# Patient Record
Sex: Female | Born: 1955 | Race: Black or African American | Hispanic: No | Smoking: Current every day smoker
Health system: Southern US, Community
[De-identification: ages and names within clinical notes are randomized; demographics above are authoritative.]

## PROBLEM LIST (undated history)

## (undated) DIAGNOSIS — E785 Hyperlipidemia, unspecified: Secondary | ICD-10-CM

## (undated) DIAGNOSIS — E119 Type 2 diabetes mellitus without complications: Secondary | ICD-10-CM

## (undated) DIAGNOSIS — N6019 Diffuse cystic mastopathy of unspecified breast: Secondary | ICD-10-CM

## (undated) DIAGNOSIS — H269 Unspecified cataract: Secondary | ICD-10-CM

## (undated) DIAGNOSIS — I10 Essential (primary) hypertension: Secondary | ICD-10-CM

## (undated) DIAGNOSIS — K219 Gastro-esophageal reflux disease without esophagitis: Secondary | ICD-10-CM

## (undated) DIAGNOSIS — M1990 Unspecified osteoarthritis, unspecified site: Secondary | ICD-10-CM

## (undated) DIAGNOSIS — J449 Chronic obstructive pulmonary disease, unspecified: Secondary | ICD-10-CM

## (undated) HISTORY — DX: Unspecified osteoarthritis, unspecified site: M19.90

## (undated) HISTORY — DX: Type 2 diabetes mellitus without complications: E11.9

## (undated) HISTORY — PX: CHOLECYSTECTOMY: SHX55

## (undated) HISTORY — DX: Chronic obstructive pulmonary disease, unspecified: J44.9

## (undated) HISTORY — PX: UTERINE FIBROID SURGERY: SHX826

## (undated) HISTORY — DX: Hyperlipidemia, unspecified: E78.5

## (undated) HISTORY — DX: Gastro-esophageal reflux disease without esophagitis: K21.9

## (undated) HISTORY — DX: Diffuse cystic mastopathy of unspecified breast: N60.19

## (undated) HISTORY — DX: Unspecified cataract: H26.9

## (undated) HISTORY — PX: GALLBLADDER SURGERY: SHX652

## (undated) HISTORY — PX: COLONOSCOPY: SHX174

## (undated) HISTORY — DX: Essential (primary) hypertension: I10

## (undated) HISTORY — PX: CATARACT EXTRACTION: SUR2

---

## 1998-01-04 ENCOUNTER — Other Ambulatory Visit: Admission: RE | Admit: 1998-01-04 | Discharge: 1998-01-04 | Payer: Self-pay | Admitting: Nephrology

## 1998-01-07 ENCOUNTER — Ambulatory Visit (HOSPITAL_COMMUNITY): Admission: RE | Admit: 1998-01-07 | Discharge: 1998-01-07 | Payer: Self-pay | Admitting: Nephrology

## 1998-01-18 ENCOUNTER — Other Ambulatory Visit: Admission: RE | Admit: 1998-01-18 | Discharge: 1998-01-18 | Payer: Self-pay | Admitting: Nephrology

## 1998-01-21 ENCOUNTER — Ambulatory Visit (HOSPITAL_COMMUNITY): Admission: RE | Admit: 1998-01-21 | Discharge: 1998-01-21 | Payer: Self-pay | Admitting: Nephrology

## 1998-03-01 ENCOUNTER — Ambulatory Visit: Admission: RE | Admit: 1998-03-01 | Discharge: 1998-03-01 | Payer: Self-pay | Admitting: Nephrology

## 1998-10-07 ENCOUNTER — Encounter: Payer: Self-pay | Admitting: Nephrology

## 1998-10-07 ENCOUNTER — Ambulatory Visit (HOSPITAL_COMMUNITY): Admission: RE | Admit: 1998-10-07 | Discharge: 1998-10-07 | Payer: Self-pay | Admitting: Nephrology

## 1999-04-21 ENCOUNTER — Ambulatory Visit (HOSPITAL_COMMUNITY): Admission: RE | Admit: 1999-04-21 | Discharge: 1999-04-21 | Payer: Self-pay | Admitting: Obstetrics and Gynecology

## 1999-04-21 ENCOUNTER — Encounter: Payer: Self-pay | Admitting: Obstetrics and Gynecology

## 1999-04-29 ENCOUNTER — Other Ambulatory Visit: Admission: RE | Admit: 1999-04-29 | Discharge: 1999-04-29 | Payer: Self-pay | Admitting: Obstetrics and Gynecology

## 2001-08-26 ENCOUNTER — Other Ambulatory Visit: Admission: RE | Admit: 2001-08-26 | Discharge: 2001-08-26 | Payer: Self-pay | Admitting: Obstetrics and Gynecology

## 2003-06-12 ENCOUNTER — Ambulatory Visit (HOSPITAL_COMMUNITY): Admission: RE | Admit: 2003-06-12 | Discharge: 2003-06-12 | Payer: Self-pay | Admitting: Obstetrics and Gynecology

## 2004-02-15 ENCOUNTER — Encounter: Admission: RE | Admit: 2004-02-15 | Discharge: 2004-02-15 | Payer: Self-pay | Admitting: Obstetrics and Gynecology

## 2004-02-22 ENCOUNTER — Encounter: Admission: RE | Admit: 2004-02-22 | Discharge: 2004-02-22 | Payer: Self-pay | Admitting: Obstetrics and Gynecology

## 2013-02-16 ENCOUNTER — Ambulatory Visit (INDEPENDENT_AMBULATORY_CARE_PROVIDER_SITE_OTHER): Payer: BC Managed Care – PPO | Admitting: Family Medicine

## 2013-02-16 VITALS — BP 124/78 | HR 94 | Temp 98.0°F | Resp 17 | Ht 66.0 in | Wt 200.0 lb

## 2013-02-16 DIAGNOSIS — R51 Headache: Secondary | ICD-10-CM

## 2013-02-16 DIAGNOSIS — E1165 Type 2 diabetes mellitus with hyperglycemia: Secondary | ICD-10-CM

## 2013-02-16 DIAGNOSIS — E118 Type 2 diabetes mellitus with unspecified complications: Secondary | ICD-10-CM

## 2013-02-16 DIAGNOSIS — R519 Headache, unspecified: Secondary | ICD-10-CM

## 2013-02-16 DIAGNOSIS — E119 Type 2 diabetes mellitus without complications: Secondary | ICD-10-CM | POA: Insufficient documentation

## 2013-02-16 DIAGNOSIS — H00013 Hordeolum externum right eye, unspecified eyelid: Secondary | ICD-10-CM

## 2013-02-16 DIAGNOSIS — H00019 Hordeolum externum unspecified eye, unspecified eyelid: Secondary | ICD-10-CM

## 2013-02-16 DIAGNOSIS — IMO0002 Reserved for concepts with insufficient information to code with codable children: Secondary | ICD-10-CM

## 2013-02-16 MED ORDER — BUTALBITAL-APAP-CAFFEINE 50-325-40 MG PO TABS: 1.0000 | ORAL_TABLET | Freq: Two times a day (BID) | ORAL | Status: DC | PRN

## 2013-02-16 MED ORDER — AMOXICILLIN 500 MG PO CAPS: 1000.0000 mg | ORAL_CAPSULE | Freq: Two times a day (BID) | ORAL | Status: DC

## 2013-02-16 NOTE — Progress Notes (Signed)
[facility] [address], [place] [address] [phone]  Date:  02/16/2013   Name:  Bailey [name]   DOB:  04-08-1956   MRN:  [id number]  PCP:  No PCP Per Patient    Chief Complaint: Eye Injury   History of Present Illness:  Bailey [name] is a 57 y.o. very pleasant female patient who presents with the following:  She is here today with right eye problem.  This same problem has occurred in the past- about a month or two ago, the pain in her lid went away but the bump remained. Yesterday, the bump in her right upper lid again became more painful and larger.   Vision is ok, she does wear glasses but does not have them with her today.   She does not have any crusting or discharge, except occasionally she will note slight blurred vision that she can blink away.   Left eye is ok.  She has a history of DM and HTN.    She is looking for a new PCP right now.  Her A1c was around 11 last time it was checked. She takes metformin 1gm BID.  She would like to start exercising more to improve her DM control.    She notes HA every other day for 3 or 4 months.  She has used [facility] powders for a couple of days, but usually just uses tylenol.  Sometimes the HA can be severe and difficult to get rid of, and are often on one side of her head.    There are no active problems to display for this patient.   Past Medical History  Diagnosis Date  . Diabetes mellitus without complication   . Hypertension     Past Surgical History  Procedure Laterality Date  . Cholecystectomy      History  Substance Use Topics  . Smoking status: Current Every Day Smoker -- 1.00 packs/day for 20 years    Types: Cigarettes  . Smokeless tobacco: Not on file  . Alcohol Use: No    History reviewed. No pertinent family history.  No Known Allergies  Medication list has been reviewed and updated.  No current outpatient prescriptions on file prior to visit.   No current  facility-administered medications on file prior to visit.    Review of Systems:  As per HPI- otherwise negative.   Physical Examination: Filed Vitals:   02/16/13 1053  BP: 124/78  Pulse: 94  Temp: 98 F (36.7 C)  Resp: 17   Filed Vitals:   02/16/13 1053  Height: 5\' 6"  (1.676 m)  Weight: 200 lb (90.719 kg)   Body mass index is 32.3 kg/(m^2). Ideal Body Weight: Weight in (lb) to have BMI = 25: 154.6  GEN: WDWN, NAD, Non-toxic, A & O x 3, overweight HEENT: Atraumatic, Normocephalic. Neck supple. No masses, No LAD. PEERL, EOMI, conjunctivae wnl,.  There is a tender bump on the mid upper lid, which appears consistent with a stye Ears and Nose: No external deformity. CV: RRR, No M/G/R. No JVD. No thrill. No extra heart sounds. PULM: CTA B, no wheezes, crackles, rhonchi. No retractions. No resp. distress. No accessory muscle use. ABD: S, NT, ND, +BS. No rebound. No HSM. EXTR: No c/c/e NEURO Normal gait. Normal UE and LE strength, sensation and DTR.  Normal  PSYCH: Normally interactive. Conversant. Not depressed or anxious appearing.  Calm demeanor.   Results for orders placed in visit on 02/16/13  POCT  GLYCOSYLATED HEMOGLOBIN (HGB A1C)      Result Value Range   Hemoglobin A1C 8.3      Assessment and Plan: Stye, right - Plan: amoxicillin (AMOXIL) 500 MG capsule  Type II or unspecified type diabetes mellitus with unspecified complication, uncontrolled - Plan: POCT glycosylated hemoglobin (Hb A1C)  Chronic headaches - Plan: butalbital-acetaminophen-caffeine (FIORICET, ESGIC) 50-325-40 MG per tablet  Will treat for persistent stye with oral amoxicillin and hot compresses.  If not better please RTC.    DM: her control is not at goal, but sounds like it is improved.  She will work on more exercise, recheck in 3 months.   Chronic HA; she can try fioricet on occasion as needed.    Signed Abbe Amsterdam, MD

## 2013-02-16 NOTE — Patient Instructions (Addendum)
We are going to treat you for a stye with hot compresses and an oral antibiotic.  Take the amoxicillin twice a day for 7- 10 days. If your stye is not better in the next 2 days please let us know- Sooner if worse.   Use the Fioricet as needed for headache.  Remember it can cause mild sedation so do not use it when you need to drive.  Also, it contains tylenol so do not take any additional tylenol along with this

## 2013-03-10 ENCOUNTER — Ambulatory Visit: Payer: BC Managed Care – PPO

## 2013-03-10 ENCOUNTER — Ambulatory Visit (INDEPENDENT_AMBULATORY_CARE_PROVIDER_SITE_OTHER): Payer: BC Managed Care – PPO | Admitting: Family Medicine

## 2013-03-10 VITALS — BP 145/86 | HR 98 | Temp 98.1°F | Resp 16 | Wt 198.4 lb

## 2013-03-10 DIAGNOSIS — M7702 Medial epicondylitis, left elbow: Secondary | ICD-10-CM

## 2013-03-10 DIAGNOSIS — M25529 Pain in unspecified elbow: Secondary | ICD-10-CM

## 2013-03-10 DIAGNOSIS — M25522 Pain in left elbow: Secondary | ICD-10-CM

## 2013-03-10 DIAGNOSIS — R519 Headache, unspecified: Secondary | ICD-10-CM

## 2013-03-10 MED ORDER — TRAMADOL HCL 50 MG PO TABS: 50.0000 mg | ORAL_TABLET | Freq: Three times a day (TID) | ORAL | Status: DC | PRN

## 2013-03-10 MED ORDER — BUTALBITAL-APAP-CAFFEINE 50-325-40 MG PO TABS: 1.0000 | ORAL_TABLET | Freq: Two times a day (BID) | ORAL | Status: DC | PRN

## 2013-03-10 MED ORDER — MELOXICAM 7.5 MG PO TABS: 15.0000 mg | ORAL_TABLET | Freq: Every day | ORAL | Status: DC

## 2013-03-10 NOTE — Progress Notes (Signed)
[facility]:  Office Visit  Chief Complaint:  Chief Complaint  Patient presents with  . Elbow Pain    unable to bend or move elbow    HPI: Bailey [name] is a 57 y.o. female who complains of  Left elbow pain x 2 days. Constant throbbing. NKI. She is right handed. No repetitive motion. 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 She has had a h.o carpal tunnel but not for a long time. She took her fioricet for HAs.  This helped some. No prior elbow injuries/similar sxs.   Prior PCP was Nicholos Johns  Past Medical History  Diagnosis Date  . Diabetes mellitus without complication   . Hypertension    Past Surgical History  Procedure Laterality Date  . Cholecystectomy     History   Social History  . Marital Status: Married    Spouse Name: N/A    Number of Children: N/A  . Years of Education: N/A   Social History Main Topics  . Smoking status: Current Every Day Smoker -- 1.00 packs/day for 20 years    Types: Cigarettes  . Smokeless tobacco: None  . Alcohol Use: No  . Drug Use: No  . Sexually Active: Yes    Birth Control/ Protection: None   Other Topics Concern  . None   Social History Narrative  . None   History reviewed. No pertinent family history. No Known Allergies Prior to Admission medications   Medication Sig Start Date End Date Taking? Authorizing Provider  lisinopril (PRINIVIL,ZESTRIL) 20 MG tablet Take 20 mg by mouth daily.   Yes Historical Provider, MD  metFORMIN (GLUCOPHAGE) 500 MG tablet Take 500 mg by mouth 2 (two) times daily with a meal.   Yes Historical Provider, MD  amoxicillin (AMOXIL) 500 MG capsule Take 2 capsules (1,000 mg total) by mouth 2 (two) times daily. 02/16/13   Pearline Cables, MD  butalbital-acetaminophen-caffeine (FIORICET, ESGIC) [phone] MG per tablet Take 1 tablet by mouth 2 (two) times daily as needed for headache. 02/16/13   Gwenlyn Found Copland, MD     ROS: The patient denies fevers,  chills, night sweats, unintentional weight loss, chest pain, palpitations, wheezing, dyspnea on exertion, nausea, vomiting, abdominal pain, dysuria, hematuria, melena, numbness, weakness, or tingling.  All other systems have been reviewed and were otherwise negative with the exception of those mentioned in the HPI and as above.    PHYSICAL EXAM: Filed Vitals:   03/10/13 0844  BP: 145/86  Pulse: 98  Temp: 98.1 F (36.7 C)  Resp: 16   Filed Vitals:   03/10/13 0844  Weight: 198 lb 6.4 oz (89.994 kg)   Body mass index is 32.04 kg/(m^2).  General: Alert, no acute distress HEENT:  Normocephalic, atraumatic, oropharynx patent.  Cardiovascular:  Regular rate and rhythm, no rubs murmurs or gallops.  No Carotid bruits, radial pulse intact. No pedal edema.  Respiratory: Clear to auscultation bilaterally.  No wheezes, rales, or rhonchi.  No cyanosis, no use of accessory musculature GI: No organomegaly, abdomen is soft and non-tender, positive bowel sounds.  No masses. Skin: No rashes. Neurologic: Facial musculature symmetric. Psychiatric: Patient is appropriate throughout our interaction. Lymphatic: No cervical lymphadenopathy Musculoskeletal: Gait intact. Neck and Left Shoulder exam normal + tender medial epicondyle, decrease ROM due to pain, + radial pulse, 5/5 strength, sensation intact  LABS: Results for orders placed in visit on 02/16/13  POCT GLYCOSYLATED HEMOGLOBIN (HGB A1C)  Result Value Range   Hemoglobin A1C 8.3       EKG/XRAY:   Primary read interpreted by Dr. Conley Rolls at [facility]. Neg fracture/dislocation   ASSESSMENT/PLAN: Encounter Diagnoses  Name Primary?  . Left elbow pain Yes  . Golfer's elbow, left   . Epicondylitis elbow, medial, left    No e/o infection/sepsis/or olecranon bursitis Most likely medial epicondylitis due to sprain/strain Rx mobic 7.5 mg Rx tramadol Patient did not feel good with sling so declined Refilled fioricet for HA Gross sideeffects,  risk and benefits, and alternatives of medications d/w patient. Patient is aware that all medications have potential sideeffects and we are unable to predict every sideeffect or drug-drug interaction that may occur. F/u prn   Jaydalee Bardwell PHUONG, DO 03/10/2013 9:46 AM

## 2013-03-10 NOTE — Patient Instructions (Signed)
Medial Epicondylitis (Golfer's Elbow)  with Rehab  Medial epicondylitis involves inflammation and pain around the inner (medial) portion of the elbow. This pain is caused by inflammation of the tendons in the forearm that flex (bring down) the wrist. Medial epicondylitis is also called golfer's elbow, because it is common among golfers. However, it may occur in any individual who flexes the wrist regularly. If medial epicondylitis is left untreated, it may become a chronic problem.  SYMPTOMS   · Pain, tenderness, or inflammation over the inner (medial) side of the elbow.  · Pain or weakness with gripping activities.  · Pain that increases with wrist twisting motions (using a screwdriver, playing golf, bowling).  CAUSES   Medial epicondylitis is caused by inflammation of the tendons that flex the wrist. Causes of injury may include:  · Chronic, repetitive stress and strain to the tendons that run from the wrist and forearm to the elbow.  · Sudden strain on the forearm, including wrist snap when serving balls with racquet sports, or throwing a baseball.  RISK INCREASES WITH:  · Sports or occupations that require repetitive and/or strenuous forearm and wrist movements (pitching a baseball, golfing, carpentry).  · Poor wrist and forearm strength and flexibility.  · Failure to warm up properly before activity.  · Resuming activity before healing, rehabilitation, and conditioning are complete.  PREVENTION   · Warm up and stretch properly before activity.  · Maintain physical fitness:  · Strength, flexibility, and endurance.  · Cardiovascular fitness.  · Wear and use properly fitted equipment.  · Learn and use proper technique and have a coach correct improper technique.  · Wear a tennis elbow (counterforce) brace.  PROGNOSIS   The course of this condition depends on the degree of the injury. If treated properly, acute cases (symptoms lasting less than 4 weeks) are often resolved in 2 to 6 weeks. Chronic (longer lasting  cases) often resolve in 3 to 6 months, but may require physical therapy.  RELATED COMPLICATIONS   · Frequently recurring symptoms, resulting in a chronic problem. Properly treating the problem the first time decreases frequency of recurrence.  · Chronic inflammation, scarring, and partial tendon tear, requiring surgery.  · Delayed healing or resolution of symptoms.  TREATMENT   Treatment first involves the use of ice and medicine, to reduce pain and inflammation. Strengthening and stretching exercises may reduce discomfort, if performed regularly. These exercises may be performed at home, if the condition is an acute injury. Chronic cases may require a referral to a physical therapist for evaluation and treatment. Your caregiver may advise a corticosteroid injection to help reduce inflammation. Rarely, surgery is needed.  MEDICATION  · If pain medicine is needed, nonsteroidal anti-inflammatory medicines (aspirin and ibuprofen), or other minor pain relievers (acetaminophen), are often advised.  · Do not take pain medicine for 7 days before surgery.  · Prescription pain relievers may be given, if your caregiver thinks they are needed. Use only as directed and only as much as you need.  · Corticosteroid injections may be recommended. These injections should be reserved only for the most severe cases, because they can only be given a certain number of times.  HEAT AND COLD  · Cold treatment (icing) should be applied for 10 to 15 minutes every 2 to 3 hours for inflammation and pain, and immediately after activity that aggravates your symptoms. Use ice packs or an ice massage.  · Heat treatment may be used before performing stretching and strengthening activities   prescribed by your caregiver, physical therapist, or athletic trainer. Use a heat pack or a warm water soak.  SEEK MEDICAL CARE IF:  Symptoms get worse or do not improve in 2 weeks, despite treatment.  EXERCISES   RANGE OF MOTION (ROM) AND STRETCHING EXERCISES -  Epicondylitis, Medial (Golfer's Elbow)  These exercises may help you when beginning to rehabilitate your injury. Your symptoms may go away with or without further involvement from your physician, physical therapist or athletic trainer. While completing these exercises, remember:   · Restoring tissue flexibility helps normal motion to return to the joints. This allows healthier, less painful movement and activity.  · An effective stretch should be held for at least 30 seconds.  · A stretch should never be painful. You should only feel a gentle lengthening or release in the stretched tissue.  RANGE OF MOTION  Wrist Flexion, Active-Assisted  · Extend your right / left elbow with your fingers pointing down.*  · Gently pull the back of your hand towards you, until you feel a gentle stretch on the top of your forearm.  · Hold this position for __________ seconds.  Repeat __________ times. Complete this exercise __________ times per day.   *If directed by your physician, physical therapist or athletic trainer, complete this stretch with your elbow bent, rather than extended.  RANGE OF MOTION  Wrist Extension, Active-Assisted  · Extend your right / left elbow and turn your palm upwards.*  · Gently pull your palm and fingertips back, so your wrist extends and your fingers point more toward the ground.  · You should feel a gentle stretch on the inside of your forearm.  · Hold this position for __________ seconds.  Repeat __________ times. Complete this exercise __________ times per day.  *If directed by your physician, physical therapist or athletic trainer, complete this stretch with your elbow bent, rather than extended.  STRETCH  Wrist Extension   · Place your right / left fingertips on a tabletop leaving your elbow slightly bent. Your fingers should point backwards.  · Gently press your fingers and palm down onto the table, by straightening your elbow. You should feel a stretch on the inside of your forearm.  · Hold this  position for __________ seconds.  Repeat __________ times. Complete this stretch __________ times per day.   STRENGTHENING EXERCISES - Epicondylitis, Medial (Golfer's Elbow)  These exercises may help you when beginning to rehabilitate your injury. They may resolve your symptoms with or without further involvement from your physician, physical therapist or athletic trainer. While completing these exercises, remember:   · Muscles can gain both the endurance and the strength needed for everyday activities through controlled exercises.  · Complete these exercises as instructed by your physician, physical therapist or athletic trainer. Increase the resistance and repetitions only as guided.  · You may experience muscle soreness or fatigue, but the pain or discomfort you are trying to eliminate should never worsen during these exercises. If this pain does get worse, stop and make sure you are following the directions exactly. If the pain is still present after adjustments, discontinue the exercise until you can discuss the trouble with your caregiver.  STRENGTH Wrist Flexors  · Sit with your right / left forearm palm-up, and fully supported on a table or countertop. Your elbow should be resting below the height of your shoulder. Allow your wrist to extend over the edge of the surface.  · Loosely holding a __________ weight, or a piece   of rubber exercise band or tubing, slowly curl your hand up toward your forearm.  · Hold this position for __________ seconds. Slowly lower the wrist back to the starting position in a controlled manner.  Repeat __________ times. Complete this exercise __________ times per day.   STRENGTH  Wrist Extensors  · Sit with your right / left forearm palm-down and fully supported. Your elbow should be resting below the height of your shoulder. Allow your wrist to extend over the edge of the surface.  · Loosely holding a __________ weight, or a piece of rubber exercise band or tubing, slowly curl  your hand up toward your forearm.  · Hold this position for __________ seconds. Slowly lower the wrist back to the starting position in a controlled manner.  Repeat __________ times. Complete this exercise __________ times per day.   STRENGTH - Ulnar Deviators  · Stand with a ____________________ weight in your right / left hand, or sit while holding a rubber exercise band or tubing, with your healthy arm supported on a table or countertop.  · Move your wrist so that your pinkie travels toward your forearm and your thumb moves away from your forearm.  · Hold this position for __________ seconds and then slowly lower the wrist back to the starting position.  Repeat __________ times. Complete this exercise __________ times per day  STRENGTH - Grip   · Grasp a tennis ball, a dense sponge, or a large, rolled sock in your hand.  · Squeeze as hard as you can, without increasing any pain.  · Hold this position for __________ seconds. Release your grip slowly.  Repeat __________ times. Complete this exercise __________ times per day.   STRENGTH  Forearm Supinators   · Sit with your right / left forearm supported on a table, keeping your elbow below shoulder height. Rest your hand over the edge, palm down.  · Gently grip a hammer or a soup ladle.  · Without moving your elbow, slowly turn your palm and hand upward to a "thumbs-up" position.  · Hold this position for __________ seconds. Slowly return to the starting position.  Repeat __________ times. Complete this exercise __________ times per day.   STRENGTH  Forearm Pronators  · Sit with your right / left forearm supported on a table, keeping your elbow below shoulder height. Rest your hand over the edge, palm up.  · Gently grip a hammer or a soup ladle.  · Without moving your elbow, slowly turn your palm and hand upward to a "thumbs-up" position.  · Hold this position for __________ seconds. Slowly return to the starting position.  Repeat __________ times. Complete this  exercise __________ times per day.   Document Released: 09/07/2005 Document Revised: 11/30/2011 Document Reviewed: 12/20/2008  [employer]® Patient Information ©2014 [employer].

## 2013-05-03 ENCOUNTER — Telehealth: Payer: Self-pay

## 2013-05-03 MED ORDER — METFORMIN HCL 500 MG PO TABS: 1000.0000 mg | ORAL_TABLET | Freq: Two times a day (BID) | ORAL | Status: DC

## 2013-05-03 NOTE — Telephone Encounter (Signed)
Bailey [name] is requesting a refill on pt's Metformin 500 mg #120.

## 2013-05-03 NOTE — Telephone Encounter (Signed)
Rx sent to pharmacy. Per note it looks like she takes 2 tablets twice daily which is what I refilled. Please verify this and let her know she will need an OV for additional refills

## 2013-05-04 NOTE — Telephone Encounter (Signed)
Called patient. Advised her she is due for follow up.

## 2013-05-11 ENCOUNTER — Ambulatory Visit: Payer: BC Managed Care – PPO

## 2013-05-11 ENCOUNTER — Ambulatory Visit (INDEPENDENT_AMBULATORY_CARE_PROVIDER_SITE_OTHER): Payer: BC Managed Care – PPO | Admitting: Physician Assistant

## 2013-05-11 VITALS — BP 144/84 | HR 112 | Temp 98.2°F | Resp 16 | Ht 65.5 in | Wt 194.0 lb

## 2013-05-11 DIAGNOSIS — J40 Bronchitis, not specified as acute or chronic: Secondary | ICD-10-CM

## 2013-05-11 DIAGNOSIS — E1059 Type 1 diabetes mellitus with other circulatory complications: Secondary | ICD-10-CM

## 2013-05-11 DIAGNOSIS — R05 Cough: Secondary | ICD-10-CM

## 2013-05-11 DIAGNOSIS — E119 Type 2 diabetes mellitus without complications: Secondary | ICD-10-CM

## 2013-05-11 LAB — POCT CBC
Granulocyte percent: 72.9 %G (ref 37–80)
HCT, POC: 43.1 % (ref 37.7–47.9)
Hemoglobin: 13.3 g/dL (ref 12.2–16.2)
Lymph, poc: 2.1 (ref 0.6–3.4)
MCHC: 30.9 g/dL — AB (ref 31.8–35.4)
MID (cbc): 0.7 (ref 0–0.9)
POC Granulocyte: 7.6 — AB (ref 2–6.9)
POC LYMPH PERCENT: 20.3 %L (ref 10–50)
POC MID %: 6.8 %M (ref 0–12)
Platelet Count, POC: 269 10*3/uL (ref 142–424)
RBC: 5.62 M/uL — AB (ref 4.04–5.48)
RDW, POC: 18.4 %

## 2013-05-11 LAB — GLUCOSE, POCT (MANUAL RESULT ENTRY): POC Glucose: 247 mg/dl — AB (ref 70–99)

## 2013-05-11 MED ORDER — AZITHROMYCIN 500 MG PO TABS: 500.0000 mg | ORAL_TABLET | Freq: Every day | ORAL | Status: DC

## 2013-05-11 MED ORDER — METFORMIN HCL 500 MG PO TABS: 1000.0000 mg | ORAL_TABLET | Freq: Two times a day (BID) | ORAL | Status: DC

## 2013-05-11 MED ORDER — HYDROCODONE-HOMATROPINE 5-1.5 MG/5ML PO SYRP: ORAL_SOLUTION | ORAL | Status: DC

## 2013-05-11 NOTE — Progress Notes (Signed)
Patient ID: EVADNE OSE MRN: [id number], DOB: 1956-01-17, 57 y.o. Date of Encounter: 05/11/2013, 11:42 AM  Primary Physician: No PCP Per Patient  Chief Complaint: Cough x 3 days  HPI: 57 y.o. female with history below presents with a cough x 3 days. Subjective fever and chills. Cough was initially more productive, however now it seems to be more dry. Cough is not associated with time of day. No associated SOB or wheezing. Mild nasal congestion. No sinus pressure or otalgia. Symptoms began with a one day history of sore throat, that has since resolved. She also notes a decreased appetite. Has been pushing fluids. Has eaten some watermelon over the past couple of days.    Past Medical History  Diagnosis Date  . Diabetes mellitus without complication   . Hypertension      Home Meds: Prior to Admission medications   Medication Sig Start Date End Date Taking? Authorizing Provider         butalbital-acetaminophen-caffeine (FIORICET, ESGIC) 50-325-40 MG per tablet Take 1 tablet by mouth 2 (two) times daily as needed for headache. 03/10/13  Yes Thao P Le, DO  lisinopril (PRINIVIL,ZESTRIL) 20 MG tablet Take 20 mg by mouth daily.   Yes Historical Provider, MD  meloxicam (MOBIC) 7.5 MG tablet Take 2 tablets (15 mg total) by mouth daily. Take with food. No other NSAIDs 03/10/13  Yes Thao P Le, DO  metFORMIN (GLUCOPHAGE) 500 MG tablet Take 2 tablets (1,000 mg total) by mouth 2 (two) times daily with a meal. Needs office visit 05/03/13  Yes Heather M Marte, PA-C  traMADol (ULTRAM) 50 MG tablet Take 1 tablet (50 mg total) by mouth every 8 (eight) hours as needed for pain. 03/10/13  Yes Thao P Le, DO    Allergies: No Known Allergies  History   Social History  . Marital Status: Married    Spouse Name: N/A    Number of Children: N/A  . Years of Education: N/A   Occupational History  . Not on file.   Social History Main Topics  . Smoking status: Current Every Day Smoker -- 1.00 packs/day  for 20 years    Types: Cigarettes  . Smokeless tobacco: Not on file  . Alcohol Use: No  . Drug Use: No  . Sexual Activity: Yes    Birth Control/ Protection: None   Other Topics Concern  . Not on file   Social History Narrative  . No narrative on file     Review of Systems: Constitutional: positive for fever, chills, and fatigue  HEENT: see above Cardiovascular: negative for chest pain or palpitations Respiratory: positive for cough. negative for hemoptysis, wheezing, or shortness of breath Abdominal: negative for abdominal pain, nausea, vomiting, diarrhea, or constipation Dermatological: negative for rash Neurologic: positive for dizziness. negative for headache or syncope   Physical Exam: Blood pressure 144/84, pulse 112, temperature 98.2 F (36.8 C), temperature source Oral, resp. rate 16, height 5' 5.5" (1.664 m), weight 194 lb (87.998 kg), SpO2 94.00%., Body mass index is 31.78 kg/(m^2). General: Well developed, well nourished, in no acute distress. Head: Normocephalic, atraumatic, eyes without discharge, sclera non-icteric, nares are without discharge. Bilateral auditory canals clear, TM's are without perforation, pearly grey and translucent with reflective cone of light bilaterally. Oral cavity moist, posterior pharynx without exudate, erythema, peritonsillar abscess, or post nasal drip.  Neck: Supple. No thyromegaly. Full ROM. No lymphadenopathy. Lungs: Coarse breath sounds bilaterally without wheezes, rales, or rhonchi. Breathing is unlabored. Heart: RRR with S1  S2. No murmurs, rubs, or gallops appreciated. Msk:  Strength and tone normal for age. Extremities/Skin: Warm and dry. No clubbing or cyanosis. No edema. No rashes or suspicious lesions. Neuro: Alert and oriented X 3. Moves all extremities spontaneously. Gait is normal. CNII-XII grossly in tact. Psych:  Responds to questions appropriately with a normal affect.   Labs: Results for orders placed in visit on  05/11/13  POCT CBC      Result Value Range   WBC 10.4 (*) 4.6 - 10.2 K/uL   Lymph, poc 2.1  0.6 - 3.4   POC LYMPH PERCENT 20.3  10 - 50 %L   MID (cbc) 0.7  0 - 0.9   POC MID % 6.8  0 - 12 %M   POC Granulocyte 7.6 (*) 2 - 6.9   Granulocyte percent 72.9  37 - 80 %G   RBC 5.62 (*) 4.04 - 5.48 M/uL   Hemoglobin 13.3  12.2 - 16.2 g/dL   HCT, POC 60.4  54.0 - 47.9 %   MCV 76.7 (*) 80 - 97 fL   MCH, POC 23.7 (*) 27 - 31.2 pg   MCHC 30.9 (*) 31.8 - 35.4 g/dL   RDW, POC 98.1     Platelet Count, POC 269  142 - 424 K/uL   MPV 9.7  0 - 99.8 fL  GLUCOSE, POCT (MANUAL RESULT ENTRY)      Result Value Range   POC Glucose 247 (*) 70 - 99 mg/dl  POCT GLYCOSYLATED HEMOGLOBIN (HGB A1C)      Result Value Range   Hemoglobin A1C 7.9     Prior A1C: 8.3 on 02/16/13  CXR: [facility] reading (PRIMARY) by  Dr. Perrin Maltese. Increased markings without discrete infiltrate.    ASSESSMENT AND PLAN:  56 y.o. female with bronchitis/cough and diabetes mellitus  1) Bronchitis/cough -Azithromycin 500 mg 1 po daily #5 no RF -Hycodan #4oz 1 tsp po q 4-6 hours prn cough no RF SED -Rest -Fluids  2) Diabetes mellitus -Continue metformin 1000 mg 1 po bid #120 RF 2 -Healthy diet and exercise -Weight loss   Signed, Eula Listen, PA-C 05/11/2013 11:42 AM

## 2013-07-22 ENCOUNTER — Ambulatory Visit: Payer: BC Managed Care – PPO

## 2013-07-22 ENCOUNTER — Ambulatory Visit (INDEPENDENT_AMBULATORY_CARE_PROVIDER_SITE_OTHER): Payer: BC Managed Care – PPO | Admitting: Emergency Medicine

## 2013-07-22 VITALS — BP 158/82 | HR 102 | Temp 97.6°F | Resp 20 | Ht 65.5 in | Wt 194.0 lb

## 2013-07-22 DIAGNOSIS — M25551 Pain in right hip: Secondary | ICD-10-CM

## 2013-07-22 DIAGNOSIS — M7700 Medial epicondylitis, unspecified elbow: Secondary | ICD-10-CM

## 2013-07-22 DIAGNOSIS — M549 Dorsalgia, unspecified: Secondary | ICD-10-CM

## 2013-07-22 DIAGNOSIS — M25559 Pain in unspecified hip: Secondary | ICD-10-CM

## 2013-07-22 DIAGNOSIS — M7702 Medial epicondylitis, left elbow: Secondary | ICD-10-CM

## 2013-07-22 MED ORDER — TRAMADOL HCL 50 MG PO TABS: 50.0000 mg | ORAL_TABLET | Freq: Three times a day (TID) | ORAL | Status: DC | PRN

## 2013-07-22 MED ORDER — MELOXICAM 7.5 MG PO TABS: 15.0000 mg | ORAL_TABLET | Freq: Every day | ORAL | Status: DC

## 2013-07-22 NOTE — Progress Notes (Signed)
This chart was scribed for Collene Gobble, MD by Caryn Bee, Medical Scribe. This patient was seen in Room/bed 03 and the patient's care was started at 10:11 AM.  Subjective:    Patient ID: Bailey [name], female    DOB: May 20, 1956, 57 y.o.   MRN: [id number]  HPI HPI Comments: Bailey [name] is a 57 y.o. female who presents to [facility] complaining of sudden onset moderate right hip pain that radiates to her neck and back, onset about 2 days ago. 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 She denies any recent injury to the hip. Pt reports lower back pain radiated from her hip. She also reports right leg pain associated with the hip pain. She has taken OTC tylenol with mild relief. She has h/o similar symptoms to the left hip. 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      Review of Systems  Musculoskeletal: Positive for arthralgias (Right hip), back pain (Lower back) and neck pain.   Past Surgical History  Procedure Laterality Date   Cholecystectomy     History   Social History   Marital Status: Married    Spouse Name: N/A    Number of Children: N/A   Years of Education: N/A   Occupational History   Not on file.   Social History Main Topics   Smoking status: Current Every Day Smoker -- 1.00 packs/day for 20 years    Types: Cigarettes   Smokeless tobacco: Not on file   Alcohol Use: No   Drug Use: No   Sexual Activity: Yes    Birth Control/ Protection: None   Other Topics Concern   Not on file   Social History Narrative   No narrative on file   Past Medical History  Diagnosis Date   Diabetes mellitus without complication    Hypertension    History reviewed. No pertinent family history. No Known Allergies      Objective:   Physical Exam there is tenderness over the back of the neck. There is tenderness to underside of the jaw and right trapezius area. She has normal strength of the right upper extremity.  Chest clear heart rate no murmurs examination of the right heel reveals no definite tenderness over the greater trochanter. There is tenderness over the buttocks and right L5-S1 area.    [facility] reading (PRIMARY) by  Dr. Cleta Alberts there is C5-6 C6-7 degenerative disc disease. LS-spine films show L5-S1 facet arthropathy. The right hip itself looks normal      Assessment & Plan:  Patient placed on nonsteroidals as well as Ultram. Referral made to [facility].

## 2013-09-26 ENCOUNTER — Other Ambulatory Visit: Payer: Self-pay | Admitting: Physician Assistant

## 2013-09-26 ENCOUNTER — Ambulatory Visit (INDEPENDENT_AMBULATORY_CARE_PROVIDER_SITE_OTHER): Payer: BC Managed Care – PPO | Admitting: Emergency Medicine

## 2013-09-26 ENCOUNTER — Other Ambulatory Visit: Payer: Self-pay | Admitting: Emergency Medicine

## 2013-09-26 ENCOUNTER — Ambulatory Visit
Admission: RE | Admit: 2013-09-26 | Discharge: 2013-09-26 | Disposition: A | Discharge status: Home / Self Care | Payer: BC Managed Care – PPO | Source: Ambulatory Visit | Attending: Emergency Medicine | Admitting: Emergency Medicine

## 2013-09-26 VITALS — BP 126/80 | HR 102 | Temp 98.1°F | Resp 16 | Ht 65.75 in | Wt 197.0 lb

## 2013-09-26 DIAGNOSIS — H5711 Ocular pain, right eye: Secondary | ICD-10-CM

## 2013-09-26 DIAGNOSIS — H5710 Ocular pain, unspecified eye: Secondary | ICD-10-CM

## 2013-09-26 DIAGNOSIS — R519 Headache, unspecified: Secondary | ICD-10-CM

## 2013-09-26 DIAGNOSIS — R51 Headache: Secondary | ICD-10-CM

## 2013-09-26 DIAGNOSIS — E119 Type 2 diabetes mellitus without complications: Secondary | ICD-10-CM

## 2013-09-26 LAB — POCT CBC
Granulocyte percent: 52.6 %G (ref 37–80)
HCT, POC: 45.3 % (ref 37.7–47.9)
HEMOGLOBIN: 14 g/dL (ref 12.2–16.2)
Lymph, poc: 3.4 (ref 0.6–3.4)
MCH: 23.2 pg — AB (ref 27–31.2)
MCHC: 30.9 g/dL — AB (ref 31.8–35.4)
MCV: 75 fL — AB (ref 80–97)
MID (cbc): 0.5 (ref 0–0.9)
MPV: 9.4 fL (ref 0–99.8)
PLATELET COUNT, POC: 299 10*3/uL (ref 142–424)
POC Granulocyte: 4.3 (ref 2–6.9)
POC LYMPH PERCENT: 41.8 %L (ref 10–50)
POC MID %: 5.6 %M (ref 0–12)
RBC: 6.04 M/uL — AB (ref 4.04–5.48)
RDW, POC: 18.3 %
WBC: 8.2 10*3/uL (ref 4.6–10.2)

## 2013-09-26 LAB — COMPREHENSIVE METABOLIC PANEL
ALBUMIN: 4.2 g/dL (ref 3.5–5.2)
ALT: 26 U/L (ref 0–35)
AST: 19 U/L (ref 0–37)
Alkaline Phosphatase: 125 U/L — ABNORMAL HIGH (ref 39–117)
BUN: 9 mg/dL (ref 6–23)
CALCIUM: 9.6 mg/dL (ref 8.4–10.5)
CHLORIDE: 100 meq/L (ref 96–112)
CO2: 22 meq/L (ref 19–32)
Creat: 0.43 mg/dL — ABNORMAL LOW (ref 0.50–1.10)
Glucose, Bld: 240 mg/dL — ABNORMAL HIGH (ref 70–99)
POTASSIUM: 4.3 meq/L (ref 3.5–5.3)
SODIUM: 134 meq/L — AB (ref 135–145)
Total Bilirubin: 0.4 mg/dL (ref 0.3–1.2)
Total Protein: 7.4 g/dL (ref 6.0–8.3)

## 2013-09-26 LAB — POCT GLYCOSYLATED HEMOGLOBIN (HGB A1C): Hemoglobin A1C: 8.2

## 2013-09-26 LAB — LIPID PANEL
CHOL/HDL RATIO: 5.1 ratio
CHOLESTEROL: 306 mg/dL — AB (ref 0–200)
HDL: 60 mg/dL (ref >39)
LDL CALC: 212 mg/dL — AB (ref 0–99)
TRIGLYCERIDES: 172 mg/dL — AB (ref <150)
VLDL: 34 mg/dL (ref 0–40)

## 2013-09-26 MED ORDER — ACETAMINOPHEN-CODEINE #3 300-30 MG PO TABS
1.0000 | ORAL_TABLET | ORAL | Status: DC | PRN
Start: 2013-09-26 — End: 2013-12-04

## 2013-09-26 MED ORDER — LISINOPRIL 20 MG PO TABS: 20.0000 mg | ORAL_TABLET | Freq: Every day | ORAL | Status: DC

## 2013-09-26 MED ORDER — METFORMIN HCL 500 MG PO TABS: 1000.0000 mg | ORAL_TABLET | Freq: Two times a day (BID) | ORAL | Status: DC

## 2013-09-26 MED ORDER — BUTALBITAL-APAP-CAFFEINE 50-325-40 MG PO TABS: 1.0000 | ORAL_TABLET | Freq: Two times a day (BID) | ORAL | Status: DC | PRN

## 2013-09-26 NOTE — Addendum Note (Signed)
Addended by: Roselee Culver on: 09/26/2013 09:43 AM   Modules accepted: Orders

## 2013-09-26 NOTE — Progress Notes (Signed)
[facility] [address] [place] [address] [phone]  Date:  09/26/2013   Name:  Bailey [name]   DOB:  02-13-1956   MRN:  [id number]  PCP:  Lamar Blinks, MD    Chief Complaint: Medication Refill, Headache and Nausea   History of Present Illness:  Bailey [name] is a 58 y.o. very pleasant female patient who presents with the following:  Patient describes a pain behind her right eye since May that has been persistent. Not associated with visual or neuro symptoms.  Says she has a bit of unsteadiness that she attributes to her diabetic neuropathy.  Went to ophthalmologist in May about her eye and he ruled out ophthalmic pathology.  No cough or coryza.  No swelling or conjunctival injection or drainage.  Vision stable with glasses.  No improvement with over the counter medications or other home remedies. Denies other complaint or health concern today.   Patient Active Problem List   Diagnosis Date Noted  . Type II or unspecified type diabetes mellitus with unspecified complication, uncontrolled 02/16/2013    Past Medical History  Diagnosis Date  . Diabetes mellitus without complication   . Hypertension     Past Surgical History  Procedure Laterality Date  . Cholecystectomy      History  Substance Use Topics  . Smoking status: Current Every Day Smoker -- 1.00 packs/day for 20 years    Types: Cigarettes  . Smokeless tobacco: Not on file  . Alcohol Use: No    History reviewed. No pertinent family history.  No Known Allergies  Medication list has been reviewed and updated.  Current Outpatient Prescriptions on File Prior to Visit  Medication Sig Dispense Refill  . HYDROcodone-homatropine (HYCODAN) 5-1.5 MG/5ML syrup 1 TSP PO Q 4-6 HOURS PRN COUGH  120 mL  0  . metFORMIN (GLUCOPHAGE) 500 MG tablet Take 2 tablets (1,000 mg total) by mouth 2 (two) times daily with a meal.  120 tablet  2  . traMADol (ULTRAM) 50 MG tablet Take 1 tablet (50 mg  total) by mouth every 8 (eight) hours as needed for pain.  30 tablet  0  . azithromycin (ZITHROMAX) 500 MG tablet Take 1 tablet (500 mg total) by mouth daily.  5 tablet  0  . butalbital-acetaminophen-caffeine (FIORICET, ESGIC) 50-325-40 MG per tablet Take 1 tablet by mouth 2 (two) times daily as needed for headache.  20 tablet  0  . lisinopril (PRINIVIL,ZESTRIL) 20 MG tablet Take 20 mg by mouth daily.      . meloxicam (MOBIC) 7.5 MG tablet Take 2 tablets (15 mg total) by mouth daily. Take with food. No other NSAIDs  20 tablet  0   No current facility-administered medications on file prior to visit.    Review of Systems:  As per HPI, otherwise negative.    Physical Examination: Filed Vitals:   09/26/13 0845  BP: 126/80  Pulse: 102  Temp: 98.1 F (36.7 C)  Resp: 16   Filed Vitals:   09/26/13 0845  Height: 5' 5.75" (1.67 m)  Weight: 197 lb (89.359 kg)   Body mass index is 32.04 kg/(m^2). Ideal Body Weight: Weight in (lb) to have BMI = 25: 153.4  GEN: WDWN, NAD, Non-toxic, A & O x 3 HEENT: Atraumatic, Normocephalic. Neck supple. No masses, No LAD.  PRRERLA EOMI pinpoint pupils.  Can't see eyegrounds Ears and Nose: No external deformity. CV: RRR, No M/G/R. No JVD. No thrill. No extra heart sounds. PULM:  CTA B, no wheezes, crackles, rhonchi. No retractions. No resp. distress. No accessory muscle use. ABD: S, NT, ND, +BS. No rebound. No HSM. EXTR: No c/c/e  CN 2-12 intact NEURO Normal gait.  PSYCH: Normally interactive. Conversant. Not depressed or anxious appearing.  Calm demeanor.    Assessment and Plan: Retro-orbital pain  CT  Signed,  Ellison Carwin, MD

## 2013-09-26 NOTE — Addendum Note (Signed)
Addended by: Roselee Culver on: 09/26/2013 10:11 AM   Modules accepted: Orders

## 2013-09-26 NOTE — Patient Instructions (Signed)
Diabetic Nephropathy Diabetic nephropathy is a complication of diabetes that leads to damaged kidneys. It develops slowly. The function of healthy kidneys is to filter and clean blood. Kidneys also get rid of body waste products and extra fluid. When the kidney filters are damaged, there is protein loss in the urine, a decline in kidney function, a buildup of kidney waste products and fluid, and high blood pressure. The damage progresses until the kidneys fail.  RISK FACTORS  High blood pressure (hypertension).  High blood sugar (hyperglycemia).  Family history.  Aging.  Obstruction problems affecting the kidneys, the tubes that drain the kidneys (ureters), or the bladder.  Taking certain drugs or medicines. SYMPTOMS  Symptoms may not be seen or felt for many years. You may not notice any signs of kidney failure until your kidneys have lost much of their ability to function. An early sign of damage is when small amounts of protein (albumin) leak into the urine. However, this can only be found through a urine test. Without physical symptoms, a urine test is often not performed. When the kidneys fail, you may feel one or more of the following:  Swelling of the hands and feet from the extra fluid in your body.  Constant upset stomach.  Constant fatigue. DIAGNOSIS When someone has diabetes, screening tests are done to look for any early signs of problems before symptoms develop and before damage has already been done. These tests may include:   Annual urine tests to screen for trace amounts of protein in the urine (microalbuminuria).  Urine collectionover 24 hours to measure kidney function.  Blood tests that measure kidney function. Your caregiver is aware that problems other than diabetes can damage kidneys. If screening tests show early kidney damage, but it is thought that a different problem is causing the damage, other tests may be performed. Examples of these tests include:  An  ultrasound of your kidney.  Taking a tissue sample (biopsy) from the kidney. TREATMENT The goal of treatment is to prevent or slow down damage to your kidneys. Controlling hypertension and hyperglycemia is critical. Your goal is to maintain a blood pressure below 120/80. If you have certain other medical problems, this goal may be different. Talk to your caregiver to make sure that your blood pressure goal is right for your needs. Regular testing of your blood glucose at home is important. Your goal is to have a normal blood glucose (110 or less when fasting) as often as possible.  In addition, maintaining your hemoglobin A1c level at less than 7% reduces your risk for complications, including kidney damage. Common treatments include:  Dieting by controlling what you eat as well as the portion sizes.  Exercising to control blood pressure and blood glucose.  Taking medicines.  Giving yourself insulin injections if your caregiver feels that it is necessary.  Getting early treatment for urinary tract infections.  Regularly following up with your caregiver. If your disease progresses to end-stage kidney failure, you will need dialysis or a transplant. Dialysis can be done in 1 of 2 ways:  Hemodialysis. Your blood flows from a tube in your arm through a machine. The machine filters waste and extra fluid. The clean blood flows back into your arm.  Peritoneal dialysis. Your abdomen is filled with a special fluid. The fluid collects waste products and extra fluid from your blood. The fluid is then drained from your abdomen and discarded. SEEK MEDICAL CARE IF:   You are having problems keeping your blood  glucose in the goal range.  You have swelling of the hands or feet.  You have weakness.  You have muscles spasms.  You have a constant upset stomach.  You feel tired all the time and this is not normal for you. SEEK IMMEDIATE MEDICAL CARE IF:  You have unusual dizziness or  weakness.  You have excessive sleepiness.  You have a seizure or convulsion.  You have severe, painful muscle spasms.  You have shortness of breath or trouble breathing.  You pass out or have a fainting episode.  You have chest pains. MAKE SURE YOU:  Understand these instructions.  Will watch your condition.  Will get help right away if you are not doing well or get worse. Document Released: 09/27/2007 Document Revised: 05/10/2013 Document Reviewed: 04/29/2011 [facility] Patient Information 2014 [place], [place].

## 2013-09-28 MED ORDER — ATORVASTATIN CALCIUM 40 MG PO TABS: 40.0000 mg | ORAL_TABLET | Freq: Every day | ORAL | Status: DC

## 2013-09-28 MED ORDER — GLIMEPIRIDE 4 MG PO TABS: 8.0000 mg | ORAL_TABLET | Freq: Every day | ORAL | Status: DC

## 2013-09-28 NOTE — Addendum Note (Signed)
Addended by: Roselee Culver on: 09/28/2013 10:40 AM   Modules accepted: Orders

## 2013-09-29 ENCOUNTER — Telehealth: Payer: Self-pay | Admitting: Radiology

## 2013-09-29 DIAGNOSIS — H5710 Ocular pain, unspecified eye: Secondary | ICD-10-CM

## 2013-09-29 NOTE — Telephone Encounter (Signed)
Message copied by Candice Camp on Fri Sep 29, 2013  3:08 PM ------      Message from: Champ, Georgia S      Created: Thu Sep 28, 2013 10:38 AM       Please send he to ophthalmology ------

## 2013-10-02 ENCOUNTER — Telehealth: Payer: Self-pay

## 2013-10-02 NOTE — Telephone Encounter (Signed)
Advised pt print out is at front desk for pick up.

## 2013-10-02 NOTE — Telephone Encounter (Signed)
Patient calling to get a copy of her ct scan results done on 09/26/13 to take to her eye doctor; Dr. Ricki Miller for her follow up for her eye pain.   Best: [phone]

## 2013-10-03 NOTE — Telephone Encounter (Signed)
Left message for her, to advise referral to opthalmology, on 09/29/12 since she has continued eye pain

## 2013-12-04 ENCOUNTER — Ambulatory Visit (INDEPENDENT_AMBULATORY_CARE_PROVIDER_SITE_OTHER): Payer: BC Managed Care – PPO | Admitting: Family Medicine

## 2013-12-04 VITALS — BP 132/60 | HR 102 | Temp 98.0°F | Resp 16 | Ht 66.0 in | Wt 202.0 lb

## 2013-12-04 DIAGNOSIS — R51 Headache: Secondary | ICD-10-CM

## 2013-12-04 DIAGNOSIS — E1165 Type 2 diabetes mellitus with hyperglycemia: Secondary | ICD-10-CM

## 2013-12-04 DIAGNOSIS — M25551 Pain in right hip: Secondary | ICD-10-CM

## 2013-12-04 DIAGNOSIS — M549 Dorsalgia, unspecified: Secondary | ICD-10-CM

## 2013-12-04 DIAGNOSIS — M25529 Pain in unspecified elbow: Secondary | ICD-10-CM

## 2013-12-04 DIAGNOSIS — IMO0002 Reserved for concepts with insufficient information to code with codable children: Secondary | ICD-10-CM

## 2013-12-04 DIAGNOSIS — IMO0001 Reserved for inherently not codable concepts without codable children: Secondary | ICD-10-CM

## 2013-12-04 DIAGNOSIS — R519 Headache, unspecified: Secondary | ICD-10-CM

## 2013-12-04 DIAGNOSIS — L02419 Cutaneous abscess of limb, unspecified: Secondary | ICD-10-CM

## 2013-12-04 LAB — POCT CBC
Granulocyte percent: 68.1 %G (ref 37–80)
HCT, POC: 40.4 % (ref 37.7–47.9)
Hemoglobin: 12.9 g/dL (ref 12.2–16.2)
LYMPH, POC: 3.4 (ref 0.6–3.4)
MCH, POC: 23.3 pg — AB (ref 27–31.2)
MCHC: 31.9 g/dL (ref 31.8–35.4)
MCV: 72.9 fL — AB (ref 80–97)
MID (cbc): 0.8 (ref 0–0.9)
MPV: 8.8 fL (ref 0–99.8)
POC GRANULOCYTE: 8.9 — AB (ref 2–6.9)
POC LYMPH %: 25.9 % (ref 10–50)
POC MID %: 6 % (ref 0–12)
Platelet Count, POC: 388 10*3/uL (ref 142–424)
RBC: 5.54 M/uL — AB (ref 4.04–5.48)
RDW, POC: 18.8 %
WBC: 13 10*3/uL — AB (ref 4.6–10.2)

## 2013-12-04 LAB — GLUCOSE, POCT (MANUAL RESULT ENTRY): POC GLUCOSE: 144 mg/dL — AB (ref 70–99)

## 2013-12-04 MED ORDER — BUTALBITAL-APAP-CAFFEINE 50-325-40 MG PO TABS: 1.0000 | ORAL_TABLET | Freq: Two times a day (BID) | ORAL | Status: DC | PRN

## 2013-12-04 MED ORDER — DOXYCYCLINE HYCLATE 100 MG PO CAPS: 100.0000 mg | ORAL_CAPSULE | Freq: Two times a day (BID) | ORAL | Status: DC

## 2013-12-04 MED ORDER — ACETAMINOPHEN-CODEINE #3 300-30 MG PO TABS: 1.0000 | ORAL_TABLET | ORAL | Status: DC | PRN

## 2013-12-04 MED ORDER — TRAMADOL HCL 50 MG PO TABS: 50.0000 mg | ORAL_TABLET | Freq: Three times a day (TID) | ORAL | Status: DC | PRN

## 2013-12-04 NOTE — Progress Notes (Signed)
Subjective:    Patient ID: Bailey [name], female    DOB: 08-21-1956, 58 y.o.   MRN: [id number]  HPI Patient with bump on right elbow one week ago. Came to a head and started draining yesterday. Bump has gotten progressively better.Pain radiates toward hand, but mostly up into shoulder and right upper chest. Has had difficulty getting comfortable. Arm feels warm to touch. No fever. Pain 9/10. Had some tramadol for arthritis that she took with some relief. Took two tylenol with a little bit of relief.  Glucometer broken, unable to check blood sugar at home. Is interested in getting a new machine if covered by her insurance. Was started on amaryl 8 mg po 1/15.   Patient requests medication refills for her headache/arthritis medication. Does not use daily.   Review of Systems Had some chills, no sweats No chest pain No SOB    Objective:   Physical Exam  Nursing note and vitals reviewed. Constitutional: She is oriented to person, place, and time. She appears well-developed and well-nourished. No distress.  HENT:  Head: Normocephalic and atraumatic.  Eyes: Conjunctivae are normal.  Neck: Normal range of motion.  Cardiovascular: Normal rate and regular rhythm.   Pulmonary/Chest: Effort normal and breath sounds normal.  Musculoskeletal: Normal range of motion. She exhibits edema and tenderness.       Arms: Neurological: She is alert and oriented to person, place, and time.  Skin: Skin is warm and dry. She is not diaphoretic.  Right elbow with moderate swelling and erythema. Approximate 2 cm abcess with pustule distal to elbow. Tender to palpation.  Psychiatric: She has a normal mood and affect. Her behavior is normal. Judgment and thought content normal.   Results for orders placed in visit on 12/04/13  POCT CBC      Result Value Ref Range   WBC 13.0 (*) 4.6 - 10.2 K/uL   Lymph, poc 3.4  0.6 - 3.4   POC LYMPH PERCENT 25.9  10 - 50 %L   MID (cbc) 0.8  0 - 0.9   POC MID % 6.0  0  - 12 %M   POC Granulocyte 8.9 (*) 2 - 6.9   Granulocyte percent 68.1  37 - 80 %G   RBC 5.54 (*) 4.04 - 5.48 M/uL   Hemoglobin 12.9  12.2 - 16.2 g/dL   HCT, POC 40.4  37.7 - 47.9 %   MCV 72.9 (*) 80 - 97 fL   MCH, POC 23.3 (*) 27 - 31.2 pg   MCHC 31.9  31.8 - 35.4 g/dL   RDW, POC 18.8     Platelet Count, POC 388  142 - 424 K/uL   MPV 8.8  0 - 99.8 fL  GLUCOSE, POCT (MANUAL RESULT ENTRY)      Result Value Ref Range   POC Glucose 144 (*) 70 - 99 mg/dl    Procedure: Verbal consent obtained. Liocaine 2% with epi for local anesthesia. Area cleaned with betadine. No. 11 blade used to make incision through pores. Minimal purulence expressed. Wound cavity explored. No loculations found. 1/4 inch packing placed in wound. Dressing applied.     Assessment & Plan:  1. DM (diabetes mellitus), type 2, uncontrolled  - POCT glucose (manual entry)  2. Abscess, elbow  - POCT CBC- elevated white count, follow up tomorrow for repeat - Wound culture - doxycycline (VIBRAMYCIN) 100 MG capsule; Take 1 capsule (100 mg total) by mouth 2 (two) times daily.  Dispense: 20 capsule; Refill: 0 -  Wound care instructions given  3. Elbow pain  - acetaminophen-codeine (TYLENOL #3) 300-30 MG per tablet; Take 1-2 tablets by mouth every 4 (four) hours as needed for moderate pain.  Dispense: 30 tablet; Refill: 0  4. Chronic headaches  - butalbital-acetaminophen-caffeine (FIORICET, ESGIC) 50-325-40 MG per tablet; Take 1 tablet by mouth 2 (two) times daily as needed for headache.  Dispense: 40 tablet; Refill: 0  5. Hip pain, right  - traMADol (ULTRAM) 50 MG tablet; Take 1 tablet (50 mg total) by mouth every 8 (eight) hours as needed.  Dispense: 30 tablet; Refill: 0  6. Back pain  - traMADol (ULTRAM) 50 MG tablet; Take 1 tablet (50 mg total) by mouth every 8 (eight) hours as needed.  Dispense: 30 tablet; Refill: 0  Patient is going to make an appointment at the appointment center for a complete physical exam  and continued monitoring of chronic problems.  Elby Beck, FNP-BC  [facility] [place] [facility]  12/04/2013 9:59 AM

## 2013-12-04 NOTE — Progress Notes (Signed)
I have discussed this case with Ms. Gessner, NP and agree.  

## 2013-12-04 NOTE — Patient Instructions (Signed)
Warm compresses to area 3-4 times a day. Do place wet compress directly on bandage. Change dressing if it becomes wet or saturated with blood. Do not remove string packing material. Please return tomorrow between 9-4 to have your wound rechecked. Take your antibiotic (doxycycline) twice a day until finished.

## 2013-12-05 ENCOUNTER — Ambulatory Visit (INDEPENDENT_AMBULATORY_CARE_PROVIDER_SITE_OTHER): Payer: BC Managed Care – PPO | Admitting: Family Medicine

## 2013-12-05 VITALS — BP 134/76 | HR 86 | Temp 98.9°F | Resp 16

## 2013-12-05 DIAGNOSIS — E1165 Type 2 diabetes mellitus with hyperglycemia: Secondary | ICD-10-CM

## 2013-12-05 DIAGNOSIS — M25529 Pain in unspecified elbow: Secondary | ICD-10-CM

## 2013-12-05 DIAGNOSIS — IMO0002 Reserved for concepts with insufficient information to code with codable children: Secondary | ICD-10-CM

## 2013-12-05 DIAGNOSIS — IMO0001 Reserved for inherently not codable concepts without codable children: Secondary | ICD-10-CM

## 2013-12-05 DIAGNOSIS — L02419 Cutaneous abscess of limb, unspecified: Secondary | ICD-10-CM

## 2013-12-05 DIAGNOSIS — D72829 Elevated white blood cell count, unspecified: Secondary | ICD-10-CM

## 2013-12-05 LAB — POCT CBC
GRANULOCYTE PERCENT: 64 % (ref 37–80)
HEMATOCRIT: 39 % (ref 37.7–47.9)
Hemoglobin: 12.2 g/dL (ref 12.2–16.2)
Lymph, poc: 3.8 — AB (ref 0.6–3.4)
MCH, POC: 23 pg — AB (ref 27–31.2)
MCHC: 31.3 g/dL — AB (ref 31.8–35.4)
MCV: 73.5 fL — AB (ref 80–97)
MID (cbc): 0.9 (ref 0–0.9)
MPV: 9.3 fL (ref 0–99.8)
POC Granulocyte: 8.3 — AB (ref 2–6.9)
POC LYMPH PERCENT: 29.1 %L (ref 10–50)
POC MID %: 6.9 %M (ref 0–12)
Platelet Count, POC: 371 10*3/uL (ref 142–424)
RBC: 5.3 M/uL (ref 4.04–5.48)
RDW, POC: 17.7 %
WBC: 13 10*3/uL — AB (ref 4.6–10.2)

## 2013-12-05 NOTE — Progress Notes (Signed)
   Subjective:    Patient ID: Bailey [name], female    DOB: 1956/04/27, 58 y.o.   MRN: [id number]  HPI Patient reports today for 1 day follow up of I&D of right elbow. Patient reports tolerating pain ok. Had to work last night and was unable to take Tylenol #3. No fever or chills. Tolerating antibiotic without problems. Patient changed dressing last night. Said it was bloody without purulence.  Review of Systems     Objective:   Physical Exam Patient in no acute distress. Right elbow with good ROM. Area of erythema decreased. Right elbow dressing removed. Purulence and small amount bloody drainage noted. Packing removed and small amount purulence expressed. Wound repacked and redressed.   Results for orders placed in visit on 12/05/13  POCT CBC      Result Value Ref Range   WBC 13.0 (*) 4.6 - 10.2 K/uL   Lymph, poc 3.8 (*) 0.6 - 3.4   POC LYMPH PERCENT 29.1  10 - 50 %L   MID (cbc) 0.9  0 - 0.9   POC MID % 6.9  0 - 12 %M   POC Granulocyte 8.3 (*) 2 - 6.9   Granulocyte percent 64.0  37 - 80 %G   RBC 5.30  4.04 - 5.48 M/uL   Hemoglobin 12.2  12.2 - 16.2 g/dL   HCT, POC 39.0  37.7 - 47.9 %   MCV 73.5 (*) 80 - 97 fL   MCH, POC 23.0 (*) 27 - 31.2 pg   MCHC 31.3 (*) 31.8 - 35.4 g/dL   RDW, POC 17.7     Platelet Count, POC 371  142 - 424 K/uL   MPV 9.3  0 - 99.8 fL       Assessment & Plan:  1. Abscess, elbow - POCT CBC  Patient Instructions  Please continue warm compresses 3-4 times a day Continue antibiotic as ordered Return to clinic in 2 days for follow up of wound  2. Leukocytosis, unspecified - POCT CBC  3. DM (diabetes mellitus), type 2, uncontrolled -continue current medications  4. Elbow pain Tylenol #3 prn   Elby Beck, FNP-BC  [facility] [place] [facility]  12/05/2013 1:12 PM

## 2013-12-05 NOTE — Patient Instructions (Signed)
Please continue warm compresses 3-4 times a day Continue antibiotic as ordered Return to clinic in 2 days for follow up of wound

## 2013-12-05 NOTE — Progress Notes (Signed)
I have discussed this case with Ms. Gessner, NP and agree.  

## 2013-12-06 LAB — WOUND CULTURE
GRAM STAIN: NONE SEEN
GRAM STAIN: NONE SEEN
Gram Stain: NONE SEEN

## 2013-12-07 ENCOUNTER — Ambulatory Visit (INDEPENDENT_AMBULATORY_CARE_PROVIDER_SITE_OTHER): Payer: BC Managed Care – PPO | Admitting: Family Medicine

## 2013-12-07 VITALS — BP 134/70 | HR 90 | Temp 98.0°F | Resp 18

## 2013-12-07 DIAGNOSIS — L02419 Cutaneous abscess of limb, unspecified: Secondary | ICD-10-CM

## 2013-12-07 DIAGNOSIS — Z22322 Carrier or suspected carrier of Methicillin resistant Staphylococcus aureus: Secondary | ICD-10-CM

## 2013-12-07 DIAGNOSIS — M25529 Pain in unspecified elbow: Secondary | ICD-10-CM

## 2013-12-07 DIAGNOSIS — IMO0002 Reserved for concepts with insufficient information to code with codable children: Secondary | ICD-10-CM

## 2013-12-07 NOTE — Progress Notes (Signed)
   Subjective:    Patient ID: Bailey [name], female    DOB: 1956-06-17, 58 y.o.   MRN: [id number]  HPI Patient presents today for follow up of right elbow abscess s/p I&D. She is tolerating the antibiotic without side effects.  She has had decreased pain over last 24 hours with improved movement.  Wound culture positive for MRSA. Discussed results with patient.  Review of Systems     Objective:   Physical Exam  Constitutional: She is oriented to person, place, and time. She appears well-developed and well-nourished.  Neurological: She is alert and oriented to person, place, and time.  Skin: Skin is warm and dry.  Psychiatric: She has a normal mood and affect. Her behavior is normal. Judgment and thought content normal.   Right elbow: Local erythema significantly decreased. Dressing and packing removed. Moderate amount of purulent drainage present. Local anesthetic administered. 2% plain lidocaine was used. Wound explored and debrided.  Packed with 1/4 inch packing, dressing applied.     Assessment & Plan:  1. Abscess, elbow Patient is to continue daily dressing changes. She will return in 48 hours for recheck.   2. MRSA (methicillin resistant staph aureus) culture positive Discussed test results with patient. Doxycycline sensitive. Finish antibiotic as ordered.   Elby Beck, FNP-BC  [facility] [place] [facility]  12/07/2013 1:07 PM  Discussed with Ms. Carlean Purl, NP agree with above.  Christell Faith, MHS, PA-C [facility] [place], [place] [address] [place] [facility] 12/07/2013 2:20 PM

## 2013-12-09 ENCOUNTER — Ambulatory Visit (INDEPENDENT_AMBULATORY_CARE_PROVIDER_SITE_OTHER): Payer: BC Managed Care – PPO | Admitting: Physician Assistant

## 2013-12-09 VITALS — BP 130/70 | HR 135 | Temp 101.0°F | Resp 18 | Ht 65.5 in | Wt 204.0 lb

## 2013-12-09 DIAGNOSIS — R05 Cough: Secondary | ICD-10-CM

## 2013-12-09 DIAGNOSIS — R509 Fever, unspecified: Secondary | ICD-10-CM

## 2013-12-09 DIAGNOSIS — L02419 Cutaneous abscess of limb, unspecified: Secondary | ICD-10-CM

## 2013-12-09 DIAGNOSIS — R059 Cough, unspecified: Secondary | ICD-10-CM

## 2013-12-09 LAB — POCT INFLUENZA A/B
INFLUENZA B, POC: NEGATIVE
Influenza A, POC: NEGATIVE

## 2013-12-09 MED ORDER — BENZONATATE 100 MG PO CAPS: 100.0000 mg | ORAL_CAPSULE | Freq: Three times a day (TID) | ORAL | Status: DC | PRN

## 2013-12-09 NOTE — Patient Instructions (Signed)
Continue taking the doxycycline as directed - finish the full course  Change the bandage every day  Come back in if you have concerns  The pain medicine will also help your cough.  You can use the [employer] every 8 hours as needed  Plenty of fluids (water is best!) and rest  Please let us know if any symptoms are worsening or not improving

## 2013-12-09 NOTE — Progress Notes (Signed)
Subjective:    Patient ID: Bailey [name], female    DOB: 1955/10/16, 58 y.o.   MRN: [id number]  HPI   Bailey [name] is a pleasant 58 yr old female here for wound care following I&D of an abscess at her right elbow on 12/04/13.  She states the wound is doing well.  She is changing the dressing regularly.  She is taking the doxy and tolerating it well.  She still has pain at the elbow but this is improving.   Yesterday she began feeling ill with onset of fever and cough.  Tmax 101F.  The cough is dry and non-productive.  She denies nasal congestion, sore throat, ear pain, GI symptoms, body aches.  She has not taken anything for symptoms yet.  No known sick contacts.  No flu shot this season.     Review of Systems  Constitutional: Positive for fever and chills.  HENT: Negative for congestion, rhinorrhea and sore throat.   Respiratory: Positive for cough. Negative for shortness of breath and wheezing.   Cardiovascular: Negative.   Gastrointestinal: Negative.   Musculoskeletal: Negative.   Skin: Negative.        Objective:   Physical Exam  Vitals reviewed. Constitutional: She is oriented to person, place, and time. She appears well-developed and well-nourished. No distress.  HENT:  Head: Normocephalic and atraumatic.  Right Ear: Tympanic membrane and ear canal normal.  Left Ear: Tympanic membrane and ear canal normal.  Mouth/Throat: Uvula is midline, oropharynx is clear and moist and mucous membranes are normal.  Eyes: Conjunctivae are normal. No scleral icterus.  Neck: Neck supple.  Cardiovascular: Normal heart sounds.  Tachycardia present.   ~110bpm  Pulmonary/Chest: Effort normal and breath sounds normal. She has no wheezes. She has no rales.  Abdominal: Soft. There is no tenderness.  Lymphadenopathy:    She has no cervical adenopathy.  Neurological: She is alert and oriented to person, place, and time.  Skin: Skin is warm and dry.     Healing abscess right elbow; no  surrounding erythema, induration; unable to express any further purulence; I have not repacked today  Psychiatric: She has a normal mood and affect. Her behavior is normal.    Results for orders placed in visit on 12/09/13  POCT INFLUENZA A/B      Result Value Ref Range   Influenza A, POC Negative     Influenza B, POC Negative           Assessment & Plan:  Fever, unspecified - Plan: POCT Influenza A/B  Cough - Plan: POCT Influenza A/B, benzonatate (TESSALON) 100 MG capsule  Abscess, elbow   Ms. Gaydos is a pleasant 58 yr old female here for follow up on right elbow abscess.  The area appears to be healing well.  Pain is improving.  I have not repacked today.  Finish doxy.  Continue daily dressing changes until completely healed.  Follow up if concerns.    Pt developed fever and dry cough yesterday.  Flu test is negative.  Her lungs are CTA on exam.  In any case, she is currently being treated with doxy which should provide good coverage for upper respiratory pathogens as well as the MRSA abscess.  She has Tylenol with codeine for pain, which also helps her cough.  Encouraged ibuprofen for fever relief.  Push fluids, rest.  We discussed RTC precautions including worsening fever, worsening cough, SOB, etc.  Pt understands and agrees  Pt to call or RTC if  worsening or not improving  E. Natividad Brood MHS, PA-C [facility] [place] [facility] 3/22/20159:05 AM

## 2014-01-15 ENCOUNTER — Encounter: Payer: Self-pay | Admitting: Family Medicine

## 2014-01-15 ENCOUNTER — Ambulatory Visit (INDEPENDENT_AMBULATORY_CARE_PROVIDER_SITE_OTHER): Payer: BC Managed Care – PPO | Admitting: Family Medicine

## 2014-01-15 ENCOUNTER — Other Ambulatory Visit: Payer: Self-pay | Admitting: Family Medicine

## 2014-01-15 VITALS — BP 130/80 | HR 97 | Temp 97.7°F | Resp 16 | Ht 66.0 in | Wt 200.2 lb

## 2014-01-15 DIAGNOSIS — E7800 Pure hypercholesterolemia, unspecified: Secondary | ICD-10-CM

## 2014-01-15 DIAGNOSIS — N8110 Cystocele, unspecified: Secondary | ICD-10-CM

## 2014-01-15 DIAGNOSIS — Z111 Encounter for screening for respiratory tuberculosis: Secondary | ICD-10-CM

## 2014-01-15 DIAGNOSIS — IMO0002 Reserved for concepts with insufficient information to code with codable children: Secondary | ICD-10-CM

## 2014-01-15 DIAGNOSIS — Z124 Encounter for screening for malignant neoplasm of cervix: Secondary | ICD-10-CM

## 2014-01-15 DIAGNOSIS — E118 Type 2 diabetes mellitus with unspecified complications: Secondary | ICD-10-CM

## 2014-01-15 DIAGNOSIS — A5901 Trichomonal vulvovaginitis: Secondary | ICD-10-CM

## 2014-01-15 DIAGNOSIS — Z23 Encounter for immunization: Secondary | ICD-10-CM

## 2014-01-15 DIAGNOSIS — E1165 Type 2 diabetes mellitus with hyperglycemia: Secondary | ICD-10-CM

## 2014-01-15 DIAGNOSIS — N819 Female genital prolapse, unspecified: Secondary | ICD-10-CM

## 2014-01-15 DIAGNOSIS — Z0000 Encounter for general adult medical examination without abnormal findings: Secondary | ICD-10-CM

## 2014-01-15 DIAGNOSIS — I10 Essential (primary) hypertension: Secondary | ICD-10-CM

## 2014-01-15 LAB — CBC WITH DIFFERENTIAL/PLATELET
BASOS ABS: 0 10*3/uL (ref 0.0–0.1)
BASOS PCT: 0 % (ref 0–1)
Eosinophils Absolute: 0.2 10*3/uL (ref 0.0–0.7)
Eosinophils Relative: 2 % (ref 0–5)
HEMATOCRIT: 38.7 % (ref 36.0–46.0)
HEMOGLOBIN: 13.1 g/dL (ref 12.0–15.0)
LYMPHS PCT: 41 % (ref 12–46)
Lymphs Abs: 4.3 10*3/uL — ABNORMAL HIGH (ref 0.7–4.0)
MCH: 23.2 pg — ABNORMAL LOW (ref 26.0–34.0)
MCHC: 33.9 g/dL (ref 30.0–36.0)
MCV: 68.5 fL — ABNORMAL LOW (ref 78.0–100.0)
MONOS PCT: 5 % (ref 3–12)
Monocytes Absolute: 0.5 10*3/uL (ref 0.1–1.0)
NEUTROS ABS: 5.4 10*3/uL (ref 1.7–7.7)
Neutrophils Relative %: 52 % (ref 43–77)
Platelets: 323 10*3/uL (ref 150–400)
RBC: 5.65 MIL/uL — ABNORMAL HIGH (ref 3.87–5.11)
RDW: 18.7 % — AB (ref 11.5–15.5)
WBC: 10.4 10*3/uL (ref 4.0–10.5)

## 2014-01-15 LAB — COMPREHENSIVE METABOLIC PANEL
ALK PHOS: 126 U/L — AB (ref 39–117)
ALT: 27 U/L (ref 0–35)
AST: 20 U/L (ref 0–37)
Albumin: 4.6 g/dL (ref 3.5–5.2)
BILIRUBIN TOTAL: 0.5 mg/dL (ref 0.2–1.2)
BUN: 10 mg/dL (ref 6–23)
CO2: 22 mEq/L (ref 19–32)
Calcium: 9.9 mg/dL (ref 8.4–10.5)
Chloride: 104 mEq/L (ref 96–112)
Creat: 0.49 mg/dL — ABNORMAL LOW (ref 0.50–1.10)
Glucose, Bld: 95 mg/dL (ref 70–99)
Potassium: 3.9 mEq/L (ref 3.5–5.3)
SODIUM: 138 meq/L (ref 135–145)
Total Protein: 7.7 g/dL (ref 6.0–8.3)

## 2014-01-15 LAB — LIPID PANEL
Cholesterol: 156 mg/dL (ref 0–200)
HDL: 49 mg/dL (ref >39)
LDL CALC: 89 mg/dL (ref 0–99)
Total CHOL/HDL Ratio: 3.2 Ratio
Triglycerides: 89 mg/dL (ref <150)
VLDL: 18 mg/dL (ref 0–40)

## 2014-01-15 LAB — POCT GLYCOSYLATED HEMOGLOBIN (HGB A1C): HEMOGLOBIN A1C: 6.3

## 2014-01-15 MED ORDER — GLIMEPIRIDE 4 MG PO TABS: 8.0000 mg | ORAL_TABLET | Freq: Every day | ORAL | Status: DC

## 2014-01-15 MED ORDER — LISINOPRIL 20 MG PO TABS: 20.0000 mg | ORAL_TABLET | Freq: Every day | ORAL | Status: DC

## 2014-01-15 NOTE — Progress Notes (Signed)
PPD Placement note  Bailey [name], 58 y.o. female is here today for placement of PPD test  Reason for PPD test: employment   1. Have you ever had a TB Skin Test?: yes  2. Have you had a TB Skin Test in the past 6 weeks? no  If so, Date of Test n/a  3. Have you ever had a positive reaction? no  If yes, were you treated with INH? no   4. Have you ever had a BCG vaccine? no  5. Have you ever had Tuberculosis? no  6. Have you been in recent contact with anyone known or suspected of having active TB disease?: no      Date of exposure (if applicable): n/a       Name of person they were exposed to (if applicable): n/a  Patient's Country of origin?: n/a  7. Have you ever been advised by a healthcare provider not to have a TB skin test? no  8. Are yout taking any oral or IV steroid medication now or have you taken it in the last month? no  Verified in allergy area and with patient that the patient is not allergic to the products PPD is made of (Phenol or Tween). Yes   PPD placed on 01/15/2014 at 12:09 PM. Patient advised to return for reading within 48-72 hours.

## 2014-01-15 NOTE — Progress Notes (Addendum)
[facility] [address] [place] [address] [phone]  Date:  01/15/2014   Name:  Bailey [name]   DOB:  01-29-1956   MRN:  [id number]  PCP:  Lamar Blinks, MD    Chief Complaint: Annual Exam   History of Present Illness:  Bailey [name] is a 58 y.o. very pleasant female patient who presents with the following:  Here today for an annual exam.  History of HTN, hyperlipidemia and DM.  Most recent A1c in January was 8.2%.  She is fasting today so far.  She started cholesterol medication in January.   She is also now on metformin; however she notes that she has persistent diarrhea with this medication and wonders if she needs to continue taking it.    Her last mammo was about 5 years ago, pap maybe 5 years as well.   She has not yet had a colonoscopy.  He has not yet had a pneumonia shot.  Unsure date of last tetanus shot She needs a PPD today for her job- she is a Product manager for adolescents.    She also is aware of a bladder prolapse.  Her husband passed away several years ago and she is thinking of dating again; wonders if anything can be done to improve this situation.  She does not have any pain, but fears that "it looks strange."   Patient Active Problem List   Diagnosis Date Noted  . Type II or unspecified type diabetes mellitus with unspecified complication, uncontrolled 02/16/2013    Past Medical History  Diagnosis Date  . Diabetes mellitus without complication   . Hypertension   . Arthritis     Past Surgical History  Procedure Laterality Date  . Cholecystectomy    . Uterine fibroid surgery      History  Substance Use Topics  . Smoking status: Current Every Day Smoker -- 1.00 packs/day for 20 years    Types: Cigarettes  . Smokeless tobacco: Not on file  . Alcohol Use: No    Family History  Problem Relation Age of Onset  . Diabetes Mother   . Heart disease Father   . Cancer Brother     pancreatic    No  Known Allergies  Medication list has been reviewed and updated.  Current Outpatient Prescriptions on File Prior to Visit  Medication Sig Dispense Refill  . acetaminophen-codeine (TYLENOL #3) 300-30 MG per tablet Take 1-2 tablets by mouth every 4 (four) hours as needed for moderate pain.  30 tablet  0  . atorvastatin (LIPITOR) 40 MG tablet Take 1 tablet (40 mg total) by mouth daily.  90 tablet  3  . butalbital-acetaminophen-caffeine (FIORICET, ESGIC) 50-325-40 MG per tablet Take 1 tablet by mouth 2 (two) times daily as needed for headache.  40 tablet  0  . doxycycline (VIBRAMYCIN) 100 MG capsule Take 1 capsule (100 mg total) by mouth 2 (two) times daily.  20 capsule  0  . glimepiride (AMARYL) 4 MG tablet Take 2 tablets (8 mg total) by mouth daily before breakfast.  60 tablet  3  . lisinopril (PRINIVIL,ZESTRIL) 20 MG tablet Take 1 tablet (20 mg total) by mouth daily.  90 tablet  1  . metFORMIN (GLUCOPHAGE) 500 MG tablet Take 2 tablets (1,000 mg total) by mouth 2 (two) times daily with a meal.  120 tablet  5  . traMADol (ULTRAM) 50 MG tablet Take 1 tablet (50 mg total) by mouth every 8 (eight) hours  as needed.  30 tablet  0   No current facility-administered medications on file prior to visit.    Review of Systems:  As per HPI- otherwise negative.   Physical Examination: Filed Vitals:   01/15/14 1108  BP: 146/79  Pulse: 97  Temp: 97.7 F (36.5 C)  Resp: 16   Filed Vitals:   01/15/14 1108  Height: 5' 6"  (1.676 m)  Weight: 200 lb 3.2 oz (90.81 kg)   Body mass index is 32.33 kg/(m^2). Ideal Body Weight: Weight in (lb) to have BMI = 25: 154.6  GEN: WDWN, NAD, Non-toxic, A & O x 3, overweight, looks well HEENT: Atraumatic, Normocephalic. Neck supple. No masses, No LAD.  Oropharynx normal.  PEERL,EOMI.   Right TM obscured by wax, left TM normal Ears and Nose: No external deformity. CV: RRR, No M/G/R. No JVD. No thrill. No extra heart sounds. PULM: CTA B, no wheezes, crackles,  rhonchi. No retractions. No resp. distress. No accessory muscle use. ABD: S, NT, ND, +BS. No rebound. No HSM. EXTR: No c/c/e NEURO Normal gait.  PSYCH: Normally interactive. Conversant. Not depressed or anxious appearing.  Calm demeanor.  Breast: normal exam, no masses/ dimpling/ discharge Pelvic: no vaginal lesions or discharge. Uterus normal, no CMT, no adnexal tendereness or masses.  She does have a visible bladder prolapse that is easily reducible. No evidence of dryness or abrasion of the bladder tissue.    Results for orders placed in visit on 01/15/14  POCT GLYCOSYLATED HEMOGLOBIN (HGB A1C)      Result Value Ref Range   Hemoglobin A1C 6.3      Assessment and Plan: Physical exam - Plan: CBC with Differential, TSH  Type II or unspecified type diabetes mellitus with unspecified complication, uncontrolled - Plan: POCT glycosylated hemoglobin (Hb A1C), Comprehensive metabolic panel, Microalbumin, urine, glimepiride (AMARYL) 4 MG tablet  HTN (hypertension) - Plan: Comprehensive metabolic panel, lisinopril (PRINIVIL,ZESTRIL) 20 MG tablet  High cholesterol - Plan: Lipid panel  Immunization due - Plan: Tdap vaccine greater than or equal to 7yo IM, Pneumococcal polysaccharide vaccine 23-valent greater than or equal to 2yo subcutaneous/IM  Screening for tuberculosis - Plan: TB Skin Test  Screening for cervical cancer - Plan: Pap IG and HPV (high risk) DNA detection  Bladder prolapse, female, acquired  Much improved A1c.  She is pleased with this progress.  She has been working on her diet.  Explained that the metfomin is likely helping here.  However she may be able to reduce her dose and still have benefit with less SE.  She will try taking just 500 mg BID and see if this does any better for her Will plan further follow- up pending labs. Updated tdap and pneumovax Placed PPD for her job Encouraged mammogram and colonoscopy  Discussed options for her bladder prolapse- pessary vs  surgical.  She has seen Dr. Garwin Brothers in the past and will follow-up with her.   See patient instructions for more details.     Signed Lamar Blinks, MD   Called to discuss with her on 4/30. She does have trichomonas; she has been SA again but not in several months.  Send in rx for flagyl, and advised her that her partner needs to be treated as well.  Results for orders placed in visit on 01/15/14  CBC WITH DIFFERENTIAL      Result Value Ref Range   WBC 10.4  4.0 - 10.5 K/uL   RBC 5.65 (*) 3.87 - 5.11 MIL/uL   Hemoglobin  13.1  12.0 - 15.0 g/dL   HCT 38.7  36.0 - 46.0 %   MCV 68.5 (*) 78.0 - 100.0 fL   MCH 23.2 (*) 26.0 - 34.0 pg   MCHC 33.9  30.0 - 36.0 g/dL   RDW 18.7 (*) 11.5 - 15.5 %   Platelets 323  150 - 400 K/uL   Neutrophils Relative % 52  43 - 77 %   Neutro Abs 5.4  1.7 - 7.7 K/uL   Lymphocytes Relative 41  12 - 46 %   Lymphs Abs 4.3 (*) 0.7 - 4.0 K/uL   Monocytes Relative 5  3 - 12 %   Monocytes Absolute 0.5  0.1 - 1.0 K/uL   Eosinophils Relative 2  0 - 5 %   Eosinophils Absolute 0.2  0.0 - 0.7 K/uL   Basophils Relative 0  0 - 1 %   Basophils Absolute 0.0  0.0 - 0.1 K/uL   Smear Review Criteria for review not met    COMPREHENSIVE METABOLIC PANEL      Result Value Ref Range   Sodium 138  135 - 145 mEq/L   Potassium 3.9  3.5 - 5.3 mEq/L   Chloride 104  96 - 112 mEq/L   CO2 22  19 - 32 mEq/L   Glucose, Bld 95  70 - 99 mg/dL   BUN 10  6 - 23 mg/dL   Creat 0.49 (*) 0.50 - 1.10 mg/dL   Total Bilirubin 0.5  0.2 - 1.2 mg/dL   Alkaline Phosphatase 126 (*) 39 - 117 U/L   AST 20  0 - 37 U/L   ALT 27  0 - 35 U/L   Total Protein 7.7  6.0 - 8.3 g/dL   Albumin 4.6  3.5 - 5.2 g/dL   Calcium 9.9  8.4 - 10.5 mg/dL  LIPID PANEL      Result Value Ref Range   Cholesterol 156  0 - 200 mg/dL   Triglycerides 89  <150 mg/dL   HDL 49  >39 mg/dL   Total CHOL/HDL Ratio 3.2     VLDL 18  0 - 40 mg/dL   LDL Cholesterol 89  0 - 99 mg/dL  TSH      Result Value Ref Range   TSH 1.000   0.350 - 4.500 uIU/mL  MICROALBUMIN, URINE      Result Value Ref Range   Microalb, Ur 1.77  0.00 - 1.89 mg/dL  POCT GLYCOSYLATED HEMOGLOBIN (HGB A1C)      Result Value Ref Range   Hemoglobin A1C 6.3    TB SKIN TEST      Result Value Ref Range   TB Skin Test Negative     Induration 100m x 4 mm    PAP IG AND HPV HIGH-RISK      Result Value Ref Range   HPV DNA High Risk Not Detected     Specimen adequacy: SEE NOTE     FINAL DIAGNOSIS: SEE NOTE     COMMENTS: SEE NOTE     Cytotechnologist: SEE NOTE     QC Cytotechnologist: SEE NOTE

## 2014-01-15 NOTE — Patient Instructions (Addendum)
I will be in touch with the rest of your labs- your diabetes control and blood pressure look good today  Please schedule a mammogram asap:  [address] # [place], [place] [phone]  Also, it is time to schedule a visit with a GI doctor so you can get a colonoscopy.   Try [facility] [place], [place] [phone] [facility] [place], [place], [place] [address]  [phone]  Give Dr. Garwin Brothers a call about your bladder prolapse. There is likely something that can be done to help you.    Try decreasing to 500 mg of metformin (1 pill) twice a day.  Your A1c looks good today.  If decreasing to this dose of metformin does not help your GI symptoms please let me know.

## 2014-01-16 ENCOUNTER — Encounter: Payer: Self-pay | Admitting: Family Medicine

## 2014-01-16 LAB — MICROALBUMIN, URINE: Microalb, Ur: 1.77 mg/dL (ref 0.00–1.89)

## 2014-01-16 LAB — TSH: TSH: 1 u[IU]/mL (ref 0.350–4.500)

## 2014-01-17 ENCOUNTER — Ambulatory Visit (INDEPENDENT_AMBULATORY_CARE_PROVIDER_SITE_OTHER): Payer: BC Managed Care – PPO | Admitting: Family Medicine

## 2014-01-17 DIAGNOSIS — Z111 Encounter for screening for respiratory tuberculosis: Secondary | ICD-10-CM

## 2014-01-17 DIAGNOSIS — Z09 Encounter for follow-up examination after completed treatment for conditions other than malignant neoplasm: Secondary | ICD-10-CM

## 2014-01-17 LAB — PAP IG AND HPV HIGH-RISK: HPV DNA High Risk: NOT DETECTED

## 2014-01-17 LAB — FERRITIN: Ferritin: 136 ng/mL (ref 10–291)

## 2014-01-17 LAB — TB SKIN TEST: TB Skin Test: NEGATIVE

## 2014-01-18 ENCOUNTER — Encounter: Payer: Self-pay | Admitting: Family Medicine

## 2014-01-18 MED ORDER — METRONIDAZOLE 500 MG PO TABS: 500.0000 mg | ORAL_TABLET | Freq: Two times a day (BID) | ORAL | Status: DC

## 2014-01-18 NOTE — Addendum Note (Signed)
Addended by: Lamar Blinks C on: 01/18/2014 10:47 AM   Modules accepted: Orders

## 2014-02-28 ENCOUNTER — Ambulatory Visit (INDEPENDENT_AMBULATORY_CARE_PROVIDER_SITE_OTHER): Payer: BC Managed Care – PPO | Admitting: Family Medicine

## 2014-02-28 VITALS — BP 142/82 | HR 98 | Temp 98.0°F | Resp 17 | Ht 66.0 in | Wt 207.0 lb

## 2014-02-28 DIAGNOSIS — R51 Headache: Secondary | ICD-10-CM

## 2014-02-28 DIAGNOSIS — M542 Cervicalgia: Secondary | ICD-10-CM

## 2014-02-28 DIAGNOSIS — J329 Chronic sinusitis, unspecified: Secondary | ICD-10-CM

## 2014-02-28 MED ORDER — DOXYCYCLINE HYCLATE 100 MG PO TABS: 100.0000 mg | ORAL_TABLET | Freq: Two times a day (BID) | ORAL | Status: DC

## 2014-02-28 MED ORDER — CYCLOBENZAPRINE HCL 5 MG PO TABS: 5.0000 mg | ORAL_TABLET | Freq: Every day | ORAL | Status: DC

## 2014-02-28 NOTE — Patient Instructions (Signed)
Heat often helps with the neck pain.    Sinusitis Sinusitis is redness, soreness, and swelling (inflammation) of the paranasal sinuses. Paranasal sinuses are air pockets within the bones of your face (beneath the eyes, the middle of the forehead, or above the eyes). In healthy paranasal sinuses, mucus is able to drain out, and air is able to circulate through them by way of your nose. However, when your paranasal sinuses are inflamed, mucus and air can become trapped. This can allow bacteria and other germs to grow and cause infection. Sinusitis can develop quickly and last only a short time (acute) or continue over a long period (chronic). Sinusitis that lasts for more than 12 weeks is considered chronic.  CAUSES  Causes of sinusitis include:  Allergies.  Structural abnormalities, such as displacement of the cartilage that separates your nostrils (deviated septum), which can decrease the air flow through your nose and sinuses and affect sinus drainage.  Functional abnormalities, such as when the small hairs (cilia) that line your sinuses and help remove mucus do not work properly or are not present. SYMPTOMS  Symptoms of acute and chronic sinusitis are the same. The primary symptoms are pain and pressure around the affected sinuses. Other symptoms include:  Upper toothache.  Earache.  Headache.  Bad breath.  Decreased sense of smell and taste.  A cough, which worsens when you are lying flat.  Fatigue.  Fever.  Thick drainage from your nose, which often is green and may contain pus (purulent).  Swelling and warmth over the affected sinuses. DIAGNOSIS  Your caregiver will perform a physical exam. During the exam, your caregiver may:  Look in your nose for signs of abnormal growths in your nostrils (nasal polyps).  Tap over the affected sinus to check for signs of infection.  View the inside of your sinuses (endoscopy) with a special imaging device with a light attached  (endoscope), which is inserted into your sinuses. If your caregiver suspects that you have chronic sinusitis, one or more of the following tests may be recommended:  Allergy tests.  Nasal culture A sample of mucus is taken from your nose and sent to a lab and screened for bacteria.  Nasal cytology A sample of mucus is taken from your nose and examined by your caregiver to determine if your sinusitis is related to an allergy. TREATMENT  Most cases of acute sinusitis are related to a viral infection and will resolve on their own within 10 days. Sometimes medicines are prescribed to help relieve symptoms (pain medicine, decongestants, nasal steroid sprays, or saline sprays).  However, for sinusitis related to a bacterial infection, your caregiver will prescribe antibiotic medicines. These are medicines that will help kill the bacteria causing the infection.  Rarely, sinusitis is caused by a fungal infection. In theses cases, your caregiver will prescribe antifungal medicine. For some cases of chronic sinusitis, surgery is needed. Generally, these are cases in which sinusitis recurs more than 3 times per year, despite other treatments. HOME CARE INSTRUCTIONS   Drink plenty of water. Water helps thin the mucus so your sinuses can drain more easily.  Use a humidifier.  Inhale steam 3 to 4 times a day (for example, sit in the bathroom with the shower running).  Apply a warm, moist washcloth to your face 3 to 4 times a day, or as directed by your caregiver.  Use saline nasal sprays to help moisten and clean your sinuses.  Take over-the-counter or prescription medicines for pain, discomfort, or  fever only as directed by your caregiver. SEEK IMMEDIATE MEDICAL CARE IF:  You have increasing pain or severe headaches.  You have nausea, vomiting, or drowsiness.  You have swelling around your face.  You have vision problems.  You have a stiff neck.  You have difficulty breathing. MAKE SURE  YOU:   Understand these instructions.  Will watch your condition.  Will get help right away if you are not doing well or get worse. Document Released: 09/07/2005 Document Revised: 11/30/2011 Document Reviewed: 09/22/2011 [facility] Patient Information 2014 [place], [place].

## 2014-02-28 NOTE — Progress Notes (Signed)
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 She has 3 grandchildren 2 of which are in [place].  Patient is here for 2 problems: Tender nasal passages with mucopurulent discharge and chronic recurrent neck pain.  Patient has a history of MRSA. She has no fever or cough at the present time.  Patient has a history of recurrent neck pain. She has soreness when she turns her neck back and forth and looks up. She's having no radicular symptoms and no weakness in the arms.  Objective: Patient is an obese middle-aged woman in no acute distress HEENT: Mildly swollen frenulum of the nasal passage, mucopurulent discharge, normal TMs, normal oropharynx  Neck: Supple no adenopathy or thyromegaly. Patient winces when she rotates left and right but she does have full range of motion. Chest: Clear Heart: Regular no murmur Neurological: Patient has good strength in both arms and has full range of motion of shoulders, elbows, wrists  Skin: Clear  Assessment: Moderately stressed woman who is probably mildly chronically depressed. She denies any suicide ideation. She's looking forward to retirement.  Patient has sinusitis and chronic neck soreness. I reviewed the x-rays and there does not appear to be any acute or serious problems there.  Sinusitis - Plan: doxycycline (VIBRA-TABS) 100 MG tablet  Headache(784.0) - Plan: cyclobenzaprine (FLEXERIL) 5 MG tablet  Neck pain - Plan: cyclobenzaprine (FLEXERIL) 5 MG tablet  Signed, Robyn Haber, MD

## 2014-04-09 ENCOUNTER — Other Ambulatory Visit: Payer: Self-pay | Admitting: Family Medicine

## 2014-06-16 ENCOUNTER — Telehealth: Payer: Self-pay

## 2014-06-16 NOTE — Telephone Encounter (Signed)
Cll pt - advsd of need to schedule Diabetic Care follow up. Pt stated she was laying down, did not have appt book near her but would call on Monday, September 28 th to schedule appt with Dr. Lorelei Pont.

## 2014-09-05 ENCOUNTER — Other Ambulatory Visit: Payer: Self-pay | Admitting: Emergency Medicine

## 2014-09-07 ENCOUNTER — Ambulatory Visit (INDEPENDENT_AMBULATORY_CARE_PROVIDER_SITE_OTHER): Admitting: Family Medicine

## 2014-09-07 ENCOUNTER — Telehealth: Payer: Self-pay

## 2014-09-07 ENCOUNTER — Encounter: Payer: Self-pay | Admitting: Family Medicine

## 2014-09-07 VITALS — BP 130/70 | HR 90 | Temp 98.0°F | Resp 16 | Ht 66.5 in | Wt 205.4 lb

## 2014-09-07 DIAGNOSIS — E119 Type 2 diabetes mellitus without complications: Secondary | ICD-10-CM

## 2014-09-07 DIAGNOSIS — I10 Essential (primary) hypertension: Secondary | ICD-10-CM

## 2014-09-07 DIAGNOSIS — E785 Hyperlipidemia, unspecified: Secondary | ICD-10-CM

## 2014-09-07 DIAGNOSIS — Z23 Encounter for immunization: Secondary | ICD-10-CM

## 2014-09-07 DIAGNOSIS — E118 Type 2 diabetes mellitus with unspecified complications: Secondary | ICD-10-CM

## 2014-09-07 LAB — LIPID PANEL
CHOLESTEROL: 148 mg/dL (ref 0–200)
HDL: 42 mg/dL (ref >39)
LDL Cholesterol: 81 mg/dL (ref 0–99)
TRIGLYCERIDES: 127 mg/dL (ref <150)
Total CHOL/HDL Ratio: 3.5 Ratio
VLDL: 25 mg/dL (ref 0–40)

## 2014-09-07 LAB — COMPREHENSIVE METABOLIC PANEL
ALT: 28 U/L (ref 0–35)
AST: 19 U/L (ref 0–37)
Albumin: 4.2 g/dL (ref 3.5–5.2)
Alkaline Phosphatase: 106 U/L (ref 39–117)
BUN: 7 mg/dL (ref 6–23)
CO2: 22 meq/L (ref 19–32)
CREATININE: 0.45 mg/dL — AB (ref 0.50–1.10)
Calcium: 10 mg/dL (ref 8.4–10.5)
Chloride: 104 mEq/L (ref 96–112)
Glucose, Bld: 86 mg/dL (ref 70–99)
Potassium: 4.1 mEq/L (ref 3.5–5.3)
SODIUM: 138 meq/L (ref 135–145)
TOTAL PROTEIN: 7.5 g/dL (ref 6.0–8.3)
Total Bilirubin: 0.3 mg/dL (ref 0.2–1.2)

## 2014-09-07 LAB — POCT GLYCOSYLATED HEMOGLOBIN (HGB A1C): HEMOGLOBIN A1C: 6.6

## 2014-09-07 MED ORDER — GLUCOSE BLOOD VI STRP: ORAL_STRIP | Status: DC

## 2014-09-07 MED ORDER — METFORMIN HCL 500 MG PO TABS: 1000.0000 mg | ORAL_TABLET | Freq: Two times a day (BID) | ORAL | Status: DC

## 2014-09-07 MED ORDER — GLIMEPIRIDE 4 MG PO TABS: 8.0000 mg | ORAL_TABLET | Freq: Every day | ORAL | Status: DC

## 2014-09-07 MED ORDER — LISINOPRIL 20 MG PO TABS: 20.0000 mg | ORAL_TABLET | Freq: Every day | ORAL | Status: DC

## 2014-09-07 MED ORDER — ATORVASTATIN CALCIUM 40 MG PO TABS: 40.0000 mg | ORAL_TABLET | Freq: Every day | ORAL | Status: DC

## 2014-09-07 NOTE — Progress Notes (Signed)
[facility] [address], [place] [address] [phone]  Date:  09/07/2014   Name:  Bailey [name]   DOB:  1955/12/24   MRN:  [id number]  PCP:  Lamar Blinks, MD    Chief Complaint: Labs Only; Medication Refill; and Metformin   History of Present Illness:  Bailey [name] is a 58 y.o. very pleasant female patient who presents with the following:  She is here today for a DM recheck. She is about to run out of medication but is still taking everything She is fasting today.  She is actually taking just 500 mg BID of her metformin.   "I knew I was supposed to follow-up in July but I did not make it in."  She has noted some tingling in her feet for a couple of months.  She has just been checking her glucose for the last few weeks and has had some readings into the 500s at night. This is surprising given her recent good control Lab Results  Component Value Date   HGBA1C 6.3 01/15/2014    Patient Active Problem List   Diagnosis Date Noted  . Type II or unspecified type diabetes mellitus with unspecified complication, uncontrolled 02/16/2013    Past Medical History  Diagnosis Date  . Diabetes mellitus without complication   . Hypertension   . Arthritis     Past Surgical History  Procedure Laterality Date  . Cholecystectomy    . Uterine fibroid surgery      History  Substance Use Topics  . Smoking status: Current Every Day Smoker -- 1.00 packs/day for 20 years    Types: Cigarettes  . Smokeless tobacco: Not on file  . Alcohol Use: No    Family History  Problem Relation Age of Onset  . Diabetes Mother   . Heart disease Father   . Cancer Brother     pancreatic    No Known Allergies  Medication list has been reviewed and updated.  Current Outpatient Prescriptions on File Prior to Visit  Medication Sig Dispense Refill  . acetaminophen-codeine (TYLENOL #3) 300-30 MG per tablet Take 1-2 tablets by mouth every 4 (four) hours as needed  for moderate pain. 30 tablet 0  . atorvastatin (LIPITOR) 40 MG tablet Take 1 tablet (40 mg total) by mouth daily. 90 tablet 3  . butalbital-acetaminophen-caffeine (FIORICET, ESGIC) 50-325-40 MG per tablet Take 1 tablet by mouth 2 (two) times daily as needed for headache. 40 tablet 0  . cyclobenzaprine (FLEXERIL) 5 MG tablet Take 1 tablet (5 mg total) by mouth at bedtime. 30 tablet 5  . doxycycline (VIBRA-TABS) 100 MG tablet Take 1 tablet (100 mg total) by mouth 2 (two) times daily. 20 tablet 0  . glimepiride (AMARYL) 4 MG tablet Take 2 tablets (8 mg total) by mouth daily before breakfast. 180 tablet 2  . lisinopril (PRINIVIL,ZESTRIL) 20 MG tablet Take 1 tablet (20 mg total) by mouth daily. 90 tablet 2  . metFORMIN (GLUCOPHAGE) 500 MG tablet Take 2 tablets (1,000 mg total) by mouth 2 (two) times daily with a meal. PATIENT NEEDS OFFICE VISIT FOR ADDITIONAL REFILLS 120 tablet 0  . traMADol (ULTRAM) 50 MG tablet Take 1 tablet (50 mg total) by mouth every 8 (eight) hours as needed. 30 tablet 0   No current facility-administered medications on file prior to visit.    Review of Systems:  As per HPI- otherwise negative.   Physical Examination: Filed Vitals:   09/07/14 1000  BP:  130/70  Pulse: 111  Temp: 98 F (36.7 C)  Resp: 16   Filed Vitals:   09/07/14 1000  Height: 5' 6.5" (1.689 m)  Weight: 205 lb 6.4 oz (93.169 kg)   Body mass index is 32.66 kg/(m^2). Ideal Body Weight: Weight in (lb) to have BMI = 25: 156.9  GEN: WDWN, NAD, Non-toxic, A & O x 3, obese, looks well HEENT: Atraumatic, Normocephalic. Neck supple. No masses, No LAD. Ears and Nose: No external deformity. CV: RRR, No M/G/R. No JVD. No thrill. No extra heart sounds. PULM: CTA B, no wheezes, crackles, rhonchi. No retractions. No resp. distress. No accessory muscle use. ABD: S, NT, ND, +BS. No rebound. No HSM. EXTR: No c/c/e NEURO Normal gait.  PSYCH: Normally interactive. Conversant. Not depressed or anxious  appearing.  Calm demeanor.  Foot exam is normal   Results for orders placed or performed in visit on 09/07/14  Lipid panel  Result Value Ref Range   Cholesterol 148 0 - 200 mg/dL   Triglycerides 127 <150 mg/dL   HDL 42 >39 mg/dL   Total CHOL/HDL Ratio 3.5 Ratio   VLDL 25 0 - 40 mg/dL   LDL Cholesterol 81 0 - 99 mg/dL  Comprehensive metabolic panel  Result Value Ref Range   Sodium 138 135 - 145 mEq/L   Potassium 4.1 3.5 - 5.3 mEq/L   Chloride 104 96 - 112 mEq/L   CO2 22 19 - 32 mEq/L   Glucose, Bld 86 70 - 99 mg/dL   BUN 7 6 - 23 mg/dL   Creat 0.45 (L) 0.50 - 1.10 mg/dL   Total Bilirubin 0.3 0.2 - 1.2 mg/dL   Alkaline Phosphatase 106 39 - 117 U/L   AST 19 0 - 37 U/L   ALT 28 0 - 35 U/L   Total Protein 7.5 6.0 - 8.3 g/dL   Albumin 4.2 3.5 - 5.2 g/dL   Calcium 10.0 8.4 - 10.5 mg/dL  POCT glycosylated hemoglobin (Hb A1C)  Result Value Ref Range   Hemoglobin A1C 6.6      Assessment and Plan: Diabetes mellitus type 2 with complications - Plan: Lipid panel, Comprehensive metabolic panel, POCT glycosylated hemoglobin (Hb A1C), glimepiride (AMARYL) 4 MG tablet, metFORMIN (GLUCOPHAGE) 500 MG tablet  Immunization due - Plan: Flu Vaccine QUAD 36+ mos IM  Essential hypertension - Plan: lisinopril (PRINIVIL,ZESTRIL) 20 MG tablet  Hyperlipidemia - Plan: atorvastatin (LIPITOR) 40 MG tablet  Bailey [name] endorses some very high home glucose readings- in the 500s.  I am not sure if her meter is accurate as her A1c continues to look good.  She will continue more frequent home checks and contact me in about a week with an update.  Suggested that she bring in her meter to test against ours.  Refilled her medications, flu shot, labs as above.   Signed Lamar Blinks, MD

## 2014-09-07 NOTE — Telephone Encounter (Signed)
Pharm CB and stated that not only do they need One Touch Ultra on Rx but they also need directions. Resent Rx.

## 2014-09-07 NOTE — Addendum Note (Signed)
Addended by: Elwyn Reach A on: 09/07/2014 04:46 PM   Modules accepted: Orders

## 2014-09-07 NOTE — Telephone Encounter (Signed)
Pt states rx needs to be for one touch ultra instead of regular one touch. rx needs to be re-sent to [facility] on [address]. Call pt with questions if needed CB [phone]

## 2014-09-07 NOTE — Telephone Encounter (Signed)
Spoke to pharm, as long as test strips say ultra they will work with her machine. sent order for test strips to pharm.  Spoke to pt, she is aware.

## 2014-09-07 NOTE — Patient Instructions (Addendum)
Good to see you today- I will be in touch with your labs asap  As a diabetic, there are several things you can do to monitor your condition and maintain your health.  1. Check your feet daily for any skin breakdown 2. Exercise and keep track of your diet 3. Let us know before you run out of your medications 4. Get your annual flu shot, and ask if you need a pneumonia shot 5. Ask if you are up to date on your labs; you should have an A1c every 6 months, a urine protein test annually, and a cholesterol test annually.  Your doctor may decide to do labs more often if indicated 6. Take off your shoes and socks at each visit.  Be sure your doctor examines your feet.   7. Ask about your blood pressure.  Your goal is 130/ 80 or less 8. Get an annual eye exam.  Please ask your ophthalmologist to send [place] your report 9. Keep up with your dental cleanings and exams.    Please call me in one week with your glucose readings. Check 1-2x a day until we follow-up Please come and see me in 3 months- bring me your glucose readings then as well

## 2014-09-10 ENCOUNTER — Telehealth: Payer: Self-pay

## 2014-09-10 DIAGNOSIS — E118 Type 2 diabetes mellitus with unspecified complications: Secondary | ICD-10-CM

## 2014-09-10 MED ORDER — METFORMIN HCL 500 MG PO TABS: ORAL_TABLET | ORAL | Status: DC

## 2014-09-10 NOTE — Telephone Encounter (Signed)
Dr Lorelei Pont, I got a fax from [facility] asking for clarification of directions. Is it take 2 tabs po BID or 1-2 tabs po BID? Please take out the sig you do not want and resend. Thank you.

## 2014-12-12 ENCOUNTER — Ambulatory Visit (INDEPENDENT_AMBULATORY_CARE_PROVIDER_SITE_OTHER): Admitting: Emergency Medicine

## 2014-12-12 VITALS — BP 156/80 | HR 100 | Temp 98.1°F | Resp 18 | Ht 66.0 in | Wt 210.8 lb

## 2014-12-12 DIAGNOSIS — T23231A Burn of second degree of multiple right fingers (nail), not including thumb, initial encounter: Secondary | ICD-10-CM | POA: Diagnosis not present

## 2014-12-12 MED ORDER — SILVER SULFADIAZINE 1 % EX CREA: 1.0000 "application " | TOPICAL_CREAM | Freq: Every day | CUTANEOUS | Status: DC
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
## 2014-12-12 NOTE — Patient Instructions (Signed)
Burn Care Your skin is a natural barrier to infection. It is the largest organ of your body. Burns damage this natural protection. To help prevent infection, it is very important to follow your caregiver's instructions in the care of your burn. Burns are classified as:  First degree. There is only redness of the skin (erythema). No scarring is expected.  Second degree. There is blistering of the skin. Scarring may occur with deeper burns.  Third degree. All layers of the skin are injured, and scarring is expected. HOME CARE INSTRUCTIONS   Wash your hands well before changing your bandage.  Change your bandage as often as directed by your caregiver.  Remove the old bandage. If the bandage sticks, you may soak it off with cool, clean water.  Cleanse the burn thoroughly but gently with mild soap and water.  Pat the area dry with a clean, dry cloth.  Apply a thin layer of antibacterial cream to the burn.  Apply a clean bandage as instructed by your caregiver.  Keep the bandage as clean and dry as possible.  Elevate the affected area for the first 24 hours, then as instructed by your caregiver.  Only take over-the-counter or prescription medicines for pain, discomfort, or fever as directed by your caregiver. SEEK IMMEDIATE MEDICAL CARE IF:   You develop excessive pain.  You develop redness, tenderness, swelling, or red streaks near the burn.  The burned area develops yellowish-white fluid (pus) or a bad smell.  You have a fever. MAKE SURE YOU:   Understand these instructions.  Will watch your condition.  Will get help right away if you are not doing well or get worse. Document Released: 09/07/2005 Document Revised: 11/30/2011 Document Reviewed: 01/28/2011 ExitCare Patient Information 2015 [employer]. This information is not intended to replace advice given to you by your health care provider. Make sure you discuss any questions you have with your health care  provider.  

## 2015-02-05 ENCOUNTER — Emergency Department (HOSPITAL_COMMUNITY)
Admission: EM | Admit: 2015-02-05 | Discharge: 2015-02-05 | Disposition: A | Discharge status: Home / Self Care | Attending: Emergency Medicine | Admitting: Emergency Medicine

## 2015-02-05 ENCOUNTER — Encounter (HOSPITAL_COMMUNITY): Payer: Self-pay | Admitting: Emergency Medicine

## 2015-02-05 DIAGNOSIS — Z720 Tobacco use: Secondary | ICD-10-CM | POA: Diagnosis not present

## 2015-02-05 DIAGNOSIS — I951 Orthostatic hypotension: Secondary | ICD-10-CM

## 2015-02-05 DIAGNOSIS — R5383 Other fatigue: Secondary | ICD-10-CM | POA: Diagnosis not present

## 2015-02-05 DIAGNOSIS — R0789 Other chest pain: Secondary | ICD-10-CM | POA: Insufficient documentation

## 2015-02-05 DIAGNOSIS — R112 Nausea with vomiting, unspecified: Secondary | ICD-10-CM | POA: Diagnosis not present

## 2015-02-05 DIAGNOSIS — R197 Diarrhea, unspecified: Secondary | ICD-10-CM | POA: Insufficient documentation

## 2015-02-05 DIAGNOSIS — E119 Type 2 diabetes mellitus without complications: Secondary | ICD-10-CM | POA: Diagnosis not present

## 2015-02-05 DIAGNOSIS — I10 Essential (primary) hypertension: Secondary | ICD-10-CM | POA: Diagnosis not present

## 2015-02-05 DIAGNOSIS — R55 Syncope and collapse: Secondary | ICD-10-CM | POA: Diagnosis not present

## 2015-02-05 DIAGNOSIS — Z79899 Other long term (current) drug therapy: Secondary | ICD-10-CM | POA: Diagnosis not present

## 2015-02-05 DIAGNOSIS — Z8739 Personal history of other diseases of the musculoskeletal system and connective tissue: Secondary | ICD-10-CM | POA: Insufficient documentation

## 2015-02-05 LAB — CBG MONITORING, ED: Glucose-Capillary: 296 mg/dL — ABNORMAL HIGH (ref 65–99)

## 2015-02-05 LAB — URINALYSIS, ROUTINE W REFLEX MICROSCOPIC
GLUCOSE, UA: 100 mg/dL — AB
Hgb urine dipstick: NEGATIVE
Ketones, ur: NEGATIVE mg/dL
Leukocytes, UA: NEGATIVE
Nitrite: NEGATIVE
PROTEIN: 30 mg/dL — AB
Specific Gravity, Urine: 1.021 (ref 1.005–1.030)
Urobilinogen, UA: 0.2 mg/dL (ref 0.0–1.0)
pH: 5 (ref 5.0–8.0)

## 2015-02-05 LAB — CBC WITH DIFFERENTIAL/PLATELET
Basophils Absolute: 0 10*3/uL (ref 0.0–0.1)
Basophils Relative: 0 % (ref 0–1)
Eosinophils Absolute: 0.1 10*3/uL (ref 0.0–0.7)
Eosinophils Relative: 1 % (ref 0–5)
HCT: 40.9 % (ref 36.0–46.0)
Hemoglobin: 13.3 g/dL (ref 12.0–15.0)
Lymphocytes Relative: 19 % (ref 12–46)
Lymphs Abs: 1.6 10*3/uL (ref 0.7–4.0)
MCH: 22.9 pg — ABNORMAL LOW (ref 26.0–34.0)
MCHC: 32.5 g/dL (ref 30.0–36.0)
MCV: 70.4 fL — ABNORMAL LOW (ref 78.0–100.0)
Monocytes Absolute: 0.7 10*3/uL (ref 0.1–1.0)
Monocytes Relative: 9 % (ref 3–12)
NEUTROS ABS: 5.7 10*3/uL (ref 1.7–7.7)
NEUTROS PCT: 71 % (ref 43–77)
Platelets: 254 10*3/uL (ref 150–400)
RBC: 5.81 MIL/uL — AB (ref 3.87–5.11)
RDW: 16.3 % — ABNORMAL HIGH (ref 11.5–15.5)
WBC: 8 10*3/uL (ref 4.0–10.5)

## 2015-02-05 LAB — COMPREHENSIVE METABOLIC PANEL
ALBUMIN: 3.5 g/dL (ref 3.5–5.0)
ALK PHOS: 134 U/L — AB (ref 38–126)
ALT: 44 U/L (ref 14–54)
ANION GAP: 12 (ref 5–15)
AST: 34 U/L (ref 15–41)
BUN: 9 mg/dL (ref 6–20)
CHLORIDE: 99 mmol/L — AB (ref 101–111)
CO2: 24 mmol/L (ref 22–32)
Calcium: 9 mg/dL (ref 8.9–10.3)
Creatinine, Ser: 0.74 mg/dL (ref 0.44–1.00)
GFR calc Af Amer: >60 mL/min (ref >60)
GFR calc non Af Amer: >60 mL/min (ref >60)
Glucose, Bld: 308 mg/dL — ABNORMAL HIGH (ref 65–99)
Potassium: 3.5 mmol/L (ref 3.5–5.1)
Sodium: 135 mmol/L (ref 135–145)
Total Bilirubin: 0.4 mg/dL (ref 0.3–1.2)
Total Protein: 8.2 g/dL — ABNORMAL HIGH (ref 6.5–8.1)

## 2015-02-05 LAB — URINE MICROSCOPIC-ADD ON

## 2015-02-05 LAB — TROPONIN I
Troponin I: <0.03 ng/mL (ref <0.031)
Troponin I: <0.03 ng/mL (ref <0.031)

## 2015-02-05 LAB — LIPASE, BLOOD: LIPASE: 19 U/L — AB (ref 22–51)

## 2015-02-05 MED ORDER — SODIUM CHLORIDE 0.9 % IV SOLN
1000.0000 mL | INTRAVENOUS | Status: DC
  Administered 2015-02-05: 1000 mL via INTRAVENOUS

## 2015-02-05 MED ORDER — GI COCKTAIL ~~LOC~~
30.0000 mL | Freq: Once | ORAL | Status: AC
  Administered 2015-02-05: 30 mL via ORAL
  Filled 2015-02-05: qty 30

## 2015-02-05 MED ORDER — NITROGLYCERIN 0.4 MG SL SUBL
SUBLINGUAL_TABLET | SUBLINGUAL | Status: AC
  Administered 2015-02-05: 0.4 mg
  Filled 2015-02-05: qty 1

## 2015-02-05 MED ORDER — SODIUM CHLORIDE 0.9 % IV BOLUS (SEPSIS)
1000.0000 mL | Freq: Once | INTRAVENOUS | Status: AC
  Administered 2015-02-05: 1000 mL via INTRAVENOUS

## 2015-02-05 MED ORDER — METOCLOPRAMIDE HCL 10 MG PO TABS: 10.0000 mg | ORAL_TABLET | Freq: Four times a day (QID) | ORAL | Status: DC | PRN

## 2015-02-05 MED ORDER — LOPERAMIDE HCL 2 MG PO CAPS
4.0000 mg | ORAL_CAPSULE | Freq: Once | ORAL | Status: AC
  Administered 2015-02-05: 4 mg via ORAL
  Filled 2015-02-05: qty 2

## 2015-02-05 MED ORDER — ONDANSETRON HCL 4 MG/2ML IJ SOLN
4.0000 mg | Freq: Once | INTRAMUSCULAR | Status: AC
  Administered 2015-02-05: 4 mg via INTRAVENOUS
  Filled 2015-02-05: qty 2

## 2015-02-05 MED ORDER — SODIUM CHLORIDE 0.9 % IV SOLN
1000.0000 mL | Freq: Once | INTRAVENOUS | Status: AC
  Administered 2015-02-05: 1000 mL via INTRAVENOUS

## 2015-02-05 NOTE — Discharge Instructions (Signed)
Take loperamide (Imodium AD) as needed for diarrhea. ° °Nausea and Vomiting °Nausea is a sick feeling that often comes before throwing up (vomiting). Vomiting is a reflex where stomach contents come out of your mouth. Vomiting can cause severe loss of body fluids (dehydration). Children and elderly adults can become dehydrated quickly, especially if they also have diarrhea. Nausea and vomiting are symptoms of a condition or disease. It is important to find the cause of your symptoms. °CAUSES  °· Direct irritation of the stomach lining. This irritation can result from increased acid production (gastroesophageal reflux disease), infection, food poisoning, taking certain medicines (such as nonsteroidal anti-inflammatory drugs), alcohol use, or tobacco use. °· Signals from the brain. These signals could be caused by a headache, heat exposure, an inner ear disturbance, increased pressure in the brain from injury, infection, a tumor, or a concussion, pain, emotional stimulus, or metabolic problems. °· An obstruction in the gastrointestinal tract (bowel obstruction). °· Illnesses such as diabetes, hepatitis, gallbladder problems, appendicitis, kidney problems, cancer, sepsis, atypical symptoms of a heart attack, or eating disorders. °· Medical treatments such as chemotherapy and radiation. °· Receiving medicine that makes you sleep (general anesthetic) during surgery. °DIAGNOSIS °Your caregiver may ask for tests to be done if the problems do not improve after a few days. Tests may also be done if symptoms are severe or if the reason for the nausea and vomiting is not clear. Tests may include: °· Urine tests. °· Blood tests. °· Stool tests. °· Cultures (to look for evidence of infection). °· X-rays or other imaging studies. °Test results can help your caregiver make decisions about treatment or the need for additional tests. °TREATMENT °You need to stay well hydrated. Drink frequently but in small amounts. You may wish to  drink water, sports drinks, clear broth, or eat frozen ice pops or gelatin dessert to help stay hydrated. When you eat, eating slowly may help prevent nausea. There are also some antinausea medicines that may help prevent nausea. °HOME CARE INSTRUCTIONS  °· Take all medicine as directed by your caregiver. °· If you do not have an appetite, do not force yourself to eat. However, you must continue to drink fluids. °· If you have an appetite, eat a normal diet unless your caregiver tells you differently. °· Eat a variety of complex carbohydrates (rice, wheat, potatoes, bread), lean meats, yogurt, fruits, and vegetables. °· Avoid high-fat foods because they are more difficult to digest. °· Drink enough water and fluids to keep your urine clear or pale yellow. °· If you are dehydrated, ask your caregiver for specific rehydration instructions. Signs of dehydration may include: °· Severe thirst. °· Dry lips and mouth. °· Dizziness. °· Dark urine. °· Decreasing urine frequency and amount. °· Confusion. °· Rapid breathing or pulse. °SEEK IMMEDIATE MEDICAL CARE IF:  °· You have blood or brown flecks (like coffee grounds) in your vomit. °· You have black or bloody stools. °· You have a severe headache or stiff neck. °· You are confused. °· You have severe abdominal pain. °· You have chest pain or trouble breathing. °· You do not urinate at least once every 8 hours. °· You develop cold or clammy skin. °· You continue to vomit for longer than 24 to 48 hours. °· You have a fever. °MAKE SURE YOU:  °· Understand these instructions. °· Will watch your condition. °· Will get help right away if you are not doing well or get worse. °Document Released: 09/07/2005 Document Revised: 11/30/2011 Document Reviewed: 02/04/2011 °ExitCare® Patient   Information ©2015 [employer]. This information is not intended to replace advice given to you by your health care provider. Make sure you discuss any questions you have with your health care  provider. ° °Diarrhea °Diarrhea is frequent loose and watery bowel movements. It can cause you to feel weak and dehydrated. Dehydration can cause you to become tired and thirsty, have a dry mouth, and have decreased urination that often is dark yellow. Diarrhea is a sign of another problem, most often an infection that will not last long. In most cases, diarrhea typically lasts 2-3 days. However, it can last longer if it is a sign of something more serious. It is important to treat your diarrhea as directed by your caregiver to lessen or prevent future episodes of diarrhea. °CAUSES  °Some common causes include: °· Gastrointestinal infections caused by viruses, bacteria, or parasites. °· Food poisoning or food allergies. °· Certain medicines, such as antibiotics, chemotherapy, and laxatives. °· Artificial sweeteners and fructose. °· Digestive disorders. °HOME CARE INSTRUCTIONS °· Ensure adequate fluid intake (hydration): Have 1 cup (8 oz) of fluid for each diarrhea episode. Avoid fluids that contain simple sugars or sports drinks, fruit juices, whole milk products, and sodas. Your urine should be clear or pale yellow if you are drinking enough fluids. Hydrate with an oral rehydration solution that you can purchase at pharmacies, retail stores, and online. You can prepare an oral rehydration solution at home by mixing the following ingredients together: °¨  - tsp table salt. °¨ ¾ tsp baking soda. °¨  tsp salt substitute containing potassium chloride. °¨ 1  tablespoons sugar. °¨ 1 L (34 oz) of water. °· Certain foods and beverages may increase the speed at which food moves through the gastrointestinal (GI) tract. These foods and beverages should be avoided and include: °¨ Caffeinated and alcoholic beverages. °¨ High-fiber foods, such as raw fruits and vegetables, nuts, seeds, and whole grain breads and cereals. °¨ Foods and beverages sweetened with sugar alcohols, such as xylitol, sorbitol, and mannitol. °· Some foods  may be well tolerated and may help thicken stool including: °¨ Starchy foods, such as rice, toast, pasta, low-sugar cereal, oatmeal, grits, baked potatoes, crackers, and bagels. °¨ Bananas. °¨ Applesauce. °· Add probiotic-rich foods to help increase healthy bacteria in the GI tract, such as yogurt and fermented milk products. °· Wash your hands well after each diarrhea episode. °· Only take over-the-counter or prescription medicines as directed by your caregiver. °· Take a warm bath to relieve any burning or pain from frequent diarrhea episodes. °SEEK IMMEDIATE MEDICAL CARE IF:  °· You are unable to keep fluids down. °· You have persistent vomiting. °· You have blood in your stool, or your stools are black and tarry. °· You do not urinate in 6-8 hours, or there is only a small amount of very dark urine. °· You have abdominal pain that increases or localizes. °· You have weakness, dizziness, confusion, or light-headedness. °· You have a severe headache. °· Your diarrhea gets worse or does not get better. °· You have a fever or persistent symptoms for more than 2-3 days. °· You have a fever and your symptoms suddenly get worse. °MAKE SURE YOU:  °· Understand these instructions. °· Will watch your condition. °· Will get help right away if you are not doing well or get worse. °Document Released: 08/28/2002 Document Revised: 01/22/2014 Document Reviewed: 05/15/2012 °ExitCare® Patient Information ©2015 [employer]. This information is not intended to replace advice given to you   by your health care provider. Make sure you discuss any questions you have with your health care provider. ° °Metoclopramide tablets °What is this medicine? °METOCLOPRAMIDE (met oh kloe PRA mide) is used to treat the symptoms of gastroesophageal reflux disease (GERD) like heartburn. It is also used to treat people with slow emptying of the stomach and intestinal tract. °This medicine may be used for other purposes; ask your health care provider  or pharmacist if you have questions. °COMMON BRAND NAME(S): Reglan °What should I tell my health care provider before I take this medicine? °They need to know if you have any of these conditions: °-breast cancer °-depression °-diabetes °-heart failure °-high blood pressure °-kidney disease °-liver disease °-Parkinson's disease or a movement disorder °-pheochromocytoma °-seizures °-stomach obstruction, bleeding, or perforation °-an unusual or allergic reaction to metoclopramide, procainamide, sulfites, other medicines, foods, dyes, or preservatives °-pregnant or trying to get pregnant °-breast-feeding °How should I use this medicine? °Take this medicine by mouth with a glass of water. Follow the directions on the prescription label. Take this medicine on an empty stomach, about 30 minutes before eating. Take your doses at regular intervals. Do not take your medicine more often than directed. Do not stop taking except on the advice of your doctor or health care professional. °A special MedGuide will be given to you by the pharmacist with each prescription and refill. Be sure to read this information carefully each time. °Talk to your pediatrician regarding the use of this medicine in children. Special care may be needed. °Overdosage: If you think you have taken too much of this medicine contact a poison control center or emergency room at once. °NOTE: This medicine is only for you. Do not share this medicine with others. °What if I miss a dose? °If you miss a dose, take it as soon as you can. If it is almost time for your next dose, take only that dose. Do not take double or extra doses. °What may interact with this medicine? °-acetaminophen °-cyclosporine °-digoxin °-medicines for blood pressure °-medicines for diabetes, including insulin °-medicines for hay fever and other allergies °-medicines for depression, especially an Monoamine Oxidase Inhibitor (MAOI) °-medicines for Parkinson's disease, like  levodopa °-medicines for sleep or for pain °-tetracycline °This list may not describe all possible interactions. Give your health care provider a list of all the medicines, herbs, non-prescription drugs, or dietary supplements you use. Also tell them if you smoke, drink alcohol, or use illegal drugs. Some items may interact with your medicine. °What should I watch for while using this medicine? °It may take a few weeks for your stomach condition to start to get better. However, do not take this medicine for longer than 12 weeks. The longer you take this medicine, and the more you take it, the greater your chances are of developing serious side effects. If you are an elderly patient, a female patient, or you have diabetes, you may be at an increased risk for side effects from this medicine. Contact your doctor immediately if you start having movements you cannot control such as lip smacking, rapid movements of the tongue, involuntary or uncontrollable movements of the eyes, head, arms and legs, or muscle twitches and spasms. °Patients and their families should watch out for worsening depression or thoughts of suicide. Also watch out for any sudden or severe changes in feelings such as feeling anxious, agitated, panicky, irritable, hostile, aggressive, impulsive, severely restless, overly excited and hyperactive, or not being able to sleep. If this   happens, especially at the beginning of treatment or after a change in dose, call your doctor. Do not treat yourself for high fever. Ask your doctor or health care professional for advice. You may get drowsy or dizzy. Do not drive, use machinery, or do anything that needs mental alertness until you know how this drug affects you. Do not stand or sit up quickly, especially if you are an older patient. This reduces the risk of dizzy or fainting spells. Alcohol can make you more drowsy and dizzy. Avoid alcoholic drinks. What side effects may I notice from receiving this  medicine? Side effects that you should report to your doctor or health care professional as soon as possible: -allergic reactions like skin rash, itching or hives, swelling of the face, lips, or tongue -abnormal production of milk in females -breast enlargement in both males and females -change in the way you walk -difficulty moving, speaking or swallowing -drooling, lip smacking, or rapid movements of the tongue -excessive sweating -fever -involuntary or uncontrollable movements of the eyes, head, arms and legs -irregular heartbeat or palpitations -muscle twitches and spasms -unusually weak or tired Side effects that usually do not require medical attention (report to your doctor or health care professional if they continue or are bothersome): -change in sex drive or performance -depressed mood -diarrhea -difficulty sleeping -headache -menstrual changes -restless or nervous This list may not describe all possible side effects. Call your doctor for medical advice about side effects. You may report side effects to FDA at 1-800-FDA-1088. Where should I keep my medicine? Keep out of the reach of children. Store at room temperature between 20 and 25 degrees C (68 and 77 degrees F). Protect from light. Keep container tightly closed. Throw away any unused medicine after the expiration date. NOTE: This sheet is a summary. It may not cover all possible information. If you have questions about this medicine, talk to your doctor, pharmacist, or health care provider.  2015, [employer]. (2012-01-05 13:04:38)

## 2015-02-05 NOTE — ED Provider Notes (Addendum)
CSN: [id number]     Arrival date & time 02/05/15  0252 History   First MD Initiated Contact with Patient 02/05/15 0322     This chart was scribed for Bailey Fuel, MD by Forrestine Him, ED Scribe. This patient was seen in room A08C/A08C and the patient's care was started 3:24 AM.   Chief Complaint  Patient presents with  . Emesis  . Loss of Consciousness   The history is provided by the patient. No language interpreter was used.    HPI Comments: Bailey [name] is a 59 y.o. female with a PMHx of DM and HTN who presents to the Emergency Department here after an episode of loss of consciousness earlier this evening while at work. Pt states she woke up on the ground and could not recall what happened. She also reports several episodes of diarrhea and multiple episodes of vomiting. [name] has attempted OTC anti-diarrheal medication without any improvement for symptoms. No recent fever, chills, chest pain, or SOB. Pt admits she does not check her blood sugar regularly at home as she should. No known allergies to medications.  Past Medical History  Diagnosis Date  . Diabetes mellitus without complication   . Hypertension   . Arthritis    Past Surgical History  Procedure Laterality Date  . Cholecystectomy    . Uterine fibroid surgery     Family History  Problem Relation Age of Onset  . Diabetes Mother   . Heart disease Father   . Cancer Brother     pancreatic   History  Substance Use Topics  . Smoking status: Current Every Day Smoker -- 1.00 packs/day for 20 years    Types: Cigarettes  . Smokeless tobacco: Not on file  . Alcohol Use: No   OB History    No data available     Review of Systems  Constitutional: Positive for fatigue. Negative for fever and chills.  Respiratory: Negative for cough and shortness of breath.   Cardiovascular: Negative for chest pain.  Gastrointestinal: Positive for nausea, vomiting and diarrhea. Negative for abdominal pain.  Skin: Negative for  rash.  Neurological: Positive for syncope.  Psychiatric/Behavioral: Negative for confusion.      Allergies  Review of patient's allergies indicates no known allergies.  Home Medications   Prior to Admission medications   Medication Sig Start Date End Date Taking? Authorizing Provider  acetaminophen-codeine (TYLENOL #3) 300-30 MG per tablet Take 1-2 tablets by mouth every 4 (four) hours as needed for moderate pain. Patient not taking: Reported on 12/12/2014 12/04/13   Elby Beck, FNP  atorvastatin (LIPITOR) 40 MG tablet Take 1 tablet (40 mg total) by mouth daily. 09/07/14   Gay Filler Copland, MD  butalbital-acetaminophen-caffeine (FIORICET, ESGIC) 50-325-40 MG per tablet Take 1 tablet by mouth 2 (two) times daily as needed for headache. Patient not taking: Reported on 12/12/2014 12/04/13   Elby Beck, FNP  cyclobenzaprine (FLEXERIL) 5 MG tablet Take 1 tablet (5 mg total) by mouth at bedtime. 02/28/14   Robyn Haber, MD  glimepiride (AMARYL) 4 MG tablet Take 2 tablets (8 mg total) by mouth daily before breakfast. 09/07/14   Gay Filler Copland, MD  glucose blood test strip Test blood sugar daily. Dx code: E11.8. 09/07/14   Darreld Mclean, MD  lisinopril (PRINIVIL,ZESTRIL) 20 MG tablet Take 1 tablet (20 mg total) by mouth daily. 09/07/14   Darreld Mclean, MD  metFORMIN (GLUCOPHAGE) 500 MG tablet Take 1-2 tablets twice a day 09/10/14  Gay Filler Copland, MD  silver sulfADIAZINE (SILVADENE) 1 % cream Apply 1 application topically daily. 12/12/14   Roselee Culver, MD  traMADol (ULTRAM) 50 MG tablet Take 1 tablet (50 mg total) by mouth every 8 (eight) hours as needed. Patient not taking: Reported on 12/12/2014 12/04/13   Elby Beck, FNP   Triage Vitals: BP 115/52 mmHg  Pulse 99  Temp(Src) 98.2 F (36.8 C) (Oral)  Resp 20  Ht 5\' 6"  (1.676 m)  Wt 205 lb (92.987 kg)  BMI 33.10 kg/m2  SpO2 100%   Physical Exam  Constitutional: She is oriented to person, place, and  time. She appears well-developed and well-nourished. No distress.  HENT:  Head: Normocephalic and atraumatic.  Eyes: EOM are normal. Pupils are equal, round, and reactive to light.  Neck: Normal range of motion. Neck supple. No JVD present.  Cardiovascular: Normal rate, regular rhythm and normal heart sounds.   No murmur heard. Pulmonary/Chest: Effort normal and breath sounds normal. She has no wheezes. She has no rales. She exhibits no tenderness.  Abdominal: Soft. She exhibits no distension and no mass. There is no tenderness.  Decreased bowel sounds   Musculoskeletal: Normal range of motion. She exhibits no edema.  Lymphadenopathy:    She has no cervical adenopathy.  Neurological: She is alert and oriented to person, place, and time. No cranial nerve deficit. She exhibits normal muscle tone. Coordination normal.  Skin: Skin is warm and dry. No rash noted.  Psychiatric: She has a normal mood and affect. Her behavior is normal. Judgment and thought content normal.  Nursing note and vitals reviewed.   ED Course  Procedures (including critical care time)  DIAGNOSTIC STUDIES: Oxygen Saturation is 100% on RA, Normal by my interpretation.    COORDINATION OF CARE: 3:29 AM- Will give Zofran, Imodium, and fluids. Will order CBG, CBC, CMP, Lipase, and Urinalysis. Discussed treatment plan with pt at bedside and pt agreed to plan.     Labs Review Results for orders placed or performed during the hospital encounter of 02/05/15  CBC with Differential  Result Value Ref Range   WBC 8.0 4.0 - 10.5 K/uL   RBC 5.81 (H) 3.87 - 5.11 MIL/uL   Hemoglobin 13.3 12.0 - 15.0 g/dL   HCT 40.9 36.0 - 46.0 %   MCV 70.4 (L) 78.0 - 100.0 fL   MCH 22.9 (L) 26.0 - 34.0 pg   MCHC 32.5 30.0 - 36.0 g/dL   RDW 16.3 (H) 11.5 - 15.5 %   Platelets 254 150 - 400 K/uL   Neutrophils Relative % 71 43 - 77 %   Neutro Abs 5.7 1.7 - 7.7 K/uL   Lymphocytes Relative 19 12 - 46 %   Lymphs Abs 1.6 0.7 - 4.0 K/uL    Monocytes Relative 9 3 - 12 %   Monocytes Absolute 0.7 0.1 - 1.0 K/uL   Eosinophils Relative 1 0 - 5 %   Eosinophils Absolute 0.1 0.0 - 0.7 K/uL   Basophils Relative 0 0 - 1 %   Basophils Absolute 0.0 0.0 - 0.1 K/uL  Comprehensive metabolic panel  Result Value Ref Range   Sodium 135 135 - 145 mmol/L   Potassium 3.5 3.5 - 5.1 mmol/L   Chloride 99 (L) 101 - 111 mmol/L   CO2 24 22 - 32 mmol/L   Glucose, Bld 308 (H) 65 - 99 mg/dL   BUN 9 6 - 20 mg/dL   Creatinine, Ser 0.74 0.44 - 1.00 mg/dL  Calcium 9.0 8.9 - 10.3 mg/dL   Total Protein 8.2 (H) 6.5 - 8.1 g/dL   Albumin 3.5 3.5 - 5.0 g/dL   AST 34 15 - 41 U/L   ALT 44 14 - 54 U/L   Alkaline Phosphatase 134 (H) 38 - 126 U/L   Total Bilirubin 0.4 0.3 - 1.2 mg/dL   GFR calc non Af Amer >60 >60 mL/min   GFR calc Af Amer >60 >60 mL/min   Anion gap 12 5 - 15  Lipase, blood  Result Value Ref Range   Lipase 19 (L) 22 - 51 U/L  Urinalysis, Routine w reflex microscopic  Result Value Ref Range   Color, Urine AMBER (A) YELLOW   APPearance CLOUDY (A) CLEAR   Specific Gravity, Urine 1.021 1.005 - 1.030   pH 5.0 5.0 - 8.0   Glucose, UA 100 (A) NEGATIVE mg/dL   Hgb urine dipstick NEGATIVE NEGATIVE   Bilirubin Urine SMALL (A) NEGATIVE   Ketones, ur NEGATIVE NEGATIVE mg/dL   Protein, ur 30 (A) NEGATIVE mg/dL   Urobilinogen, UA 0.2 0.0 - 1.0 mg/dL   Nitrite NEGATIVE NEGATIVE   Leukocytes, UA NEGATIVE NEGATIVE  Urine microscopic-add on  Result Value Ref Range   Squamous Epithelial / LPF FEW (A) RARE   WBC, UA 3-6 <3 WBC/hpf   RBC / HPF 0-2 <3 RBC/hpf   Bacteria, UA RARE RARE   Casts HYALINE CASTS (A) NEGATIVE   Urine-Other MUCOUS PRESENT   CBG monitoring, ED  Result Value Ref Range   Glucose-Capillary 296 (H) 65 - 99 mg/dL    EKG Interpretation   Date/Time:  Tuesday Feb 05 2015 03:07:19 EDT Ventricular Rate:  105 PR Interval:  134 QRS Duration: 72 QT Interval:  348 QTC Calculation: 459 R Axis:   -2 Text Interpretation:  Sinus  tachycardia Right atrial enlargement Left  ventricular hypertrophy with repolarization abnormality Abnormal ECG No  old tracing to compare Confirmed by [facility]  MD, Makeshia Seat ([id number]) on 02/05/2015  3:13:29 AM   EKG Interpretation   Date/Time:  Tuesday Feb 05 2015 05:54:46 EDT Ventricular Rate:  95 PR Interval:  158 QRS Duration: 86 QT Interval:  377 QTC Calculation: 474 R Axis:   35 Text Interpretation:  Sinus rhythm Nonspecific T abnormalities, lateral  leads When compared with ECG of 02/05/2015, Nonspecific T wave abnormality  is now Present Confirmed by [facility]  MD, Niko Jakel ([id number]) on 02/05/2015 6:05:13  AM        MDM   Final diagnoses:  Nausea vomiting and diarrhea  Orthostatic syncope  Chest discomfort    Nausea, vomiting, diarrhea in pattern consistent with viral gastroenteritis. Orthostatic vital signs show significant drop in blood pressure with standing, so her syncopal episode is most likely related to dehydration with orthostasis. She is given IV fluids, IV ondansetron, and oral loperamide. Following this, she is feeling considerably better. Laboratory workup is unremarkable. She is discharged with prescription for metoclopramide and is otherwise use over-the-counter loperamide for diarrhea.  I personally performed the services described in this documentation, which was scribed in my presence. The recorded information has been reviewed and is accurate.      Bailey Fuel, MD 63/01/60 1093  Patient was getting ready to be discharged and stated that she started having a heavy feeling in her lower sternal area. She states this is similar to her acid reflux pain. ECG is obtained showing some slight variability in ST and T changes compared with prior ECG. She will be given a  trial of a GI cocktail but anticipate needing to hold her here for at least 2 troponin levels.  She had good relief of discomfort with GI cocktail. First troponin is pending. Case will be signed out to Dr.  Aline Brochure.  Bailey Fuel, MD 96/04/54 0981

## 2015-02-05 NOTE — ED Notes (Signed)
Pt reports N/VD since yesterday, pt reports she passed out at work tonight, pt states she woke up on the floor, does not remember what happened, discloses that she doesn't check her blood glucose like she should.

## 2015-02-05 NOTE — ED Notes (Signed)
Woke patient to discharge.  Patient stated she was having "chest pressure"    Dr Roxanne Mins notified.

## 2015-02-05 NOTE — ED Provider Notes (Signed)
11:52 AM  Delta troponin negative. The patient continues to appear well on exam. She is comfortable going home. Return to cautious provided.  Clinical Impression 1. Nausea vomiting and diarrhea   2. Orthostatic syncope   3. Chest discomfort      Pamella Pert, MD 02/05/15 1153

## 2015-02-05 NOTE — ED Notes (Signed)
Carb modified, Heart healthy diet breakfast tray ordered

## 2015-02-05 NOTE — ED Notes (Signed)
Patient stated she felt like this chest pressure was her reflux.  Stated it usually gets better when she sits up.

## 2015-02-07 ENCOUNTER — Ambulatory Visit (INDEPENDENT_AMBULATORY_CARE_PROVIDER_SITE_OTHER): Admitting: Emergency Medicine

## 2015-02-07 VITALS — BP 134/80 | HR 104 | Temp 98.3°F | Resp 18 | Ht 65.75 in | Wt 208.4 lb

## 2015-02-07 DIAGNOSIS — E118 Type 2 diabetes mellitus with unspecified complications: Secondary | ICD-10-CM | POA: Diagnosis not present

## 2015-02-07 DIAGNOSIS — G4700 Insomnia, unspecified: Secondary | ICD-10-CM

## 2015-02-07 DIAGNOSIS — A084 Viral intestinal infection, unspecified: Secondary | ICD-10-CM | POA: Diagnosis not present

## 2015-02-07 DIAGNOSIS — E1165 Type 2 diabetes mellitus with hyperglycemia: Secondary | ICD-10-CM | POA: Diagnosis not present

## 2015-02-07 DIAGNOSIS — M542 Cervicalgia: Secondary | ICD-10-CM | POA: Diagnosis not present

## 2015-02-07 DIAGNOSIS — IMO0002 Reserved for concepts with insufficient information to code with codable children: Secondary | ICD-10-CM

## 2015-02-07 LAB — POCT GLYCOSYLATED HEMOGLOBIN (HGB A1C): HEMOGLOBIN A1C: 6.4

## 2015-02-07 MED ORDER — LOPERAMIDE HCL 2 MG PO TABS: ORAL_TABLET | ORAL | Status: DC

## 2015-02-07 MED ORDER — CYCLOBENZAPRINE HCL 5 MG PO TABS: 5.0000 mg | ORAL_TABLET | Freq: Every day | ORAL | Status: DC

## 2015-02-07 MED ORDER — ONDANSETRON 8 MG PO TBDP: 8.0000 mg | ORAL_TABLET | Freq: Three times a day (TID) | ORAL | Status: DC | PRN

## 2015-02-07 NOTE — Patient Instructions (Signed)
Viral Gastroenteritis Viral gastroenteritis is also known as stomach flu. This condition affects the stomach and intestinal tract. It can cause sudden diarrhea and vomiting. The illness typically lasts 3 to 8 days. Most people develop an immune response that eventually gets rid of the virus. While this natural response develops, the virus can make you quite ill. CAUSES  Many different viruses can cause gastroenteritis, such as rotavirus or noroviruses. You can catch one of these viruses by consuming contaminated food or water. You may also catch a virus by sharing utensils or other personal items with an infected person or by touching a contaminated surface. SYMPTOMS  The most common symptoms are diarrhea and vomiting. These problems can cause a severe loss of body fluids (dehydration) and a body salt (electrolyte) imbalance. Other symptoms may include:  Fever.  Headache.  Fatigue.  Abdominal pain. DIAGNOSIS  Your caregiver can usually diagnose viral gastroenteritis based on your symptoms and a physical exam. A stool sample may also be taken to test for the presence of viruses or other infections. TREATMENT  This illness typically goes away on its own. Treatments are aimed at rehydration. The most serious cases of viral gastroenteritis involve vomiting so severely that you are not able to keep fluids down. In these cases, fluids must be given through an intravenous line (IV). HOME CARE INSTRUCTIONS   Drink enough fluids to keep your urine clear or pale yellow. Drink small amounts of fluids frequently and increase the amounts as tolerated.  Ask your caregiver for specific rehydration instructions.  Avoid:  Foods high in sugar.  Alcohol.  Carbonated drinks.  Tobacco.  Juice.  Caffeine drinks.  Extremely hot or cold fluids.  Fatty, greasy foods.  Too much intake of anything at one time.  Dairy products until 24 to 48 hours after diarrhea stops.  You may consume probiotics.  Probiotics are active cultures of beneficial bacteria. They may lessen the amount and number of diarrheal stools in adults. Probiotics can be found in yogurt with active cultures and in supplements.  Wash your hands well to avoid spreading the virus.  Only take over-the-counter or prescription medicines for pain, discomfort, or fever as directed by your caregiver. Do not give aspirin to children. Antidiarrheal medicines are not recommended.  Ask your caregiver if you should continue to take your regular prescribed and over-the-counter medicines.  Keep all follow-up appointments as directed by your caregiver. SEEK IMMEDIATE MEDICAL CARE IF:   You are unable to keep fluids down.  You do not urinate at least once every 6 to 8 hours.  You develop shortness of breath.  You notice blood in your stool or vomit. This may look like coffee grounds.  You have abdominal pain that increases or is concentrated in one small area (localized).  You have persistent vomiting or diarrhea.  You have a fever.  The patient is a child younger than 3 months, and he or she has a fever.  The patient is a child older than 3 months, and he or she has a fever and persistent symptoms.  The patient is a child older than 3 months, and he or she has a fever and symptoms suddenly get worse.  The patient is a baby, and he or she has no tears when crying. MAKE SURE YOU:   Understand these instructions.  Will watch your condition.  Will get help right away if you are not doing well or get worse. Document Released: 09/07/2005 Document Revised: 11/30/2011 Document Reviewed: 06/24/2011   ExitCare Patient Information 2015 [place]. This information is not intended to replace advice given to you by your health care provider. Make sure you discuss any questions you have with your health care provider. Clear Liquid Diet A clear liquid diet is a short-term diet that is prescribed to provide the necessary fluid and  basic energy you need when you can have nothing else. The clear liquid diet consists of liquids or solids that will become liquid at room temperature. You should be able to see through the liquid. There are many reasons that you may be restricted to clear liquids, such as:  When you have a sudden-onset (acute) condition that occurs before or after surgery.  To help your body slowly get adjusted to food again after a long period when you were unable to have food.  Replacement of fluids when you have a diarrheal disease.  When you are going to have certain exams, such as a colonoscopy, in which instruments are inserted inside your body to look at parts of your digestive system. WHAT CAN I HAVE? A clear liquid diet does not provide all the nutrients you need. It is important to choose a variety of the following items to get as many nutrients as possible:  Vegetable juices that do not have pulp.  Fruit juices and fruit drinks that do not have pulp.  Coffee (regular or decaffeinated), tea, or soda at the discretion of your health care provider.  Clear bouillon, broth, or strained broth-based soups.  High-protein and flavored gelatins.  Sugar or honey.  Ices or frozen ice pops that do not contain milk. If you are not sure whether you can have certain items, you should ask your health care provider. You may also ask your health care provider if there are any other clear liquid options. Document Released: 09/07/2005 Document Revised: 09/12/2013 Document Reviewed: 08/04/2013 [facility] Patient Information 2015 [place], [place]. This information is not intended to replace advice given to you by your health care provider. Make sure you discuss any questions you have with your health care provider.
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
## 2015-02-27 ENCOUNTER — Encounter: Payer: Self-pay | Admitting: *Deleted

## 2015-03-22 DIAGNOSIS — N6019 Diffuse cystic mastopathy of unspecified breast: Secondary | ICD-10-CM

## 2015-03-22 HISTORY — DX: Diffuse cystic mastopathy of unspecified breast: N60.19

## 2015-04-01 ENCOUNTER — Other Ambulatory Visit: Payer: Self-pay

## 2015-04-01 DIAGNOSIS — Z9289 Personal history of other medical treatment: Secondary | ICD-10-CM

## 2015-04-05 ENCOUNTER — Ambulatory Visit
Admission: RE | Admit: 2015-04-05 | Discharge: 2015-04-05 | Disposition: A | Discharge status: Home / Self Care | Source: Ambulatory Visit

## 2015-04-05 DIAGNOSIS — Z9289 Personal history of other medical treatment: Secondary | ICD-10-CM

## 2015-04-09 ENCOUNTER — Other Ambulatory Visit: Payer: Self-pay | Admitting: Family Medicine

## 2015-04-09 DIAGNOSIS — R928 Other abnormal and inconclusive findings on diagnostic imaging of breast: Secondary | ICD-10-CM

## 2015-04-17 ENCOUNTER — Ambulatory Visit
Admission: RE | Admit: 2015-04-17 | Discharge: 2015-04-17 | Disposition: A | Discharge status: Home / Self Care | Source: Ambulatory Visit | Attending: Family Medicine | Admitting: Family Medicine

## 2015-04-17 ENCOUNTER — Other Ambulatory Visit: Payer: Self-pay | Admitting: Family Medicine

## 2015-04-17 DIAGNOSIS — R928 Other abnormal and inconclusive findings on diagnostic imaging of breast: Secondary | ICD-10-CM

## 2015-04-17 DIAGNOSIS — N6310 Unspecified lump in the right breast, unspecified quadrant: Secondary | ICD-10-CM

## 2015-04-19 ENCOUNTER — Other Ambulatory Visit: Payer: Self-pay | Admitting: Family Medicine

## 2015-04-19 ENCOUNTER — Ambulatory Visit
Admission: RE | Admit: 2015-04-19 | Discharge: 2015-04-19 | Disposition: A | Discharge status: Home / Self Care | Source: Ambulatory Visit | Attending: Family Medicine | Admitting: Family Medicine

## 2015-04-19 DIAGNOSIS — N6310 Unspecified lump in the right breast, unspecified quadrant: Secondary | ICD-10-CM

## 2015-04-19 HISTORY — PX: BREAST BIOPSY: SHX20

## 2015-07-20 ENCOUNTER — Other Ambulatory Visit: Payer: Self-pay | Admitting: Family Medicine

## 2015-07-26 ENCOUNTER — Ambulatory Visit (INDEPENDENT_AMBULATORY_CARE_PROVIDER_SITE_OTHER): Admitting: Physician Assistant

## 2015-07-26 VITALS — BP 150/80 | HR 92 | Temp 98.3°F | Resp 18 | Ht 66.0 in | Wt 211.0 lb

## 2015-07-26 DIAGNOSIS — R1013 Epigastric pain: Secondary | ICD-10-CM | POA: Diagnosis not present

## 2015-07-26 DIAGNOSIS — M545 Low back pain, unspecified: Secondary | ICD-10-CM

## 2015-07-26 DIAGNOSIS — Z1211 Encounter for screening for malignant neoplasm of colon: Secondary | ICD-10-CM | POA: Diagnosis not present

## 2015-07-26 DIAGNOSIS — Z114 Encounter for screening for human immunodeficiency virus [HIV]: Secondary | ICD-10-CM | POA: Diagnosis not present

## 2015-07-26 DIAGNOSIS — R152 Fecal urgency: Secondary | ICD-10-CM

## 2015-07-26 DIAGNOSIS — E785 Hyperlipidemia, unspecified: Secondary | ICD-10-CM | POA: Diagnosis not present

## 2015-07-26 DIAGNOSIS — E119 Type 2 diabetes mellitus without complications: Secondary | ICD-10-CM | POA: Diagnosis not present

## 2015-07-26 DIAGNOSIS — G4726 Circadian rhythm sleep disorder, shift work type: Secondary | ICD-10-CM

## 2015-07-26 DIAGNOSIS — Z1159 Encounter for screening for other viral diseases: Secondary | ICD-10-CM

## 2015-07-26 DIAGNOSIS — Z23 Encounter for immunization: Secondary | ICD-10-CM

## 2015-07-26 DIAGNOSIS — I10 Essential (primary) hypertension: Secondary | ICD-10-CM | POA: Diagnosis not present

## 2015-07-26 DIAGNOSIS — R197 Diarrhea, unspecified: Secondary | ICD-10-CM

## 2015-07-26 DIAGNOSIS — K219 Gastro-esophageal reflux disease without esophagitis: Secondary | ICD-10-CM | POA: Insufficient documentation

## 2015-07-26 DIAGNOSIS — R718 Other abnormality of red blood cells: Secondary | ICD-10-CM

## 2015-07-26 LAB — POCT CBC
Granulocyte percent: 51.4 %G (ref 37–80)
HEMATOCRIT: 41.5 % (ref 37.7–47.9)
HEMOGLOBIN: 13.3 g/dL (ref 12.2–16.2)
LYMPH, POC: 4 — AB (ref 0.6–3.4)
MCH, POC: 22.8 pg — AB (ref 27–31.2)
MCHC: 32.1 g/dL (ref 31.8–35.4)
MCV: 70.8 fL — AB (ref 80–97)
MID (cbc): 0.6 (ref 0–0.9)
MPV: 8.2 fL (ref 0–99.8)
PLATELET COUNT, POC: 275 10*3/uL (ref 142–424)
POC Granulocyte: 4.8 (ref 2–6.9)
POC LYMPH %: 42.2 % (ref 10–50)
POC MID %: 6.4 %M (ref 0–12)
RBC: 5.86 M/uL — AB (ref 4.04–5.48)
RDW, POC: 17.7 %
WBC: 9.4 10*3/uL (ref 4.6–10.2)

## 2015-07-26 LAB — POCT URINALYSIS DIP (MANUAL ENTRY)
Bilirubin, UA: NEGATIVE
Glucose, UA: NEGATIVE
Ketones, POC UA: NEGATIVE
Leukocytes, UA: NEGATIVE
NITRITE UA: NEGATIVE
PH UA: 5
Protein Ur, POC: NEGATIVE
RBC UA: NEGATIVE
SPEC GRAV UA: 1.02
UROBILINOGEN UA: 0.2

## 2015-07-26 LAB — GLUCOSE, POCT (MANUAL RESULT ENTRY): POC GLUCOSE: 140 mg/dL — AB (ref 70–99)

## 2015-07-26 LAB — MICROALBUMIN, URINE: MICROALB UR: 1.1 mg/dL

## 2015-07-26 LAB — POCT GLYCOSYLATED HEMOGLOBIN (HGB A1C): HEMOGLOBIN A1C: 6.4

## 2015-07-26 MED ORDER — ATORVASTATIN CALCIUM 40 MG PO TABS: 40.0000 mg | ORAL_TABLET | Freq: Every day | ORAL | Status: DC

## 2015-07-26 MED ORDER — HYDROCHLOROTHIAZIDE 12.5 MG PO CAPS
12.5000 mg | ORAL_CAPSULE | Freq: Every day | ORAL | Status: DC
Start: 2015-07-26 — End: 2015-09-26

## 2015-07-26 MED ORDER — LISINOPRIL 20 MG PO TABS: 20.0000 mg | ORAL_TABLET | Freq: Every day | ORAL | Status: DC

## 2015-07-26 MED ORDER — ZOSTER VACCINE LIVE 19400 UNT/0.65ML ~~LOC~~ SOLR: 0.6500 mL | Freq: Once | SUBCUTANEOUS | Status: DC

## 2015-07-26 MED ORDER — PANTOPRAZOLE SODIUM 40 MG PO TBEC: 40.0000 mg | DELAYED_RELEASE_TABLET | Freq: Every day | ORAL | Status: DC

## 2015-07-26 MED ORDER — METFORMIN HCL ER (OSM) 1000 MG PO TB24: 1000.0000 mg | ORAL_TABLET | Freq: Two times a day (BID) | ORAL | Status: DC

## 2015-07-26 MED ORDER — GLIMEPIRIDE 4 MG PO TABS: 8.0000 mg | ORAL_TABLET | Freq: Every day | ORAL | Status: DC

## 2015-07-26 NOTE — Patient Instructions (Addendum)
Influenza Virus Vaccine injection What is this medicine? INFLUENZA VIRUS VACCINE (in floo EN zuh VAHY ruhs vak SEEN) helps to reduce the risk of getting influenza also known as the flu. The vaccine only helps protect you against some strains of the flu. This medicine may be used for other purposes; ask your health care provider or pharmacist if you have questions. What should I tell my health care provider before I take this medicine? They need to know if you have any of these conditions: -bleeding disorder like hemophilia -fever or infection -Guillain-Barre syndrome or other neurological problems -immune system problems -infection with the human immunodeficiency virus (HIV) or AIDS -low blood platelet counts -multiple sclerosis -an unusual or allergic reaction to influenza virus vaccine, latex, other medicines, foods, dyes, or preservatives. Different brands of vaccines contain different allergens. Some may contain latex or eggs. Talk to your doctor about your allergies to make sure that you get the right vaccine. -pregnant or trying to get pregnant -breast-feeding How should I use this medicine? This vaccine is for injection into a muscle or under the skin. It is given by a health care professional. A copy of Vaccine Information Statements will be given before each vaccination. Read this sheet carefully each time. The sheet may change frequently. Talk to your healthcare provider to see which vaccines are right for you. Some vaccines should not be used in all age groups. Overdosage: If you think you have taken too much of this medicine contact a poison control center or emergency room at once. NOTE: This medicine is only for you. Do not share this medicine with others. What if I miss a dose? This does not apply. What may interact with this medicine? -chemotherapy or radiation therapy -medicines that lower your immune system like etanercept, anakinra, infliximab, and adalimumab -medicines  that treat or prevent blood clots like warfarin -phenytoin -steroid medicines like prednisone or cortisone -theophylline -vaccines This list may not describe all possible interactions. Give your health care provider a list of all the medicines, herbs, non-prescription drugs, or dietary supplements you use. Also tell them if you smoke, drink alcohol, or use illegal drugs. Some items may interact with your medicine. What should I watch for while using this medicine? Report any side effects that do not go away within 3 days to your doctor or health care professional. Call your health care provider if any unusual symptoms occur within 6 weeks of receiving this vaccine. You may still catch the flu, but the illness is not usually as bad. You cannot get the flu from the vaccine. The vaccine will not protect against colds or other illnesses that may cause fever. The vaccine is needed every year. What side effects may I notice from receiving this medicine? Side effects that you should report to your doctor or health care professional as soon as possible: -allergic reactions like skin rash, itching or hives, swelling of the face, lips, or tongue Side effects that usually do not require medical attention (report to your doctor or health care professional if they continue or are bothersome): -fever -headache -muscle aches and pains -pain, tenderness, redness, or swelling at the injection site -tiredness This list may not describe all possible side effects. Call your doctor for medical advice about side effects. You may report side effects to FDA at 1-800-FDA-1088. Where should I keep my medicine? The vaccine will be given by a health care professional in a clinic, pharmacy, doctor's office, or other health care setting. You will not   be given vaccine doses to store at home. NOTE: This sheet is a summary. It may not cover all possible information. If you have questions about this medicine, talk to your  doctor, pharmacist, or health care provider.    2016, [employer] Standard. (2015-03-29 10:07:28)   I will contact you with your lab results as soon as they are available.   If you have not heard from me in 2 weeks, please contact me.  The fastest way to get your results is to register for My Chart (see the instructions on the last page of this printout).  Please take the acetaminophen (Tylenol) once daily for neck and back pain. If that does not adequately control your discomfort, we can change to a prescription anti-inflammatory product.
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
## 2015-07-27 LAB — COMPREHENSIVE METABOLIC PANEL
ALBUMIN: 3.9 g/dL (ref 3.6–5.1)
ALT: 36 U/L — AB (ref 6–29)
AST: 22 U/L (ref 10–35)
Alkaline Phosphatase: 123 U/L (ref 33–130)
BUN: 6 mg/dL — AB (ref 7–25)
CALCIUM: 9.8 mg/dL (ref 8.6–10.4)
CHLORIDE: 102 mmol/L (ref 98–110)
CO2: 23 mmol/L (ref 20–31)
Creat: 0.52 mg/dL (ref 0.50–0.99)
Glucose, Bld: 129 mg/dL — ABNORMAL HIGH (ref 65–99)
POTASSIUM: 4.3 mmol/L (ref 3.5–5.3)
Sodium: 137 mmol/L (ref 135–146)
TOTAL PROTEIN: 7.2 g/dL (ref 6.1–8.1)
Total Bilirubin: 0.3 mg/dL (ref 0.2–1.2)

## 2015-07-27 LAB — LIPID PANEL
Cholesterol: 167 mg/dL (ref 125–200)
HDL: 52 mg/dL (ref >=46)
LDL CALC: 87 mg/dL (ref <130)
TRIGLYCERIDES: 141 mg/dL (ref <150)
Total CHOL/HDL Ratio: 3.2 Ratio (ref <=5.0)
VLDL: 28 mg/dL (ref <30)

## 2015-07-27 LAB — HIV ANTIBODY (ROUTINE TESTING W REFLEX): HIV: NONREACTIVE

## 2015-07-27 LAB — TSH: TSH: 1.118 u[IU]/mL (ref 0.350–4.500)

## 2015-07-27 LAB — HEPATITIS C ANTIBODY: HCV AB: NEGATIVE

## 2015-07-30 ENCOUNTER — Encounter: Payer: Self-pay | Admitting: Physician Assistant

## 2015-08-21 ENCOUNTER — Encounter: Payer: Self-pay | Admitting: Gastroenterology

## 2015-09-24 ENCOUNTER — Other Ambulatory Visit: Payer: Self-pay | Admitting: Family Medicine

## 2015-09-26 ENCOUNTER — Ambulatory Visit (INDEPENDENT_AMBULATORY_CARE_PROVIDER_SITE_OTHER): Admitting: Urgent Care

## 2015-09-26 VITALS — BP 134/72 | HR 84 | Temp 98.1°F | Resp 18 | Ht 65.5 in | Wt 219.0 lb

## 2015-09-26 DIAGNOSIS — F17200 Nicotine dependence, unspecified, uncomplicated: Secondary | ICD-10-CM | POA: Diagnosis not present

## 2015-09-26 DIAGNOSIS — I10 Essential (primary) hypertension: Secondary | ICD-10-CM

## 2015-09-26 DIAGNOSIS — E119 Type 2 diabetes mellitus without complications: Secondary | ICD-10-CM | POA: Diagnosis not present

## 2015-09-26 DIAGNOSIS — E669 Obesity, unspecified: Secondary | ICD-10-CM | POA: Diagnosis not present

## 2015-09-26 DIAGNOSIS — Z111 Encounter for screening for respiratory tuberculosis: Secondary | ICD-10-CM | POA: Diagnosis not present

## 2015-09-26 MED ORDER — BLOOD GLUCOSE MONITOR KIT
PACK | Status: DC
Start: 2015-09-26 — End: 2022-05-16

## 2015-09-26 MED ORDER — HYDROCHLOROTHIAZIDE 12.5 MG PO CAPS: 12.5000 mg | ORAL_CAPSULE | Freq: Every day | ORAL | Status: DC

## 2015-09-26 NOTE — Progress Notes (Signed)
    MRN: [id number] DOB: 04/24/1956  Subjective:   Bailey [name] is a 60 y.o. female presenting for chief complaint of Follow-up; Medication Refill; and Immunizations  Glucometer - Patient is presenting for f/u on diabetes. States that she needs a refill of her glucometer. She is also unsure if her Metformin and glipizide were refilled. Patient had annual physical 07/2015. States that her diabetes is under control. Diet has been non-compliant. She does not exercise. Admits weight gain over the holidays. Denies polydipsia, polyuria, numbness or tingling, wounds over feet. Otherwise, ROS as below.  BP - would like a refill of her HCTZ. Denies lightheadedness, dizziness, chronic headache, double vision, chest pain, shortness of breath, heart racing, palpitations, nausea, vomiting, abdominal pain, hematuria, lower leg swelling. Patient is a smoker, 1ppd, she is considering quitting.  PPD - patient needs PPD for her work. 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  Bailey [name] has a current medication list which includes the following prescription(s): acetaminophen, atorvastatin, cyclobenzaprine, glimepiride, hydrochlorothiazide, lisinopril, metformin, pantoprazole, zoster vaccine live (pf), blood glucose meter kit and supplies, glucose blood, metoclopramide, and ondansetron. Also has No Known Allergies.  Bailey [name]  has a past medical history of Diabetes mellitus without complication ([place]); Hypertension; Arthritis; and Fibrocystic breast disease (July 2016). Also  has past surgical history that includes Cholecystectomy and Uterine fibroid surgery.  Objective:   Vitals: BP 134/72 mmHg  Pulse 84  Temp(Src) 98.1 F (36.7 C) (Oral)  Resp 18  Ht 5' 5.5" (1.664 m)  Wt 219 lb (99.338 kg)  BMI 35.88 kg/m2  SpO2 98%  Physical Exam  Constitutional: She is oriented to person, place, and time. She appears well-developed and well-nourished.  HENT:  Mouth/Throat: Oropharynx is clear and moist.  Eyes: Right eye exhibits no  discharge. Left eye exhibits no discharge. No scleral icterus.  Cardiovascular: Normal rate, regular rhythm and intact distal pulses.  Exam reveals no gallop and no friction rub.   No murmur heard. Pulmonary/Chest: No respiratory distress. She has no wheezes. She has no rales.  Musculoskeletal: She exhibits no edema.  Neurological: She is alert and oriented to person, place, and time.  Skin: Skin is warm and dry. No rash noted. No erythema. No pallor.  Psychiatric: She has a normal mood and affect.   Assessment and Plan :   1. Controlled type 2 diabetes mellitus without complication, without long-term current use of insulin ([place]) - Provided with script for glucometer. Counseled patient on diabetes and food, patient agreed to dietary modifications.  - Diabetes well controlled as per her last A1c, recheck in 3 months.  2. Essential hypertension - Controlled, refill provided for HCTZ, recommended avoiding salt in her diet. - Recheck 6 months  3. Obesity - Counseled on weight loss to better manage diagnoses as above.  4. Tobacco use disorder - Counseled on smoking cessation, information provided.  5. Screening for tuberculosis - TB Skin Test placed today, rtc in 48-72 hours   Bailey Eagles, PA-C [facility] [phone] 09/26/2015 8:54 AM

## 2015-09-26 NOTE — Progress Notes (Signed)
  Tuberculosis Risk Questionnaire  1. No Were you born outside the [place] in one of the following parts of the world: [place], [place], [place], [place] or [place]?    2. No Have you traveled outside the [place] and lived for more than one month in one of the following parts of the world: [place], [place], [place], [place] or [place]?    3. Yes  Do you have a compromised immune system such as from any of the following conditions:HIV/AIDS, organ or bone marrow transplantation, diabetes, immunosuppressive medicines (e.g. Prednisone, Remicaide), leukemia, lymphoma, cancer of the head or neck, gastrectomy or jejunal bypass, end-stage renal disease (on dialysis), or silicosis?     4. Yes  Have you ever or do you plan on working in: a residential care center, a health care facility, a jail or prison or homeless shelter?    5. No Have you ever: injected illegal drugs, used crack cocaine, lived in a homeless shelter  or been in jail or prison?     6. No Have you ever been exposed to anyone with infectious tuberculosis?    Tuberculosis Symptom Questionnaire  Do you currently have any of the following symptoms?  1. No Unexplained cough lasting more than 3 weeks?   2. No Unexplained fever lasting more than 3 weeks.   3. No Night Sweats (sweating that leaves the bedclothes and sheets wet)     4. No Shortness of Breath   5. No Chest Pain   6. No Unintentional weight loss    7. No Unexplained fatigue (very tired for no reason)   

## 2015-09-26 NOTE — Progress Notes (Signed)
   Subjective:    Patient ID: Bailey [name], female    DOB: 29-Jan-1956, 60 y.o.   MRN: [id number]  HPI    Review of Systems     Objective:   Physical Exam        Assessment & Plan:

## 2015-09-26 NOTE — Patient Instructions (Signed)
Smoking Cessation, Tips for Success If you are ready to quit smoking, congratulations! You have chosen to help yourself be healthier. Cigarettes bring nicotine, tar, carbon monoxide, and other irritants into your body. Your lungs, heart, and blood vessels will be able to work better without these poisons. There are many different ways to quit smoking. Nicotine gum, nicotine patches, a nicotine inhaler, or nicotine nasal spray can help with physical craving. Hypnosis, support groups, and medicines help break the habit of smoking. WHAT THINGS CAN I DO TO MAKE QUITTING EASIER?  Here are some tips to help you quit for good:  Pick a date when you will quit smoking completely. Tell all of your friends and family about your plan to quit on that date.  Do not try to slowly cut down on the number of cigarettes you are smoking. Pick a quit date and quit smoking completely starting on that day.  Throw away all cigarettes.   Clean and remove all ashtrays from your home, work, and car.  On a card, write down your reasons for quitting. Carry the card with you and read it when you get the urge to smoke.  Cleanse your body of nicotine. Drink enough water and fluids to keep your urine clear or pale yellow. Do this after quitting to flush the nicotine from your body.  Learn to predict your moods. Do not let a bad situation be your excuse to have a cigarette. Some situations in your life might tempt you into wanting a cigarette.  Never have "just one" cigarette. It leads to wanting another and another. Remind yourself of your decision to quit.  Change habits associated with smoking. If you smoked while driving or when feeling stressed, try other activities to replace smoking. Stand up when drinking your coffee. Brush your teeth after eating. Sit in a different chair when you read the paper. Avoid alcohol while trying to quit, and try to drink fewer caffeinated beverages. Alcohol and caffeine may urge you to  smoke.  Avoid foods and drinks that can trigger a desire to smoke, such as sugary or spicy foods and alcohol.  Ask people who smoke not to smoke around you.  Have something planned to do right after eating or having a cup of coffee. For example, plan to take a walk or exercise.  Try a relaxation exercise to calm you down and decrease your stress. Remember, you may be tense and nervous for the first 2 weeks after you quit, but this will pass.  Find new activities to keep your hands busy. Play with a pen, coin, or rubber band. Doodle or draw things on paper.  Brush your teeth right after eating. This will help cut down on the craving for the taste of tobacco after meals. You can also try mouthwash.   Use oral substitutes in place of cigarettes. Try using lemon drops, carrots, cinnamon sticks, or chewing gum. Keep them handy so they are available when you have the urge to smoke.  When you have the urge to smoke, try deep breathing.  Designate your home as a nonsmoking area.  If you are a heavy smoker, ask your health care provider about a prescription for nicotine chewing gum. It can ease your withdrawal from nicotine.  Reward yourself. Set aside the cigarette money you save and buy yourself something nice.  Look for support from others. Join a support group or smoking cessation program. Ask someone at home or at work to help you with your plan   to quit smoking.  Always ask yourself, "Do I need this cigarette or is this just a reflex?" Tell yourself, "Today, I choose not to smoke," or "I do not want to smoke." You are reminding yourself of your decision to quit.  Do not replace cigarette smoking with electronic cigarettes (commonly called e-cigarettes). The safety of e-cigarettes is unknown, and some may contain harmful chemicals.  If you relapse, do not give up! Plan ahead and think about what you will do the next time you get the urge to smoke. HOW WILL I FEEL WHEN I QUIT SMOKING? You  may have symptoms of withdrawal because your body is used to nicotine (the addictive substance in cigarettes). You may crave cigarettes, be irritable, feel very hungry, cough often, get headaches, or have difficulty concentrating. The withdrawal symptoms are only temporary. They are strongest when you first quit but will go away within 10-14 days. When withdrawal symptoms occur, stay in control. Think about your reasons for quitting. Remind yourself that these are signs that your body is healing and getting used to being without cigarettes. Remember that withdrawal symptoms are easier to treat than the major diseases that smoking can cause.  Even after the withdrawal is over, expect periodic urges to smoke. However, these cravings are generally short lived and will go away whether you smoke or not. Do not smoke! WHAT RESOURCES ARE AVAILABLE TO HELP ME QUIT SMOKING? Your health care provider can direct you to community resources or hospitals for support, which may include:  Group support.  Education.  Hypnosis.  Therapy.   This information is not intended to replace advice given to you by your health care provider. Make sure you discuss any questions you have with your health care provider.   Document Released: 06/05/2004 Document Revised: 09/28/2014 Document Reviewed: 02/23/2013 Elsevier Interactive Patient Education 2016 [employer].    Varenicline oral tablets What is this medicine? VARENICLINE (var EN i kleen) is used to help people quit smoking. It can reduce the symptoms caused by stopping smoking. It is used with a patient support program recommended by your physician. This medicine may be used for other purposes; ask your health care provider or pharmacist if you have questions. What should I tell my health care provider before I take this medicine? They need to know if you have any of these conditions: -bipolar disorder, depression, schizophrenia or other mental illness -heart  disease -if you often drink alcohol -kidney disease -peripheral vascular disease -seizures -stroke -suicidal thoughts, plans, or attempt; a previous suicide attempt by you or a family member -an unusual or allergic reaction to varenicline, other medicines, foods, dyes, or preservatives -pregnant or trying to get pregnant -breast-feeding How should I use this medicine? Take this medicine by mouth after eating. Take with a full glass of water. Follow the directions on the prescription label. Take your doses at regular intervals. Do not take your medicine more often than directed. There are 3 ways you can use this medicine to help you quit smoking; talk to your health care professional to decide which plan is right for you: 1) you can choose a quit date and start this medicine 1 week before the quit date, or, 2) you can start taking this medicine before you choose a quit date, and then pick a quit date between day 8 and 35 days of treatment, or, 3) if you are not sure that you are able or willing to quit smoking right away, start taking this medicine and  slowly decrease the amount you smoke as directed by your health care professional with the goal of being cigarette-free by week 12 of treatment. Stick to your plan; ask about support groups or other ways to help you remain cigarette-free. If you are motivated to quit smoking and did not succeed during a previous attempt with this medicine for reasons other than side effects, or if you returned to smoking after this treatment, speak with your health care professional about whether another course of this medicine may be right for you. A special MedGuide will be given to you by the pharmacist with each prescription and refill. Be sure to read this information carefully each time. Talk to your pediatrician regarding the use of this medicine in children. This medicine is not approved for use in children. Overdosage: If you think you have taken too much of  this medicine contact a poison control center or emergency room at once. NOTE: This medicine is only for you. Do not share this medicine with others. What if I miss a dose? If you miss a dose, take it as soon as you can. If it is almost time for your next dose, take only that dose. Do not take double or extra doses. What may interact with this medicine? -alcohol or any product that contains alcohol -insulin -other stop smoking aids -theophylline -warfarin This list may not describe all possible interactions. Give your health care provider a list of all the medicines, herbs, non-prescription drugs, or dietary supplements you use. Also tell them if you smoke, drink alcohol, or use illegal drugs. Some items may interact with your medicine. What should I watch for while using this medicine? Visit your doctor or health care professional for regular check ups. Ask for ongoing advice and encouragement from your doctor or healthcare professional, friends, and family to help you quit. If you smoke while on this medication, quit again Your mouth may get dry. Chewing sugarless gum or sucking hard candy, and drinking plenty of water may help. Contact your doctor if the problem does not go away or is severe. You may get drowsy or dizzy. Do not drive, use machinery, or do anything that needs mental alertness until you know how this medicine affects you. Do not stand or sit up quickly, especially if you are an older patient. This reduces the risk of dizzy or fainting spells. Sleepwalking can happen during treatment with this medicine, and can sometimes lead to behavior that is harmful to you, other people, or property. Stop taking this medicine and tell your doctor if you start sleepwalking or have other unusual sleep-related activity. Decrease the amount of alcoholic beverages that you drink during treatment with this medicine until you know if this medicine affects your ability to tolerate alcohol. Some people  have experienced increased drunkenness (intoxication), unusual or sometimes aggressive behavior, or no memory of things that have happened (amnesia) during treatment with this medicine. The use of this medicine may increase the chance of suicidal thoughts or actions. Pay special attention to how you are responding while on this medicine. Any worsening of mood, or thoughts of suicide or dying should be reported to your health care professional right away. What side effects may I notice from receiving this medicine? Side effects that you should report to your doctor or health care professional as soon as possible: -allergic reactions like skin rash, itching or hives, swelling of the face, lips, tongue, or throat -acting aggressive, being angry or violent, or acting on dangerous  impulses -breathing problems -changes in vision -chest pain or chest tightness -confusion, trouble speaking or understanding -new or worsening depression, anxiety, or panic attacks -extreme increase in activity and talking (mania) -fast, irregular heartbeat -feeling faint or lightheaded, falls -fever -pain in legs when walking -problems with balance, talking, walking -redness, blistering, peeling or loosening of the skin, including inside the mouth -ringing in ears -seeing or hearing things that aren't there (hallucinations) -seizures -sleepwalking -sudden numbness or weakness of the face, arm or leg -thoughts about suicide or dying, or attempts to commit suicide -trouble passing urine or change in the amount of urine -unusual bleeding or bruising -unusually weak or tired Side effects that usually do not require medical attention (report to your doctor or health care professional if they continue or are bothersome): -constipation -headache -nausea, vomiting -strange dreams -stomach gas -trouble sleeping This list may not describe all possible side effects. Call your doctor for medical advice about side effects.  You may report side effects to FDA at 1-800-FDA-1088. Where should I keep my medicine? Keep out of the reach of children. Store at room temperature between 15 and 30 degrees C (59 and 86 degrees F). Throw away any unused medicine after the expiration date. NOTE: This sheet is a summary. It may not cover all possible information. If you have questions about this medicine, talk to your doctor, pharmacist, or health care provider.    2016, Elsevier/Gold Standard. (2015-05-23 16:14:23)    Bupropion sustained-release tablets (smoking cessation) What is this medicine? BUPROPION (byoo PROE pee on) is used to help people quit smoking. This medicine may be used for other purposes; ask your health care provider or pharmacist if you have questions. What should I tell my health care provider before I take this medicine? They need to know if you have any of these conditions: -an eating disorder, such as anorexia or bulimia -bipolar disorder or psychosis -diabetes or high blood sugar, treated with medication -glaucoma -head injury or brain tumor -heart disease, previous heart attack, or irregular heart beat -high blood pressure -kidney or liver disease -seizures -suicidal thoughts or a previous suicide attempt -Tourette's syndrome -weight loss -an unusual or allergic reaction to bupropion, other medicines, foods, dyes, or preservatives -breast-feeding -pregnant or trying to become pregnant How should I use this medicine? Take this medicine by mouth with a glass of water. Follow the directions on the prescription label. You can take it with or without food. If it upsets your stomach, take it with food. Do not cut, crush or chew this medicine. Take your medicine at regular intervals. If you take this medicine more than once a day, take your second dose at least 8 hours after you take your first dose. To limit difficulty in sleeping, avoid taking this medicine at bedtime. Do not take your medicine  more often than directed. Do not stop taking this medicine suddenly except upon the advice of your doctor. Stopping this medicine too quickly may cause serious side effects. A special MedGuide will be given to you by the pharmacist with each prescription and refill. Be sure to read this information carefully each time. Talk to your pediatrician regarding the use of this medicine in children. Special care may be needed. Overdosage: If you think you have taken too much of this medicine contact a poison control center or emergency room at once. NOTE: This medicine is only for you. Do not share this medicine with others. What if I miss a dose? If you miss a  dose, skip the missed dose and take your next tablet at the regular time. There should be at least 8 hours between doses. Do not take double or extra doses. What may interact with this medicine? Do not take this medicine with any of the following medications: -linezolid -MAOIs like Azilect, Carbex, Eldepryl, Marplan, Nardil, and Parnate -methylene blue (injected into a vein) -other medicines that contain bupropion like Wellbutrin This medicine may also interact with the following medications: -alcohol -certain medicines for anxiety or sleep -certain medicines for blood pressure like metoprolol, propranolol -certain medicines for depression or psychotic disturbances -certain medicines for HIV or AIDS like efavirenz, lopinavir, nelfinavir, ritonavir -certain medicines for irregular heart beat like propafenone, flecainide -certain medicines for Parkinson's disease like amantadine, levodopa -certain medicines for seizures like carbamazepine, phenytoin, phenobarbital -cimetidine -clopidogrel -cyclophosphamide -furazolidone -isoniazid -nicotine -orphenadrine -procarbazine -steroid medicines like prednisone or cortisone -stimulant medicines for attention disorders, weight loss, or to stay  awake -tamoxifen -theophylline -thiotepa -ticlopidine -tramadol -warfarin This list may not describe all possible interactions. Give your health care provider a list of all the medicines, herbs, non-prescription drugs, or dietary supplements you use. Also tell them if you smoke, drink alcohol, or use illegal drugs. Some items may interact with your medicine. What should I watch for while using this medicine? Visit your doctor or health care professional for regular checks on your progress. This medicine should be used together with a patient support program. It is important to participate in a behavioral program, counseling, or other support program that is recommended by your health care professional. Patients and their families should watch out for new or worsening thoughts of suicide or depression. Also watch out for sudden changes in feelings such as feeling anxious, agitated, panicky, irritable, hostile, aggressive, impulsive, severely restless, overly excited and hyperactive, or not being able to sleep. If this happens, especially at the beginning of treatment or after a change in dose, call your health care professional. Avoid alcoholic drinks while taking this medicine. Drinking excessive alcoholic beverages, using sleeping or anxiety medicines, or quickly stopping the use of these agents while taking this medicine may increase your risk for a seizure. Do not drive or use heavy machinery until you know how this medicine affects you. This medicine can impair your ability to perform these tasks. Do not take this medicine close to bedtime. It may prevent you from sleeping. Your mouth may get dry. Chewing sugarless gum or sucking hard candy, and drinking plenty of water may help. Contact your doctor if the problem does not go away or is severe. Do not use nicotine patches or chewing gum without the advice of your doctor or health care professional while taking this medicine. You may need to have  your blood pressure taken regularly if your doctor recommends that you use both nicotine and this medicine together. What side effects may I notice from receiving this medicine? Side effects that you should report to your doctor or health care professional as soon as possible: -allergic reactions like skin rash, itching or hives, swelling of the face, lips, or tongue -breathing problems -changes in vision -confusion -fast or irregular heartbeat -hallucinations -increased blood pressure -redness, blistering, peeling or loosening of the skin, including inside the mouth -seizures -suicidal thoughts or other mood changes -unusually weak or tired -vomiting Side effects that usually do not require medical attention (report to your doctor or health care professional if they continue or are bothersome): -change in sex drive or performance -constipation -headache -loss  of appetite -nausea -tremors -weight loss This list may not describe all possible side effects. Call your doctor for medical advice about side effects. You may report side effects to FDA at 1-800-FDA-1088. Where should I keep my medicine? Keep out of the reach of children. Store at room temperature between 20 and 25 degrees C (68 and 77 degrees F). Protect from light. Keep container tightly closed. Throw away any unused medicine after the expiration date. NOTE: This sheet is a summary. It may not cover all possible information. If you have questions about this medicine, talk to your doctor, pharmacist, or health care provider.    2016, Elsevier/Gold Standard. (2013-05-05 10:55:10)

## 2015-09-27 ENCOUNTER — Telehealth: Payer: Self-pay

## 2015-09-27 ENCOUNTER — Other Ambulatory Visit: Payer: Self-pay | Admitting: Family Medicine

## 2015-09-27 MED ORDER — METFORMIN HCL ER 500 MG PO TB24
1000.0000 mg | ORAL_TABLET | Freq: Two times a day (BID) | ORAL | Status: DC
Start: 2015-09-27 — End: 2016-07-07

## 2015-09-27 NOTE — Telephone Encounter (Signed)
Chelle sent in RFs of most of pt's meds on 11/4 with RFs for a year. I called pharm to make sure that they had all of these and they do have a record of all of them and will get pantoprazole and metformin ready. However, metformin ER was written for the 1000 mg tabs which are not generic and not covered by ins. Changed to the metformin ER 500 mg tabs, 2 tablets BID per ins coverage and much less cost for pt. They will make sure pt knows to take 2 tabs BID.   Pt will call [facility] to find out what brand of meter and testing strips they cover, and will let me know so that I can send the Rxs to pharm.

## 2015-09-27 NOTE — Telephone Encounter (Signed)
Pt was seen here on 09/26/15 by Bailey [name] for Controlled type 2 diabetes mellitus without complication, without long-term current use of insulin ([place]). She states she does not have a script for her blood glucose meter kit and supplies KIT [[id number]], even though it states it was printed. She would like a refill on her pantoprazole (PROTONIX) 40 MG tablet [[id number]]. Pharmacy:  [facility] [address] [address] I let her know we are working on the metFORMIN (GLUCOPHAGE) 500 MG tablet. CB # [phone]

## 2015-10-11 ENCOUNTER — Encounter: Payer: Self-pay | Admitting: Gastroenterology

## 2015-10-11 ENCOUNTER — Encounter: Payer: Self-pay | Admitting: Family Medicine

## 2015-10-11 ENCOUNTER — Ambulatory Visit (INDEPENDENT_AMBULATORY_CARE_PROVIDER_SITE_OTHER): Admitting: Gastroenterology

## 2015-10-11 VITALS — BP 138/70 | HR 82 | Ht 65.5 in | Wt 216.8 lb

## 2015-10-11 DIAGNOSIS — R197 Diarrhea, unspecified: Secondary | ICD-10-CM

## 2015-10-11 DIAGNOSIS — Z1211 Encounter for screening for malignant neoplasm of colon: Secondary | ICD-10-CM | POA: Diagnosis not present

## 2015-10-11 DIAGNOSIS — K219 Gastro-esophageal reflux disease without esophagitis: Secondary | ICD-10-CM

## 2015-10-11 DIAGNOSIS — E669 Obesity, unspecified: Secondary | ICD-10-CM

## 2015-10-11 MED ORDER — NA SULFATE-K SULFATE-MG SULF 17.5-3.13-1.6 GM/177ML PO SOLN: 1.0000 | Freq: Once | ORAL | Status: DC

## 2015-10-11 MED ORDER — CHOLESTYRAMINE 4 G PO PACK: 4.0000 g | PACK | Freq: Two times a day (BID) | ORAL | Status: DC

## 2015-10-11 NOTE — Patient Instructions (Signed)
You have been scheduled for a colonoscopy. Please follow written instructions given to you at your visit today.  Please pick up your prep supplies at the pharmacy within the next 1-3 days. If you use inhalers (even only as needed), please bring them with you on the day of your procedure. Your physician has requested that you go to [email or web address] and enter the access code given to you at your visit today. This web site gives a general overview about your procedure. However, you should still follow specific instructions given to you by our office regarding your preparation for the procedure.  Follow up in 3 months

## 2015-10-11 NOTE — Progress Notes (Signed)
Bailey [name]    [id number]    03-Jul-1956  Primary Care Physician:COPLAND,JESSICA, MD  Referring Physician: Harrison Mons, PA-C [place], [place] [address]  Chief complaint:  Diarrhea  HPI: 60 year old female with history of obesity, diabetes, status post cholecystectomy here with complaints of chronic diarrhea. Patient reports she is been having increased stool frequency since her gallbladder surgery many years ago but it got worse in the past 2-3 years after she was started on metformin. She is also lactose intolerant and currently he avoids drinking milk but does not avoid other dairy products. She drinks multiple cups of coffee throughout the day and 1 or 2 soda per day. Denies any blood in stool or melena. She was also having heartburn and dyspepsia symptoms recently and was started on PPI by her PMD, which has improved her heartburn significantly. She never had screening colonoscopy. No family history of colon cancer.   Outpatient Encounter Prescriptions as of 10/11/2015  Medication Sig  . acetaminophen (TYLENOL) 500 MG tablet Take 1,000 mg by mouth every 6 (six) hours as needed (pain). Reported on 09/26/2015  . atorvastatin (LIPITOR) 40 MG tablet Take 1 tablet (40 mg total) by mouth daily.  . blood glucose meter kit and supplies KIT Dispense based on patient and insurance preference. Use up to four times daily as directed. (FOR ICD-9 250.00, 250.01).  . cyclobenzaprine (FLEXERIL) 5 MG tablet Take 1 tablet (5 mg total) by mouth at bedtime.  Marland Kitchen glimepiride (AMARYL) 4 MG tablet Take 2 tablets (8 mg total) by mouth daily with breakfast.  . glucose blood test strip Test blood sugar daily. Dx code: E11.8.  Marland Kitchen hydrochlorothiazide (MICROZIDE) 12.5 MG capsule Take 1 capsule (12.5 mg total) by mouth daily.  Marland Kitchen lisinopril (PRINIVIL,ZESTRIL) 20 MG tablet Take 1 tablet (20 mg total) by mouth daily.  . metFORMIN (GLUCOPHAGE-XR) 500 MG 24 hr tablet Take 2 tablets (1,000 mg  total) by mouth 2 (two) times daily with a meal.  . pantoprazole (PROTONIX) 40 MG tablet Take 1 tablet (40 mg total) by mouth daily.  . cholestyramine (QUESTRAN) 4 g packet Take 1 packet (4 g total) by mouth 2 (two) times daily.  . Na Sulfate-K Sulfate-Mg Sulf (SUPREP BOWEL PREP) SOLN Take 1 kit by mouth once.  . [DISCONTINUED] metoCLOPramide (REGLAN) 10 MG tablet Take 10 mg by mouth every 6 (six) hours as needed for nausea. Reported on 09/26/2015  . [DISCONTINUED] ondansetron (ZOFRAN-ODT) 8 MG disintegrating tablet Take 1 tablet (8 mg total) by mouth every 8 (eight) hours as needed for nausea. (Patient not taking: Reported on 07/26/2015)  . [DISCONTINUED] zoster vaccine live, PF, (ZOSTAVAX) 26378 UNT/0.65ML injection Inject 19,400 Units into the skin once.   No facility-administered encounter medications on file as of 10/11/2015.    Allergies as of 10/11/2015  . (No Known Allergies)    Past Medical History  Diagnosis Date  . Diabetes mellitus without complication ([place])   . Hypertension   . Arthritis   . Fibrocystic breast disease July 2016    Past Surgical History  Procedure Laterality Date  . Cholecystectomy    . Uterine fibroid surgery      Family History  Problem Relation Age of Onset  . Diabetes Mother   . Stroke Mother   . Dementia Mother   . Heart disease Father   . Diabetes Father   . Diabetes Brother   . Dementia Brother   . Renal Disease  Brother   . Diabetes Sister   . Narcolepsy Sister   . Arthritis Sister   . Diabetes Sister   . Obesity Sister   . Diabetes Brother   . Heart disease Brother   . Cancer Brother     pancreatic  . Diabetes Brother   . Diabetes Brother     Social History   Social History  . Marital Status: Widowed    Spouse Name: N/A  . Number of Children: 2  . Years of Education: N/A   Occupational History  . Habilitation tech    Social History Main Topics  . Smoking status: Current Every Day Smoker -- 1.00 packs/day for 20 years     Types: Cigarettes  . Smokeless tobacco: Never Used     Comment: Tobacco info given 10/11/15  . Alcohol Use: No  . Drug Use: No  . Sexual Activity: No   Other Topics Concern  . Not on file   Social History Narrative      Review of systems: Review of Systems  Constitutional: Negative for fever and chills.  HENT: Negative.   Eyes: Negative for blurred vision.  Respiratory: Negative for cough, shortness of breath and wheezing.   Cardiovascular: Negative for chest pain and palpitations.  Gastrointestinal: as per HPI Genitourinary: Negative for dysuria, urgency, frequency and hematuria.  Musculoskeletal: Negative for myalgias, back pain and joint pain.  Skin: Negative for itching and rash.  Neurological: Negative for dizziness, tremors, focal weakness, seizures and loss of consciousness.  Endo/Heme/Allergies: Negative for environmental allergies.  Psychiatric/Behavioral: Negative for depression, suicidal ideas and hallucinations.  All other systems reviewed and are negative.   Physical Exam: Filed Vitals:   10/11/15 1418  BP: 138/70  Pulse: 82   Gen:      No acute distress HEENT:  EOMI, sclera anicteric Neck:     No masses; no thyromegaly Lungs:    Clear to auscultation bilaterally; normal respiratory effort CV:         Regular rate and rhythm; no murmurs Abd:      + bowel sounds; soft, non-tender; no palpable masses, no distension Ext:    No edema; adequate peripheral perfusion Skin:      Warm and dry; no rash Neuro: alert and oriented x 3 Psych: normal mood and affect  Data Reviewed:  Reviewed her chart in epic   Assessment and Plan/Recommendations: 59-year-old female with history of obesity, diabetes, GERD here for new patient visit with complaints of chronic diarrhea Diarrhea likely multifactorial: Status post cholecystectomy, diet related and also possible medications. Advised patient to avoid lactose, high fructose and limit the use of caffeinated  averages We'll do an empiric trial of cholestyramine Schedule for colonoscopy for screening for colorectal cancer and also to rule out microscopic colitis GERD: Significant improvement of symptoms with PPI Discussed antireflux measures and weight loss Return in 3-6 months  K. Veena Nandigam , MD [phone] Mon-Fri 8a-5p [phone] after 5p, weekends, holidays  

## 2015-10-16 ENCOUNTER — Encounter: Payer: Self-pay | Admitting: Family Medicine

## 2015-10-28 ENCOUNTER — Encounter: Payer: Self-pay | Admitting: Family Medicine

## 2015-10-28 ENCOUNTER — Ambulatory Visit (INDEPENDENT_AMBULATORY_CARE_PROVIDER_SITE_OTHER)

## 2015-10-28 ENCOUNTER — Ambulatory Visit (INDEPENDENT_AMBULATORY_CARE_PROVIDER_SITE_OTHER): Admitting: Family Medicine

## 2015-10-28 VITALS — BP 122/80 | HR 99 | Temp 98.2°F | Resp 18 | Wt 215.6 lb

## 2015-10-28 DIAGNOSIS — M546 Pain in thoracic spine: Secondary | ICD-10-CM

## 2015-10-28 DIAGNOSIS — E119 Type 2 diabetes mellitus without complications: Secondary | ICD-10-CM

## 2015-10-28 DIAGNOSIS — M25561 Pain in right knee: Secondary | ICD-10-CM | POA: Diagnosis not present

## 2015-10-28 DIAGNOSIS — I10 Essential (primary) hypertension: Secondary | ICD-10-CM

## 2015-10-28 LAB — POCT GLYCOSYLATED HEMOGLOBIN (HGB A1C): Hemoglobin A1C: 7.6

## 2015-10-28 MED ORDER — MELOXICAM 15 MG PO TABS: 15.0000 mg | ORAL_TABLET | Freq: Every day | ORAL | Status: DC

## 2015-10-28 MED ORDER — LISINOPRIL 20 MG PO TABS: 20.0000 mg | ORAL_TABLET | Freq: Every day | ORAL | Status: DC

## 2015-10-28 NOTE — Progress Notes (Signed)
[facility] [address], [place] [address] [phone]  Date:  10/28/2015   Name:  Bailey [name]   DOB:  12/17/1955   MRN:  [id number]  PCP:  COPLAND,JESSICA, MD    Chief Complaint: Back Pain and Knee Pain   History of Present Illness:  Bailey [name] is a 59 y.o. very pleasant female patient who presents with the following:  I last saw this pt in December 2015. History of DM, GERD, HTN.  Her DM has been well controlled as of late Her health maint is UTD except for colonoscopy and eye exam:  Colon is scheduled for 11/07/2015 She does have an eye doctor- she plans to have her annual exam soon  Lab Results  Component Value Date   HGBA1C 6.4 07/26/2015   She is here today with back pain and right knee pain The right knee has bothered her for about 3 weeks.  It will wax and wane.  No particular injury.    The pain is right across the front of the knee.  She has pain with climbing stairs and getting up from sitting She is taking flexeril that is helping with her pain but causes her to feel sleepy.  No instability, no falls. She does feel like she has to be more careful on her knee but it is not actually weak.   No clicking or popping or getting stuck  Her back hurts in the right midback.  It hurts with walking, with taking a deep breath.  It has bothered her for 3 days or so.  The flexeril is helping this as well.  She plans to go back to work tomorrow and cannot use flexeril while she is there.  She works at a group home No prior history of back trouble.  No pain down in her legs.    No numbness or weakness or her legs, no change in her baseline fecal urgency  Patient Active Problem List   Diagnosis Date Noted  . GERD (gastroesophageal reflux disease) 07/26/2015  . Hyperlipidemia 07/26/2015  . Dyspepsia 07/26/2015  . Essential hypertension 07/26/2015  . Fibrocystic breast disease 03/22/2015  . Type II or unspecified type diabetes mellitus with  unspecified complication, uncontrolled 02/16/2013    Past Medical History  Diagnosis Date  . Diabetes mellitus without complication (HCC)   . Hypertension   . Arthritis   . Fibrocystic breast disease July 2016    Past Surgical History  Procedure Laterality Date  . Cholecystectomy    . Uterine fibroid surgery      Social History  Substance Use Topics  . Smoking status: Current Every Day Smoker -- 1.00 packs/day for 20 years    Types: Cigarettes  . Smokeless tobacco: Never Used     Comment: Tobacco info given 10/11/15  . Alcohol Use: No    Family History  Problem Relation Age of Onset  . Diabetes Mother   . Stroke Mother   . Dementia Mother   . Heart disease Father   . Diabetes Father   . Diabetes Brother   . Dementia Brother   . Renal Disease Brother   . Diabetes Sister   . Narcolepsy Sister   . Arthritis Sister   . Diabetes Sister   . Obesity Sister   . Diabetes Brother   . Heart disease Brother   . Cancer Brother     pancreatic  . Diabetes Brother   . Diabetes Brother       No Known Allergies  Medication list has been reviewed and updated.  Current Outpatient Prescriptions on File Prior to Visit  Medication Sig Dispense Refill  . acetaminophen (TYLENOL) 500 MG tablet Take 1,000 mg by mouth every 6 (six) hours as needed (pain). Reported on 09/26/2015    . atorvastatin (LIPITOR) 40 MG tablet Take 1 tablet (40 mg total) by mouth daily. 90 tablet 3  . blood glucose meter kit and supplies KIT Dispense based on patient and insurance preference. Use up to four times daily as directed. (FOR ICD-9 250.00, 250.01). 1 each 0  . cholestyramine (QUESTRAN) 4 g packet Take 1 packet (4 g total) by mouth 2 (two) times daily. 60 each 3  . cyclobenzaprine (FLEXERIL) 5 MG tablet Take 1 tablet (5 mg total) by mouth at bedtime. 30 tablet 5  . glimepiride (AMARYL) 4 MG tablet Take 2 tablets (8 mg total) by mouth daily with breakfast. 180 tablet 3  . glucose blood test strip Test  blood sugar daily. Dx code: E11.8. 100 each 3  . hydrochlorothiazide (MICROZIDE) 12.5 MG capsule Take 1 capsule (12.5 mg total) by mouth daily. 90 capsule 3  . lisinopril (PRINIVIL,ZESTRIL) 20 MG tablet Take 1 tablet (20 mg total) by mouth daily. 90 tablet 3  . metFORMIN (GLUCOPHAGE-XR) 500 MG 24 hr tablet Take 2 tablets (1,000 mg total) by mouth 2 (two) times daily with a meal. 360 tablet 2  . pantoprazole (PROTONIX) 40 MG tablet Take 1 tablet (40 mg total) by mouth daily. 90 tablet 3  . Na Sulfate-K Sulfate-Mg Sulf (SUPREP BOWEL PREP) SOLN Take 1 kit by mouth once. (Patient not taking: Reported on 10/28/2015) 1 Bottle 0   No current facility-administered medications on file prior to visit.    Review of Systems:  As per HPI- otherwise negative.   Physical Examination: Filed Vitals:   10/28/15 0850  BP: 122/80  Pulse: 99  Temp: 98.2 F (36.8 C)  Resp: 18   Filed Vitals:   10/28/15 0850  Weight: 215 lb 9.6 oz (97.796 kg)   Body mass index is 35.32 kg/(m^2). Ideal Body Weight:    GEN: WDWN, NAD, Non-toxic, A & O x 3, obese, looks well HEENT: Atraumatic, Normocephalic. Neck supple. No masses, No LAD. Ears and Nose: No external deformity. CV: RRR, No M/G/R. No JVD. No thrill. No extra heart sounds. PULM: CTA B, no wheezes, crackles, rhonchi. No retractions. No resp. distress. No accessory muscle use. EXTR: No c/c/e NEURO Normal gait.  PSYCH: Normally interactive. Conversant. Not depressed or anxious appearing.  Calm demeanor.  Right knee:   Mild crepitus with ROM, no swelling, heat or effusion  Right mid back:  She is tender in the mid thoracic muscles  Normal lumbar flexion and extension Normal BLE strength, sensation and DTR.  Negative SLR bilaterally   [facility] reading (PRIMARY) by  Dr. Lorelei Pont Right knee: mild degenerative change T spine: mild degenerative change  Dg Thoracic Spine 2 View  10/28/2015  CLINICAL DATA:  Back pain. EXAM: THORACIC SPINE 2 VIEWS COMPARISON:   07/22/2013.  05/11/2013 . FINDINGS: Degenerative changes thoracic spine. No acute bony abnormality identified. Normal alignment. Carotid atherosclerotic vascular disease. IMPRESSION: 1.  Diffuse degenerative change thoracic spine. 2. Carotid atherosclerotic vascular disease. Electronically Signed   By: Marcello Moores  Register   On: 10/28/2015 09:39   Dg Knee Complete 4 Views Right  10/28/2015  CLINICAL DATA:  Anterior right knee pain, chronic. No known injury. Initial encounter. EXAM: RIGHT KNEE - COMPLETE 4+  VIEW COMPARISON:  None. FINDINGS: There is no evidence of fracture, dislocation, or joint effusion. Small osteophytes are present about the knee most notable in the patellofemoral compartment. Soft tissues are unremarkable. IMPRESSION: No acute abnormality. Mild degenerative change most notable in the patellofemoral compartment. Electronically Signed   By: Thomas  Dalessio M.D.   On: 10/28/2015 09:37     Results for orders placed or performed in visit on 10/28/15  POCT glycosylated hemoglobin (Hb A1C)  Result Value Ref Range   Hemoglobin A1C 7.6    Wt Readings from Last 3 Encounters:  10/28/15 215 lb 9.6 oz (97.796 kg)  10/11/15 216 lb 12.8 oz (98.34 kg)  09/26/15 219 lb (99.338 kg)   Weight was 211 in November. She admits she was "being terrible" with her diet over the holidays and plans to improve  Assessment and Plan: Pain in right knee - Plan: DG Knee Complete 4 Views Right, meloxicam (MOBIC) 15 MG tablet  Right-sided thoracic back pain - Plan: DG Thoracic Spine 2 View, meloxicam (MOBIC) 15 MG tablet  Controlled type 2 diabetes mellitus without complication, without long-term current use of insulin (HCC) - Plan: POCT glycosylated hemoglobin (Hb A1C)  Essential hypertension - Plan: lisinopril (PRINIVIL,ZESTRIL) 20 MG tablet  A1c has gone up- she is going to work on this Fitted with a hinged knee brace for 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   

## 2015-10-28 NOTE — Patient Instructions (Addendum)
Because you received an x-ray today, you will receive an invoice from [facility]. Please contact [facility] at [phone] with questions or concerns regarding your invoice. Our billing staff will not be able to assist you with those questions.  Please have your eye exam and colonoscopy results sent to me Your A1c has gone up a bit- work on your diet, and we can recheck in 3-4 months We will not change your medications for now  Try the knee brace as needed for your knee pain.  I think it is due to arthritis. Your back pain is due to a muscle strain Use the mobic as needed- you can use a 1/2 pill if that is enough You can continue to use the flexeril as needed but just use this at night  Let me know if your knee and/ or back are not better in a week or so  It would be my pleasure to continue to see you as a patient at my new office, starting the last week of February.  If you prefer to remain at [facility] one of my partners will be happy to see you here.   [facility] [address] [place], [place] [address] Phone: [phone]

## 2015-10-29 ENCOUNTER — Encounter: Payer: Self-pay | Admitting: Family Medicine

## 2015-11-07 ENCOUNTER — Encounter: Payer: Self-pay | Admitting: Gastroenterology

## 2015-11-07 ENCOUNTER — Ambulatory Visit (AMBULATORY_SURGERY_CENTER): Admitting: Gastroenterology

## 2015-11-07 VITALS — BP 135/64 | HR 91 | Temp 95.8°F | Resp 22 | Ht 65.0 in | Wt 216.0 lb

## 2015-11-07 DIAGNOSIS — R197 Diarrhea, unspecified: Secondary | ICD-10-CM | POA: Diagnosis not present

## 2015-11-07 DIAGNOSIS — Z1211 Encounter for screening for malignant neoplasm of colon: Secondary | ICD-10-CM | POA: Diagnosis present

## 2015-11-07 LAB — GLUCOSE, CAPILLARY: GLUCOSE-CAPILLARY: 154 mg/dL — AB (ref 65–99)

## 2015-11-07 MED ORDER — SODIUM CHLORIDE 0.9 % IV SOLN: 500.0000 mL | INTRAVENOUS | Status: DC

## 2015-11-07 NOTE — Op Note (Signed)
[facility]  [address] [place], [address]   COLONOSCOPY PROCEDURE REPORT  PATIENT: Bailey, [name]  MR#: [id number] BIRTHDATE: [birth year] , 59  yrs. old GENDER: female ENDOSCOPIST: Harl Bowie, MD REFERRED [id number] Copland, M.D. PROCEDURE DATE:  11/07/2015 PROCEDURE:   Colonoscopy, screening and Colonoscopy with biopsy First Screening Colonoscopy - Avg.  risk and is 50 yrs.  old or older Yes.  Prior Negative Screening - Now for repeat screening. N/A  History of Adenoma - Now for follow-up colonoscopy & has been > or = to 3 yrs.  N/A  Polyps removed today? No Recommend repeat exam, <10 yrs? No ASA CLASS:   Class II INDICATIONS:Screening for colonic neoplasia and Colorectal Neoplasm Risk Assessment for this procedure is average risk. MEDICATIONS: Propofol 200 mg IV  DESCRIPTION OF PROCEDURE:   After the risks benefits and alternatives of the procedure were thoroughly explained, informed consent was obtained.  The digital rectal exam revealed internal hemorrhoids.   The LB [id number]  endoscope was introduced through the anus and advanced to the terminal ileum which was intubated for a short distance. No adverse events experienced. The quality of the prep was good.  The instrument was then slowly withdrawn as the colon was fully examined. Estimated blood loss is zero unless otherwise noted in this procedure report.   COLON FINDINGS: The examined terminal ileum appeared to be normal. A normal appearing cecum, ileocecal valve, and appendiceal orifice were identified.  The ascending, transverse, descending, sigmoid colon, and rectum appeared unremarkable.  Multiple biopsies were performed using cold forceps.   There was mild diverticulosis noted in the ascending colon.  Retroflexed views revealed internal hemorrhoids. The time to cecum = 3.3 Withdrawal time = 6.3   The scope was withdrawn and the procedure completed. COMPLICATIONS: There were no  immediate complications.  ENDOSCOPIC IMPRESSION: 1.   The examined terminal ileum appeared to be normal 2.   Normal colonoscopy; multiple biopsies were performed using cold forceps to r/o microscopic colitis 3.   Mild diverticulosis was noted in the ascending colon  RECOMMENDATIONS: 1.  Await pathology results 2.  You should continue to follow colorectal cancer screening guidelines for "routine risk" patients with a repeat colonoscopy in 10 years.  There is no need for FOBT (stool) testing for at least 5 years.  eSigned:  Harl Bowie, MD 11/07/2015 3:55 PM     PATIENT NAME:  Bailey [name], Bailey [name] MR#: [id number]

## 2015-11-07 NOTE — Progress Notes (Signed)
Patient awakening,vss,report to rn 

## 2015-11-07 NOTE — Progress Notes (Signed)
Called to room to assist during endoscopic procedure.  Patient ID and intended procedure confirmed with present staff. Received instructions for my participation in the procedure from the performing physician.  

## 2015-11-07 NOTE — Patient Instructions (Signed)
YOU HAD AN ENDOSCOPIC PROCEDURE TODAY AT [facility]:   Refer to the procedure report that was given to you for any specific questions about what was found during the examination.  If the procedure report does not answer your questions, please call your gastroenterologist to clarify.  If you requested that your care partner not be given the details of your procedure findings, then the procedure report has been included in a sealed envelope for you to review at your convenience later.  YOU SHOULD EXPECT: Some feelings of bloating in the abdomen. Passage of more gas than usual.  Walking can help get rid of the air that was put into your GI tract during the procedure and reduce the bloating. If you had a lower endoscopy (such as a colonoscopy or flexible sigmoidoscopy) you may notice spotting of blood in your stool or on the toilet paper. If you underwent a bowel prep for your procedure, you may not have a normal bowel movement for a few days.  Please Note:  You might notice some irritation and congestion in your nose or some drainage.  This is from the oxygen used during your procedure.  There is no need for concern and it should clear up in a day or so.  SYMPTOMS TO REPORT IMMEDIATELY:   Following lower endoscopy (colonoscopy or flexible sigmoidoscopy):  Excessive amounts of blood in the stool  Significant tenderness or worsening of abdominal pains  Swelling of the abdomen that is new, acute  Fever of 100F or higher   For urgent or emergent issues, a gastroenterologist can be reached at any hour by calling [phone].   DIET: Your first meal following the procedure should be a small meal and then it is ok to progress to your normal diet. Heavy or fried foods are harder to digest and may make you feel nauseous or bloated.  Likewise, meals heavy in dairy and vegetables can increase bloating.  Drink plenty of fluids but you should avoid alcoholic beverages for 24  hours.  ACTIVITY:  You should plan to take it easy for the rest of today and you should NOT DRIVE or use heavy machinery until tomorrow (because of the sedation medicines used during the test).    FOLLOW UP: Our staff will call the number listed on your records the next business day following your procedure to check on you and address any questions or concerns that you may have regarding the information given to you following your procedure. If we do not reach you, we will leave a message.  However, if you are feeling well and you are not experiencing any problems, there is no need to return our call.  We will assume that you have returned to your regular daily activities without incident.  If any biopsies were taken you will be contacted by phone or by letter within the next 1-3 weeks.  Please call us at [phone] if you have not heard about the biopsies in 3 weeks.    SIGNATURES/CONFIDENTIALITY: You and/or your care partner have signed paperwork which will be entered into your electronic medical record.  These signatures attest to the fact that that the information above on your After Visit Summary has been reviewed and is understood.  Full responsibility of the confidentiality of this discharge information lies with you and/or your care-partner.  Await pathology results Diverticulosis handout given

## 2015-11-08 ENCOUNTER — Telehealth: Payer: Self-pay | Admitting: Emergency Medicine

## 2015-11-08 LAB — GLUCOSE, CAPILLARY: Glucose-Capillary: 137 mg/dL — ABNORMAL HIGH (ref 65–99)

## 2015-11-08 NOTE — Telephone Encounter (Signed)
  Follow up Call-  Call back number 11/07/2015  Post procedure Call Back phone  # [phone] cell  Permission to leave phone message Yes     Patient questions:  Do you have a fever, pain , or abdominal swelling? No. Pain Score  0 *  Have you tolerated food without any problems? Yes.    Have you been able to return to your normal activities? Yes.    Do you have any questions about your discharge instructions: Diet   No. Medications  No. Follow up visit  No.  Do you have questions or concerns about your Care? No.  Actions: * If pain score is 4 or above: No action needed, pain <4.

## 2015-11-14 ENCOUNTER — Encounter: Payer: Self-pay | Admitting: Gastroenterology

## 2016-01-02 ENCOUNTER — Other Ambulatory Visit: Payer: Self-pay | Admitting: Family Medicine

## 2016-01-02 DIAGNOSIS — N6310 Unspecified lump in the right breast, unspecified quadrant: Secondary | ICD-10-CM

## 2016-01-08 ENCOUNTER — Ambulatory Visit
Admission: RE | Admit: 2016-01-08 | Discharge: 2016-01-08 | Disposition: A | Discharge status: Home / Self Care | Source: Ambulatory Visit | Attending: Family Medicine | Admitting: Family Medicine

## 2016-01-08 DIAGNOSIS — N6310 Unspecified lump in the right breast, unspecified quadrant: Secondary | ICD-10-CM

## 2016-01-16 ENCOUNTER — Ambulatory Visit: Admitting: Family Medicine

## 2016-01-20 ENCOUNTER — Other Ambulatory Visit

## 2016-01-20 ENCOUNTER — Encounter: Payer: Self-pay | Admitting: Family Medicine

## 2016-01-20 ENCOUNTER — Ambulatory Visit (INDEPENDENT_AMBULATORY_CARE_PROVIDER_SITE_OTHER): Admitting: Family Medicine

## 2016-01-20 VITALS — BP 132/72 | HR 92 | Temp 98.1°F | Ht 65.75 in | Wt 213.2 lb

## 2016-01-20 DIAGNOSIS — M546 Pain in thoracic spine: Secondary | ICD-10-CM

## 2016-01-20 DIAGNOSIS — E785 Hyperlipidemia, unspecified: Secondary | ICD-10-CM

## 2016-01-20 DIAGNOSIS — E119 Type 2 diabetes mellitus without complications: Secondary | ICD-10-CM

## 2016-01-20 DIAGNOSIS — D171 Benign lipomatous neoplasm of skin and subcutaneous tissue of trunk: Secondary | ICD-10-CM | POA: Diagnosis not present

## 2016-01-20 DIAGNOSIS — M25561 Pain in right knee: Secondary | ICD-10-CM | POA: Diagnosis not present

## 2016-01-20 DIAGNOSIS — R7309 Other abnormal glucose: Secondary | ICD-10-CM

## 2016-01-20 DIAGNOSIS — E669 Obesity, unspecified: Secondary | ICD-10-CM

## 2016-01-20 LAB — BASIC METABOLIC PANEL
BUN: 6 mg/dL (ref 6–23)
CALCIUM: 9.8 mg/dL (ref 8.4–10.5)
CO2: 27 mEq/L (ref 19–32)
Chloride: 103 mEq/L (ref 96–112)
Creatinine, Ser: 0.56 mg/dL (ref 0.40–1.20)
GFR: 141.73 mL/min (ref >60.00)
Glucose, Bld: 180 mg/dL — ABNORMAL HIGH (ref 70–99)
POTASSIUM: 3.9 meq/L (ref 3.5–5.1)
SODIUM: 137 meq/L (ref 135–145)

## 2016-01-20 LAB — LIPID PANEL
CHOL/HDL RATIO: 4
Cholesterol: 148 mg/dL (ref 0–200)
HDL: 41.6 mg/dL (ref >39.00)
LDL CALC: 83 mg/dL (ref 0–99)
NonHDL: 106.24
Triglycerides: 117 mg/dL (ref 0.0–149.0)
VLDL: 23.4 mg/dL (ref 0.0–40.0)

## 2016-01-20 LAB — HEMOGLOBIN A1C
HEMOGLOBIN A1C: 8.1 % — AB (ref <5.7)
Mean Plasma Glucose: 186 mg/dL

## 2016-01-20 MED ORDER — MELOXICAM 15 MG PO TABS: 15.0000 mg | ORAL_TABLET | Freq: Every day | ORAL | Status: DC

## 2016-01-20 MED ORDER — LANCETS MISC: Status: DC

## 2016-01-20 NOTE — Patient Instructions (Signed)
Continue to use mobic as needed for knee pain I will refer you to an eye doctor and to have an ultrasound for your back Depending on your A1c results we can plan to recheck in 4- 6 months [place] job with your weight!

## 2016-01-20 NOTE — Progress Notes (Signed)
Pre visit review using our clinic tool,if applicable. No additional management support is needed unless otherwise documented below in the visit note.  

## 2016-01-20 NOTE — Progress Notes (Signed)
[place] at [facility] [address], [place], [place] [address] [phone]  Date:  01/20/2016   Name:  Bailey [name]   DOB:  [birth year]   MRN:  [id number]  PCP:  Lamar Blinks, MD    Chief Complaint: Follow-up   History of Present Illness:  Bailey [name] is a 60 y.o. very pleasant female patient who presents with the following:  History of DM, hyperlipidemia, HTN Here today to follow-up her DM- her last A1c had gone up a bit due to poor diet over the holidays  Last lipids 6 months ago She has been working hard on her diet and hopes that she will see improvement.  She has lost a few lbs Fasting today for labs  She uses mobic as needed for her knee pain/ OA and this does help. She uses flexeril just as needed as well "if I have a hectic day at work."  She needs mobic but not flexeril  Also she has noted a fullness in the left side of her upper back under the bra line- this has her a bit concerned.  It does not hurt, NKI  She is overdue for an eye exam and needs a referral for same Wt Readings from Last 3 Encounters:  01/20/16 213 lb 3.2 oz (96.707 kg)  11/07/15 216 lb (97.977 kg)  10/28/15 215 lb 9.6 oz (97.796 kg)   BP Readings from Last 3 Encounters:  01/20/16 132/72  11/07/15 135/64  10/28/15 122/80     Lab Results  Component Value Date   HGBA1C 7.6 10/28/2015     Patient Active Problem List   Diagnosis Date Noted  . GERD (gastroesophageal reflux disease) 07/26/2015  . Hyperlipidemia 07/26/2015  . Dyspepsia 07/26/2015  . Essential hypertension 07/26/2015  . Fibrocystic breast disease 03/22/2015  . Type II or unspecified type diabetes mellitus with unspecified complication, uncontrolled 02/16/2013    Past Medical History  Diagnosis Date  . Diabetes mellitus without complication ([place])   . Hypertension   . Arthritis   . Fibrocystic breast disease July 2016    Past Surgical History  Procedure  Laterality Date  . Cholecystectomy    . Uterine fibroid surgery      Social History  Substance Use Topics  . Smoking status: Current Every Day Smoker -- 1.00 packs/day for 20 years    Types: Cigarettes  . Smokeless tobacco: Never Used     Comment: Tobacco info given 10/11/15  . Alcohol Use: No    Family History  Problem Relation Age of Onset  . Diabetes Mother   . Stroke Mother   . Dementia Mother   . Heart disease Father   . Diabetes Father   . Diabetes Brother   . Dementia Brother   . Renal Disease Brother   . Diabetes Sister   . Narcolepsy Sister   . Arthritis Sister   . Diabetes Sister   . Obesity Sister   . Diabetes Brother   . Heart disease Brother   . Cancer Brother     pancreatic  . Diabetes Brother   . Diabetes Brother   . Colon cancer Neg Hx   . Esophageal cancer Neg Hx   . Stomach cancer Neg Hx   . Rectal cancer Neg Hx     No Known Allergies  Medication list has been reviewed and updated.  Current Outpatient Prescriptions on File Prior to Visit  Medication Sig Dispense Refill  .  acetaminophen (TYLENOL) 500 MG tablet Take 1,000 mg by mouth every 6 (six) hours as needed (pain). Reported on 09/26/2015    . atorvastatin (LIPITOR) 40 MG tablet Take 1 tablet (40 mg total) by mouth daily. 90 tablet 3  . blood glucose meter kit and supplies KIT Dispense based on patient and insurance preference. Use up to four times daily as directed. (FOR ICD-9 250.00, 250.01). 1 each 0  . cholestyramine (QUESTRAN) 4 g packet Take 1 packet (4 g total) by mouth 2 (two) times daily. 60 each 3  . cyclobenzaprine (FLEXERIL) 5 MG tablet Take 1 tablet (5 mg total) by mouth at bedtime. 30 tablet 5  . glimepiride (AMARYL) 4 MG tablet Take 2 tablets (8 mg total) by mouth daily with breakfast. 180 tablet 3  . glucose blood test strip Test blood sugar daily. Dx code: E11.8. 100 each 3  . hydrochlorothiazide (MICROZIDE) 12.5 MG capsule Take 1 capsule (12.5 mg total) by mouth daily. 90  capsule 3  . lisinopril (PRINIVIL,ZESTRIL) 20 MG tablet Take 1 tablet (20 mg total) by mouth daily. 90 tablet 3  . meloxicam (MOBIC) 15 MG tablet Take 1 tablet (15 mg total) by mouth daily. Use as needed for knee and back pain 30 tablet 1  . metFORMIN (GLUCOPHAGE-XR) 500 MG 24 hr tablet Take 2 tablets (1,000 mg total) by mouth 2 (two) times daily with a meal. 360 tablet 2  . pantoprazole (PROTONIX) 40 MG tablet Take 1 tablet (40 mg total) by mouth daily. 90 tablet 3   No current facility-administered medications on file prior to visit.    Review of Systems:  As per HPI- otherwise negative.   Physical Examination: Filed Vitals:   01/20/16 0927  BP: 132/72  Pulse: 92  Temp: 98.1 F (36.7 C)   Filed Vitals:   01/20/16 0927  Height: 5' 5.75" (1.67 m)  Weight: 213 lb 3.2 oz (96.707 kg)   Body mass index is 34.68 kg/(m^2). Ideal Body Weight: Weight in (lb) to have BMI = 25: 153.4  GEN: WDWN, NAD, Non-toxic, A & O x 3, obese, looks well HEENT: Atraumatic, Normocephalic. Neck supple. No masses, No LAD. Ears and Nose: No external deformity. CV: RRR, No M/G/R. No JVD. No thrill. No extra heart sounds. PULM: CTA B, no wheezes, crackles, rhonchi. No retractions. No resp. distress. No accessory muscle use. EXTR: No c/c/e NEURO Normal gait.  PSYCH: Normally interactive. Conversant. Not depressed or anxious appearing.  Calm demeanor.  She has what seems to be a lipoma on her left upper back at the bra line.  It it difficult to determine size due to subcue fat. It is firm but not hard suggestive of a lipoma   Assessment and Plan: Controlled type 2 diabetes mellitus without complication, without long-term current use of insulin ([place]) - Plan: Ambulatory referral to Ophthalmology, Lancets MISC, Basic metabolic panel, Hemoglobin A1c  Pain in right knee - Plan: meloxicam (MOBIC) 15 MG tablet  Right-sided thoracic back pain - Plan: meloxicam (MOBIC) 15 MG tablet  Lipoma of back - Plan: [place]  Misc Soft Tissue  Hyperlipidemia - Plan: Lipid panel  Obesity, unspecified    Referral for [place] of her back and to see eye doctor Did her refills for her She has lost some weight- great job.  Await A1c for her today  Signed Lamar Blinks, MD

## 2016-01-21 ENCOUNTER — Ambulatory Visit (HOSPITAL_BASED_OUTPATIENT_CLINIC_OR_DEPARTMENT_OTHER)
Admission: RE | Admit: 2016-01-21 | Discharge: 2016-01-21 | Disposition: A | Discharge status: Home / Self Care | Source: Ambulatory Visit | Attending: Family Medicine | Admitting: Family Medicine

## 2016-01-21 ENCOUNTER — Telehealth: Payer: Self-pay | Admitting: Family Medicine

## 2016-01-21 DIAGNOSIS — D179 Benign lipomatous neoplasm, unspecified: Secondary | ICD-10-CM | POA: Diagnosis present

## 2016-01-21 DIAGNOSIS — D171 Benign lipomatous neoplasm of skin and subcutaneous tissue of trunk: Secondary | ICD-10-CM

## 2016-01-21 NOTE — Telephone Encounter (Signed)
Order entered.  Imaging called and made aware.

## 2016-01-21 NOTE — Telephone Encounter (Signed)
Caller name:Chris/CT Scan Relationship to patient: Can be reached:[phone] Pharmacy:  Reason for call:Reqesting order be changed from US soft tissue to [place] Chest for lipoma. You may call if you have any questions

## 2016-02-11 LAB — HM DIABETES EYE EXAM

## 2016-04-08 ENCOUNTER — Ambulatory Visit (INDEPENDENT_AMBULATORY_CARE_PROVIDER_SITE_OTHER): Admitting: Family Medicine

## 2016-04-08 VITALS — BP 136/90 | HR 90 | Temp 98.3°F | Resp 18 | Ht 66.0 in | Wt 209.0 lb

## 2016-04-08 DIAGNOSIS — R197 Diarrhea, unspecified: Secondary | ICD-10-CM | POA: Diagnosis not present

## 2016-04-08 DIAGNOSIS — E860 Dehydration: Secondary | ICD-10-CM | POA: Diagnosis not present

## 2016-04-08 DIAGNOSIS — R0789 Other chest pain: Secondary | ICD-10-CM | POA: Diagnosis not present

## 2016-04-08 DIAGNOSIS — E119 Type 2 diabetes mellitus without complications: Secondary | ICD-10-CM | POA: Diagnosis not present

## 2016-04-08 DIAGNOSIS — R059 Cough, unspecified: Secondary | ICD-10-CM

## 2016-04-08 DIAGNOSIS — F17200 Nicotine dependence, unspecified, uncomplicated: Secondary | ICD-10-CM

## 2016-04-08 DIAGNOSIS — R05 Cough: Secondary | ICD-10-CM

## 2016-04-08 LAB — POCT URINALYSIS DIP (MANUAL ENTRY)
BILIRUBIN UA: NEGATIVE
Glucose, UA: NEGATIVE
Ketones, POC UA: NEGATIVE
Leukocytes, UA: NEGATIVE
NITRITE UA: NEGATIVE
PH UA: 5
Protein Ur, POC: NEGATIVE
RBC UA: NEGATIVE
Spec Grav, UA: 1.02
Urobilinogen, UA: 0.2

## 2016-04-08 LAB — POCT CBC
Granulocyte percent: 56.3 %G (ref 37–80)
HEMATOCRIT: 40.3 % (ref 37.7–47.9)
HEMOGLOBIN: 13.5 g/dL (ref 12.2–16.2)
LYMPH, POC: 3.5 — AB (ref 0.6–3.4)
MCH, POC: 23.3 pg — AB (ref 27–31.2)
MCHC: 33.5 g/dL (ref 31.8–35.4)
MCV: 69.7 fL — AB (ref 80–97)
MID (cbc): 0.6 (ref 0–0.9)
MPV: 8.1 fL (ref 0–99.8)
POC GRANULOCYTE: 5.2 (ref 2–6.9)
POC LYMPH PERCENT: 37.4 %L (ref 10–50)
POC MID %: 6.3 %M (ref 0–12)
Platelet Count, POC: 293 10*3/uL (ref 142–424)
RBC: 5.79 M/uL — AB (ref 4.04–5.48)
RDW, POC: 17.1 %
WBC: 9.3 10*3/uL (ref 4.6–10.2)

## 2016-04-08 LAB — POC MICROSCOPIC URINALYSIS (UMFC)

## 2016-04-08 LAB — GLUCOSE, POCT (MANUAL RESULT ENTRY): POC GLUCOSE: 212 mg/dL — AB (ref 70–99)

## 2016-04-08 MED ORDER — PROMETHAZINE-CODEINE 6.25-10 MG/5ML PO SYRP: 5.0000 mL | ORAL_SOLUTION | Freq: Four times a day (QID) | ORAL | Status: DC | PRN

## 2016-04-08 MED ORDER — GUAIFENESIN ER 1200 MG PO TB12: 1.0000 | ORAL_TABLET | Freq: Two times a day (BID) | ORAL | Status: DC | PRN

## 2016-04-08 MED ORDER — IPRATROPIUM BROMIDE 0.02 % IN SOLN
0.5000 mg | Freq: Once | RESPIRATORY_TRACT | Status: AC
Start: 2016-04-08 — End: 2016-04-08
  Administered 2016-04-08: 0.5 mg via RESPIRATORY_TRACT

## 2016-04-08 MED ORDER — ALBUTEROL SULFATE (2.5 MG/3ML) 0.083% IN NEBU
2.5000 mg | INHALATION_SOLUTION | Freq: Once | RESPIRATORY_TRACT | Status: AC
  Administered 2016-04-08: 2.5 mg via RESPIRATORY_TRACT

## 2016-04-08 NOTE — Patient Instructions (Addendum)
IF you received an x-ray today, you will receive an invoice from [facility]. Please contact [facility] at [phone] with questions or concerns regarding your invoice.   IF you received labwork today, you will receive an invoice from [employer]. Please contact [facility] at [phone] with questions or concerns regarding your invoice.   Our billing staff will not be able to assist you with questions regarding bills from these companies.  You will be contacted with the lab results as soon as they are available. The fastest way to get your results is to activate your My Chart account. Instructions are located on the last page of this paperwork. If you have not heard from [place] regarding the results in 2 weeks, please contact this office.    Dehydration, Adult Dehydration is a condition in which you do not have enough fluid or water in your body. It happens when you take in less fluid than you lose. Vital organs such as the kidneys, brain, and heart cannot function without a proper amount of fluids. Any loss of fluids from the body can cause dehydration.  Dehydration can range from mild to severe. This condition should be treated right away to help prevent it from becoming severe. CAUSES  This condition may be caused by:  Vomiting.  Diarrhea.  Excessive sweating, such as when exercising in hot or humid weather.  Not drinking enough fluid during strenuous exercise or during an illness.  Excessive urine output.  Fever.  Certain medicines. RISK FACTORS This condition is more likely to develop in:  People who are taking certain medicines that cause the body to lose excess fluid (diuretics).   People who have a chronic illness, such as diabetes, that may increase urination.  Older adults.   People who live at high altitudes.   People who participate in endurance sports.  SYMPTOMS  Mild Dehydration  Thirst.  Dry  lips.  Slightly dry mouth.  Dry, warm skin. Moderate Dehydration  Very dry mouth.   Muscle cramps.   Dark urine and decreased urine production.   Decreased tear production.   Headache.   Light-headedness, especially when you stand up from a sitting position.  Severe Dehydration  Changes in skin.   Cold and clammy skin.   Skin does not spring back quickly when lightly pinched and released.   Changes in body fluids.   Extreme thirst.   No tears.   Not able to sweat when body temperature is high, such as in hot weather.   Minimal urine production.   Changes in vital signs.   Rapid, weak pulse (more than 100 beats per minute when you are sitting still).   Rapid breathing.   Low blood pressure.   Other changes.   Sunken eyes.   Cold hands and feet.   Confusion.  Lethargy and difficulty being awakened.  Fainting (syncope).   Short-term weight loss.   Unconsciousness. DIAGNOSIS  This condition may be diagnosed based on your symptoms. You may also have tests to determine how severe your dehydration is. These tests may include:   Urine tests.   Blood tests.  TREATMENT  Treatment for this condition depends on the severity. Mild or moderate dehydration can often be treated at home. Treatment should be started right away. Do not wait until dehydration becomes severe. Severe dehydration needs to be treated at the hospital. Treatment for Mild Dehydration  Drinking plenty of water to replace the fluid you have lost.  Replacing minerals in your blood (electrolytes) that you may have lost.  Treatment for Moderate Dehydration  Consuming oral rehydration solution (ORS). Treatment for Severe Dehydration  Receiving fluid through an IV tube.   Receiving electrolyte solution through a feeding tube that is passed through your nose and into your stomach (nasogastric tube or NG tube).  Correcting any abnormalities in  electrolytes. HOME CARE INSTRUCTIONS   Drink enough fluid to keep your urine clear or pale yellow.   Drink water or fluid slowly by taking small sips. You can also try sucking on ice cubes.  Have food or beverages that contain electrolytes. Examples include bananas and sports drinks.  Take over-the-counter and prescription medicines only as told by your health care provider.   Prepare ORS according to the manufacturer's instructions. Take sips of ORS every 5 minutes until your urine returns to normal.  If you have vomiting or diarrhea, continue to try to drink water, ORS, or both.   If you have diarrhea, avoid:   Beverages that contain caffeine.   Fruit juice.   Milk.   Carbonated soft drinks.  Do not take salt tablets. This can lead to the condition of having too much sodium in your body (hypernatremia).  SEEK MEDICAL CARE IF:  You cannot eat or drink without vomiting.  You have had moderate diarrhea during a period of more than 24 hours.  You have a fever. SEEK IMMEDIATE MEDICAL CARE IF:   You have extreme thirst.  You have severe diarrhea.  You have not urinated in 6-8 hours, or you have urinated only a small amount of very dark urine.  You have shriveled skin.  You are dizzy, confused, or both.   This information is not intended to replace advice given to you by your health care provider. Make sure you discuss any questions you have with your health care provider.   Document Released: 09/07/2005 Document Revised: 05/29/2015 Document Reviewed: 01/23/2015 Elsevier Interactive Patient Education 2016 [place]. Diabetes and Sick Day Management Blood sugar (glucose) can be more difficult to control when you are sick. Colds, fever, flu, nausea, vomiting, and diarrhea are all examples of common illnesses that can cause problems for people with diabetes. Loss of body fluids (dehydration) from fever, vomiting, diarrhea, infection, and the stress of a  sickness can all cause blood glucose levels to increase. Because of this, it is very important to take your diabetes medicines and to eat some form of carbohydrate food when you are sick. Liquid or soft foods are often tolerated, and they help to replace fluids. HOME CARE INSTRUCTIONS These main guidelines are intended for managing a short-term (24 hours or less) sickness:  Take your usual dose of insulin or oral diabetes medicine. An exception would be if you take any form of metformin. If you cannot eat or drink, you can become dehydrated and should not take this medicine.  Continue to take your insulin even if you are unable to eat solid foods or are vomiting. Your insulin dose may stay the same, or it may need to be increased when you are sick.  You will need to test your blood glucose more often, generally every 2-4 hours. If you have type 1 diabetes, test your urine for ketones every 4 hours. If you have type 2 diabetes, test your urine for ketones as directed by your health care provider.  Eat some form of food that contains carbohydrates. The carbohydrates can be in solid or liquid form. You should eat  45-50 g of carbohydrates every 3-4 hours.  Replace fluids if you have a fever, vomit, or have diarrhea. Ask your health care provider for specific rehydration instructions.  Watch carefully for the signs of ketoacidosis if you have type 1 diabetes. Call your health care provider if any of the following symptoms are present, especially in children:  Moderate to large ketones in the urine along with a high blood glucose level.  Severe nausea.  Vomiting.  Diarrhea.  Abdominal pain.  Rapid breathing.  Drink extra liquids that do not contain sugar such as water.  Be careful with over-the-counter medicines. Read the labels. They may contain sugar or types of sugars that can increase your blood glucose level. Food Choices for Illness All of the food choices below contain about 15 g of  carbohydrates. Plan ahead and keep some of these foods around.    to  cup carbonated beverage containing sugar. Carbonated beverages will usually be better tolerated if they are opened and left at room temperature for a few minutes.   of a twin frozen ice pop.   cup regular gelatin.   cup juice.   cup ice cream or frozen yogurt.   cup cooked cereal.   cup sherbet.  1 cup clear broth or soup.  1 cup cream soup.   cup regular custard.   cup regular pudding.  1 cup sports drink.  1 cup plain yogurt.  1 slice toast.  6 squares saltine crackers.  5 vanilla wafers. SEEK MEDICAL CARE IF:   You are unable to drink fluids, even small amounts.  You have nausea and vomiting for more than 6 hours.  You have diarrhea for more than 6 hours.  Your blood glucose level is more than 240 mg/dL, even with additional insulin.  There is a change in mental status.  You develop an additional serious sickness.  You have been sick for 2 days and are not getting better.  You have a fever. SEEK IMMEDIATE MEDICAL CARE IF:  You have difficulty breathing.  You have moderate to large ketone levels. MAKE SURE YOU:  Understand these instructions.  Will watch your condition.  Will get help right away if you are not doing well or get worse.   This information is not intended to replace advice given to you by your health care provider. Make sure you discuss any questions you have with your health care provider.   Document Released: 09/10/2003 Document Revised: 09/28/2014 Document Reviewed: 02/14/2013 Elsevier Interactive Patient Education 2016 Elsevier Inc[employer]  Upper Respiratory Infection, Adult Most upper respiratory infections (URIs) are a viral infection of the air passages leading to the lungs. A URI affects the nose, throat, and upper air passages. The most common type of URI is nasopharyngitis and is typically referred to as "the common cold." URIs run their course and  usually go away on their own. Most of the time, a URI does not require medical attention, but sometimes a bacterial infection in the upper airways can follow a viral infection. This is called a secondary infection. Sinus and middle ear infections are common types of secondary upper respiratory infections. Bacterial pneumonia can also complicate a URI. A URI can worsen asthma and chronic obstructive pulmonary disease (COPD). Sometimes, these complications can require emergency medical care and may be life threatening.  CAUSES Almost all URIs are caused by viruses. A virus is a type of germ and can spread from one person to another.  RISKS FACTORS You may be at  risk for a URI if:   You smoke.   You have chronic heart or lung disease.  You have a weakened defense (immune) system.   You are very young or very old.   You have nasal allergies or asthma.  You work in crowded or poorly ventilated areas.  You work in health care facilities or schools. SIGNS AND SYMPTOMS  Symptoms typically develop 2-3 days after you come in contact with a cold virus. Most viral URIs last 7-10 days. However, viral URIs from the influenza virus (flu virus) can last 14-18 days and are typically more severe. Symptoms may include:   Runny or stuffy (congested) nose.   Sneezing.   Cough.   Sore throat.   Headache.   Fatigue.   Fever.   Loss of appetite.   Pain in your forehead, behind your eyes, and over your cheekbones (sinus pain).  Muscle aches.  DIAGNOSIS  Your health care provider may diagnose a URI by:  Physical exam.  Tests to check that your symptoms are not due to another condition such as:  Strep throat.  Sinusitis.  Pneumonia.  Asthma. TREATMENT  A URI goes away on its own with time. It cannot be cured with medicines, but medicines may be prescribed or recommended to relieve symptoms. Medicines may help:  Reduce your fever.  Reduce your cough.  Relieve nasal  congestion. HOME CARE INSTRUCTIONS   Take medicines only as directed by your health care provider.   Gargle warm saltwater or take cough drops to comfort your throat as directed by your health care provider.  Use a warm mist humidifier or inhale steam from a shower to increase air moisture. This may make it easier to breathe.  Drink enough fluid to keep your urine clear or pale yellow.   Eat soups and other clear broths and maintain good nutrition.   Rest as needed.   Return to work when your temperature has returned to normal or as your health care provider advises. You may need to stay home longer to avoid infecting others. You can also use a face mask and careful hand washing to prevent spread of the virus.  Increase the usage of your inhaler if you have asthma.   Do not use any tobacco products, including cigarettes, chewing tobacco, or electronic cigarettes. If you need help quitting, ask your health care provider. PREVENTION  The best way to protect yourself from getting a cold is to practice good hygiene.   Avoid oral or hand contact with people with cold symptoms.   Wash your hands often if contact occurs.  There is no clear evidence that vitamin C, vitamin E, echinacea, or exercise reduces the chance of developing a cold. However, it is always recommended to get plenty of rest, exercise, and practice good nutrition.  SEEK MEDICAL CARE IF:   You are getting worse rather than better.   Your symptoms are not controlled by medicine.   You have chills.  You have worsening shortness of breath.  You have brown or red mucus.  You have yellow or brown nasal discharge.  You have pain in your face, especially when you bend forward.  You have a fever.  You have swollen neck glands.  You have pain while swallowing.  You have white areas in the back of your throat. SEEK IMMEDIATE MEDICAL CARE IF:   You have severe or persistent:  Headache.  Ear  pain.  Sinus pain.  Chest pain.  You have chronic lung  disease and any of the following:  Wheezing.  Prolonged cough.  Coughing up blood.  A change in your usual mucus.  You have a stiff neck.  You have changes in your:  Vision.  Hearing.  Thinking.  Mood. MAKE SURE YOU:   Understand these instructions.  Will watch your condition.  Will get help right away if you are not doing well or get worse.   This information is not intended to replace advice given to you by your health care provider. Make sure you discuss any questions you have with your health care provider.   Document Released: 03/03/2001 Document Revised: 01/22/2015 Document Reviewed: 12/13/2013 Elsevier Interactive Patient Education [employer].

## 2016-04-08 NOTE — Progress Notes (Addendum)
By signing my name below I, Tereasa Coop, attest that this documentation has been prepared under the direction and in the presence of Delman Cheadle, MD. Electonically Signed. Tereasa Coop, Scribe 04/08/2016 at 3:31 PM   Subjective:    Patient ID: Bailey [name], female    DOB: 05-20-1956, 60 y.o.   MRN: [id number]  Chief Complaint  Patient presents with  . Diarrhea    symptoms started 4 days ago  . Fever  . Nasal Congestion  . Dizziness  . Sore Throat    HPI Bailey [name] is a 60 y.o. female who presents to the [facility] complaining of diarrhea for the past 4 days. Pt also reports fever, chest congestion, cough, HA, lightheadedness and sore throat. Symptoms started with sore throat 4 days ago. Sore throat has resolved. Cough is productive with clear phlegm. Pt also reports CP and SOB with cough only. Pt denies any palpitations or having any CP before the cough started. Pt also has decreased appetite. Pt has been drinking plenty of water and Gatorade. Pt denies any vomiting, abd pain.  Pt states she has been urinating normally and urine has been a normal color. Pt has not been checking her blood sugar while she has been sick.   Pt has history of type II DM and is an active smoker. Pt has been smoking less while sick.     Patient Active Problem List   Diagnosis Date Noted  . GERD (gastroesophageal reflux disease) 07/26/2015  . Hyperlipidemia 07/26/2015  . Dyspepsia 07/26/2015  . Essential hypertension 07/26/2015  . Fibrocystic breast disease 03/22/2015  . Type II or unspecified type diabetes mellitus with unspecified complication, uncontrolled 02/16/2013    Current Outpatient Prescriptions on File Prior to Visit  Medication Sig Dispense Refill  . atorvastatin (LIPITOR) 40 MG tablet Take 1 tablet (40 mg total) by mouth daily. 90 tablet 3  . blood glucose meter kit and supplies KIT Dispense based on patient and insurance preference. Use up to four times  daily as directed. (FOR ICD-9 250.00, 250.01). 1 each 0  . cyclobenzaprine (FLEXERIL) 5 MG tablet Take 1 tablet (5 mg total) by mouth at bedtime. 30 tablet 5  . glimepiride (AMARYL) 4 MG tablet Take 2 tablets (8 mg total) by mouth daily with breakfast. 180 tablet 3  . glucose blood test strip Test blood sugar daily. Dx code: E11.8. 100 each 3  . Lancets MISC Pt uses One Touch.  Test 1 or 2 times day 100 each 1  . lisinopril (PRINIVIL,ZESTRIL) 20 MG tablet Take 1 tablet (20 mg total) by mouth daily. 90 tablet 3  . meloxicam (MOBIC) 15 MG tablet Take 1 tablet (15 mg total) by mouth daily. Use as needed for knee and back pain 30 tablet 2  . metFORMIN (GLUCOPHAGE-XR) 500 MG 24 hr tablet Take 2 tablets (1,000 mg total) by mouth 2 (two) times daily with a meal. 360 tablet 2  . pantoprazole (PROTONIX) 40 MG tablet Take 1 tablet (40 mg total) by mouth daily. 90 tablet 3  . acetaminophen (TYLENOL) 500 MG tablet Take 1,000 mg by mouth every 6 (six) hours as needed (pain). Reported on 04/08/2016    . cholestyramine (QUESTRAN) 4 g packet Take 1 packet (4 g total) by mouth 2 (two) times daily. (Patient not taking: Reported on 04/08/2016) 60 each 3  . hydrochlorothiazide (MICROZIDE) 12.5 MG capsule Take 1 capsule (12.5 mg total) by mouth daily. (Patient not taking: Reported on  04/08/2016) 90 capsule 3   No current facility-administered medications on file prior to visit.    No Known Allergies  Depression screen [facility] 2/9 04/08/2016 01/20/2016 10/28/2015 09/26/2015 02/07/2015  Decreased Interest 0 0 0 0 0  Down, Depressed, Hopeless 1 0 0 0 0  PHQ - 2 Score 1 0 0 0 0        Review of Systems  Constitutional: Positive for fever.  HENT: Positive for congestion and sore throat.   Eyes: Negative for visual disturbance.  Respiratory: Positive for cough and shortness of breath.   Cardiovascular: Positive for chest pain.  Gastrointestinal: Positive for diarrhea.  Genitourinary: Negative for dysuria.    Musculoskeletal: Negative for back pain.  Skin: Negative for rash.  Neurological: Positive for light-headedness.  Psychiatric/Behavioral: The patient is not nervous/anxious.        Objective:  BP 118/54 mmHg  Pulse 112  Temp(Src) 98.3 F (36.8 C)  Resp 18  Ht 5' 6"  (1.676 m)  Wt 209 lb (94.802 kg)  BMI 33.75 kg/m2  SpO2 97%  Physical Exam  Constitutional: She is oriented to person, place, and time. She appears well-developed and well-nourished. No distress.  HENT:  Head: Normocephalic and atraumatic.  Right Ear: Hearing, tympanic membrane, external ear and ear canal normal.  Left Ear: Hearing, tympanic membrane, external ear and ear canal normal.  Nares pale, boggy, and rt nares is occluded. Clear postnasal drip, otherwise oropharynx normal.   Eyes: Conjunctivae are normal. Pupils are equal, round, and reactive to light.  Neck: Neck supple.  Cardiovascular: Regular rhythm.  Tachycardia present.  Exam reveals no gallop and no friction rub.   Murmur (1/6 flow murmur) heard. Pulmonary/Chest: Effort normal. She has decreased breath sounds. She has no wheezes. She has no rhonchi. She has no rales.  Musculoskeletal: Normal range of motion.  Lymphadenopathy:       Head (right side): Submandibular adenopathy present.       Head (left side): Submandibular adenopathy present.       Right: No supraclavicular adenopathy present.       Left: No supraclavicular adenopathy present.  Neurological: She is alert and oriented to person, place, and time.  Skin: Skin is warm and dry.  Psychiatric: She has a normal mood and affect. Her behavior is normal.  Nursing note and vitals reviewed.  Results for orders placed or performed in visit on 04/08/16  POCT CBC  Result Value Ref Range   WBC 9.3 4.6 - 10.2 K/uL   Lymph, poc 3.5 (A) 0.6 - 3.4   POC LYMPH PERCENT 37.4 10 - 50 %L   MID (cbc) 0.6 0 - 0.9   POC MID % 6.3 0 - 12 %M   POC Granulocyte 5.2 2 - 6.9   Granulocyte percent 56.3 37 - 80  %G   RBC 5.79 (A) 4.04 - 5.48 M/uL   Hemoglobin 13.5 12.2 - 16.2 g/dL   HCT, POC 40.3 37.7 - 47.9 %   MCV 69.7 (A) 80 - 97 fL   MCH, POC 23.3 (A) 27 - 31.2 pg   MCHC 33.5 31.8 - 35.4 g/dL   RDW, POC 17.1 %   Platelet Count, POC 293 142 - 424 K/uL   MPV 8.1 0 - 99.8 fL  POCT glucose (manual entry)  Result Value Ref Range   POC Glucose 212 (A) 70 - 99 mg/dl  POCT urinalysis dipstick  Result Value Ref Range   Color, UA yellow yellow   Clarity, UA clear  clear   Glucose, UA negative negative   Bilirubin, UA negative negative   Ketones, POC UA negative negative   Spec Grav, UA 1.020    Blood, UA negative negative   pH, UA 5.0    Protein Ur, POC negative negative   Urobilinogen, UA 0.2    Nitrite, UA Negative Negative   Leukocytes, UA Negative Negative  POCT Microscopic Urinalysis ([facility])  Result Value Ref Range   WBC,UR,HPF,POC None None WBC/hpf   RBC,UR,HPF,POC None None RBC/hpf   Bacteria Few (A) None, Too numerous to count   Mucus Present (A) Absent   Epithelial Cells, UR Per Microscopy Many (A) None, Too numerous to count cells/hpf    1526 hrs  Pt states that she did not notice a difference in her breathing after the breathing treatment. Reevaluation: upon auscultation, pt's breathing is improved with increased air movement. Pt still tachycardic. After a full liter of IV fluids pt states that she does not need to urinate.   BP 136/90  HR 90  Orthostatics positive    Assessment & Plan:   1. Dehydration   2. Type 2 diabetes mellitus treated without insulin ([place])   3. Tobacco use disorder   4. Cough   5. Left-sided chest wall pain    Sxs sig improved after 2L IVF.  Continue symptomatic care for presumed viral syndrome.  Orders Placed This Encounter  Procedures  . Comprehensive metabolic panel  . Orthostatic vital signs  . POCT CBC  . POCT glucose (manual entry)  . POCT urinalysis dipstick  . POCT Microscopic Urinalysis ([facility])  . Insert peripheral IV    Meds  ordered this encounter  Medications  . albuterol (PROVENTIL) (2.5 MG/3ML) 0.083% nebulizer solution 2.5 mg    Sig:   . ipratropium (ATROVENT) nebulizer solution 0.5 mg    Sig:   . promethazine-codeine (PHENERGAN WITH CODEINE) 6.25-10 MG/5ML syrup    Sig: Take 5 mLs by mouth every 6 (six) hours as needed for cough.    Dispense:  180 mL    Refill:  0  . Guaifenesin (MUCINEX MAXIMUM STRENGTH) 1200 MG TB12    Sig: Take 1 tablet (1,200 mg total) by mouth every 12 (twelve) hours as needed.    Dispense:  14 tablet    Refill:  1    I personally performed the services described in this documentation, which was scribed in my presence. The recorded information has been reviewed and considered, and addended by me as needed.   Delman Cheadle, M.D.  [facility] [address] [place], [place] [address] [phone] phone [phone] fax  04/11/2016 9:08 PM

## 2016-04-09 LAB — COMPREHENSIVE METABOLIC PANEL
ALBUMIN: 4.1 g/dL (ref 3.6–5.1)
ALK PHOS: 139 U/L — AB (ref 33–130)
ALT: 41 U/L — AB (ref 6–29)
AST: 22 U/L (ref 10–35)
BILIRUBIN TOTAL: 0.4 mg/dL (ref 0.2–1.2)
BUN: 12 mg/dL (ref 7–25)
CO2: 21 mmol/L (ref 20–31)
CREATININE: 0.87 mg/dL (ref 0.50–0.99)
Calcium: 9.5 mg/dL (ref 8.6–10.4)
Chloride: 100 mmol/L (ref 98–110)
Glucose, Bld: 196 mg/dL — ABNORMAL HIGH (ref 65–99)
Potassium: 3.5 mmol/L (ref 3.5–5.3)
Sodium: 136 mmol/L (ref 135–146)
TOTAL PROTEIN: 7.6 g/dL (ref 6.1–8.1)

## 2016-04-15 ENCOUNTER — Encounter: Payer: Self-pay | Admitting: Family Medicine

## 2016-04-15 ENCOUNTER — Ambulatory Visit (INDEPENDENT_AMBULATORY_CARE_PROVIDER_SITE_OTHER): Admitting: Family Medicine

## 2016-04-15 VITALS — BP 138/56 | Temp 98.1°F | Ht 66.0 in | Wt 219.0 lb

## 2016-04-15 DIAGNOSIS — R05 Cough: Secondary | ICD-10-CM

## 2016-04-15 DIAGNOSIS — R7989 Other specified abnormal findings of blood chemistry: Secondary | ICD-10-CM | POA: Diagnosis not present

## 2016-04-15 DIAGNOSIS — R062 Wheezing: Secondary | ICD-10-CM

## 2016-04-15 DIAGNOSIS — J209 Acute bronchitis, unspecified: Secondary | ICD-10-CM

## 2016-04-15 DIAGNOSIS — R059 Cough, unspecified: Secondary | ICD-10-CM

## 2016-04-15 DIAGNOSIS — R945 Abnormal results of liver function studies: Secondary | ICD-10-CM

## 2016-04-15 DIAGNOSIS — E119 Type 2 diabetes mellitus without complications: Secondary | ICD-10-CM | POA: Diagnosis not present

## 2016-04-15 LAB — HEPATIC FUNCTION PANEL
ALK PHOS: 106 U/L (ref 39–117)
ALT: 27 U/L (ref 0–35)
AST: 19 U/L (ref 0–37)
Albumin: 3.8 g/dL (ref 3.5–5.2)
BILIRUBIN DIRECT: 0 mg/dL (ref 0.0–0.3)
BILIRUBIN TOTAL: 0.3 mg/dL (ref 0.2–1.2)
TOTAL PROTEIN: 7.1 g/dL (ref 6.0–8.3)

## 2016-04-15 LAB — HEMOGLOBIN A1C: Hgb A1c MFr Bld: 9.4 % — ABNORMAL HIGH (ref 4.6–6.5)

## 2016-04-15 MED ORDER — FLUTICASONE PROPIONATE HFA 110 MCG/ACT IN AERO: 1.0000 | INHALATION_SPRAY | Freq: Two times a day (BID) | RESPIRATORY_TRACT | 12 refills | Status: DC

## 2016-04-15 MED ORDER — DOXYCYCLINE HYCLATE 100 MG PO CAPS: 100.0000 mg | ORAL_CAPSULE | Freq: Two times a day (BID) | ORAL | 0 refills | Status: DC

## 2016-04-15 MED ORDER — ALBUTEROL SULFATE (2.5 MG/3ML) 0.083% IN NEBU: 2.5000 mg | INHALATION_SOLUTION | Freq: Once | RESPIRATORY_TRACT | Status: DC

## 2016-04-15 MED ORDER — ALBUTEROL SULFATE HFA 108 (90 BASE) MCG/ACT IN AERS: 2.0000 | INHALATION_SPRAY | Freq: Four times a day (QID) | RESPIRATORY_TRACT | 0 refills | Status: DC | PRN

## 2016-04-15 MED ORDER — HYDROCODONE-HOMATROPINE 5-1.5 MG/5ML PO SYRP: 5.0000 mL | ORAL_SOLUTION | Freq: Three times a day (TID) | ORAL | 0 refills | Status: DC | PRN

## 2016-04-15 NOTE — Progress Notes (Signed)
Pre visit review using our clinic review tool, if applicable. No additional management support is needed unless otherwise documented below in the visit note. 

## 2016-04-15 NOTE — Patient Instructions (Signed)
It was good to see you today- let me know if you are not feeling better in the next few days- Sooner if worse.  We will check on your A1c and also your liver function (was slightly elevated) and I will contact you with these results For your current symptoms, take the doxycycline antibiotic twice a day for 10 days Use the cough syrup as needed; remember it will make you sleepy Do not combine this with your other cough syrup or any other sedating medications Use the floven/ fluticasone inhaled steroid 1 puff twice daily for the next 2-3 week (if very expensive you do not have to get this med) Use the albuterol inhaler as needed for cough or wheezing as needed

## 2016-04-15 NOTE — Progress Notes (Signed)
[place] at [facility] [address] [place], [place] [address] [phone]  Date:  04/15/2016   Name:  Bailey [name]   DOB:  17-Aug-1956   MRN:  [id number]  PCP:  Lamar Blinks, MD    Chief Complaint: Follow-up (Pt here for f/u after being seen at urgent care on 04/08/16. Still prod cough with clear mucus pt also feeling fatiuge. )   History of Present Illness:  Bailey [name] is a 60 y.o. very pleasant female patient who presents with the following:  Last seen by myself in May for a DM follow-up  Lab Results  Component Value Date   HGBA1C 8.1 (H) 01/20/2016   She was seen at [facility] on 7/19 with several days of diarrhea, cough, chest congestion.  She was given IV hydration, albuterol neb while in the office, rx for phenergan codeine   Her diarrhea is now resolved, but she continues to have a cough and to feel bad overall.  She has been coughing for about 10 days total.  The cough can be productive.   She was given a cough syrup at her UC visit but is about out of it She did feel feverish but did not check her temp at home She felt tired and awful overall.  She has noted chills but no aches.   She is eating normally again.  Her bowels are working normally for her.  No vomiting or diarrhea at this time She did have a ST but this is now resolved.  Wt Readings from Last 3 Encounters:  04/15/16 219 lb (99.3 kg)  04/08/16 209 lb (94.8 kg)  01/20/16 213 lb 3.2 oz (96.7 kg)   Discussed recent increase in her A1c. She is on metformin 1000 BID and glimperide.  She has tried to work on her diet but is not sure how good of a job she has been doing.  She really does not want to start on insulin  Patient Active Problem List   Diagnosis Date Noted  . GERD (gastroesophageal reflux disease) 07/26/2015  . Hyperlipidemia 07/26/2015  . Dyspepsia 07/26/2015  . Essential hypertension 07/26/2015  . Fibrocystic breast disease 03/22/2015  .  Type 2 diabetes mellitus treated without insulin ([place]) 02/16/2013    Past Medical History:  Diagnosis Date  . Arthritis   . Diabetes mellitus without complication ([place])   . Fibrocystic breast disease July 2016  . Hypertension     Past Surgical History:  Procedure Laterality Date  . CHOLECYSTECTOMY    . UTERINE FIBROID SURGERY      Social History  Substance Use Topics  . Smoking status: Current Every Day Smoker    Packs/day: 1.00    Years: 20.00    Types: Cigarettes  . Smokeless tobacco: Never Used     Comment: Tobacco info given 10/11/15  . Alcohol use No    Family History  Problem Relation Age of Onset  . Diabetes Mother   . Stroke Mother   . Dementia Mother   . Heart disease Father   . Diabetes Father   . Diabetes Brother   . Dementia Brother   . Renal Disease Brother   . Diabetes Sister   . Narcolepsy Sister   . Arthritis Sister   . Diabetes Sister   . Obesity Sister   . Diabetes Brother   . Heart disease Brother   . Cancer Brother     pancreatic  . Diabetes Brother   .  Diabetes Brother   . Colon cancer Neg Hx   . Esophageal cancer Neg Hx   . Stomach cancer Neg Hx   . Rectal cancer Neg Hx     No Known Allergies  Medication list has been reviewed and updated.  Current Outpatient Prescriptions on File Prior to Visit  Medication Sig Dispense Refill  . acetaminophen (TYLENOL) 500 MG tablet Take 1,000 mg by mouth every 6 (six) hours as needed (pain). Reported on 04/08/2016    . atorvastatin (LIPITOR) 40 MG tablet Take 1 tablet (40 mg total) by mouth daily. 90 tablet 3  . blood glucose meter kit and supplies KIT Dispense based on patient and insurance preference. Use up to four times daily as directed. (FOR ICD-9 250.00, 250.01). 1 each 0  . cholestyramine (QUESTRAN) 4 g packet Take 1 packet (4 g total) by mouth 2 (two) times daily. 60 each 3  . cyclobenzaprine (FLEXERIL) 5 MG tablet Take 1 tablet (5 mg total) by mouth at bedtime. 30 tablet 5  .  glimepiride (AMARYL) 4 MG tablet Take 2 tablets (8 mg total) by mouth daily with breakfast. 180 tablet 3  . glucose blood test strip Test blood sugar daily. Dx code: E11.8. 100 each 3  . Guaifenesin (MUCINEX MAXIMUM STRENGTH) 1200 MG TB12 Take 1 tablet (1,200 mg total) by mouth every 12 (twelve) hours as needed. 14 tablet 1  . hydrochlorothiazide (MICROZIDE) 12.5 MG capsule Take 1 capsule (12.5 mg total) by mouth daily. 90 capsule 3  . Lancets MISC Pt uses One Touch.  Test 1 or 2 times day 100 each 1  . lisinopril (PRINIVIL,ZESTRIL) 20 MG tablet Take 1 tablet (20 mg total) by mouth daily. 90 tablet 3  . meloxicam (MOBIC) 15 MG tablet Take 1 tablet (15 mg total) by mouth daily. Use as needed for knee and back pain 30 tablet 2  . metFORMIN (GLUCOPHAGE-XR) 500 MG 24 hr tablet Take 2 tablets (1,000 mg total) by mouth 2 (two) times daily with a meal. 360 tablet 2  . pantoprazole (PROTONIX) 40 MG tablet Take 1 tablet (40 mg total) by mouth daily. 90 tablet 3  . promethazine-codeine (PHENERGAN WITH CODEINE) 6.25-10 MG/5ML syrup Take 5 mLs by mouth every 6 (six) hours as needed for cough. 180 mL 0   No current facility-administered medications on file prior to visit.     Review of Systems:  As per HPI- otherwise negative.   Physical Examination: Vitals:   04/15/16 1020  BP: (!) 138/56  Temp: 98.1 F (36.7 C)   Vitals:   04/15/16 1020  Weight: 219 lb (99.3 kg)  Height: 5' 6"  (1.676 m)   Body mass index is 35.35 kg/m. Ideal Body Weight: Weight in (lb) to have BMI = 25: 154.6  GEN: WDWN, NAD, Non-toxic, A & O x 3, coughing in room HEENT: Atraumatic, Normocephalic. Neck supple. No masses, No LAD.  Bilateral TM wnl, oropharynx normal.  PEERL,EOMI.   Ears and Nose: No external deformity. CV: RRR, No M/G/R. No JVD. No thrill. No extra heart sounds. PULM: CTA B, no crackles, rhonchi. No retractions. No resp. distress. No accessory muscle use. Mild to moderate wheezing bilaterally  ABD: S, NT,  ND, +BS. No rebound. No HSM. EXTR: No c/c/e NEURO Normal gait.  PSYCH: Normally interactive. Conversant. Not depressed or anxious appearing.  Calm demeanor.   Albuterol neb- cleared her wheezing Assessment and Plan: Acute bronchitis, unspecified organism - Plan: doxycycline (VIBRAMYCIN) 100 MG capsule, albuterol (PROVENTIL HFA;VENTOLIN HFA)  108 (90 Base) MCG/ACT inhaler  Wheezing - Plan: fluticasone (FLOVENT HFA) 110 MCG/ACT inhaler, albuterol (PROVENTIL) (2.5 MG/3ML) 0.083% nebulizer solution 2.5 mg, albuterol (PROVENTIL HFA;VENTOLIN HFA) 108 (90 Base) MCG/ACT inhaler  Cough - Plan: HYDROcodone-homatropine (HYCODAN) 5-1.5 MG/5ML syrup  Type 2 diabetes mellitus treated without insulin ([facility]) - Plan: Hemoglobin A1c  Elevated LFTs - Plan: Hepatic function panel  Follow-up A1c today and also mild elevation of LFTs.   It was good to see you today- let me know if you are not feeling better in the next few days- Sooner if worse.  We will check on your A1c and also your liver function (was slightly elevated) and I will contact you with these results For your current symptoms, take the doxycycline antibiotic twice a day for 10 days Use the cough syrup as needed; remember it will make you sleepy Do not combine this with your other cough syrup or any other sedating medications Use the floven/ fluticasone inhaled steroid 1 puff twice daily for the next 2-3 week (if very expensive you do not have to get this med) Use the albuterol inhaler as needed for cough or wheezing as needed  Signed Lamar Blinks, MD

## 2016-07-07 ENCOUNTER — Other Ambulatory Visit: Payer: Self-pay | Admitting: Physician Assistant

## 2016-07-27 ENCOUNTER — Other Ambulatory Visit: Payer: Self-pay | Admitting: Physician Assistant

## 2016-07-27 DIAGNOSIS — R1013 Epigastric pain: Secondary | ICD-10-CM

## 2016-07-27 DIAGNOSIS — E119 Type 2 diabetes mellitus without complications: Secondary | ICD-10-CM

## 2016-07-29 NOTE — Telephone Encounter (Signed)
04/15/16 last ov

## 2016-08-06 ENCOUNTER — Ambulatory Visit (INDEPENDENT_AMBULATORY_CARE_PROVIDER_SITE_OTHER): Admitting: Family Medicine

## 2016-08-06 ENCOUNTER — Encounter: Payer: Self-pay | Admitting: Family Medicine

## 2016-08-06 ENCOUNTER — Other Ambulatory Visit

## 2016-08-06 VITALS — BP 141/64 | HR 92 | Temp 98.3°F | Ht 66.0 in | Wt 216.6 lb

## 2016-08-06 DIAGNOSIS — I10 Essential (primary) hypertension: Secondary | ICD-10-CM | POA: Diagnosis not present

## 2016-08-06 DIAGNOSIS — Z23 Encounter for immunization: Secondary | ICD-10-CM

## 2016-08-06 DIAGNOSIS — E119 Type 2 diabetes mellitus without complications: Secondary | ICD-10-CM

## 2016-08-06 DIAGNOSIS — M542 Cervicalgia: Secondary | ICD-10-CM

## 2016-08-06 MED ORDER — CYCLOBENZAPRINE HCL 5 MG PO TABS: 5.0000 mg | ORAL_TABLET | Freq: Every day | ORAL | 3 refills | Status: DC

## 2016-08-06 MED ORDER — LISINOPRIL 20 MG PO TABS: 20.0000 mg | ORAL_TABLET | Freq: Every day | ORAL | 3 refills | Status: DC

## 2016-08-06 NOTE — Patient Instructions (Addendum)
It was good to see you today- you got your flu shot today, your pneumonia shot is up to date I refilled your lisinopril for your blood pressure and your flexeril to use as needed for neck pain. Remember the flexeril will make you feel sleepy! If your a1c is still over 8% we will need to add another medication for your diabetes- I will let you know  You went to [facility] in the past- I gave you a sheet with their contact info

## 2016-08-06 NOTE — Progress Notes (Signed)
Pre visit review using our clinic review tool, if applicable. No additional management support is needed unless otherwise documented below in the visit note. 

## 2016-08-06 NOTE — Progress Notes (Signed)
[place] at [facility] [address] [place], [place] [address] [phone]  Date:  08/06/2016   Name:  Bailey [name]   DOB:  [birth year]   MRN:  [id number]  PCP:  Lamar Blinks, MD    Chief Complaint: No chief complaint on file.   History of Present Illness:  Bailey [name] is a 60 y.o. very pleasant female patient who presents with the following:  Here today to recheck on her DM- last A1c showed poor control as below.  I had offered her trulicity or similar but she did not respond to [employer] so I am not sure if she wished to add this medication   She is using amaryl 6m in the am and metformin 1,000 BID  She needs a refill of her flexeril and lisinopril today. She uses flexeril as needed for muscle pains after work - does not take every day but just when she has neck pain and tension She is due for a flu shot today  She has not been checking her glucose at home-  She did get my message in July but thought her sugar was high just from using cough syrup.   She hopes that it will be better today Her eye exam is UTD; needs her optho contact info to schedule her follow-up visit Pneumonia shot 2 years ago.    Lab Results  Component Value Date   HGBA1C 9.4 (H) 04/15/2016     Patient Active Problem List   Diagnosis Date Noted  . GERD (gastroesophageal reflux disease) 07/26/2015  . Hyperlipidemia 07/26/2015  . Dyspepsia 07/26/2015  . Essential hypertension 07/26/2015  . Fibrocystic breast disease 03/22/2015  . Type 2 diabetes mellitus treated without insulin ([place] 02/16/2013    Past Medical History:  Diagnosis Date  . Arthritis   . [place] complication [place]   . Fibrocystic breast disease July 2016  . Hypertension     Past Surgical History:  Procedure Laterality Date  . CHOLECYSTECTOMY    . UTERINE FIBROID SURGERY      Social History  Substance Use Topics  . Smoking status:  Current Every Day Smoker    Packs/day: 1.00    Years: 20.00    Types: Cigarettes  . Smokeless tobacco: Never Used     Comment: Tobacco info given 10/11/15  . Alcohol use No    Family History  Problem Relation Age of Onset  . Diabetes Mother   . Stroke Mother   . Dementia Mother   . Heart disease Father   . Diabetes Father   . Diabetes Brother   . Dementia Brother   . Renal Disease Brother   . Diabetes Sister   . Narcolepsy Sister   . Arthritis Sister   . Diabetes Sister   . Obesity Sister   . Diabetes Brother   . Heart disease Brother   . Cancer Brother     pancreatic  . Diabetes Brother   . Diabetes Brother   . Colon cancer Neg Hx   . Esophageal cancer Neg Hx   . Stomach cancer Neg Hx   . Rectal cancer Neg Hx     No Known Allergies  Medication list has been reviewed and updated.  Current Outpatient Prescriptions on File Prior to Visit  Medication Sig Dispense Refill  . acetaminophen (TYLENOL) 500 MG tablet Take 1,000 mg by mouth every 6 (six) hours as needed (pain). Reported on 04/08/2016    .  albuterol (PROVENTIL HFA;VENTOLIN HFA) 108 (90 Base) MCG/ACT inhaler Inhale 2 puffs into the lungs every 6 (six) hours as needed for wheezing or shortness of breath. 1 Inhaler 0  . atorvastatin (LIPITOR) 40 MG tablet Take 1 tablet (40 mg total) by mouth daily. 90 tablet 3  . blood glucose meter kit and supplies KIT Dispense based on patient and insurance preference. Use up to four times daily as directed. (FOR ICD-9 250.00, 250.01). 1 each 0  . cholestyramine (QUESTRAN) 4 g packet Take 1 packet (4 g total) by mouth 2 (two) times daily. 60 each 3  . cyclobenzaprine (FLEXERIL) 5 MG tablet Take 1 tablet (5 mg total) by mouth at bedtime. 30 tablet 5  . doxycycline (VIBRAMYCIN) 100 MG capsule Take 1 capsule (100 mg total) by mouth 2 (two) times daily. 20 capsule 0  . fluticasone (FLOVENT HFA) 110 MCG/ACT inhaler Inhale 1 puff into the lungs 2 (two) times daily. 1 Inhaler 12  .  glimepiride (AMARYL) 4 MG tablet TAKE TWO TABLETS BY MOUTH ONCE DAILY WITH  BREAKFAST 180 tablet 3  . glucose blood test strip Test blood sugar daily. Dx code: E11.8. 100 each 3  . Guaifenesin (MUCINEX MAXIMUM STRENGTH) 1200 MG TB12 Take 1 tablet (1,200 mg total) by mouth every 12 (twelve) hours as needed. 14 tablet 1  . hydrochlorothiazide (MICROZIDE) 12.5 MG capsule Take 1 capsule (12.5 mg total) by mouth daily. 90 capsule 3  . HYDROcodone-homatropine (HYCODAN) 5-1.5 MG/5ML syrup Take 5 mLs by mouth every 8 (eight) hours as needed for cough. 90 mL 0  . Lancets MISC Pt uses One Touch.  Test 1 or 2 times day 100 each 1  . lisinopril (PRINIVIL,ZESTRIL) 20 MG tablet Take 1 tablet (20 mg total) by mouth daily. 90 tablet 3  . meloxicam (MOBIC) 15 MG tablet Take 1 tablet (15 mg total) by mouth daily. Use as needed for knee and back pain 30 tablet 2  . metFORMIN (GLUCOPHAGE-XR) 500 MG 24 hr tablet TAKE TWO TABLETS BY MOUTH TWICE DAILY WITH  A  MEAL. 360 tablet 0  . pantoprazole (PROTONIX) 40 MG tablet TAKE ONE TABLET BY MOUTH ONCE DAILY 90 tablet 3  . promethazine-codeine (PHENERGAN WITH CODEINE) 6.25-10 MG/5ML syrup Take 5 mLs by mouth every 6 (six) hours as needed for cough. 180 mL 0   Current Facility-Administered Medications on File Prior to Visit  Medication Dose Route Frequency Provider Last Rate Last Dose  . albuterol (PROVENTIL) (2.5 MG/3ML) 0.083% nebulizer solution 2.5 mg  2.5 mg Nebulization Once Gay Filler Copland, MD        Review of Systems:  As per HPI- otherwise negative. No fever.  She has noted a bit of a stuffy and runny nose recenly and is using OTC cold preps for this.  No significant cough No chest pain or SOB  Physical Examination: Blood pressure (!) 141/64, pulse 92, temperature 98.3 F (36.8 C), temperature source Oral, height 5' 6" (1.676 m), weight 216 lb 9.6 oz (98.2 kg), SpO2 100 %.  Ideal Body Weight:    GEN: WDWN, NAD, Non-toxic, A & O x 3, obese, looks  well HEENT: Atraumatic, Normocephalic. Neck supple. No masses, No LAD.  Bilateral TM wnl, oropharynx normal.  PEERL,EOMI.   Ears and Nose: No external deformity. CV: RRR, No M/G/R. No JVD. No thrill. No extra heart sounds. PULM: CTA B, no wheezes, crackles, rhonchi. No retractions. No resp. distress. No accessory muscle use. EXTR: No c/c/e NEURO Normal gait.  PSYCH: Normally interactive. Conversant. Not depressed or anxious appearing.  Calm demeanor.  Foot exam today- normal  Assessment and Plan: Controlled type 2 diabetes mellitus without complication, without long-term current use of insulin (HCC) - Plan: Hemoglobin A1C  Essential hypertension - Plan: lisinopril (PRINIVIL,ZESTRIL) 20 MG tablet  Neck pain - Plan: cyclobenzaprine (FLEXERIL) 5 MG tablet  Encounter for immunization - Plan: Flu Vaccine QUAD 36+ mos IM  Here today to follow-up on her DM Will check A1c today- if still over 8% will need to add a medication Refilled lisinopril and flexeril Flu shot today Will plan further follow- up pending labs.   Signed Lamar Blinks, MD

## 2016-08-07 LAB — HEMOGLOBIN A1C
Hgb A1c MFr Bld: 8.4 % — ABNORMAL HIGH (ref <5.7)
MEAN PLASMA GLUCOSE: 194 mg/dL

## 2016-08-15 NOTE — Progress Notes (Signed)
Called pt to go over her recent A1c  Results for orders placed or performed in visit on 08/06/16  Hemoglobin A1c  Result Value Ref Range   Hgb A1c MFr Bld 8.4 (H) <5.7 %   Mean Plasma Glucose 194 mg/dL   Right now she is on amaryl 8 BID, metformin 1,000 BID Lisinopril 20, hctz 12.5, lipitor 40  Her A1c has improved from 9.4 but still not at goal. Discussed adding an injectable such as trulicity.  She would like to continue trying to change her lifestyle and repeat A1c in 3 months.   If she is still over 8% then she is willing to add another medication.  She will make a good effort for the next 3 months  Wt Readings from Last 3 Encounters:  08/06/16 216 lb 9.6 oz (98.2 kg)  04/15/16 219 lb (99.3 kg)  04/08/16 209 lb (94.8 kg)

## 2016-09-30 ENCOUNTER — Other Ambulatory Visit: Payer: Self-pay | Admitting: Physician Assistant

## 2016-09-30 DIAGNOSIS — E785 Hyperlipidemia, unspecified: Secondary | ICD-10-CM

## 2016-10-05 ENCOUNTER — Other Ambulatory Visit: Payer: Self-pay | Admitting: Physician Assistant

## 2016-10-10 ENCOUNTER — Other Ambulatory Visit: Payer: Self-pay | Admitting: Physician Assistant

## 2016-10-10 DIAGNOSIS — E785 Hyperlipidemia, unspecified: Secondary | ICD-10-CM

## 2016-10-12 ENCOUNTER — Telehealth: Payer: Self-pay | Admitting: Family Medicine

## 2016-10-12 NOTE — Telephone Encounter (Signed)
Patient is requesting that all of her medication be put under Dr. Lillie Fragmin name as she is having troubles getting her medications from the pharmacy. She states that they keep calling the wrong doctor for refills and they continue to get denied. Please advise.   Phone: [phone]

## 2016-10-13 NOTE — Telephone Encounter (Signed)
Bailey [name] please give her a call-   I would ask her to look through all of her medication bottles and see which ones do NOT have my name on them.  Does she need any refills now?  If so which meds does she need refilled?   For any medication that do NOT have my name, if she would please send [place] a refill request or mychart message when these are due I will be able to rx them and get them under my name

## 2016-10-14 NOTE — Telephone Encounter (Signed)
Called pt to verify which med refills she needed. Informed by pt that meds have already been refilled and that she doesn't need anymore refills at this time.

## 2016-10-25 ENCOUNTER — Other Ambulatory Visit: Payer: Self-pay | Admitting: Urgent Care

## 2016-10-25 DIAGNOSIS — I10 Essential (primary) hypertension: Secondary | ICD-10-CM

## 2016-11-19 ENCOUNTER — Other Ambulatory Visit (HOSPITAL_COMMUNITY)
Admission: RE | Admit: 2016-11-19 | Discharge: 2016-11-19 | Disposition: A | Discharge status: Home / Self Care | Source: Ambulatory Visit | Attending: Family Medicine | Admitting: Family Medicine

## 2016-11-19 ENCOUNTER — Ambulatory Visit (INDEPENDENT_AMBULATORY_CARE_PROVIDER_SITE_OTHER): Admitting: Family Medicine

## 2016-11-19 ENCOUNTER — Encounter: Payer: Self-pay | Admitting: Family Medicine

## 2016-11-19 VITALS — BP 122/84 | HR 96 | Temp 98.1°F | Ht 66.0 in | Wt 214.8 lb

## 2016-11-19 DIAGNOSIS — Z124 Encounter for screening for malignant neoplasm of cervix: Secondary | ICD-10-CM | POA: Diagnosis not present

## 2016-11-19 DIAGNOSIS — R945 Abnormal results of liver function studies: Secondary | ICD-10-CM

## 2016-11-19 DIAGNOSIS — Z01419 Encounter for gynecological examination (general) (routine) without abnormal findings: Secondary | ICD-10-CM | POA: Insufficient documentation

## 2016-11-19 DIAGNOSIS — E785 Hyperlipidemia, unspecified: Secondary | ICD-10-CM | POA: Diagnosis not present

## 2016-11-19 DIAGNOSIS — E119 Type 2 diabetes mellitus without complications: Secondary | ICD-10-CM

## 2016-11-19 DIAGNOSIS — Z1151 Encounter for screening for human papillomavirus (HPV): Secondary | ICD-10-CM | POA: Diagnosis present

## 2016-11-19 DIAGNOSIS — Z130 Encounter for screening for diseases of the blood and blood-forming organs and certain disorders involving the immune mechanism: Secondary | ICD-10-CM

## 2016-11-19 DIAGNOSIS — Z720 Tobacco use: Secondary | ICD-10-CM

## 2016-11-19 DIAGNOSIS — R7989 Other specified abnormal findings of blood chemistry: Secondary | ICD-10-CM | POA: Diagnosis not present

## 2016-11-19 DIAGNOSIS — Z113 Encounter for screening for infections with a predominantly sexual mode of transmission: Secondary | ICD-10-CM | POA: Insufficient documentation

## 2016-11-19 DIAGNOSIS — N8110 Cystocele, unspecified: Secondary | ICD-10-CM

## 2016-11-19 DIAGNOSIS — Z0000 Encounter for general adult medical examination without abnormal findings: Secondary | ICD-10-CM

## 2016-11-19 DIAGNOSIS — I10 Essential (primary) hypertension: Secondary | ICD-10-CM

## 2016-11-19 LAB — COMPREHENSIVE METABOLIC PANEL
ALBUMIN: 3.9 g/dL (ref 3.5–5.2)
ALT: 47 U/L — ABNORMAL HIGH (ref 0–35)
AST: 22 U/L (ref 0–37)
Alkaline Phosphatase: 120 U/L — ABNORMAL HIGH (ref 39–117)
BUN: 8 mg/dL (ref 6–23)
CALCIUM: 9.7 mg/dL (ref 8.4–10.5)
CHLORIDE: 102 meq/L (ref 96–112)
CO2: 27 mEq/L (ref 19–32)
Creatinine, Ser: 0.52 mg/dL (ref 0.40–1.20)
GFR: 153.95 mL/min (ref >60.00)
GLUCOSE: 208 mg/dL — AB (ref 70–99)
Potassium: 3.5 mEq/L (ref 3.5–5.1)
Sodium: 134 mEq/L — ABNORMAL LOW (ref 135–145)
TOTAL PROTEIN: 7.4 g/dL (ref 6.0–8.3)
Total Bilirubin: 0.3 mg/dL (ref 0.2–1.2)

## 2016-11-19 LAB — CBC
HCT: 37.1 % (ref 36.0–46.0)
Hemoglobin: 11.7 g/dL — ABNORMAL LOW (ref 12.0–15.0)
MCHC: 31.6 g/dL (ref 30.0–36.0)
MCV: 70.5 fl — AB (ref 78.0–100.0)
PLATELETS: 305 10*3/uL (ref 150.0–400.0)
RBC: 5.25 Mil/uL — AB (ref 3.87–5.11)
RDW: 18.2 % — ABNORMAL HIGH (ref 11.5–15.5)
WBC: 10.2 10*3/uL (ref 4.0–10.5)

## 2016-11-19 LAB — LIPID PANEL
CHOL/HDL RATIO: 3
Cholesterol: 151 mg/dL (ref 0–200)
HDL: 47.2 mg/dL (ref >39.00)
LDL Cholesterol: 79 mg/dL (ref 0–99)
NONHDL: 103.49
Triglycerides: 123 mg/dL (ref 0.0–149.0)
VLDL: 24.6 mg/dL (ref 0.0–40.0)

## 2016-11-19 LAB — HEMOGLOBIN A1C
HEMOGLOBIN A1C: 7.7 % — AB (ref <5.7)
Mean Plasma Glucose: 174 mg/dL

## 2016-11-19 NOTE — Progress Notes (Addendum)
[place] at [facility] [address] [place], [place] [address] [phone]  Date:  11/19/2016   Name:  Bailey [name]   DOB:  01/12/1956   MRN:  [id number]  PCP:  Lamar Blinks, MD    Chief Complaint: Annual Exam (Pt here for CPE. Last PAP 12/2013. Would like PAP today. )   History of Present Illness:  Bailey [name] is a 61 y.o. very pleasant female patient who presents with the following:  Here for CPE today.  Last CPE in 2016.  Last labs in July, 2017.  She is here today for a CPE, and to follow-up on her DM She did have some cereal this am- about 5 hours ago now  Wt Readings from Last 3 Encounters:  11/19/16 214 lb 12.8 oz (97.4 kg)  08/06/16 216 lb 9.6 oz (98.2 kg)  04/15/16 219 lb (99.3 kg)   She has lost a few lbs and has really been working on her diet and exercise- she hopes that this will be reflected in her A1c today  Last pap in 2015- this was normal except she did have trich at the time.  She has not been SA since and would like to repeat a pap today She has a prolapsed bladder- this has been present for some time and does not generally bother her so she has not sought any treatment for same   She is a long time smoker and would be interested in having a screening CT for lung cancer  Patient Active Problem List   Diagnosis Date Noted  . GERD (gastroesophageal reflux disease) 07/26/2015  . Hyperlipidemia 07/26/2015  . Dyspepsia 07/26/2015  . Essential hypertension 07/26/2015  . Fibrocystic breast disease 03/22/2015  . Type 2 diabetes mellitus treated without insulin ([place]) 02/16/2013    Past Medical History:  Diagnosis Date  . Arthritis   . Diabetes mellitus without complication ([place])   . Fibrocystic breast disease July 2016  . Hypertension     Past Surgical History:  Procedure Laterality Date  . CHOLECYSTECTOMY    . UTERINE FIBROID SURGERY      Social History  Substance Use Topics  .  Smoking status: Current Every Day Smoker    Packs/day: 1.00    Years: 20.00    Types: Cigarettes  . Smokeless tobacco: Never Used     Comment: Tobacco info given 10/11/15  . Alcohol use No    Family History  Problem Relation Age of Onset  . Diabetes Mother   . Stroke Mother   . Dementia Mother   . Heart disease Father   . Diabetes Father   . Diabetes Brother   . Dementia Brother   . Renal Disease Brother   . Diabetes Sister   . Narcolepsy Sister   . Arthritis Sister   . Diabetes Sister   . Obesity Sister   . Diabetes Brother   . Heart disease Brother   . Cancer Brother     pancreatic  . Diabetes Brother   . Diabetes Brother   . Colon cancer Neg Hx   . Esophageal cancer Neg Hx   . Stomach cancer Neg Hx   . Rectal cancer Neg Hx     No Known Allergies  Medication list has been reviewed and updated.  Current Outpatient Prescriptions on File Prior to Visit  Medication Sig Dispense Refill  . acetaminophen (TYLENOL) 500 MG tablet Take 1,000 mg by mouth every 6 (  six) hours as needed (pain). Reported on 04/08/2016    . albuterol (PROVENTIL HFA;VENTOLIN HFA) 108 (90 Base) MCG/ACT inhaler Inhale 2 puffs into the lungs every 6 (six) hours as needed for wheezing or shortness of breath. 1 Inhaler 0  . atorvastatin (LIPITOR) 40 MG tablet TAKE ONE TABLET BY MOUTH ONCE DAILY 90 tablet 3  . blood glucose meter kit and supplies KIT Dispense based on patient and insurance preference. Use up to four times daily as directed. (FOR ICD-9 250.00, 250.01). 1 each 0  . cholestyramine (QUESTRAN) 4 g packet Take 1 packet (4 g total) by mouth 2 (two) times daily. 60 each 3  . cyclobenzaprine (FLEXERIL) 5 MG tablet Take 1 tablet (5 mg total) by mouth at bedtime. Use as needed for neck pain 30 tablet 3  . fluticasone (FLOVENT HFA) 110 MCG/ACT inhaler Inhale 1 puff into the lungs 2 (two) times daily. 1 Inhaler 12  . glimepiride (AMARYL) 4 MG tablet TAKE TWO TABLETS BY MOUTH ONCE DAILY WITH  BREAKFAST  180 tablet 3  . glucose blood test strip Test blood sugar daily. Dx code: E11.8. 100 each 3  . hydrochlorothiazide (MICROZIDE) 12.5 MG capsule TAKE ONE CAPSULE BY MOUTH ONCE DAILY 90 capsule 3  . Lancets MISC Pt uses One Touch.  Test 1 or 2 times day 100 each 1  . lisinopril (PRINIVIL,ZESTRIL) 20 MG tablet Take 1 tablet (20 mg total) by mouth daily. 90 tablet 3  . meloxicam (MOBIC) 15 MG tablet Take 1 tablet (15 mg total) by mouth daily. Use as needed for knee and back pain 30 tablet 2  . metFORMIN (GLUCOPHAGE-XR) 500 MG 24 hr tablet TAKE TWO TABLETS BY MOUTH TWICE DAILY WITH A MEAL 360 tablet 0  . pantoprazole (PROTONIX) 40 MG tablet TAKE ONE TABLET BY MOUTH ONCE DAILY 90 tablet 3   Current Facility-Administered Medications on File Prior to Visit  Medication Dose Route Frequency Provider Last Rate Last Dose  . albuterol (PROVENTIL) (2.5 MG/3ML) 0.083% nebulizer solution 2.5 mg  2.5 mg Nebulization Once Gay Filler Copland, MD        Review of Systems:  As per HPI- otherwise negative.   Physical Examination: Vitals:   11/19/16 1018  BP: 122/84  Pulse: 96  Temp: 98.1 F (36.7 C)   Vitals:   11/19/16 1018  Weight: 214 lb 12.8 oz (97.4 kg)  Height: 5' 6"  (1.676 m)   Body mass index is 34.67 kg/m. Ideal Body Weight: Weight in (lb) to have BMI = 25: 154.6  GEN: WDWN, NAD, Non-toxic, A & O x 3, obese, OW looks well HEENT: Atraumatic, Normocephalic. Neck supple. No masses, No LAD.  Bilateral TM wnl, oropharynx normal.  PEERL,EOMI.   Ears and Nose: No external deformity. CV: RRR, No M/G/R. No JVD. No thrill. No extra heart sounds. PULM: CTA B, no wheezes, crackles, rhonchi. No retractions. No resp. distress. No accessory muscle use. ABD: S, NT, ND, +BS. No rebound. No HSM. EXTR: No c/c/e NEURO Normal gait.  PSYCH: Normally interactive. Conversant. Not depressed or anxious appearing.  Calm demeanor.  Breast: normal exam, no masses/ dimpling/ discharge Pelvic: prolapsed bladder  visible at introitus,  no vaginal lesions or discharge. Uterus normal, no CMT, no adnexal tendereness or masses Normal foot exam done today  Assessment and Plan:  Physical exam  Type 2 diabetes mellitus treated without insulin (HCC) - Plan: Hemoglobin A1c, Comprehensive metabolic panel  Essential hypertension  Hyperlipidemia, unspecified hyperlipidemia type - Plan: Lipid panel  Screening for cervical cancer - Plan: Cytology - PAP  Bladder prolapse, female, acquired  Screening for deficiency anemia - Plan: CBC  Tobacco abuse - Plan: CT CHEST LUNG CA SCREEN LOW DOSE W/O CM  Here today for a CPE Labs and pap pending Will arrange for a low dose screening CT for her   Will plan further follow- up pending labs.  Signed Lamar Blinks, MD  Received her labs so far Results for orders placed or performed in visit on 11/19/16  Hemoglobin A1c  Result Value Ref Range   Hgb A1c MFr Bld 7.7 (H) <5.7 %   Mean Plasma Glucose 174 mg/dL  CBC  Result Value Ref Range   WBC 10.2 4.0 - 10.5 K/uL   RBC 5.25 (H) 3.87 - 5.11 Mil/uL   Platelets 305.0 150.0 - 400.0 K/uL   Hemoglobin 11.7 (L) 12.0 - 15.0 g/dL   HCT 37.1 36.0 - 46.0 %   MCV 70.5 (L) 78.0 - 100.0 fl   MCHC 31.6 30.0 - 36.0 g/dL   RDW 18.2 (H) 11.5 - 15.5 %  Comprehensive metabolic panel  Result Value Ref Range   Sodium 134 (L) 135 - 145 mEq/L   Potassium 3.5 3.5 - 5.1 mEq/L   Chloride 102 96 - 112 mEq/L   CO2 27 19 - 32 mEq/L   Glucose, Bld 208 (H) 70 - 99 mg/dL   BUN 8 6 - 23 mg/dL   Creatinine, Ser 0.52 0.40 - 1.20 mg/dL   Total Bilirubin 0.3 0.2 - 1.2 mg/dL   Alkaline Phosphatase 120 (H) 39 - 117 U/L   AST 22 0 - 37 U/L   ALT 47 (H) 0 - 35 U/L   Total Protein 7.4 6.0 - 8.3 g/dL   Albumin 3.9 3.5 - 5.2 g/dL   Calcium 9.7 8.4 - 10.5 mg/dL   GFR 153.95 >60.00 mL/min  Lipid panel  Result Value Ref Range   Cholesterol 151 0 - 200 mg/dL   Triglycerides 123.0 0.0 - 149.0 mg/dL   HDL 47.20 >39.00 mg/dL   VLDL 24.6  0.0 - 40.0 mg/dL   LDL Cholesterol 79 0 - 99 mg/dL   Total CHOL/HDL Ratio 3    NonHDL 103.49    A1c is indeed better Cholesterol is reasonable MCV is always low- mild anemia, colonoscopy a year ago Alk phos and ALT have shown intermittent mild elevation over the last 3 years, doubt of any consequence.  Negative Hep C screening.  Will obtain abd [place] to look for fatty liver,  Message to pt on mychart

## 2016-11-19 NOTE — Patient Instructions (Addendum)
It was great to see you today- I will be in touch with your labs asap We will see you are doing as far as your lipids and A1c today   Recently, a new recommendation has been made regarding screening for lung cancer using annual "low dose" CT scanning.  This service is recommended for people who are 58- 62 years old, who currently smoke or quit in the last 15 years, and who smoked at least a pack per day for 30 years or more.    Some patients who are at least 62 years old and who smoked a pack per day for 20 years, or were exposed to second hand smoke may also qualify for screening.    In [place] this service is available at [facility], [phone]. The exam costs about $300 but may be covered by insurance.

## 2016-11-20 ENCOUNTER — Ambulatory Visit (HOSPITAL_BASED_OUTPATIENT_CLINIC_OR_DEPARTMENT_OTHER)
Admission: RE | Admit: 2016-11-20 | Discharge: 2016-11-20 | Disposition: A | Discharge status: Home / Self Care | Source: Ambulatory Visit | Attending: Family Medicine | Admitting: Family Medicine

## 2016-11-20 ENCOUNTER — Encounter: Payer: Self-pay | Admitting: Family Medicine

## 2016-11-20 DIAGNOSIS — F17210 Nicotine dependence, cigarettes, uncomplicated: Secondary | ICD-10-CM | POA: Diagnosis present

## 2016-11-20 DIAGNOSIS — Z720 Tobacco use: Secondary | ICD-10-CM

## 2016-11-20 DIAGNOSIS — I700 Atherosclerosis of aorta: Secondary | ICD-10-CM | POA: Diagnosis not present

## 2016-11-20 DIAGNOSIS — J439 Emphysema, unspecified: Secondary | ICD-10-CM | POA: Insufficient documentation

## 2016-11-20 DIAGNOSIS — I2510 Atherosclerotic heart disease of native coronary artery without angina pectoris: Secondary | ICD-10-CM | POA: Insufficient documentation

## 2016-11-20 LAB — CYTOLOGY - PAP
ADEQUACY: ABSENT
DIAGNOSIS: NEGATIVE
HPV: NOT DETECTED
Trichomonas: NEGATIVE

## 2016-11-20 NOTE — Addendum Note (Signed)
Addended by: Lamar Blinks C on: 11/20/2016 06:01 AM   Modules accepted: Orders

## 2016-11-24 ENCOUNTER — Encounter: Payer: Self-pay | Admitting: Family Medicine

## 2016-11-24 ENCOUNTER — Ambulatory Visit (HOSPITAL_BASED_OUTPATIENT_CLINIC_OR_DEPARTMENT_OTHER)
Admission: RE | Admit: 2016-11-24 | Discharge: 2016-11-24 | Disposition: A | Discharge status: Home / Self Care | Source: Ambulatory Visit | Attending: Family Medicine | Admitting: Family Medicine

## 2016-11-24 DIAGNOSIS — R932 Abnormal findings on diagnostic imaging of liver and biliary tract: Secondary | ICD-10-CM | POA: Diagnosis not present

## 2016-11-24 DIAGNOSIS — R7989 Other specified abnormal findings of blood chemistry: Secondary | ICD-10-CM | POA: Insufficient documentation

## 2016-11-24 DIAGNOSIS — Z9049 Acquired absence of other specified parts of digestive tract: Secondary | ICD-10-CM | POA: Insufficient documentation

## 2016-11-24 DIAGNOSIS — K760 Fatty (change of) liver, not elsewhere classified: Secondary | ICD-10-CM | POA: Insufficient documentation

## 2016-11-24 DIAGNOSIS — R945 Abnormal results of liver function studies: Secondary | ICD-10-CM

## 2017-01-13 ENCOUNTER — Other Ambulatory Visit: Payer: Self-pay | Admitting: Family Medicine

## 2017-02-01 ENCOUNTER — Ambulatory Visit (INDEPENDENT_AMBULATORY_CARE_PROVIDER_SITE_OTHER): Admitting: Family Medicine

## 2017-02-01 ENCOUNTER — Ambulatory Visit (HOSPITAL_BASED_OUTPATIENT_CLINIC_OR_DEPARTMENT_OTHER)
Admission: RE | Admit: 2017-02-01 | Discharge: 2017-02-01 | Disposition: A | Discharge status: Home / Self Care | Source: Ambulatory Visit | Attending: Family Medicine | Admitting: Family Medicine

## 2017-02-01 ENCOUNTER — Encounter: Payer: Self-pay | Admitting: Family Medicine

## 2017-02-01 VITALS — BP 142/80 | HR 94 | Temp 98.1°F | Ht 66.0 in | Wt 218.0 lb

## 2017-02-01 DIAGNOSIS — M25811 Other specified joint disorders, right shoulder: Secondary | ICD-10-CM | POA: Diagnosis not present

## 2017-02-01 DIAGNOSIS — M25511 Pain in right shoulder: Secondary | ICD-10-CM | POA: Diagnosis not present

## 2017-02-01 DIAGNOSIS — M4722 Other spondylosis with radiculopathy, cervical region: Secondary | ICD-10-CM | POA: Diagnosis not present

## 2017-02-01 DIAGNOSIS — M5030 Other cervical disc degeneration, unspecified cervical region: Secondary | ICD-10-CM | POA: Insufficient documentation

## 2017-02-01 DIAGNOSIS — M542 Cervicalgia: Secondary | ICD-10-CM | POA: Diagnosis present

## 2017-02-01 MED ORDER — ACETAMINOPHEN-CODEINE #3 300-30 MG PO TABS: 1.0000 | ORAL_TABLET | Freq: Four times a day (QID) | ORAL | 0 refills | Status: DC | PRN

## 2017-02-01 NOTE — Progress Notes (Signed)
[place] at [facility] [address], [place], [place] [address] [phone]  Date:  02/01/2017   Name:  Bailey [name]   DOB:  December 23, 1955   MRN:  [id number]  PCP:  Darreld Mclean, MD    Chief Complaint: Shoulder Pain (c/o right shoulder pain that started last weekend. Pt states that the pain radiates down to elbow. Pt also has some type bite on right forearm that does itch. )   History of Present Illness:  Bailey [name] is a 61 y.o. very pleasant female patient who presents with the following:  Last seen by myself a couple of months ago for her CPE  She is here today with right shoulder and arm pain- she noted it about a week ago Insidious onset, she is not aware of any injury.  She does not do any heavy lifting at her job Never had this in the past The left side is ok Sometimes the pain will radiate all the way to her hand She has used some of her flexeril that helps a bit She is otherwise feeling her normal self.  Her DM has been under ok- not great- control recently.   She does have a history of degenerative spine disease   Lab Results  Component Value Date   HGBA1C 7.7 (H) 11/19/2016     Patient Active Problem List   Diagnosis Date Noted  . Fatty liver 11/24/2016  . GERD (gastroesophageal reflux disease) 07/26/2015  . Hyperlipidemia 07/26/2015  . Dyspepsia 07/26/2015  . Essential hypertension 07/26/2015  . Fibrocystic breast disease 03/22/2015  . Type 2 diabetes mellitus treated without insulin ([place]) 02/16/2013    Past Medical History:  Diagnosis Date  . Arthritis   . Diabetes mellitus without complication ([place])   . Fibrocystic breast disease July 2016  . Hypertension     Past Surgical History:  Procedure Laterality Date  . CHOLECYSTECTOMY    . UTERINE FIBROID SURGERY      Social History  Substance Use Topics  . Smoking status: Current Every Day Smoker    Packs/day: 1.00    Years: 20.00     Types: Cigarettes  . Smokeless tobacco: Never Used     Comment: Tobacco info given 10/11/15  . Alcohol use No    Family History  Problem Relation Age of Onset  . Diabetes Mother   . Stroke Mother   . Dementia Mother   . Heart disease Father   . Diabetes Father   . Diabetes Brother   . Dementia Brother   . Renal Disease Brother   . Diabetes Sister   . Narcolepsy Sister   . Arthritis Sister   . Diabetes Sister   . Obesity Sister   . Diabetes Brother   . Heart disease Brother   . Cancer Brother        pancreatic  . Diabetes Brother   . Diabetes Brother   . Colon cancer Neg Hx   . Esophageal cancer Neg Hx   . Stomach cancer Neg Hx   . Rectal cancer Neg Hx     No Known Allergies  Medication list has been reviewed and updated.  Current Outpatient Prescriptions on File Prior to Visit  Medication Sig Dispense Refill  . acetaminophen (TYLENOL) 500 MG tablet Take 1,000 mg by mouth every 6 (six) hours as needed (pain). Reported on 04/08/2016    . albuterol (PROVENTIL HFA;VENTOLIN HFA) 108 (90 Base) MCG/ACT  inhaler Inhale 2 puffs into the lungs every 6 (six) hours as needed for wheezing or shortness of breath. 1 Inhaler 0  . atorvastatin (LIPITOR) 40 MG tablet TAKE ONE TABLET BY MOUTH ONCE DAILY 90 tablet 3  . blood glucose meter kit and supplies KIT Dispense based on patient and insurance preference. Use up to four times daily as directed. (FOR ICD-9 250.00, 250.01). 1 each 0  . cholestyramine (QUESTRAN) 4 g packet Take 1 packet (4 g total) by mouth 2 (two) times daily. 60 each 3  . cyclobenzaprine (FLEXERIL) 5 MG tablet Take 1 tablet (5 mg total) by mouth at bedtime. Use as needed for neck pain 30 tablet 3  . fluticasone (FLOVENT HFA) 110 MCG/ACT inhaler Inhale 1 puff into the lungs 2 (two) times daily. 1 Inhaler 12  . glimepiride (AMARYL) 4 MG tablet TAKE TWO TABLETS BY MOUTH ONCE DAILY WITH  BREAKFAST 180 tablet 3  . glucose blood test strip Test blood sugar daily. Dx code:  E11.8. 100 each 3  . hydrochlorothiazide (MICROZIDE) 12.5 MG capsule TAKE ONE CAPSULE BY MOUTH ONCE DAILY 90 capsule 3  . Lancets MISC Pt uses One Touch.  Test 1 or 2 times day 100 each 1  . lisinopril (PRINIVIL,ZESTRIL) 20 MG tablet Take 1 tablet (20 mg total) by mouth daily. 90 tablet 3  . meloxicam (MOBIC) 15 MG tablet Take 1 tablet (15 mg total) by mouth daily. Use as needed for knee and back pain 30 tablet 2  . metFORMIN (GLUCOPHAGE-XR) 500 MG 24 hr tablet TAKE TWO TABLETS BY MOUTH TWICE DAILY WITH A MEAL 360 tablet 0  . pantoprazole (PROTONIX) 40 MG tablet TAKE ONE TABLET BY MOUTH ONCE DAILY 90 tablet 3   Current Facility-Administered Medications on File Prior to Visit  Medication Dose Route Frequency Provider Last Rate Last Dose  . albuterol (PROVENTIL) (2.5 MG/3ML) 0.083% nebulizer solution 2.5 mg  2.5 mg Nebulization Once Manasa Spease, Gay Filler, MD        Review of Systems:  As per HPI- otherwise negative.   Physical Examination: Vitals:   02/01/17 1105  BP: (!) 142/80  Pulse: 94  Temp: 98.1 F (36.7 C)   Vitals:   02/01/17 1105  Weight: 218 lb (98.9 kg)  Height: 5' 6"  (1.676 m)   Body mass index is 35.19 kg/m. Ideal Body Weight: Weight in (lb) to have BMI = 25: 154.6  GEN: WDWN, NAD, Non-toxic, A & O x 3, obese, otherwise looks well HEENT: Atraumatic, Normocephalic. Neck supple. No masses, No LAD.  Bilateral TM wnl, oropharynx normal.  PEERL,EOMI.   Ears and Nose: No external deformity. CV: RRR, No M/G/R. No JVD. No thrill. No extra heart sounds. PULM: CTA B, no wheezes, crackles, rhonchi. No retractions. No resp. distress. No accessory muscle use. EXTR: No c/c/e NEURO Normal gait.  PSYCH: Normally interactive. Conversant. Not depressed or anxious appearing.  Calm demeanor.  She has fairly normal ROM and exam of her shoulder today.  Normal strength of BUE , negative empty can, normal biceps DTR bilaterally She notes some discomfort with ROM of her neck.    Films of  neck and right shoulder today   Dg Cervical Spine Complete  Result Date: 02/01/2017 CLINICAL DATA:  Intermittent right-sided neck pain and tenderness radiating into the right shoulder. No specific injury. EXAM: CERVICAL SPINE - COMPLETE 4+ VIEW COMPARISON:  Cervical spine AP and lateral views of July 22, 2013. FINDINGS: The cervical vertebral bodies are preserved in height. There  is mild disc space narrowing at C5-6 and at C6-7. There are anterior endplate osteophytes from C3-4 through C6-7. There is no spondylolisthesis. There is no perched facet or spinous process fracture. The oblique views reveal no high-grade bony encroachment upon the neural foramina. The odontoid appears intact. There is mild degenerative narrowing of the C1-C2 lateral mass articulation especially on the left. IMPRESSION: There is moderate degenerative disc disease as described. There is no significant bony encroachment upon the neural foramina. Given the patient's radicular symptoms, cervical spine MRI would be a useful next imaging step. Electronically Signed   By: David  Martinique M.D.   On: 02/01/2017 11:38   Dg Shoulder Right  Result Date: 02/01/2017 CLINICAL DATA:  Intermittent right-sided neck pain and tenderness radiating into the right shoulder without definite injury to the shoulder. Duration of symptoms is been 6 months. Symptoms are worsening. EXAM: RIGHT SHOULDER - 2+ VIEW COMPARISON:  None in PACs FINDINGS: The bones of the right shoulder are subjectively adequately mineralized. There is no acute or healing fracture. The glenohumeral joint space appears reasonably well-maintained. There is calcification in the region of the insertion of the rotator cuff on the greater tuberosity of the humerus. There is mild degenerative narrowing of the AC joint. There is no subacromial spur. The observed portions of the right clavicle and upper right ribs are normal. IMPRESSION: There is no acute bony abnormality of the right shoulder.  Mild AC joint degenerative change is noted. There is calcification in the region of the insertion of the supraspinatus tendon on the greater tuberosity of the humerus which is consistent with calcific tendinitis. Electronically Signed   By: David  Martinique M.D.   On: 02/01/2017 11:59   Assessment and Plan: Osteoarthritis of spine with radiculopathy, cervical region - Plan: acetaminophen-codeine (TYLENOL #3) 300-30 MG tablet  Acute pain of right shoulder - Plan: DG Cervical Spine Complete, DG Shoulder Right  Here today to evaluate shoulder pain and pain that radiates down her arm.  Suspect involvement of her neck- she has a known history of spine disease and symptoms running down her right arm For the time being would prefer to avoid steroids due to her DM She also would like to hold off on an MRI due to expense Did give her tylenol #3 to use as needed - she will let me know how she is feeling in about one week  She may use tylenol #3 OR flexeril as needed for pain- she is instructed not to use both together and states understanding   Signed Lamar Blinks, MD

## 2017-02-01 NOTE — Patient Instructions (Addendum)
It was good to see you today- please keep me posted as to how your neck and shoulder are feeling.  If you are not doing better in about one week please let me know- Sooner if worse.   Use the flexeril or the tylenol #3 as needed for pain.

## 2017-02-02 ENCOUNTER — Other Ambulatory Visit: Payer: Self-pay | Admitting: Family Medicine

## 2017-02-02 DIAGNOSIS — M25561 Pain in right knee: Secondary | ICD-10-CM

## 2017-02-02 DIAGNOSIS — M546 Pain in thoracic spine: Secondary | ICD-10-CM

## 2017-02-02 NOTE — Telephone Encounter (Signed)
Received refill request for Meloxicam (MOBIC) 15 MG tablet. Last office visit 02/01/17 and last refill 01/20/16. Please advise.

## 2017-02-09 ENCOUNTER — Encounter: Payer: Self-pay | Admitting: Family Medicine

## 2017-02-09 DIAGNOSIS — M25511 Pain in right shoulder: Secondary | ICD-10-CM

## 2017-02-11 NOTE — Addendum Note (Signed)
Addended by: Lamar Blinks C on: 02/11/2017 11:25 AM   Modules accepted: Orders

## 2017-02-12 LAB — HM DIABETES EYE EXAM

## 2017-02-20 ENCOUNTER — Encounter: Payer: Self-pay | Admitting: Family Medicine

## 2017-02-20 DIAGNOSIS — E11319 Type 2 diabetes mellitus with unspecified diabetic retinopathy without macular edema: Secondary | ICD-10-CM | POA: Insufficient documentation

## 2017-04-25 ENCOUNTER — Other Ambulatory Visit: Payer: Self-pay | Admitting: Family Medicine

## 2017-04-26 NOTE — Telephone Encounter (Addendum)
error:[id number] ° °

## 2017-05-06 ENCOUNTER — Ambulatory Visit: Admitting: Family Medicine

## 2017-05-11 NOTE — Progress Notes (Addendum)
[place] at [facility] [address], [place], [place] [address] [phone]  Date:  05/13/2017   Name:  Bailey [name]   DOB:  01/17/1956   MRN:  [id number]  PCP:  Darreld Mclean, MD    Chief Complaint: Follow-up; Anemia (Pt states she's always cold and has a Hx of anemia ); and Diabetes (States she's trying to get her A1C "under control" and lose weight)   History of Present Illness:  Bailey [name] is a 61 y.o. very pleasant female patient who presents with the following:  Here today for a follow-up visit History of HTN, hyperlipidemia, DM, smoker I saw her in March for a CPE and in May for back pain She is on amaryl and metformin 1000 BID for her DM At home her glucose has been "running high," she is not sure how her A1c will look today She is trying to watch her diet- eating more veggies She notes that she feels cold a lot of the time - last thyroid in 2016, history of iron deficiency Last mammogram: per pt it was last year, she will have this done again soon  Lab Results  Component Value Date   HGBA1C 7.7 (H) 11/19/2016   Last cholesterol panel in March of this year She has lost a few lbs recently - she is working on this [employer] from Last 3 Encounters:  05/13/17 212 lb (96.2 kg)  02/01/17 218 lb (98.9 kg)  11/19/16 214 lb 12.8 oz (97.4 kg)   BP Readings from Last 3 Encounters:  05/13/17 (!) 158/74  02/01/17 (!) 142/80  11/19/16 122/84   She continues to work on quitting smoking- she has not been successful yet  She no longer needs to use any of her inhaled medications    Patient Active Problem List   Diagnosis Date Noted  . Diabetic retinopathy ([place]) 02/20/2017  . Fatty liver 11/24/2016  . GERD (gastroesophageal reflux disease) 07/26/2015  . Hyperlipidemia 07/26/2015  . Dyspepsia 07/26/2015  . Essential hypertension 07/26/2015  . Fibrocystic breast disease 03/22/2015  . Type 2 diabetes  mellitus treated without insulin ([place]) 02/16/2013    Past Medical History:  Diagnosis Date  . Arthritis   . Diabetes mellitus without complication ([place])   . Fibrocystic breast disease July 2016  . Hypertension     Past Surgical History:  Procedure Laterality Date  . CHOLECYSTECTOMY    . UTERINE FIBROID SURGERY      Social History  Substance Use Topics  . Smoking status: Current Every Day Smoker    Packs/day: 1.00    Years: 20.00    Types: Cigarettes  . Smokeless tobacco: Never Used     Comment: Tobacco info given 10/11/15  . Alcohol use No    Family History  Problem Relation Age of Onset  . Diabetes Mother   . Stroke Mother   . Dementia Mother   . Heart disease Father   . Diabetes Father   . Diabetes Brother   . Dementia Brother   . Renal Disease Brother   . Diabetes Sister   . Narcolepsy Sister   . Arthritis Sister   . Diabetes Sister   . Obesity Sister   . Diabetes Brother   . Heart disease Brother   . Cancer Brother        pancreatic  . Diabetes Brother   . Diabetes Brother   . Colon cancer Neg Hx   .  Esophageal cancer Neg Hx   . Stomach cancer Neg Hx   . Rectal cancer Neg Hx     No Known Allergies  Medication list has been reviewed and updated.  Current Outpatient Prescriptions on File Prior to Visit  Medication Sig Dispense Refill  . acetaminophen (TYLENOL) 500 MG tablet Take 1,000 mg by mouth every 6 (six) hours as needed (pain). Reported on 04/08/2016    . acetaminophen-codeine (TYLENOL #3) 300-30 MG tablet Take 1-2 tablets by mouth every 6 (six) hours as needed for moderate pain. 20 tablet 0  . albuterol (PROVENTIL HFA;VENTOLIN HFA) 108 (90 Base) MCG/ACT inhaler Inhale 2 puffs into the lungs every 6 (six) hours as needed for wheezing or shortness of breath. 1 Inhaler 0  . atorvastatin (LIPITOR) 40 MG tablet TAKE ONE TABLET BY MOUTH ONCE DAILY 90 tablet 3  . blood glucose meter kit and supplies KIT Dispense based on patient and insurance  preference. Use up to four times daily as directed. (FOR ICD-9 250.00, 250.01). 1 each 0  . cholestyramine (QUESTRAN) 4 g packet Take 1 packet (4 g total) by mouth 2 (two) times daily. 60 each 3  . cyclobenzaprine (FLEXERIL) 5 MG tablet Take 1 tablet (5 mg total) by mouth at bedtime. Use as needed for neck pain 30 tablet 3  . fluticasone (FLOVENT HFA) 110 MCG/ACT inhaler Inhale 1 puff into the lungs 2 (two) times daily. 1 Inhaler 12  . glimepiride (AMARYL) 4 MG tablet TAKE TWO TABLETS BY MOUTH ONCE DAILY WITH  BREAKFAST 180 tablet 3  . glucose blood test strip Test blood sugar daily. Dx code: E11.8. 100 each 3  . hydrochlorothiazide (MICROZIDE) 12.5 MG capsule TAKE ONE CAPSULE BY MOUTH ONCE DAILY 90 capsule 3  . Lancets MISC Pt uses One Touch.  Test 1 or 2 times day 100 each 1  . lisinopril (PRINIVIL,ZESTRIL) 20 MG tablet Take 1 tablet (20 mg total) by mouth daily. 90 tablet 3  . meloxicam (MOBIC) 15 MG tablet TAKE ONE TABLET BY MOUTH DAILY.   USE  AS  NEEDED  FOR  KNEE  AND  BACK  PAIN 30 tablet 2  . metFORMIN (GLUCOPHAGE-XR) 500 MG 24 hr tablet TAKE 2 TABLETS BY MOUTH TWICE DAILY WITH A MEAL. 360 tablet 0  . pantoprazole (PROTONIX) 40 MG tablet TAKE ONE TABLET BY MOUTH ONCE DAILY 90 tablet 3   Current Facility-Administered Medications on File Prior to Visit  Medication Dose Route Frequency Provider Last Rate Last Dose  . albuterol (PROVENTIL) (2.5 MG/3ML) 0.083% nebulizer solution 2.5 mg  2.5 mg Nebulization Once [place]land, Gay Filler, MD        Review of Systems: No fever or chills No CP or SOB No nausea, vomiting or diarrhea No skin rash  As per HPI- otherwise negative.   Physical Examination: Vitals:   05/13/17 0859  BP: (!) 158/74  Pulse: 85  Temp: 98.4 F (36.9 C)  SpO2: 98%   Vitals:   05/13/17 0859  Weight: 212 lb (96.2 kg)  Height: 5' 6"  (1.676 m)   Body mass index is 34.22 kg/m. Ideal Body Weight: Weight in (lb) to have BMI = 25: 154.6  GEN: WDWN, NAD, Non-toxic, A  & O x 3, obese, looks well HEENT: Atraumatic, Normocephalic. Neck supple. No masses, No LAD. Ears and Nose: No external deformity. CV: RRR, No M/G/R. No JVD. No thrill. No extra heart sounds. PULM: CTA B, no wheezes, crackles, rhonchi. No retractions. No resp. distress. No accessory muscle  use. ABD: S, NT, ND, +BS. No rebound. No HSM. EXTR: No c/c/e NEURO Normal gait.  PSYCH: Normally interactive. Conversant. Not depressed or anxious appearing.  Calm demeanor.    Assessment and Plan: Controlled type 2 diabetes mellitus without complication, without long-term current use of insulin ([place]) - Plan: Hemoglobin E0E, Basic metabolic panel  Essential hypertension  Hyperlipidemia, unspecified hyperlipidemia type  Chronic fatigue - Plan: Ferritin, TSH  Cold intolerance - Plan: TSH  History of anemia - Plan: Ferritin  Recheck of DM today- will obtain A1c to check her progress Continue to encourage her to quit smoking See patient instructions for more details.   Will plan further follow- up pending labs.   Signed Lamar Blinks, MD  Received her labs 8/24- mychart message to pt  Results for orders placed or performed in visit on 23/36/12  Basic metabolic panel  Result Value Ref Range   Sodium 136 135 - 145 mEq/L   Potassium 4.4 3.5 - 5.1 mEq/L   Chloride 102 96 - 112 mEq/L   CO2 25 19 - 32 mEq/L   Glucose, Bld 193 (H) 70 - 99 mg/dL   BUN 9 6 - 23 mg/dL   Creatinine, Ser 0.61 0.40 - 1.20 mg/dL   Calcium 10.3 8.4 - 10.5 mg/dL   GFR 127.85 >60.00 mL/min  Ferritin  Result Value Ref Range   Ferritin 40.8 10.0 - 291.0 ng/mL  TSH  Result Value Ref Range   TSH 1.03 0.35 - 4.50 uIU/mL   . Lab Results  Component Value Date   HGBA1C 8.5 (H) 05/13/2017

## 2017-05-13 ENCOUNTER — Ambulatory Visit (INDEPENDENT_AMBULATORY_CARE_PROVIDER_SITE_OTHER): Admitting: Family Medicine

## 2017-05-13 ENCOUNTER — Other Ambulatory Visit

## 2017-05-13 ENCOUNTER — Encounter: Payer: Self-pay | Admitting: Family Medicine

## 2017-05-13 VITALS — BP 138/82 | HR 85 | Temp 98.4°F | Ht 66.0 in | Wt 212.0 lb

## 2017-05-13 DIAGNOSIS — R6889 Other general symptoms and signs: Secondary | ICD-10-CM | POA: Diagnosis not present

## 2017-05-13 DIAGNOSIS — Z862 Personal history of diseases of the blood and blood-forming organs and certain disorders involving the immune mechanism: Secondary | ICD-10-CM | POA: Diagnosis not present

## 2017-05-13 DIAGNOSIS — R5382 Chronic fatigue, unspecified: Secondary | ICD-10-CM

## 2017-05-13 DIAGNOSIS — E785 Hyperlipidemia, unspecified: Secondary | ICD-10-CM | POA: Diagnosis not present

## 2017-05-13 DIAGNOSIS — E119 Type 2 diabetes mellitus without complications: Secondary | ICD-10-CM

## 2017-05-13 DIAGNOSIS — I10 Essential (primary) hypertension: Secondary | ICD-10-CM | POA: Diagnosis not present

## 2017-05-13 LAB — BASIC METABOLIC PANEL
BUN: 9 mg/dL (ref 6–23)
CALCIUM: 10.3 mg/dL (ref 8.4–10.5)
CO2: 25 meq/L (ref 19–32)
Chloride: 102 mEq/L (ref 96–112)
Creatinine, Ser: 0.61 mg/dL (ref 0.40–1.20)
GFR: 127.85 mL/min (ref >60.00)
GLUCOSE: 193 mg/dL — AB (ref 70–99)
POTASSIUM: 4.4 meq/L (ref 3.5–5.1)
SODIUM: 136 meq/L (ref 135–145)

## 2017-05-13 LAB — TSH: TSH: 1.03 u[IU]/mL (ref 0.35–4.50)

## 2017-05-13 LAB — FERRITIN: Ferritin: 40.8 ng/mL (ref 10.0–291.0)

## 2017-05-13 NOTE — Patient Instructions (Signed)
It was very nice to see you again today!   Continue to work on exercise and quitting smoking- congrats on weight loss Please ask your mammography office to send me a copy of your next report I will be in touch with your labs asap  We can plan to visit in 6 months or so unless your labs suggest otherwise

## 2017-05-14 ENCOUNTER — Encounter: Payer: Self-pay | Admitting: Family Medicine

## 2017-05-14 LAB — HEMOGLOBIN A1C
Hgb A1c MFr Bld: 8.5 % — ABNORMAL HIGH
Mean Plasma Glucose: 197 mg/dL

## 2017-06-28 ENCOUNTER — Other Ambulatory Visit: Payer: Self-pay | Admitting: Family Medicine

## 2017-06-28 DIAGNOSIS — Z1231 Encounter for screening mammogram for malignant neoplasm of breast: Secondary | ICD-10-CM

## 2017-06-29 ENCOUNTER — Other Ambulatory Visit: Payer: Self-pay | Admitting: Family Medicine

## 2017-06-29 DIAGNOSIS — D241 Benign neoplasm of right breast: Secondary | ICD-10-CM

## 2017-07-01 ENCOUNTER — Ambulatory Visit
Admission: RE | Admit: 2017-07-01 | Discharge: 2017-07-01 | Disposition: A | Discharge status: Home / Self Care | Source: Ambulatory Visit | Attending: Family Medicine | Admitting: Family Medicine

## 2017-07-01 DIAGNOSIS — D241 Benign neoplasm of right breast: Secondary | ICD-10-CM

## 2017-07-31 ENCOUNTER — Other Ambulatory Visit: Payer: Self-pay | Admitting: Family Medicine

## 2017-07-31 DIAGNOSIS — I10 Essential (primary) hypertension: Secondary | ICD-10-CM

## 2017-07-31 DIAGNOSIS — E119 Type 2 diabetes mellitus without complications: Secondary | ICD-10-CM

## 2017-07-31 DIAGNOSIS — R1013 Epigastric pain: Secondary | ICD-10-CM

## 2017-09-05 NOTE — Progress Notes (Addendum)
[place] at [facility] [address] [place], [place] [address] [phone]  Date:  09/06/2017   Name:  Bailey [name]   DOB:  02-Mar-1956   MRN:  [id number]  PCP:  Darreld Mclean, MD    Chief Complaint: No chief complaint on file.   History of Present Illness:  Bailey [name] is a 61 y.o. very pleasant female patient who presents with the following:  History of DM, retinopathy, HTN, hyperlipidemia Here today for a follow-up visit Last seen here in August:  She is on amaryl and metformin 1000 BID for her DM At home her glucose has been "running high," she is not sure how her A1c will look today She is trying to watch her diet- eating more veggies She notes that she feels cold a lot of the time - last thyroid in 2016, history of iron deficiency  Lab Results  Component Value Date   HGBA1C 8.5 (H) 05/13/2017   At last visit we did not change her medication- she did agree to work on diet and exercise, so we hope we will see improvement today She notes that she has been working hard on her diet She has been checking her sugar some, not daily. However she is still running about 300 when she will check it, so we suspect that her A1c will still be high   BP Readings from Last 3 Encounters:  09/06/17 (!) 117/58  05/13/17 138/82  02/01/17 (!) 142/80   Wt Readings from Last 3 Encounters:  09/06/17 216 lb 9.6 oz (98.2 kg)  05/13/17 212 lb (96.2 kg)  02/01/17 218 lb (98.9 kg)   Flu: done today She was seen at [facility] ortho for her right arm/ elbow pain- however it continues to bother her. She did do some PT Naproxen does help her- she generally takes this just at night   She does use mobic on occasion as well, but does not use both NSAIDs together - she will alternate  Her BP is a bit low- she does not have cuff but would be able to get one to monitor her BP at home  She had a right cataract removed by [facility] in the  fall- she notes that her right lid seems to be a bit lower than the left since then.    Patient Active Problem List   Diagnosis Date Noted  . Diabetic retinopathy ([place]) 02/20/2017  . Fatty liver 11/24/2016  . GERD (gastroesophageal reflux disease) 07/26/2015  . Hyperlipidemia 07/26/2015  . Dyspepsia 07/26/2015  . Essential hypertension 07/26/2015  . Fibrocystic breast disease 03/22/2015  . Type 2 diabetes mellitus treated without insulin ([place]) 02/16/2013    Past Medical History:  Diagnosis Date  . Arthritis   . Diabetes mellitus without complication ([place])   . Fibrocystic breast disease July 2016  . Hypertension     Past Surgical History:  Procedure Laterality Date  . BREAST BIOPSY Right 04/19/2015   benign  . CHOLECYSTECTOMY    . UTERINE FIBROID SURGERY      Social History   Tobacco Use  . Smoking status: Current Every Day Smoker    Packs/day: 1.00    Years: 20.00    Pack years: 20.00    Types: Cigarettes  . Smokeless tobacco: Never Used  . Tobacco comment: Tobacco info given 10/11/15  Substance Use Topics  . Alcohol use: No    Alcohol/week: 0.0 oz  . Drug  use: No    Family History  Problem Relation Age of Onset  . Diabetes Mother   . Stroke Mother   . Dementia Mother   . Heart disease Father   . Diabetes Father   . Diabetes Brother   . Dementia Brother   . Renal Disease Brother   . Diabetes Sister   . Narcolepsy Sister   . Arthritis Sister   . Diabetes Sister   . Obesity Sister   . Diabetes Brother   . Heart disease Brother   . Cancer Brother        pancreatic  . Diabetes Brother   . Diabetes Brother   . Colon cancer Neg Hx   . Esophageal cancer Neg Hx   . Stomach cancer Neg Hx   . Rectal cancer Neg Hx     No Known Allergies  Medication list has been reviewed and updated.  Current Outpatient Medications on File Prior to Visit  Medication Sig Dispense Refill  . acetaminophen (TYLENOL) 500 MG tablet Take 1,000 mg by mouth every 6 (six)  hours as needed (pain). Reported on 04/08/2016    . acetaminophen-codeine (TYLENOL #3) 300-30 MG tablet Take 1-2 tablets by mouth every 6 (six) hours as needed for moderate pain. 20 tablet 0  . atorvastatin (LIPITOR) 40 MG tablet TAKE ONE TABLET BY MOUTH ONCE DAILY 90 tablet 3  . blood glucose meter kit and supplies KIT Dispense based on patient and insurance preference. Use up to four times daily as directed. (FOR ICD-9 250.00, 250.01). 1 each 0  . cholestyramine (QUESTRAN) 4 g packet Take 1 packet (4 g total) by mouth 2 (two) times daily. 60 each 3  . cyclobenzaprine (FLEXERIL) 5 MG tablet Take 1 tablet (5 mg total) by mouth at bedtime. Use as needed for neck pain 30 tablet 3  . glimepiride (AMARYL) 4 MG tablet TAKE TWO TABLETS BY MOUTH ONCE DAILY WITH BREAKFAST 180 tablet 3  . glucose blood test strip Test blood sugar daily. Dx code: E11.8. 100 each 3  . hydrochlorothiazide (MICROZIDE) 12.5 MG capsule TAKE ONE CAPSULE BY MOUTH ONCE DAILY 90 capsule 3  . Lancets MISC Pt uses One Touch.  Test 1 or 2 times day 100 each 1  . lisinopril (PRINIVIL,ZESTRIL) 20 MG tablet TAKE ONE TABLET BY MOUTH ONCE DAILY 90 tablet 3  . meloxicam (MOBIC) 15 MG tablet TAKE ONE TABLET BY MOUTH DAILY.   USE  AS  NEEDED  FOR  KNEE  AND  BACK  PAIN 30 tablet 2  . metFORMIN (GLUCOPHAGE-XR) 500 MG 24 hr tablet TAKE 2 TABLETS BY MOUTH TWICE DAILY WITH A MEAL 360 tablet 0  . naproxen (NAPROSYN) 500 MG tablet Take 500 mg by mouth 2 (two) times daily with a meal.    . pantoprazole (PROTONIX) 40 MG tablet TAKE ONE TABLET BY MOUTH ONCE DAILY 90 tablet 3   No current facility-administered medications on file prior to visit.     Review of Systems:  As per HPI- otherwise negative. No fever or chills No CP or SOB  Physical Examination: Vitals:   09/06/17 0907  BP: (!) 117/58  Pulse: 92  Resp: 14  Temp: 98.5 F (36.9 C)  SpO2: 100%   Vitals:   09/06/17 0907  Weight: 216 lb 9.6 oz (98.2 kg)   Body mass index is 34.96  kg/m. Ideal Body Weight:    GEN: WDWN, NAD, Non-toxic, A & O x 3, obese, looks well HEENT: Atraumatic, Normocephalic. Neck  supple. No masses, No LAD. Ears and Nose: No external deformity. CV: RRR, No M/G/R. No JVD. No thrill. No extra heart sounds. PULM: CTA B, no wheezes, crackles, rhonchi. No retractions. No resp. distress. No accessory muscle use. EXTR: No c/c/e NEURO Normal gait.  PSYCH: Normally interactive. Conversant. Not depressed or anxious appearing.  Calm demeanor.    Assessment and Plan: Controlled type 2 diabetes mellitus without complication, without long-term current use of insulin ([place]) - Plan: Basic metabolic panel, Hemoglobin A1c  Essential hypertension - Plan: CBC  Hyperlipidemia, unspecified hyperlipidemia type  Needs flu shot - Plan: Flu Vaccine QUAD 6+ mos PF IM (Fluarix Quad PF)  Elbow strain, right, sequela - Plan: naproxen (NAPROSYN) 500 MG tablet  Here today with a few concerns Labs pending as above today Flu shot given Naprosyn refilled She continues to notice pain in her right elbow- I had referred her to [facility] and she did PT with them in the past.  She is encouraged to follow-up with them as needed BP is a bit low today- she will monitor this for [place]  See patient instructions for more details.      Signed Lamar Blinks, MD  Received her labs 12/18 Results for orders placed or performed in visit on 50/51/83  Basic metabolic panel  Result Value Ref Range   Sodium 136 135 - 145 mEq/L   Potassium 3.8 3.5 - 5.1 mEq/L   Chloride 101 96 - 112 mEq/L   CO2 28 19 - 32 mEq/L   Glucose, Bld 156 (H) 70 - 99 mg/dL   BUN 8 6 - 23 mg/dL   Creatinine, Ser 0.59 0.40 - 1.20 mg/dL   Calcium 9.7 8.4 - 10.5 mg/dL   GFR 132.73 >60.00 mL/min  CBC  Result Value Ref Range   WBC 9.8 4.0 - 10.5 K/uL   RBC 5.23 (H) 3.87 - 5.11 Mil/uL   Platelets 322.0 150.0 - 400.0 K/uL   Hemoglobin 11.7 (L) 12.0 - 15.0 g/dL   HCT 37.6 36.0 - 46.0 %   MCV 71.8 (L) 78.0  - 100.0 fl   MCHC 31.1 30.0 - 36.0 g/dL   RDW 17.4 (H) 11.5 - 15.5 %   Lab Results  Component Value Date   HGBA1C 8.5 (H) 09/06/2017    Pt with history of low MCV and normal ferritin Normal colon 2/17  Message to pt: Metabolic profile is fine except your blood sugar is a bit high. Also, your A1c (average blood sugar) is too high.  As you are already on a good dose of metformin and also amaryl, I would suggest that we try adding a medication in the SLGT2 inhibitor class.  These medications cause you to urinate out glucose, which assists in lowering your blood sugar and can also help with weight loss.  Would it be ok with you if I add this medication to your regimen?  You can reply to me right here.  Your blood count is ok- you are slightly anemic, but not enough to cause any symptoms.  We will plan to recheck this at your next visit  Please plan to see me in 3-4 months

## 2017-09-06 ENCOUNTER — Ambulatory Visit (INDEPENDENT_AMBULATORY_CARE_PROVIDER_SITE_OTHER): Admitting: Family Medicine

## 2017-09-06 ENCOUNTER — Other Ambulatory Visit

## 2017-09-06 ENCOUNTER — Encounter: Payer: Self-pay | Admitting: Family Medicine

## 2017-09-06 VITALS — BP 117/58 | HR 92 | Temp 98.5°F | Resp 14 | Wt 216.6 lb

## 2017-09-06 DIAGNOSIS — R7301 Impaired fasting glucose: Secondary | ICD-10-CM

## 2017-09-06 DIAGNOSIS — I10 Essential (primary) hypertension: Secondary | ICD-10-CM

## 2017-09-06 DIAGNOSIS — Z23 Encounter for immunization: Secondary | ICD-10-CM | POA: Diagnosis not present

## 2017-09-06 DIAGNOSIS — S46911S Strain of unspecified muscle, fascia and tendon at shoulder and upper arm level, right arm, sequela: Secondary | ICD-10-CM

## 2017-09-06 DIAGNOSIS — S56911S Strain of unspecified muscles, fascia and tendons at forearm level, right arm, sequela: Secondary | ICD-10-CM | POA: Diagnosis not present

## 2017-09-06 DIAGNOSIS — E785 Hyperlipidemia, unspecified: Secondary | ICD-10-CM | POA: Diagnosis not present

## 2017-09-06 DIAGNOSIS — E119 Type 2 diabetes mellitus without complications: Secondary | ICD-10-CM

## 2017-09-06 LAB — CBC
HEMATOCRIT: 37.6 % (ref 36.0–46.0)
Hemoglobin: 11.7 g/dL — ABNORMAL LOW (ref 12.0–15.0)
MCHC: 31.1 g/dL (ref 30.0–36.0)
MCV: 71.8 fl — AB (ref 78.0–100.0)
Platelets: 322 10*3/uL (ref 150.0–400.0)
RBC: 5.23 Mil/uL — ABNORMAL HIGH (ref 3.87–5.11)
RDW: 17.4 % — AB (ref 11.5–15.5)
WBC: 9.8 10*3/uL (ref 4.0–10.5)

## 2017-09-06 LAB — BASIC METABOLIC PANEL
BUN: 8 mg/dL (ref 6–23)
CHLORIDE: 101 meq/L (ref 96–112)
CO2: 28 mEq/L (ref 19–32)
CREATININE: 0.59 mg/dL (ref 0.40–1.20)
Calcium: 9.7 mg/dL (ref 8.4–10.5)
GFR: 132.73 mL/min (ref >60.00)
GLUCOSE: 156 mg/dL — AB (ref 70–99)
Potassium: 3.8 mEq/L (ref 3.5–5.1)
Sodium: 136 mEq/L (ref 135–145)

## 2017-09-06 MED ORDER — NAPROXEN 500 MG PO TABS: 500.0000 mg | ORAL_TABLET | Freq: Two times a day (BID) | ORAL | 5 refills | Status: DC

## 2017-09-06 NOTE — Patient Instructions (Signed)
It was nice to see you again today!  I will be in touch with your labs asap Your BP is a bit low today, but it is not usually low Please check your BP a few times over the next month- I would like you to be 120- 135/ 70- 85.  If you are running higher or lower than this goal on a regular basis please do let me know I will let you know how your A1c looks.  If still high we will alter your medication regimen  Please see me in about 3-4 months and have a wonderful holiday!    Certainly don't hesitate to contact [facility] to follow-up about your elbow pain

## 2017-09-07 ENCOUNTER — Encounter: Payer: Self-pay | Admitting: Family Medicine

## 2017-09-07 LAB — HEMOGLOBIN A1C
EAG (MMOL/L): 10.9 (calc)
Hgb A1c MFr Bld: 8.5 % of total Hgb — ABNORMAL HIGH (ref <5.7)
Mean Plasma Glucose: 197 (calc)

## 2017-11-07 ENCOUNTER — Other Ambulatory Visit: Payer: Self-pay | Admitting: Family Medicine

## 2017-11-07 DIAGNOSIS — I10 Essential (primary) hypertension: Secondary | ICD-10-CM

## 2017-11-07 DIAGNOSIS — E785 Hyperlipidemia, unspecified: Secondary | ICD-10-CM

## 2017-11-12 ENCOUNTER — Encounter: Payer: Self-pay | Admitting: Nurse Practitioner

## 2017-11-12 ENCOUNTER — Ambulatory Visit (INDEPENDENT_AMBULATORY_CARE_PROVIDER_SITE_OTHER): Admitting: Nurse Practitioner

## 2017-11-12 VITALS — BP 168/74 | HR 102 | Temp 98.7°F | Ht 66.0 in | Wt 213.0 lb

## 2017-11-12 DIAGNOSIS — J0100 Acute maxillary sinusitis, unspecified: Secondary | ICD-10-CM

## 2017-11-12 MED ORDER — SALINE SPRAY 0.65 % NA SOLN
1.0000 | NASAL | 0 refills | Status: DC | PRN
Start: 2017-11-12 — End: 2018-08-03

## 2017-11-12 MED ORDER — FLUTICASONE PROPIONATE 50 MCG/ACT NA SUSP
2.0000 | Freq: Every day | NASAL | 0 refills | Status: DC
Start: 2017-11-12 — End: 2021-04-01

## 2017-11-12 MED ORDER — AMOXICILLIN-POT CLAVULANATE 875-125 MG PO TABS: 1.0000 | ORAL_TABLET | Freq: Two times a day (BID) | ORAL | 0 refills | Status: DC

## 2017-11-12 MED ORDER — GUAIFENESIN ER 600 MG PO TB12: 600.0000 mg | ORAL_TABLET | Freq: Two times a day (BID) | ORAL | 0 refills | Status: DC | PRN

## 2017-11-12 MED ORDER — OXYMETAZOLINE HCL 0.05 % NA SOLN: 1.0000 | Freq: Two times a day (BID) | NASAL | 0 refills | Status: DC

## 2017-11-12 NOTE — Progress Notes (Signed)
Subjective:  Patient ID: Bailey [name], female    DOB: [birth year]  Age: 62 y.o. MRN: [id number]  CC: Sinusitis (sinus pressure,sore throat,eyes swelling--1 wk/eye painful,drainage--see blood at times--1 day)  Sinusitis  This is a new problem. The current episode started in the past 7 days. The problem has been gradually worsening since onset. Associated symptoms include chills, congestion, coughing, ear pain, headaches, a hoarse voice, shortness of breath, sinus pressure, sneezing, a sore throat and swollen glands. Past treatments include oral decongestants. The treatment provided no relief.    Outpatient Medications Prior to Visit  Medication Sig Dispense Refill  . acetaminophen (TYLENOL) 500 MG tablet Take 1,000 mg by mouth every 6 (six) hours as needed (pain). Reported on 04/08/2016    . acetaminophen-codeine (TYLENOL #3) 300-30 MG tablet Take 1-2 tablets by mouth every 6 (six) hours as needed for moderate pain. 20 tablet 0  . atorvastatin (LIPITOR) 40 MG tablet TAKE ONE TABLET BY MOUTH ONCE DAILY 90 tablet 3  . blood glucose meter kit and supplies KIT Dispense based on patient and insurance preference. Use up to four times daily as directed. (FOR ICD-9 250.00, 250.01). 1 each 0  . cholestyramine (QUESTRAN) 4 g packet Take 1 packet (4 g total) by mouth 2 (two) times daily. 60 each 3  . cyclobenzaprine (FLEXERIL) 5 MG tablet Take 1 tablet (5 mg total) by mouth at bedtime. Use as needed for neck pain 30 tablet 3  . glimepiride (AMARYL) 4 MG tablet TAKE TWO TABLETS BY MOUTH ONCE DAILY WITH BREAKFAST 180 tablet 3  . glucose blood test strip Test blood sugar daily. Dx code: E11.8. 100 each 3  . hydrochlorothiazide (MICROZIDE) 12.5 MG capsule TAKE ONE CAPSULE BY MOUTH ONCE DAILY. 90 capsule 3  . Lancets MISC Pt uses One Touch.  Test 1 or 2 times day 100 each 1  . lisinopril (PRINIVIL,ZESTRIL) 20 MG tablet TAKE ONE TABLET BY MOUTH ONCE DAILY 90 tablet 3  . meloxicam (MOBIC) 15 MG tablet TAKE  ONE TABLET BY MOUTH DAILY.   USE  AS  NEEDED  FOR  KNEE  AND  BACK  PAIN 30 tablet 2  . metFORMIN (GLUCOPHAGE-XR) 500 MG 24 hr tablet TAKE 2 TABLETS BY MOUTH TWICE DAILY WITH A MEAL 360 tablet 0  . naproxen (NAPROSYN) 500 MG tablet Take 1 tablet (500 mg total) by mouth 2 (two) times daily with a meal. 60 tablet 5  . pantoprazole (PROTONIX) 40 MG tablet TAKE ONE TABLET BY MOUTH ONCE DAILY 90 tablet 3   No facility-administered medications prior to visit.     ROS See HPI  Objective:  BP (!) 168/74   Pulse (!) 102   Temp 98.7 F (37.1 C)   Ht 5' 6"  (1.676 m)   Wt 213 lb (96.6 kg)   SpO2 99%   BMI 34.38 kg/m   BP Readings from Last 3 Encounters:  11/12/17 (!) 168/74  09/06/17 (!) 117/58  05/13/17 138/82    Wt Readings from Last 3 Encounters:  11/12/17 213 lb (96.6 kg)  09/06/17 216 lb 9.6 oz (98.2 kg)  05/13/17 212 lb (96.2 kg)    Physical Exam  Constitutional: She is oriented to person, place, and time.  HENT:  Right Ear: External ear and ear canal normal. A middle ear effusion is present.  Left Ear: External ear and ear canal normal. A middle ear effusion is present.  Nose: Mucosal edema and rhinorrhea present. Right sinus exhibits maxillary sinus tenderness and  frontal sinus tenderness. Left sinus exhibits maxillary sinus tenderness and frontal sinus tenderness.  Mouth/Throat: Uvula is midline. No trismus in the jaw. Posterior oropharyngeal erythema present. No oropharyngeal exudate.  Eyes: Conjunctivae, EOM and lids are normal. Pupils are equal, round, and reactive to light. No scleral icterus.  Neck: Normal range of motion. Neck supple.  Cardiovascular: Normal rate and normal heart sounds.  Pulmonary/Chest: Effort normal and breath sounds normal.  Musculoskeletal: She exhibits no edema.  Lymphadenopathy:    She has cervical adenopathy.  Neurological: She is alert and oriented to person, place, and time.  Vitals reviewed.   Lab Results  Component Value Date   WBC  9.8 09/06/2017   HGB 11.7 (L) 09/06/2017   HCT 37.6 09/06/2017   PLT 322.0 09/06/2017   GLUCOSE 156 (H) 09/06/2017   CHOL 151 11/19/2016   TRIG 123.0 11/19/2016   HDL 47.20 11/19/2016   LDLCALC 79 11/19/2016   ALT 47 (H) 11/19/2016   AST 22 11/19/2016   NA 136 09/06/2017   K 3.8 09/06/2017   CL 101 09/06/2017   CREATININE 0.59 09/06/2017   BUN 8 09/06/2017   CO2 28 09/06/2017   TSH 1.03 05/13/2017   HGBA1C 8.5 (H) 09/06/2017   MICROALBUR 1.1 07/26/2015    Mm Diag Breast Tomo Bilateral  Result Date: 07/01/2017 CLINICAL DATA:  Delayed follow-up of the right breast examination of the left breast. 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 Two additional probably benign fibroadenomas visible mammography in the right breast are being followed, and today is the 2 year follow-up. The patient is asymptomatic. EXAM: 2D DIGITAL DIAGNOSTIC BILATERAL MAMMOGRAM WITH CAD AND ADJUNCT TOMO COMPARISON:  Previous exam(s). ACR Breast Density Category b: There are scattered areas of fibroglandular density. FINDINGS: Previously biopsied mass in the lateral right breast, 9:30 position, is mammographically stable and contains central biopsy clip. A smaller circumscribed mass with associated calcifications in the more posterior outer right breast is mammographically stable and most consistent with degenerating fibroadenoma as previously described. In the slightly lateral right breast, middle third, is a third circumscribed gently lobulated oval mass that is mammographically stable for at least 2 years. No new or suspicious mass, distortion, or suspicious microcalcification is identified in either breast to suggest malignancy. Mammographic images were processed with CAD. IMPRESSION: No evidence of malignancy in either breast. 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 RECOMMENDATION: Screening mammogram in one year.(Code:[id number]-[id number]) I have discussed the findings and recommendations with the patient. Results were also provided in writing at the conclusion of the visit. If applicable, a reminder letter will be sent to the patient regarding the next appointment. BI-RADS CATEGORY  2: Benign. Electronically Signed   By: Curlene Dolphin M.D.   On: 07/01/2017 08:52    Assessment & Plan:   Bailey [name] was seen today for sinusitis.  Diagnoses and all orders for this visit:  Acute non-recurrent maxillary sinusitis -     fluticasone (FLONASE) 50 MCG/ACT nasal spray; Place 2 sprays into both nostrils daily. -     oxymetazoline (AFRIN NASAL SPRAY) 0.05 % nasal spray; Place 1 spray into both nostrils 2 (two) times daily. Use only for 3days, then stop -     guaiFENesin (MUCINEX) 600 MG 12 hr tablet; Take 1 tablet (600 mg total) by  mouth 2 (two) times daily as needed for cough or to loosen phlegm. -     amoxicillin-clavulanate (AUGMENTIN) 875-125 MG tablet; Take 1 tablet by mouth 2 (two) times daily. -     sodium chloride (OCEAN) 0.65 % SOLN nasal spray; Place 1 spray into both nostrils as needed for congestion.   I am having Bailey [name] start on fluticasone, oxymetazoline, guaiFENesin, amoxicillin-clavulanate, and sodium chloride. I am also having her maintain her glucose blood, acetaminophen, blood glucose meter kit and supplies, cholestyramine, Lancets, cyclobenzaprine, acetaminophen-codeine, meloxicam, pantoprazole, lisinopril, glimepiride, naproxen, atorvastatin, metFORMIN, and hydrochlorothiazide.  Meds ordered this encounter  Medications  . fluticasone (FLONASE) 50 MCG/ACT nasal spray    Sig: Place 2 sprays into both nostrils daily.    Dispense:  16 g    Refill:  0    Order Specific Question:   Supervising Provider    Answer:   Lucille Passy [[id number]]  . oxymetazoline  (AFRIN NASAL SPRAY) 0.05 % nasal spray    Sig: Place 1 spray into both nostrils 2 (two) times daily. Use only for 3days, then stop    Dispense:  30 mL    Refill:  0    Order Specific Question:   Supervising Provider    Answer:   Lucille Passy [[id number]]  . guaiFENesin (MUCINEX) 600 MG 12 hr tablet    Sig: Take 1 tablet (600 mg total) by mouth 2 (two) times daily as needed for cough or to loosen phlegm.    Dispense:  14 tablet    Refill:  0    Order Specific Question:   Supervising Provider    Answer:   Lucille Passy [[id number]]  . amoxicillin-clavulanate (AUGMENTIN) 875-125 MG tablet    Sig: Take 1 tablet by mouth 2 (two) times daily.    Dispense:  14 tablet    Refill:  0    Order Specific Question:   Supervising Provider    Answer:   Lucille Passy [[id number]]  . sodium chloride (OCEAN) 0.65 % SOLN nasal spray    Sig: Place 1 spray into both nostrils as needed for congestion.    Dispense:  15 mL    Refill:  0    Order Specific Question:   Supervising Provider    Answer:   Lucille Passy [[id number]]    Follow-up: No Follow-up on file.  Wilfred Lacy, NP

## 2017-11-12 NOTE — Patient Instructions (Addendum)
[facility] Instructions: Flonase and Afrin use: apply 1spray of afrin in each nare, wait 63mins, then apply 2sprays of flonase in each nare. Use both nasal spray consecutively x 3days, then flonase only for at least 14days.  Encourage adequate oral hydration.  Avoid decongestants if you have high blood pressure. Use" Delsym" or" Robitussin" cough syrup varietis for cough.  You can use plain "Tylenol" or "Advi"l for fever, chills and achyness.

## 2018-02-07 ENCOUNTER — Other Ambulatory Visit: Payer: Self-pay | Admitting: Family Medicine

## 2018-02-13 NOTE — Progress Notes (Addendum)
[facility] [address] [place], [place] [address] [phone]  Date:  02/16/2018   Name:  Bailey [name]   DOB:  [birth year]   MRN:  [id number]  PCP:  Darreld Mclean, MD    Chief Complaint: Diabetes (3 month follow up) and Breast Soreness (had mammogram, 3 weeks after developed pain in right breast)   History of Present Illness:  Bailey [name] is a 62 y.o. very pleasant female patient who presents with the following:  Here today for a 6 month follow-up History of DM with retinopathy, HTN, hyperlipidemia, fatty liver  Last seen here in December, labs notes as follows Metabolic profile is fine except your blood sugar is a bit high. Also, your A1c (average blood sugar) is too high.  As you are already on a good dose of metformin and also amaryl, I would suggest that we try adding a medication in the SLGT2 inhibitor class.  These medications cause you to urinate out glucose, which assists in lowering your blood sugar and can also help with weight loss.  Would it be ok with you if I add this medication to your regimen?  You can reply to me right here. Your blood count is ok- you are slightly anemic, but not enough to cause any symptoms.  We will plan to recheck this at your next visit Pt read the message above but did not reply so we did not adjust her medications   Lipitor amaryl Metformin Lisinopril hctz  Foot exam is due- will do today A1c due She is fasting today for labs   Pt is ok with adding an SGLT2 inhibitor if need be - will consider this depending on her A1c She had her mammogram last fall- pt also reports that she had some imaging done more recently but I cannot see this   She now has right breast pain for about 3 weeks.  No mass or swelling, but it seems to be sore to the touch  She does have fibrocystic breasts per her report  She had a bx in 2016  Patient Active Problem List   Diagnosis Date  Noted  . Diabetic retinopathy ([place]) 02/20/2017  . Fatty liver 11/24/2016  . GERD (gastroesophageal reflux disease) 07/26/2015  . Hyperlipidemia 07/26/2015  . Dyspepsia 07/26/2015  . Essential hypertension 07/26/2015  . Fibrocystic breast disease 03/22/2015  . Type 2 diabetes mellitus treated without insulin ([place]) 02/16/2013    Past Medical History:  Diagnosis Date  . Arthritis   . Diabetes mellitus without complication ([place])   . Fibrocystic breast disease July 2016  . Hypertension     Past Surgical History:  Procedure Laterality Date  . BREAST BIOPSY Right 04/19/2015   benign  . CHOLECYSTECTOMY    . UTERINE FIBROID SURGERY      Social History   Tobacco Use  . Smoking status: Current Every Day Smoker    Packs/day: 1.00    Years: 20.00    Pack years: 20.00    Types: Cigarettes  . Smokeless tobacco: Never Used  . Tobacco comment: Tobacco info given 10/11/15  Substance Use Topics  . Alcohol use: No    Alcohol/week: 0.0 oz  . Drug use: No    Family History  Problem Relation Age of Onset  . Diabetes Mother   . Stroke Mother   . Dementia Mother   . Heart disease Father   . Diabetes Father   .  Diabetes Brother   . Dementia Brother   . Renal Disease Brother   . Diabetes Sister   . Narcolepsy Sister   . Arthritis Sister   . Diabetes Sister   . Obesity Sister   . Diabetes Brother   . Heart disease Brother   . Cancer Brother        pancreatic  . Diabetes Brother   . Diabetes Brother   . Colon cancer Neg Hx   . Esophageal cancer Neg Hx   . Stomach cancer Neg Hx   . Rectal cancer Neg Hx     No Known Allergies  Medication list has been reviewed and updated.  Current Outpatient Medications on File Prior to Visit  Medication Sig Dispense Refill  . acetaminophen (TYLENOL) 500 MG tablet Take 1,000 mg by mouth every 6 (six) hours as needed (pain). Reported on 04/08/2016    . acetaminophen-codeine (TYLENOL #3) 300-30 MG tablet Take 1-2 tablets by mouth every 6  (six) hours as needed for moderate pain. 20 tablet 0  . atorvastatin (LIPITOR) 40 MG tablet TAKE ONE TABLET BY MOUTH ONCE DAILY 90 tablet 3  . blood glucose meter kit and supplies KIT Dispense based on patient and insurance preference. Use up to four times daily as directed. (FOR ICD-9 250.00, 250.01). 1 each 0  . cholestyramine (QUESTRAN) 4 g packet Take 1 packet (4 g total) by mouth 2 (two) times daily. 60 each 3  . cyclobenzaprine (FLEXERIL) 5 MG tablet Take 1 tablet (5 mg total) by mouth at bedtime. Use as needed for neck pain 30 tablet 3  . fluticasone (FLONASE) 50 MCG/ACT nasal spray Place 2 sprays into both nostrils daily. 16 g 0  . glimepiride (AMARYL) 4 MG tablet TAKE TWO TABLETS BY MOUTH ONCE DAILY WITH BREAKFAST 180 tablet 3  . glucose blood test strip Test blood sugar daily. Dx code: E11.8. 100 each 3  . hydrochlorothiazide (MICROZIDE) 12.5 MG capsule TAKE ONE CAPSULE BY MOUTH ONCE DAILY. 90 capsule 3  . Lancets MISC Pt uses One Touch.  Test 1 or 2 times day 100 each 1  . lisinopril (PRINIVIL,ZESTRIL) 20 MG tablet TAKE ONE TABLET BY MOUTH ONCE DAILY 90 tablet 3  . meloxicam (MOBIC) 15 MG tablet TAKE ONE TABLET BY MOUTH DAILY.   USE  AS  NEEDED  FOR  KNEE  AND  BACK  PAIN 30 tablet 2  . metFORMIN (GLUCOPHAGE-XR) 500 MG 24 hr tablet TAKE 2 TABLETS BY MOUTH TWICE DAILY WITH A MEAL 360 tablet 1  . naproxen (NAPROSYN) 500 MG tablet Take 1 tablet (500 mg total) by mouth 2 (two) times daily with a meal. 60 tablet 5  . oxymetazoline (AFRIN NASAL SPRAY) 0.05 % nasal spray Place 1 spray into both nostrils 2 (two) times daily. Use only for 3days, then stop 30 mL 0  . pantoprazole (PROTONIX) 40 MG tablet TAKE ONE TABLET BY MOUTH ONCE DAILY 90 tablet 3  . sodium chloride (OCEAN) 0.65 % SOLN nasal spray Place 1 spray into both nostrils as needed for congestion. 15 mL 0   No current facility-administered medications on file prior to visit.     Review of Systems:  As per HPI- otherwise  negative.   Physical Examination: Vitals:   02/16/18 0956  BP: 132/88  Pulse: 89  Resp: 16  SpO2: 98%   Vitals:   02/16/18 0956  Weight: 213 lb 3.2 oz (96.7 kg)  Height: _0  (1.676 m)   Body mass index  is 34.41 kg/m. Ideal Body Weight: Weight in (lb) to have BMI = 25: 154.6  GEN: WDWN, NAD, Non-toxic, A & O x 3, obese, looks well  HEENT: Atraumatic, Normocephalic. Neck supple. No masses, No LAD. Ears and Nose: No external deformity. CV: RRR, No M/G/R. No JVD. No thrill. No extra heart sounds. PULM: CTA B, no wheezes, crackles, rhonchi. No retractions. No resp. distress. No accessory muscle use. ABD: S, NT, ND, +BS. No rebound. No HSM. EXTR: No c/c/e NEURO Normal gait.  PSYCH: Normally interactive. Conversant. Not depressed or anxious appearing.  Calm demeanor.  Normal breast exam today.  Pt notes the area of concern in the right breast at 9:00  Normal foot exam today   Assessment and Plan: Essential hypertension - Plan: CBC  Hyperlipidemia, unspecified hyperlipidemia type - Plan: Lipid panel  Controlled type 2 diabetes mellitus without complication, without long-term current use of insulin ([place]) - Plan: Comprehensive metabolic panel, Hemoglobin A1c  Concern about breast cancer in female without diagnosis - Plan: MM DIAG BREAST TOMO UNI RIGHT  Follow-up visit today Labs pending as above Referral for diagnostic mammo for her May need to adjust meds based on her A1c  Signed Lamar Blinks, MD Await A1c Received A1c 5/31 Lab Results  Component Value Date   HGBA1C 9.2 (H) 02/16/2018   Message to pt: Your A1c has gone up again- we need to add another medication to bring your blood sugar under control I am going to rx a new medication called Wilder Glade- you will take it by mouth once a day. This will hopefully help [place] bring your A1c under 7%.  As always diet and exercise are also crucial!  Your metabolic profile is ok except your alk phosphatase level has gone up.   This level has been mildly up for you over the years, but not this high.  There is no need for alarm, but I do want to follow this closely.  Please come in for a liver panel (lab only) in 4-6 weeks and we will see how this trends  Cholesterol looks fine- continue current medications You are mildly anemic, as has been the case for the last year or so.  I am going to send you some stool cards in the mail so we can make sure you are not losing any blood from your colon.  Take care and please see me in about 3 months for a recheck  Results for orders placed or performed in visit on 02/16/18  Comprehensive metabolic panel  Result Value Ref Range   Sodium 137 135 - 145 mEq/L   Potassium 4.3 3.5 - 5.1 mEq/L   Chloride 103 96 - 112 mEq/L   CO2 26 19 - 32 mEq/L   Glucose, Bld 260 (H) 70 - 99 mg/dL   BUN 12 6 - 23 mg/dL   Creatinine, Ser 0.59 0.40 - 1.20 mg/dL   Total Bilirubin 0.3 0.2 - 1.2 mg/dL   Alkaline Phosphatase 176 (H) 39 - 117 U/L   AST 22 0 - 37 U/L   ALT 43 (H) 0 - 35 U/L   Total Protein 7.1 6.0 - 8.3 g/dL   Albumin 4.0 3.5 - 5.2 g/dL   Calcium 9.7 8.4 - 10.5 mg/dL   GFR 132.53 >60.00 mL/min  CBC  Result Value Ref Range   WBC 9.3 4.0 - 10.5 K/uL   RBC 5.25 (H) 3.87 - 5.11 Mil/uL   Platelets 337.0 150.0 - 400.0 K/uL   Hemoglobin 11.5 (L)  12.0 - 15.0 g/dL   HCT 37.1 36.0 - 46.0 %   MCV 70.7 (L) 78.0 - 100.0 fl   MCHC 30.9 30.0 - 36.0 g/dL   RDW 19.0 (H) 11.5 - 15.5 %  Lipid panel  Result Value Ref Range   Cholesterol 170 0 - 200 mg/dL   Triglycerides 91.0 0.0 - 149.0 mg/dL   HDL 43.20 >39.00 mg/dL   VLDL 18.2 0.0 - 40.0 mg/dL   LDL Cholesterol 109 (H) 0 - 99 mg/dL   Total CHOL/HDL Ratio 4    NonHDL 126.80

## 2018-02-16 ENCOUNTER — Ambulatory Visit (INDEPENDENT_AMBULATORY_CARE_PROVIDER_SITE_OTHER): Admitting: Family Medicine

## 2018-02-16 ENCOUNTER — Other Ambulatory Visit

## 2018-02-16 ENCOUNTER — Encounter: Payer: Self-pay | Admitting: Family Medicine

## 2018-02-16 VITALS — BP 132/88 | HR 89 | Resp 16 | Ht 66.0 in | Wt 213.2 lb

## 2018-02-16 DIAGNOSIS — I10 Essential (primary) hypertension: Secondary | ICD-10-CM | POA: Diagnosis not present

## 2018-02-16 DIAGNOSIS — R748 Abnormal levels of other serum enzymes: Secondary | ICD-10-CM

## 2018-02-16 DIAGNOSIS — E119 Type 2 diabetes mellitus without complications: Secondary | ICD-10-CM

## 2018-02-16 DIAGNOSIS — Z711 Person with feared health complaint in whom no diagnosis is made: Secondary | ICD-10-CM | POA: Diagnosis not present

## 2018-02-16 DIAGNOSIS — E785 Hyperlipidemia, unspecified: Secondary | ICD-10-CM | POA: Diagnosis not present

## 2018-02-16 DIAGNOSIS — D649 Anemia, unspecified: Secondary | ICD-10-CM

## 2018-02-16 LAB — COMPREHENSIVE METABOLIC PANEL
ALT: 43 U/L — ABNORMAL HIGH (ref 0–35)
AST: 22 U/L (ref 0–37)
Albumin: 4 g/dL (ref 3.5–5.2)
Alkaline Phosphatase: 176 U/L — ABNORMAL HIGH (ref 39–117)
BUN: 12 mg/dL (ref 6–23)
CHLORIDE: 103 meq/L (ref 96–112)
CO2: 26 meq/L (ref 19–32)
CREATININE: 0.59 mg/dL (ref 0.40–1.20)
Calcium: 9.7 mg/dL (ref 8.4–10.5)
GFR: 132.53 mL/min (ref >60.00)
GLUCOSE: 260 mg/dL — AB (ref 70–99)
Potassium: 4.3 mEq/L (ref 3.5–5.1)
SODIUM: 137 meq/L (ref 135–145)
Total Bilirubin: 0.3 mg/dL (ref 0.2–1.2)
Total Protein: 7.1 g/dL (ref 6.0–8.3)

## 2018-02-16 LAB — LIPID PANEL
CHOLESTEROL: 170 mg/dL (ref 0–200)
HDL: 43.2 mg/dL (ref >39.00)
LDL CALC: 109 mg/dL — AB (ref 0–99)
NonHDL: 126.8
Total CHOL/HDL Ratio: 4
Triglycerides: 91 mg/dL (ref 0.0–149.0)
VLDL: 18.2 mg/dL (ref 0.0–40.0)

## 2018-02-16 LAB — CBC
HEMATOCRIT: 37.1 % (ref 36.0–46.0)
Hemoglobin: 11.5 g/dL — ABNORMAL LOW (ref 12.0–15.0)
MCHC: 30.9 g/dL (ref 30.0–36.0)
MCV: 70.7 fl — AB (ref 78.0–100.0)
Platelets: 337 10*3/uL (ref 150.0–400.0)
RBC: 5.25 Mil/uL — ABNORMAL HIGH (ref 3.87–5.11)
RDW: 19 % — AB (ref 11.5–15.5)
WBC: 9.3 10*3/uL (ref 4.0–10.5)

## 2018-02-16 MED ORDER — CHOLESTYRAMINE 4 G PO PACK: 4.0000 g | PACK | Freq: Two times a day (BID) | ORAL | 3 refills | Status: DC

## 2018-02-16 NOTE — Patient Instructions (Addendum)
Good to see you today- we will set up imaging of your right breast to check out the area of concern I will be in touch with your labs asap If need be, we can add another medication for your blood sugar

## 2018-02-17 LAB — HEMOGLOBIN A1C
HEMOGLOBIN A1C: 9.2 %{Hb} — AB (ref <5.7)
Mean Plasma Glucose: 217 (calc)
eAG (mmol/L): 12 (calc)

## 2018-02-18 ENCOUNTER — Encounter: Payer: Self-pay | Admitting: Family Medicine

## 2018-02-18 MED ORDER — DAPAGLIFLOZIN PROPANEDIOL 5 MG PO TABS: 5.0000 mg | ORAL_TABLET | Freq: Every day | ORAL | 6 refills | Status: DC

## 2018-02-18 NOTE — Addendum Note (Signed)
Addended by: Lamar Blinks C on: 02/18/2018 02:25 PM   Modules accepted: Orders

## 2018-03-04 ENCOUNTER — Other Ambulatory Visit: Payer: Self-pay | Admitting: Family Medicine

## 2018-03-04 DIAGNOSIS — Z711 Person with feared health complaint in whom no diagnosis is made: Secondary | ICD-10-CM

## 2018-03-07 ENCOUNTER — Ambulatory Visit
Admission: RE | Admit: 2018-03-07 | Discharge: 2018-03-07 | Disposition: A | Discharge status: Home / Self Care | Source: Ambulatory Visit | Attending: Family Medicine | Admitting: Family Medicine

## 2018-03-07 ENCOUNTER — Ambulatory Visit: Admission: RE | Admit: 2018-03-07 | Source: Ambulatory Visit

## 2018-03-07 DIAGNOSIS — Z711 Person with feared health complaint in whom no diagnosis is made: Secondary | ICD-10-CM

## 2018-03-08 ENCOUNTER — Encounter: Payer: Self-pay | Admitting: Family Medicine

## 2018-03-08 ENCOUNTER — Other Ambulatory Visit: Payer: Self-pay | Admitting: Emergency Medicine

## 2018-03-08 ENCOUNTER — Other Ambulatory Visit (INDEPENDENT_AMBULATORY_CARE_PROVIDER_SITE_OTHER)

## 2018-03-08 DIAGNOSIS — D649 Anemia, unspecified: Secondary | ICD-10-CM | POA: Diagnosis not present

## 2018-03-08 LAB — FECAL OCCULT BLOOD, IMMUNOCHEMICAL: FECAL OCCULT BLD: NEGATIVE

## 2018-04-12 ENCOUNTER — Other Ambulatory Visit: Payer: Self-pay | Admitting: Obstetrics and Gynecology

## 2018-05-08 ENCOUNTER — Other Ambulatory Visit: Payer: Self-pay | Admitting: Family Medicine

## 2018-05-08 DIAGNOSIS — E119 Type 2 diabetes mellitus without complications: Secondary | ICD-10-CM

## 2018-07-27 ENCOUNTER — Other Ambulatory Visit: Payer: Self-pay | Admitting: Family Medicine

## 2018-07-27 DIAGNOSIS — Z1231 Encounter for screening mammogram for malignant neoplasm of breast: Secondary | ICD-10-CM

## 2018-07-31 NOTE — Progress Notes (Addendum)
[facility] [address], [place], [place] [address] [phone]  Date:  08/03/2018   Name:  EMSLEE [name]   DOB:  [birth year]   MRN:  [id number]  PCP:  Darreld Mclean, MD    Chief Complaint: Hypertension (3 month follow up) and Diabetes (could not afford farxiga)   History of Present Illness:  Bailey [name] is a 62 y.o. very pleasant female patient who presents with the following:  Periodic follow- up today History of DM, hyperlipidemia, HTN, diabetic retinopathy  Lab Results  Component Value Date   HGBA1C 9.2 (H) 02/16/2018   Last visit here in May: Your A1c has gone up again- we need to add another medication to bring your blood sugar under control I am going to rx a new medication called Wilder Glade- you will take it by mouth once a day. This will hopefully help [place] bring your A1c under 7%.  As always diet and exercise are also crucial! Your metabolic profile is ok except your alk phosphatase level has gone up.  This level has been mildly up for you over the years, but not this high.  There is no need for alarm, but I do want to follow this closely.  Please come in for a liver panel (lab only) in 4-6 weeks and we will see how this trends Cholesterol looks fine- continue current medications You are mildly anemic, as has been the case for the last year or so.  I am going to send you some stool cards in the mail so we can make sure you are not losing any blood from your colon. Take care and please see me in about 3 months for a recheck  Her FOBT was negative and she added some iron  She did not end up starting Farxiga as it was too expensive   lipitor questral flonase amaryl hctz Lisinopril Metformin 1000 BID protonix  She is trying to eat right  Wt Readings from Last 3 Encounters:  08/03/18 208 lb (94.3 kg)  02/16/18 213 lb 3.2 oz (96.7 kg)  11/12/17 213 lb (96.6 kg)   Her weight is down 5 lbs   She is not exercising really as of yet  She has an eye appt coming up next week  She goes every 3 months   She had one cataract done so far and plans to do the other one soon Flu shot today  She has noted a skin finding on her back for about 3-4 months It is itchy but not painful   She has GERD and uses protonix She finds that when she has reflux she may have chest pressure This tends to occur after eating-often at bedtime after dinner It may last 30 minutes- taking tums does not help really  May occur 3-4 x a week She has noted this also if she is walking too fast- she has noted this for the last year or so If she avoids over exerting herself she does not have this problem She last had the chest pressure at some point this week but she does not recall exactly when No SOB   She is still smoking She has never seen cardiology or done a stress test that she can recall   Patient Active Problem List   Diagnosis Date Noted  . Diabetic retinopathy ([place]) 02/20/2017  . Fatty liver 11/24/2016  . GERD (gastroesophageal reflux disease) 07/26/2015  . Hyperlipidemia 07/26/2015  .  Dyspepsia 07/26/2015  . Essential hypertension 07/26/2015  . Fibrocystic breast disease 03/22/2015  . Type 2 diabetes mellitus treated without insulin ([place]) 02/16/2013    Past Medical History:  Diagnosis Date  . Arthritis   . Diabetes mellitus without complication ([place])   . Fibrocystic breast disease July 2016  . Hypertension     Past Surgical History:  Procedure Laterality Date  . BREAST BIOPSY Right 04/19/2015   benign  . CHOLECYSTECTOMY    . UTERINE FIBROID SURGERY      Social History   Tobacco Use  . Smoking status: Current Every Day Smoker    Packs/day: 1.00    Years: 20.00    Pack years: 20.00    Types: Cigarettes  . Smokeless tobacco: Never Used  . Tobacco comment: Tobacco info given 10/11/15  Substance Use Topics  . Alcohol use: No    Alcohol/week: 0.0 standard drinks  . Drug use:  No    Family History  Problem Relation Age of Onset  . Diabetes Mother   . Stroke Mother   . Dementia Mother   . Heart disease Father   . Diabetes Father   . Diabetes Brother   . Dementia Brother   . Renal Disease Brother   . Diabetes Sister   . Narcolepsy Sister   . Arthritis Sister   . Diabetes Sister   . Obesity Sister   . Diabetes Brother   . Heart disease Brother   . Cancer Brother        pancreatic  . Diabetes Brother   . Diabetes Brother   . Colon cancer Neg Hx   . Esophageal cancer Neg Hx   . Stomach cancer Neg Hx   . Rectal cancer Neg Hx     No Known Allergies  Medication list has been reviewed and updated.  Current Outpatient Medications on File Prior to Visit  Medication Sig Dispense Refill  . acetaminophen (TYLENOL) 500 MG tablet Take 1,000 mg by mouth every 6 (six) hours as needed (pain). Reported on 04/08/2016    . atorvastatin (LIPITOR) 40 MG tablet TAKE ONE TABLET BY MOUTH ONCE DAILY 90 tablet 3  . blood glucose meter kit and supplies KIT Dispense based on patient and insurance preference. Use up to four times daily as directed. (FOR ICD-9 250.00, 250.01). 1 each 0  . cholestyramine (QUESTRAN) 4 g packet Take 1 packet (4 g total) by mouth 2 (two) times daily. 60 each 3  . cyclobenzaprine (FLEXERIL) 5 MG tablet Take 1 tablet (5 mg total) by mouth at bedtime. Use as needed for neck pain 30 tablet 3  . fluticasone (FLONASE) 50 MCG/ACT nasal spray Place 2 sprays into both nostrils daily. 16 g 0  . glimepiride (AMARYL) 4 MG tablet TAKE TWO TABLETS BY MOUTH ONCE DAILY WITH BREAKFAST 180 tablet 3  . glucose blood (ONE TOUCH ULTRA TEST) test strip USE ONE STRIP TO CHECK GLUCOSE ONCE DAILY 75 each 3  . hydrochlorothiazide (MICROZIDE) 12.5 MG capsule TAKE ONE CAPSULE BY MOUTH ONCE DAILY. 90 capsule 3  . Lancets MISC Pt uses One Touch.  Test 1 or 2 times day 100 each 1  . lisinopril (PRINIVIL,ZESTRIL) 20 MG tablet TAKE ONE TABLET BY MOUTH ONCE DAILY 90 tablet 3  .  meloxicam (MOBIC) 15 MG tablet TAKE ONE TABLET BY MOUTH DAILY.   USE  AS  NEEDED  FOR  KNEE  AND  BACK  PAIN 30 tablet 2  . metFORMIN (GLUCOPHAGE-XR) 500 MG 24 hr  tablet TAKE 2 TABLETS BY MOUTH TWICE DAILY WITH A MEAL 360 tablet 1  . naproxen (NAPROSYN) 500 MG tablet Take 1 tablet (500 mg total) by mouth 2 (two) times daily with a meal. 60 tablet 5  . pantoprazole (PROTONIX) 40 MG tablet TAKE ONE TABLET BY MOUTH ONCE DAILY 90 tablet 3   No current facility-administered medications on file prior to visit.     Review of Systems:  As per HPI- otherwise negative. No fever or chills No vomiting    Physical Examination: Vitals:   08/03/18 0947  BP: 140/80  Pulse: (!) 105  Resp: 18  Temp: 98 F (36.7 C)  SpO2: 98%   Vitals:   08/03/18 0947  Weight: 208 lb (94.3 kg)  Height: 5' 6"  (1.676 m)   Body mass index is 33.57 kg/m. Ideal Body Weight: Weight in (lb) to have BMI = 25: 154.6  GEN: WDWN, NAD, Non-toxic, A & O x 3, obese, looks well  HEENT: Atraumatic, Normocephalic. Neck supple. No masses, No LAD. Ears and Nose: No external deformity. CV: RRR, No M/G/R. No JVD. No thrill. No extra heart sounds. PULM: CTA B, no wheezes, crackles, rhonchi. No retractions. No resp. distress. No accessory muscle use. ABD: S, NT, ND, +BS. No rebound. No HSM. EXTR: No c/c/e NEURO Normal gait.  PSYCH: Normally interactive. Conversant. Not depressed or anxious appearing.  Calm demeanor.  seb K right flank VC obtained, LN cryotherapy x 3 cycles to seb K. No complications  EKG:  SR with non- specific T wave changes, rate 89 Compared with tracing from 2016- she continues to show some T wave change although downgoingT seen in V6 has resolved  Assessment and Plan: Controlled type 2 diabetes mellitus without complication, without long-term current use of insulin ([facility]) - Plan: Hemoglobin A1c, Myocardial Perfusion Imaging  Chest tightness - Plan: EKG 12-Lead, Myocardial Perfusion Imaging, DG Chest 2  View, Troponin I -  Smoking - Plan: Myocardial Perfusion Imaging  Seborrheic keratoses  Overweight - Plan: Myocardial Perfusion Imaging  Immunization due  Needs flu shot - Plan: Flu Vaccine QUAD 36+ mos IM (Fluarix & Fluzone Quad PF   Flu shot today  Obtain A1c- if still high will find an alternative med for her She notes chest pressure- atypical but she is higher risk Troponin today Referral for stress test Encouraged her to DC tobacco  Not enough pack years for CT screening for lung cancer  Signed Lamar Blinks, MD  Dg Chest 2 View  Result Date: 08/03/2018 CLINICAL DATA:  Shortness of breath.  Chest tightness. EXAM: CHEST - 2 VIEW COMPARISON:  CT scan of November 20, 2016. Radiographs of May 11, 2013. FINDINGS: The heart size and mediastinal contours are within normal limits. Both lungs are clear. No pneumothorax or pleural effusion is noted. The visualized skeletal structures are unremarkable. IMPRESSION: No active cardiopulmonary disease. Electronically Signed   By: Marijo Conception, M.D.   On: 08/03/2018 10:51   Received her A1c 11/14, message to pt   Lab Results  Component Value Date   HGBA1C 8.0 (H) 08/03/2018   You have decreased your A1c by over 1%. If you can continue working on weight loss and diet, I think we can get you to goal (7% or less) without adding another medication.  Please keep up your efforts! Let's recheck in 3-4 months and see where you are  Let me know if you don't hear about your stress test appt coming up

## 2018-08-03 ENCOUNTER — Ambulatory Visit (HOSPITAL_BASED_OUTPATIENT_CLINIC_OR_DEPARTMENT_OTHER)
Admission: RE | Admit: 2018-08-03 | Discharge: 2018-08-03 | Disposition: A | Discharge status: Home / Self Care | Source: Ambulatory Visit | Attending: Family Medicine | Admitting: Family Medicine

## 2018-08-03 ENCOUNTER — Encounter: Payer: Self-pay | Admitting: Family Medicine

## 2018-08-03 ENCOUNTER — Other Ambulatory Visit

## 2018-08-03 ENCOUNTER — Ambulatory Visit (INDEPENDENT_AMBULATORY_CARE_PROVIDER_SITE_OTHER): Admitting: Family Medicine

## 2018-08-03 VITALS — BP 140/80 | HR 105 | Temp 98.0°F | Resp 18 | Ht 66.0 in | Wt 208.0 lb

## 2018-08-03 DIAGNOSIS — F17200 Nicotine dependence, unspecified, uncomplicated: Secondary | ICD-10-CM

## 2018-08-03 DIAGNOSIS — R748 Abnormal levels of other serum enzymes: Secondary | ICD-10-CM | POA: Diagnosis not present

## 2018-08-03 DIAGNOSIS — L821 Other seborrheic keratosis: Secondary | ICD-10-CM

## 2018-08-03 DIAGNOSIS — Z23 Encounter for immunization: Secondary | ICD-10-CM | POA: Diagnosis not present

## 2018-08-03 DIAGNOSIS — R0789 Other chest pain: Secondary | ICD-10-CM | POA: Diagnosis present

## 2018-08-03 DIAGNOSIS — E663 Overweight: Secondary | ICD-10-CM

## 2018-08-03 DIAGNOSIS — E119 Type 2 diabetes mellitus without complications: Secondary | ICD-10-CM | POA: Diagnosis not present

## 2018-08-03 DIAGNOSIS — Z794 Long term (current) use of insulin: Principal | ICD-10-CM

## 2018-08-03 LAB — COMPREHENSIVE METABOLIC PANEL
ALBUMIN: 4.4 g/dL (ref 3.5–5.2)
ALT: 26 U/L (ref 0–35)
AST: 17 U/L (ref 0–37)
Alkaline Phosphatase: 108 U/L (ref 39–117)
BUN: 8 mg/dL (ref 6–23)
CALCIUM: 10.3 mg/dL (ref 8.4–10.5)
CHLORIDE: 102 meq/L (ref 96–112)
CO2: 27 meq/L (ref 19–32)
CREATININE: 0.64 mg/dL (ref 0.40–1.20)
GFR: 120.48 mL/min (ref >60.00)
Glucose, Bld: 163 mg/dL — ABNORMAL HIGH (ref 70–99)
Potassium: 4.3 mEq/L (ref 3.5–5.1)
Sodium: 137 mEq/L (ref 135–145)
Total Bilirubin: 0.4 mg/dL (ref 0.2–1.2)
Total Protein: 7.8 g/dL (ref 6.0–8.3)

## 2018-08-03 LAB — CBC
HEMATOCRIT: 38.6 % (ref 36.0–46.0)
Hemoglobin: 12 g/dL (ref 12.0–15.0)
MCHC: 31.1 g/dL (ref 30.0–36.0)
MCV: 71.4 fl — AB (ref 78.0–100.0)
PLATELETS: 306 10*3/uL (ref 150.0–400.0)
RBC: 5.41 Mil/uL — ABNORMAL HIGH (ref 3.87–5.11)
RDW: 18.3 % — ABNORMAL HIGH (ref 11.5–15.5)
WBC: 9.7 10*3/uL (ref 4.0–10.5)

## 2018-08-03 LAB — TROPONIN I: TNIDX: 0.01 ug/l (ref 0.00–0.06)

## 2018-08-03 NOTE — Patient Instructions (Addendum)
Good to see you today- I will be in touch with your labs asap If need be we will add an alternative med to your diabetes regimen Good job with weight loss so far  The skin lesion on your right side should peel off in the next few weeks- please let me know if this does not occur   I am a bit concerned about the chest discomfort you have noticed. We are going to set you up for a stress test of your heart to make sure there is no sign of a heart blockage. Also, I will get an x-ray of your lungs today   Please go to the lab and then to x-ray on the ground floor We will contact you about your stress test appointment In the meantime please take a baby aspirin daily- until we make sure your heart is ok!

## 2018-08-04 ENCOUNTER — Encounter: Payer: Self-pay | Admitting: Family Medicine

## 2018-08-04 LAB — HEMOGLOBIN A1C
HEMOGLOBIN A1C: 8 %{Hb} — AB (ref <5.7)
Mean Plasma Glucose: 183 (calc)
eAG (mmol/L): 10.1 (calc)

## 2018-08-13 ENCOUNTER — Other Ambulatory Visit: Payer: Self-pay | Admitting: Family Medicine

## 2018-08-13 DIAGNOSIS — E119 Type 2 diabetes mellitus without complications: Secondary | ICD-10-CM

## 2018-08-16 ENCOUNTER — Other Ambulatory Visit: Payer: Self-pay | Admitting: Family Medicine

## 2018-08-16 ENCOUNTER — Telehealth (HOSPITAL_COMMUNITY): Payer: Self-pay

## 2018-08-16 ENCOUNTER — Telehealth (HOSPITAL_COMMUNITY): Payer: Self-pay | Admitting: *Deleted

## 2018-08-16 DIAGNOSIS — R1013 Epigastric pain: Secondary | ICD-10-CM

## 2018-08-16 DIAGNOSIS — I10 Essential (primary) hypertension: Secondary | ICD-10-CM

## 2018-08-16 NOTE — Telephone Encounter (Signed)
Left message on voicemail in reference to upcoming appointment scheduled for 07/24/18. Phone number given for a call back so details instructions can be given.  Bailey [name]

## 2018-08-16 NOTE — Telephone Encounter (Signed)
Patient given detailed instructions per Myocardial Perfusion Study Information Sheet for the test on 08/23/18 at 0730. Patient notified to arrive 15 minutes early and that it is imperative to arrive on time for appointment to keep from having the test rescheduled.  If you need to cancel or reschedule your appointment, please call the office within 24 hours of your appointment. . Patient verbalized understanding. EK

## 2018-08-23 ENCOUNTER — Encounter: Payer: Self-pay | Admitting: Family Medicine

## 2018-08-23 ENCOUNTER — Ambulatory Visit (HOSPITAL_COMMUNITY): Attending: Cardiovascular Disease

## 2018-08-23 DIAGNOSIS — R0789 Other chest pain: Secondary | ICD-10-CM | POA: Diagnosis present

## 2018-08-23 DIAGNOSIS — F17200 Nicotine dependence, unspecified, uncomplicated: Secondary | ICD-10-CM | POA: Diagnosis not present

## 2018-08-23 DIAGNOSIS — E663 Overweight: Secondary | ICD-10-CM | POA: Diagnosis not present

## 2018-08-23 DIAGNOSIS — E119 Type 2 diabetes mellitus without complications: Secondary | ICD-10-CM

## 2018-08-23 LAB — MYOCARDIAL PERFUSION IMAGING
CHL CUP NUCLEAR SRS: 0
CHL CUP NUCLEAR SSS: 1
CSEPPHR: 108 {beats}/min
LVDIAVOL: 62 mL (ref 46–106)
LVSYSVOL: 30 mL
Rest HR: 85 {beats}/min
SDS: 1
TID: 1.08

## 2018-08-23 MED ORDER — REGADENOSON 0.4 MG/5ML IV SOLN
0.4000 mg | Freq: Once | INTRAVENOUS | Status: AC
  Administered 2018-08-23: 0.4 mg via INTRAVENOUS

## 2018-08-23 MED ORDER — TECHNETIUM TC 99M TETROFOSMIN IV KIT
10.2000 | PACK | Freq: Once | INTRAVENOUS | Status: AC | PRN
  Administered 2018-08-23: 10.2 via INTRAVENOUS
  Filled 2018-08-23: qty 11

## 2018-08-23 MED ORDER — TECHNETIUM TC 99M TETROFOSMIN IV KIT
30.5000 | PACK | Freq: Once | INTRAVENOUS | Status: AC | PRN
  Administered 2018-08-23: 30.5 via INTRAVENOUS
  Filled 2018-08-23: qty 31

## 2018-09-02 ENCOUNTER — Other Ambulatory Visit: Payer: Self-pay | Admitting: Family Medicine

## 2018-09-08 ENCOUNTER — Ambulatory Visit
Admission: RE | Admit: 2018-09-08 | Discharge: 2018-09-08 | Disposition: A | Discharge status: Home / Self Care | Source: Ambulatory Visit | Attending: Family Medicine | Admitting: Family Medicine

## 2018-09-08 DIAGNOSIS — Z1231 Encounter for screening mammogram for malignant neoplasm of breast: Secondary | ICD-10-CM

## 2018-11-25 ENCOUNTER — Other Ambulatory Visit: Payer: Self-pay | Admitting: Family Medicine

## 2018-11-25 DIAGNOSIS — S46911S Strain of unspecified muscle, fascia and tendon at shoulder and upper arm level, right arm, sequela: Secondary | ICD-10-CM

## 2018-11-25 DIAGNOSIS — E785 Hyperlipidemia, unspecified: Secondary | ICD-10-CM

## 2018-11-25 DIAGNOSIS — I10 Essential (primary) hypertension: Secondary | ICD-10-CM

## 2019-03-28 ENCOUNTER — Telehealth: Payer: Self-pay

## 2019-03-28 MED ORDER — METFORMIN HCL 1000 MG PO TABS: 1000.0000 mg | ORAL_TABLET | Freq: Two times a day (BID) | ORAL | 3 refills | Status: DC

## 2019-03-28 NOTE — Telephone Encounter (Signed)
Pt called in states there is a recall on her Metformin need something else sent in only has one pill left. Pt scheduled apt 03/30/19.   Please Advise

## 2019-03-28 NOTE — Telephone Encounter (Signed)
Called her back- she is on metformin xr which was recalled.  Will change her to regular metformin 1000 BID She is seeing me on Thursday

## 2019-03-29 NOTE — Patient Instructions (Addendum)
It was a pleasure to see you today, I will be in touch with your labs as soon as possible Assuming that you labs look ok, we may have you increase your fluid pill for a few days to get the swelling out of your feet- please wait to hear from me!  Please try to elevate your legs/ walk around frequently Wearing compression socks can also be helpful

## 2019-03-29 NOTE — Progress Notes (Addendum)
[place] [facility] [address], [place], [place] [address] [phone]  Date:  03/30/2019   Name:  Bailey [name]   DOB:  05-23-1956   MRN:  [id number]  PCP:  Darreld Mclean, MD    Chief Complaint: Diabetes (med refill, metformin recall) and Foot Swelling (bilateral feet swelling, two weeks)   History of Present Illness:  Bailey [name] is a 63 y.o. very pleasant female patient who presents with the following:  Following up on chronic illness and medications today History of diabetes with retinopathy, hypertension, hyperlipidemia, fatty liver We changed her from metformin XR to regular yesterday due to recall  Last seen by myself in November-at that time she was having some chest pain, I referred her for stress test which was low risk She now notes that she continues to have some of this discomfort, but has realized that eating is what is causing her symptoms.  Certain foods especially because of her symptoms, she thinks it is heartburn.  We will refer to GI at this point  No SOB   Lab Results  Component Value Date   HGBA1C 8.0 (H) 08/03/2018   Need to recheck A1c today- Foot exam is due Can do complete labs today  Lipitor 40 Amaryl 8 mg twice daily Metformin thousand twice daily HCTZ 12.5 Lisinopril 20  Her feet have been swollen for about 2 weeks- seemed to start after she ate a salty meal-crab legs This sort of thing is happened after a salty meal in the past, but usually does not take a long resolve They are not painful- just swollen No weight change The swelling is only in her feet, not in her legs  She has been under some stress recently- 3 friends have died recently and another is on hospice with COPD  Finally, she has noted some scaly and dry areas on her skin.  She has tried cocoa butter, but they have not resolved Wt Readings from Last 3 Encounters:  03/30/19 208 lb (94.3 kg)  08/23/18  208 lb (94.3 kg)  08/03/18 208 lb (94.3 kg)     Patient Active Problem List   Diagnosis Date Noted  . Diabetic retinopathy ([place]) 02/20/2017  . Fatty liver 11/24/2016  . GERD (gastroesophageal reflux disease) 07/26/2015  . Hyperlipidemia 07/26/2015  . Dyspepsia 07/26/2015  . Essential hypertension 07/26/2015  . Fibrocystic breast disease 03/22/2015  . Type 2 diabetes mellitus treated without insulin ([place]) 02/16/2013    Past Medical History:  Diagnosis Date  . Arthritis   . Diabetes mellitus without complication ([place])   . Fibrocystic breast disease July 2016  . Hypertension     Past Surgical History:  Procedure Laterality Date  . BREAST BIOPSY Right 04/19/2015   benign  . CHOLECYSTECTOMY    . UTERINE FIBROID SURGERY      Social History   Tobacco Use  . Smoking status: Current Every Day Smoker    Packs/day: 1.00    Years: 20.00    Pack years: 20.00    Types: Cigarettes  . Smokeless tobacco: Never Used  . Tobacco comment: Tobacco info given 10/11/15  Substance Use Topics  . Alcohol use: No    Alcohol/week: 0.0 standard drinks  . Drug use: No    Family History  Problem Relation Age of Onset  . Diabetes Mother   . Stroke Mother   . Dementia Mother   . Heart disease Father   .  Diabetes Father   . Diabetes Brother   . Dementia Brother   . Renal Disease Brother   . Diabetes Sister   . Narcolepsy Sister   . Arthritis Sister   . Diabetes Sister   . Obesity Sister   . Diabetes Brother   . Heart disease Brother   . Cancer Brother        pancreatic  . Diabetes Brother   . Diabetes Brother   . Colon cancer Neg Hx   . Esophageal cancer Neg Hx   . Stomach cancer Neg Hx   . Rectal cancer Neg Hx     No Known Allergies  Medication list has been reviewed and updated.  Current Outpatient Medications on File Prior to Visit  Medication Sig Dispense Refill  . acetaminophen (TYLENOL) 500 MG tablet Take 1,000 mg by mouth every 6 (six) hours as needed (pain).  Reported on 04/08/2016    . atorvastatin (LIPITOR) 40 MG tablet Take 1 tablet by mouth once daily 90 tablet 1  . blood glucose meter kit and supplies KIT Dispense based on patient and insurance preference. Use up to four times daily as directed. (FOR ICD-9 250.00, 250.01). 1 each 0  . cholestyramine (QUESTRAN) 4 g packet Take 1 packet (4 g total) by mouth 2 (two) times daily. 60 each 3  . fluticasone (FLONASE) 50 MCG/ACT nasal spray Place 2 sprays into both nostrils daily. 16 g 0  . glimepiride (AMARYL) 4 MG tablet TAKE 2 TABLETS BY MOUTH ONCE DAILY WITH BREAKFAST 180 tablet 3  . glucose blood (ONE TOUCH ULTRA TEST) test strip USE ONE STRIP TO CHECK GLUCOSE ONCE DAILY 75 each 3  . hydrochlorothiazide (MICROZIDE) 12.5 MG capsule Take 1 capsule by mouth once daily 90 capsule 1  . Lancets MISC Pt uses One Touch.  Test 1 or 2 times day 100 each 1  . lisinopril (PRINIVIL,ZESTRIL) 20 MG tablet TAKE 1 TABLET BY MOUTH ONCE DAILY 90 tablet 3  . meloxicam (MOBIC) 15 MG tablet TAKE ONE TABLET BY MOUTH DAILY.   USE  AS  NEEDED  FOR  KNEE  AND  BACK  PAIN 30 tablet 2  . metFORMIN (GLUCOPHAGE) 1000 MG tablet Take 1 tablet (1,000 mg total) by mouth 2 (two) times daily with a meal. 180 tablet 3  . naproxen (NAPROSYN) 500 MG tablet TAKE 1 TABLET BY MOUTH TWICE DAILY WITH A MEAL. 60 tablet 2  . pantoprazole (PROTONIX) 40 MG tablet TAKE 1 TABLET BY MOUTH ONCE DAILY 30 tablet 11   No current facility-administered medications on file prior to visit.     Review of Systems:  As per HPI- otherwise negative.  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  Her job is not very physically active Physical Examination: Vitals:   03/30/19 1036  BP: 112/64  Pulse: (!) 102  Resp: 16  Temp: 98.5 F (36.9 C)  SpO2: 98%   Vitals:   03/30/19 1036  Weight: 208 lb (94.3 kg)  Height: 5' 6"  (1.676 m)   Body mass index is 33.57 kg/m. Ideal Body Weight: Weight in (lb) to have BMI = 25: 154.6  GEN:  WDWN, NAD, Non-toxic, A & O x 3, obese, looks well  HEENT: Atraumatic, Normocephalic. Neck supple. No masses, No LAD. Ears and Nose: No external deformity. CV: RRR, No M/G/R. No JVD. No thrill. No extra heart sounds. PULM: CTA B, no wheezes, crackles, rhonchi. No retractions. No resp. distress. No accessory  muscle use. ABD: S, NT, ND. No rebound. No HSM. EXTR: No c/c/e NEURO Normal gait.  PSYCH: Normally interactive. Conversant. Not depressed or anxious appearing.  Calm demeanor.  Soft pitting edema of the bilateral feet.  Mostly apparent in the dorsums of the feet.  The ankles and calves are normal bilaterally She has a few small patches of apparent eczema on her arms and neck BP Readings from Last 3 Encounters:  03/30/19 112/64  08/03/18 140/80  02/16/18 132/88   Pulse Readings from Last 3 Encounters:  03/30/19 (!) 102  08/03/18 (!) 105  02/16/18 89     Assessment and Plan:   ICD-10-CM   1. Controlled type 2 diabetes mellitus without complication, without long-term current use of insulin (HCC)  E11.9 CBC    Comprehensive metabolic panel    Hemoglobin A1c    Lipid panel    Microalbumin / creatinine urine ratio  2. Overweight  E66.3   3. Essential hypertension  I10 CBC    Comprehensive metabolic panel  4. Hyperlipidemia, unspecified hyperlipidemia type  E78.5 Lipid panel  5. Foot swelling  M79.89 B Nat Peptide  6. Gastroesophageal reflux disease, esophagitis presence not specified  K21.9 Ambulatory referral to Gastroenterology  7. Eczema, unspecified type  L30.9 triamcinolone cream (KENALOG) 0.1 %   Following up on chronic health conditions today Await labs to follow-up on diabetes Blood pressure under fine control We will check a BNP due to foot swelling.  Weight is stable Assuming labs are okay, may have her hold lisinopril and double up on HCTZ for a few days Went over other tips to decrease swelling, as follows:  Please try to elevate your legs/ walk around  frequently Wearing compression socks can also be helpful   Follow-up: No follow-ups on file.  Meds ordered this encounter  Medications  . triamcinolone cream (KENALOG) 0.1 %    Sig: Apply 1 application topically 2 (two) times daily. Use as needed on dry or scaly areas    Dispense:  60 g    Refill:  1   Orders Placed This Encounter  Procedures  . CBC  . Comprehensive metabolic panel  . Hemoglobin A1c  . Lipid panel  . Microalbumin / creatinine urine ratio  . B Nat Peptide  . Ambulatory referral to Gastroenterology      Signed Lamar Blinks, MD   Received her labs so far 7/10 Mild anemia Colon 2017  Results for orders placed or performed in visit on 03/30/19  CBC  Result Value Ref Range   WBC 10.4 4.0 - 10.5 K/uL   RBC 5.26 (H) 3.87 - 5.11 Mil/uL   Platelets 341.0 150.0 - 400.0 K/uL   Hemoglobin 11.4 (L) 12.0 - 15.0 g/dL   HCT 36.9 36.0 - 46.0 %   MCV 70.2 (L) 78.0 - 100.0 fl   MCHC 31.0 30.0 - 36.0 g/dL   RDW 18.2 (H) 11.5 - 15.5 %  Comprehensive metabolic panel  Result Value Ref Range   Sodium 134 (L) 135 - 145 mEq/L   Potassium 4.3 3.5 - 5.1 mEq/L   Chloride 100 96 - 112 mEq/L   CO2 25 19 - 32 mEq/L   Glucose, Bld 231 (H) 70 - 99 mg/dL   BUN 17 6 - 23 mg/dL   Creatinine, Ser 0.67 0.40 - 1.20 mg/dL   Total Bilirubin 0.3 0.2 - 1.2 mg/dL   Alkaline Phosphatase 121 (H) 39 - 117 U/L   AST 13 0 - 37 U/L  ALT 18 0 - 35 U/L   Total Protein 7.2 6.0 - 8.3 g/dL   Albumin 4.3 3.5 - 5.2 g/dL   Calcium 9.3 8.4 - 10.5 mg/dL   GFR 107.29 >60.00 mL/min  Lipid panel  Result Value Ref Range   Cholesterol 149 0 - 200 mg/dL   Triglycerides 85.0 0.0 - 149.0 mg/dL   HDL 42.20 >39.00 mg/dL   VLDL 17.0 0.0 - 40.0 mg/dL   LDL Cholesterol 90 0 - 99 mg/dL   Total CHOL/HDL Ratio 4    NonHDL 107.25   Microalbumin / creatinine urine ratio  Result Value Ref Range   Microalb, Ur 4.8 (H) 0.0 - 1.9 mg/dL   Creatinine,U 69.4 mg/dL   Microalb Creat Ratio 6.9 0.0 - 30.0 mg/g   B Nat Peptide  Result Value Ref Range   Pro B Natriuretic peptide (BNP) 7.0 0.0 - 100.0 pg/mL

## 2019-03-30 ENCOUNTER — Encounter: Payer: Self-pay | Admitting: Family Medicine

## 2019-03-30 ENCOUNTER — Ambulatory Visit (INDEPENDENT_AMBULATORY_CARE_PROVIDER_SITE_OTHER): Admitting: Family Medicine

## 2019-03-30 ENCOUNTER — Other Ambulatory Visit: Payer: Self-pay

## 2019-03-30 ENCOUNTER — Other Ambulatory Visit

## 2019-03-30 VITALS — BP 112/64 | HR 96 | Temp 98.5°F | Resp 16 | Ht 66.0 in | Wt 208.0 lb

## 2019-03-30 DIAGNOSIS — M7989 Other specified soft tissue disorders: Secondary | ICD-10-CM | POA: Diagnosis not present

## 2019-03-30 DIAGNOSIS — K219 Gastro-esophageal reflux disease without esophagitis: Secondary | ICD-10-CM

## 2019-03-30 DIAGNOSIS — E119 Type 2 diabetes mellitus without complications: Secondary | ICD-10-CM

## 2019-03-30 DIAGNOSIS — I10 Essential (primary) hypertension: Secondary | ICD-10-CM | POA: Diagnosis not present

## 2019-03-30 DIAGNOSIS — E785 Hyperlipidemia, unspecified: Secondary | ICD-10-CM | POA: Diagnosis not present

## 2019-03-30 DIAGNOSIS — L309 Dermatitis, unspecified: Secondary | ICD-10-CM

## 2019-03-30 DIAGNOSIS — E663 Overweight: Secondary | ICD-10-CM | POA: Diagnosis not present

## 2019-03-30 LAB — CBC
HCT: 36.9 % (ref 36.0–46.0)
Hemoglobin: 11.4 g/dL — ABNORMAL LOW (ref 12.0–15.0)
MCHC: 31 g/dL (ref 30.0–36.0)
MCV: 70.2 fl — ABNORMAL LOW (ref 78.0–100.0)
Platelets: 341 10*3/uL (ref 150.0–400.0)
RBC: 5.26 Mil/uL — ABNORMAL HIGH (ref 3.87–5.11)
RDW: 18.2 % — ABNORMAL HIGH (ref 11.5–15.5)
WBC: 10.4 10*3/uL (ref 4.0–10.5)

## 2019-03-30 LAB — COMPREHENSIVE METABOLIC PANEL
ALT: 18 U/L (ref 0–35)
AST: 13 U/L (ref 0–37)
Albumin: 4.3 g/dL (ref 3.5–5.2)
Alkaline Phosphatase: 121 U/L — ABNORMAL HIGH (ref 39–117)
BUN: 17 mg/dL (ref 6–23)
CO2: 25 mEq/L (ref 19–32)
Calcium: 9.3 mg/dL (ref 8.4–10.5)
Chloride: 100 mEq/L (ref 96–112)
Creatinine, Ser: 0.67 mg/dL (ref 0.40–1.20)
GFR: 107.29 mL/min (ref >60.00)
Glucose, Bld: 231 mg/dL — ABNORMAL HIGH (ref 70–99)
Potassium: 4.3 mEq/L (ref 3.5–5.1)
Sodium: 134 mEq/L — ABNORMAL LOW (ref 135–145)
Total Bilirubin: 0.3 mg/dL (ref 0.2–1.2)
Total Protein: 7.2 g/dL (ref 6.0–8.3)

## 2019-03-30 LAB — LIPID PANEL
Cholesterol: 149 mg/dL (ref 0–200)
HDL: 42.2 mg/dL (ref >39.00)
LDL Cholesterol: 90 mg/dL (ref 0–99)
NonHDL: 107.25
Total CHOL/HDL Ratio: 4
Triglycerides: 85 mg/dL (ref 0.0–149.0)
VLDL: 17 mg/dL (ref 0.0–40.0)

## 2019-03-30 LAB — MICROALBUMIN / CREATININE URINE RATIO
Creatinine,U: 69.4 mg/dL
Microalb Creat Ratio: 6.9 mg/g (ref 0.0–30.0)
Microalb, Ur: 4.8 mg/dL — ABNORMAL HIGH (ref 0.0–1.9)

## 2019-03-30 LAB — BRAIN NATRIURETIC PEPTIDE: Pro B Natriuretic peptide (BNP): 7 pg/mL (ref 0.0–100.0)

## 2019-03-30 MED ORDER — TRIAMCINOLONE ACETONIDE 0.1 % EX CREA: 1.0000 "application " | TOPICAL_CREAM | Freq: Two times a day (BID) | CUTANEOUS | 1 refills | Status: DC

## 2019-03-31 ENCOUNTER — Encounter: Payer: Self-pay | Admitting: Gastroenterology

## 2019-03-31 ENCOUNTER — Encounter: Payer: Self-pay | Admitting: Family Medicine

## 2019-03-31 LAB — HEMOGLOBIN A1C
Hgb A1c MFr Bld: 9.1 % of total Hgb — ABNORMAL HIGH (ref <5.7)
Mean Plasma Glucose: 214 (calc)
eAG (mmol/L): 11.9 (calc)

## 2019-04-01 ENCOUNTER — Encounter: Payer: Self-pay | Admitting: Family Medicine

## 2019-04-04 MED ORDER — CANAGLIFLOZIN 100 MG PO TABS: 100.0000 mg | ORAL_TABLET | Freq: Every day | ORAL | 11 refills | Status: DC

## 2019-05-09 ENCOUNTER — Encounter: Payer: Self-pay | Admitting: Gastroenterology

## 2019-05-09 ENCOUNTER — Other Ambulatory Visit: Payer: Self-pay

## 2019-05-09 ENCOUNTER — Ambulatory Visit (INDEPENDENT_AMBULATORY_CARE_PROVIDER_SITE_OTHER): Admitting: Gastroenterology

## 2019-05-09 VITALS — BP 136/78 | HR 64 | Temp 98.3°F | Ht 64.0 in | Wt 206.3 lb

## 2019-05-09 DIAGNOSIS — K9089 Other intestinal malabsorption: Secondary | ICD-10-CM

## 2019-05-09 DIAGNOSIS — Z800 Family history of malignant neoplasm of digestive organs: Secondary | ICD-10-CM

## 2019-05-09 DIAGNOSIS — K219 Gastro-esophageal reflux disease without esophagitis: Secondary | ICD-10-CM

## 2019-05-09 DIAGNOSIS — R1013 Epigastric pain: Secondary | ICD-10-CM | POA: Diagnosis not present

## 2019-05-09 MED ORDER — PANTOPRAZOLE SODIUM 40 MG PO TBEC: 40.0000 mg | DELAYED_RELEASE_TABLET | Freq: Two times a day (BID) | ORAL | 6 refills | Status: DC

## 2019-05-09 NOTE — Progress Notes (Signed)
Bailey [name]    [id number]    04/03/1956  Primary Care Physician:Copland, Gay Filler, MD  Referring Physician: Darreld Mclean, MD [address] [place],  [place] [address]   Chief complaint: GERD HPI: 63 year old female with obesity, diabetes, status post cholecystectomy complains of worsening heartburn.  She was last seen in February 2017  She takes naproxen occasionally and also intermittent Ibuprofen.   Sister had stomach cancer in her 16's and brother diagnosed with pancreatic cancer  She takes Protonix 20m daily with breakthrough heartburn intermittently mostly when she lays down immediately after eating, is difficult with her work schedule as she works different shifts.  Diarrhea better controlled with cholestyramine.  Denies any melena or blood per rectum.  No decreased appetite or weight loss.  colonoscopy November 07, 2015 showed colonic diverticulosis, random colon biopsies negative for microscopic colitis otherwise unremarkable exam    Outpatient Encounter Medications as of 05/09/2019  Medication Sig  . acetaminophen (TYLENOL) 500 MG tablet Take 1,000 mg by mouth every 6 (six) hours as needed (pain). Reported on 04/08/2016  . atorvastatin (LIPITOR) 40 MG tablet Take 1 tablet by mouth once daily  . blood glucose meter kit and supplies KIT Dispense based on patient and insurance preference. Use up to four times daily as directed. (FOR ICD-9 250.00, 250.01).  . cholestyramine (QUESTRAN) 4 g packet Take 1 packet (4 g total) by mouth 2 (two) times daily.  .Marland Kitchenglimepiride (AMARYL) 4 MG tablet TAKE 2 TABLETS BY MOUTH ONCE DAILY WITH BREAKFAST  . glucose blood (ONE TOUCH ULTRA TEST) test strip USE ONE STRIP TO CHECK GLUCOSE ONCE DAILY  . hydrochlorothiazide (MICROZIDE) 12.5 MG capsule Take 1 capsule by mouth once daily  . Lancets MISC Pt uses One Touch.  Test 1 or 2 times day  . lisinopril (PRINIVIL,ZESTRIL) 20 MG tablet TAKE 1 TABLET BY  MOUTH ONCE DAILY  . meloxicam (MOBIC) 15 MG tablet TAKE ONE TABLET BY MOUTH DAILY.   USE  AS  NEEDED  FOR  KNEE  AND  BACK  PAIN  . metFORMIN (GLUCOPHAGE) 1000 MG tablet Take 1 tablet (1,000 mg total) by mouth 2 (two) times daily with a meal.  . naproxen (NAPROSYN) 500 MG tablet TAKE 1 TABLET BY MOUTH TWICE DAILY WITH A MEAL.  . pantoprazole (PROTONIX) 40 MG tablet TAKE 1 TABLET BY MOUTH ONCE DAILY  . triamcinolone cream (KENALOG) 0.1 % Apply 1 application topically 2 (two) times daily. Use as needed on dry or scaly areas  . canagliflozin (INVOKANA) 100 MG TABS tablet Take 1 tablet (100 mg total) by mouth daily before breakfast. (Patient not taking: Reported on 05/09/2019)  . fluticasone (FLONASE) 50 MCG/ACT nasal spray Place 2 sprays into both nostrils daily.   No facility-administered encounter medications on file as of 05/09/2019.     Allergies as of 05/09/2019  . (No Known Allergies)    Past Medical History:  Diagnosis Date  . Arthritis   . Diabetes mellitus without complication ([place]   . Fibrocystic breast disease July 2016  . Hypertension     Past Surgical History:  Procedure Laterality Date  . BREAST BIOPSY Right 04/19/2015   benign  . CHOLECYSTECTOMY    . UTERINE FIBROID SURGERY      Family History  Problem Relation Age of Onset  . Diabetes Mother   . Stroke Mother   . Dementia Mother   . Heart disease Father   .  Diabetes Father   . Diabetes Brother   . Dementia Brother   . Renal Disease Brother   . Diabetes Sister   . Narcolepsy Sister   . Arthritis Sister   . Diabetes Sister   . Obesity Sister   . Diabetes Brother   . Heart disease Brother   . Cancer Brother        pancreatic  . Diabetes Brother   . Diabetes Brother   . Colon cancer Neg Hx   . Esophageal cancer Neg Hx   . Stomach cancer Neg Hx   . Rectal cancer Neg Hx     Social History   Socioeconomic History  . Marital status: Widowed    Spouse name: Not on file  . Number of children: 2  .  Years of education: Not on file  . Highest education level: Not on file  Occupational History  . Occupation: Firefighter Needs  . Financial resource strain: Not on file  . Food insecurity    Worry: Not on file    Inability: Not on file  . Transportation needs    Medical: Not on file    Non-medical: Not on file  Tobacco Use  . Smoking status: Current Every Day Smoker    Packs/day: 1.00    Years: 20.00    Pack years: 20.00    Types: Cigarettes  . Smokeless tobacco: Never Used  . Tobacco comment: Tobacco info given 10/11/15  Substance and Sexual Activity  . Alcohol use: No    Alcohol/week: 0.0 standard drinks  . Drug use: No  . Sexual activity: Never    Birth control/protection: None  Lifestyle  . Physical activity    Days per week: Not on file    Minutes per session: Not on file  . Stress: Not on file  Relationships  . Social Herbalist on phone: Not on file    Gets together: Not on file    Attends religious service: Not on file    Active member of club or organization: Not on file    Attends meetings of clubs or organizations: Not on file    Relationship status: Not on file  . Intimate partner violence    Fear of current or ex partner: Not on file    Emotionally abused: Not on file    Physically abused: Not on file    Forced sexual activity: Not on file  Other Topics Concern  . Not on file  Social History Narrative  . Not on file      Review of systems: Review of Systems  Constitutional: Negative for fever and chills.  HENT: Negative.   Eyes: Negative for blurred vision.  Respiratory: Negative for cough, shortness of breath and wheezing.   Cardiovascular: Negative for chest pain and palpitations.  Gastrointestinal: as per HPI Genitourinary: Negative for dysuria, urgency, frequency and hematuria.  Musculoskeletal: Positive for myalgias, back pain and joint pain.  Skin: Positive for itching and rash.  Neurological: Negative for  dizziness, tremors, focal weakness, seizures and loss of consciousness.  Endo/Heme/Allergies: Negative Psychiatric/Behavioral: Negative for depression, suicidal ideas and hallucinations.  All other systems reviewed and are negative.   Physical Exam: Vitals:   05/09/19 0835  BP: 136/78  Pulse: 64  Temp: 98.3 F (36.8 C)   Body mass index is 35.41 kg/m. Gen:      No acute distress HEENT:  EOMI, sclera anicteric Neck:     No masses; no thyromegaly  Lungs:    Clear to auscultation bilaterally; normal respiratory effort CV:         Regular rate and rhythm; no murmurs Abd:      + bowel sounds; soft, non-tender; no palpable masses, no distension Ext:    No edema; adequate peripheral perfusion Skin:      Warm and dry; no rash Neuro: alert and oriented x 3 Psych: normal mood and affect  Data Reviewed:  Reviewed labs, radiology imaging, old records and pertinent past GI work up   Assessment and Plan/Recommendations:  63 year old with history of hypertension, diabetes, hyperlipidemia, fatty liver, status post cholecystectomy, bile salt induced diarrhea and GERD, persistent breakthrough reflux symptoms despite PPI Schedule for EGD to exclude esophagitis and also check for H. Pylori She also has family history of gastric cancer  Continue PPI Discussed potential side effects with long-term PPI use Discussed antireflux measures and lifestyle modifications in detail Avoid or limit use of NSAIDs  Bile salt induced diarrhea: Continue cholestyramine  Due for colorectal cancer screening 2027   The risks and benefits as well as alternatives of endoscopic procedure(s) have been discussed and reviewed. All questions answered. The patient agrees to proceed.   Damaris Hippo , MD    CC: Copland, Gay Filler, MD

## 2019-05-09 NOTE — Patient Instructions (Addendum)
You have been scheduled for an endoscopy. Please follow written instructions given to you at your visit today. If you use inhalers (even only as needed), please bring them with you on the day of your procedure.  We have increased your Protonix to twice daily  No Metformin the morning of your procedure    Gastroesophageal Reflux Disease, Adult Gastroesophageal reflux (GER) happens when acid from the stomach flows up into the tube that connects the mouth and the stomach (esophagus). Normally, food travels down the esophagus and stays in the stomach to be digested. However, when a person has GER, food and stomach acid sometimes move back up into the esophagus. If this becomes a more serious problem, the person may be diagnosed with a disease called gastroesophageal reflux disease (GERD). GERD occurs when the reflux:  Happens often.  Causes frequent or severe symptoms.  Causes problems such as damage to the esophagus. When stomach acid comes in contact with the esophagus, the acid may cause soreness (inflammation) in the esophagus. Over time, GERD may create small holes (ulcers) in the lining of the esophagus. What are the causes? This condition is caused by a problem with the muscle between the esophagus and the stomach (lower esophageal sphincter, or LES). Normally, the LES muscle closes after food passes through the esophagus to the stomach. When the LES is weakened or abnormal, it does not close properly, and that allows food and stomach acid to go back up into the esophagus. The LES can be weakened by certain dietary substances, medicines, and medical conditions, including:  Tobacco use.  Pregnancy.  Having a hiatal hernia.  Alcohol use.  Certain foods and beverages, such as coffee, chocolate, onions, and peppermint. What increases the risk? You are more likely to develop this condition if you:  Have an increased body weight.  Have a connective tissue disorder.  Use NSAID  medicines. What are the signs or symptoms? Symptoms of this condition include:  Heartburn.  Difficult or painful swallowing.  The feeling of having a lump in the throat.  Abitter taste in the mouth.  Bad breath.  Having a large amount of saliva.  Having an upset or bloated stomach.  Belching.  Chest pain. Different conditions can cause chest pain. Make sure you see your health care provider if you experience chest pain.  Shortness of breath or wheezing.  Ongoing (chronic) cough or a night-time cough.  Wearing away of tooth enamel.  Weight loss. How is this diagnosed? Your health care provider will take a medical history and perform a physical exam. To determine if you have mild or severe GERD, your health care provider may also monitor how you respond to treatment. You may also have tests, including:  A test to examine your stomach and esophagus with a small camera (endoscopy).  A test thatmeasures the acidity level in your esophagus.  A test thatmeasures how much pressure is on your esophagus.  A barium swallow or modified barium swallow test to show the shape, size, and functioning of your esophagus. How is this treated? The goal of treatment is to help relieve your symptoms and to prevent complications. Treatment for this condition may vary depending on how severe your symptoms are. Your health care provider may recommend:  Changes to your diet.  Medicine.  Surgery. Follow these instructions at home: Eating and drinking   Follow a diet as recommended by your health care provider. This may involve avoiding foods and drinks such as: ? Coffee and tea (  with or without caffeine). ? Drinks that containalcohol. ? Energy drinks and sports drinks. ? Carbonated drinks or sodas. ? Chocolate and cocoa. ? Peppermint and mint flavorings. ? Garlic and onions. ? Horseradish. ? Spicy and acidic foods, including peppers, chili powder, curry powder, vinegar, hot  sauces, and barbecue sauce. ? Citrus fruit juices and citrus fruits, such as oranges, lemons, and limes. ? Tomato-based foods, such as red sauce, chili, salsa, and pizza with red sauce. ? Fried and fatty foods, such as donuts, french fries, potato chips, and high-fat dressings. ? High-fat meats, such as hot dogs and fatty cuts of red and white meats, such as rib eye steak, sausage, ham, and bacon. ? High-fat dairy items, such as whole milk, butter, and cream cheese.  Eat small, frequent meals instead of large meals.  Avoid drinking large amounts of liquid with your meals.  Avoid eating meals during the 2-3 hours before bedtime.  Avoid lying down right after you eat.  Do not exercise right after you eat. Lifestyle   Do not use any products that contain nicotine or tobacco, such as cigarettes, e-cigarettes, and chewing tobacco. If you need help quitting, ask your health care provider.  Try to reduce your stress by using methods such as yoga or meditation. If you need help reducing stress, ask your health care provider.  If you are overweight, reduce your weight to an amount that is healthy for you. Ask your health care provider for guidance about a safe weight loss goal. General instructions  Pay attention to any changes in your symptoms.  Take over-the-counter and prescription medicines only as told by your health care provider. Do not take aspirin, ibuprofen, or other NSAIDs unless your health care provider told you to do so.  Wear loose-fitting clothing. Do not wear anything tight around your waist that causes pressure on your abdomen.  Raise (elevate) the head of your bed about 6 inches (15 cm).  Avoid bending over if this makes your symptoms worse.  Keep all follow-up visits as told by your health care provider. This is important. Contact a health care provider if:  You have: ? New symptoms. ? Unexplained weight loss. ? Difficulty swallowing or it hurts to swallow. ?  Wheezing or a persistent cough. ? A hoarse voice.  Your symptoms do not improve with treatment. Get help right away if you:  Have pain in your arms, neck, jaw, teeth, or back.  Feel sweaty, dizzy, or light-headed.  Have chest pain or shortness of breath.  Vomit and your vomit looks like blood or coffee grounds.  Faint.  Have stool that is bloody or black.  Cannot swallow, drink, or eat. Summary  Gastroesophageal reflux happens when acid from the stomach flows up into the esophagus. GERD is a disease in which the reflux happens often, causes frequent or severe symptoms, or causes problems such as damage to the esophagus.  Treatment for this condition may vary depending on how severe your symptoms are. Your health care provider may recommend diet and lifestyle changes, medicine, or surgery.  Contact a health care provider if you have new or worsening symptoms.  Take over-the-counter and prescription medicines only as told by your health care provider. Do not take aspirin, ibuprofen, or other NSAIDs unless your health care provider told you to do so.  Keep all follow-up visits as told by your health care provider. This is important. This information is not intended to replace advice given to you by your health care  provider. Make sure you discuss any questions you have with your health care provider. Document Released: 06/17/2005 Document Revised: 03/16/2018 Document Reviewed: 03/16/2018 Elsevier Patient Education  [place].   I appreciate the  opportunity to care for you  Thank You   Harl Bowie , MD

## 2019-05-10 ENCOUNTER — Telehealth: Payer: Self-pay | Admitting: Gastroenterology

## 2019-05-10 NOTE — Telephone Encounter (Signed)

## 2019-05-11 ENCOUNTER — Other Ambulatory Visit: Payer: Self-pay

## 2019-05-11 ENCOUNTER — Encounter: Payer: Self-pay | Admitting: Gastroenterology

## 2019-05-11 ENCOUNTER — Ambulatory Visit (AMBULATORY_SURGERY_CENTER): Admitting: Gastroenterology

## 2019-05-11 VITALS — BP 132/75 | HR 86 | Temp 99.6°F | Resp 16 | Ht 64.0 in | Wt 206.0 lb

## 2019-05-11 DIAGNOSIS — K3189 Other diseases of stomach and duodenum: Secondary | ICD-10-CM | POA: Diagnosis not present

## 2019-05-11 DIAGNOSIS — K219 Gastro-esophageal reflux disease without esophagitis: Secondary | ICD-10-CM | POA: Diagnosis present

## 2019-05-11 DIAGNOSIS — Z800 Family history of malignant neoplasm of digestive organs: Secondary | ICD-10-CM

## 2019-05-11 DIAGNOSIS — K259 Gastric ulcer, unspecified as acute or chronic, without hemorrhage or perforation: Secondary | ICD-10-CM | POA: Diagnosis not present

## 2019-05-11 MED ORDER — SODIUM CHLORIDE 0.9 % IV SOLN: 500.0000 mL | Freq: Once | INTRAVENOUS | Status: DC

## 2019-05-11 NOTE — Patient Instructions (Signed)
Please read handouts provided. Continue present medications. No aspirin, ibuprofen, naproxen, or other non-steriodal anti-inflammatory drugs. Follow-up office visit at next available appointment. Await pathology results.      YOU HAD AN ENDOSCOPIC PROCEDURE TODAY AT [facility]:   Refer to the procedure report that was given to you for any specific questions about what was found during the examination.  If the procedure report does not answer your questions, please call your gastroenterologist to clarify.  If you requested that your care partner not be given the details of your procedure findings, then the procedure report has been included in a sealed envelope for you to review at your convenience later.  YOU SHOULD EXPECT: Some feelings of bloating in the abdomen. Passage of more gas than usual.  Walking can help get rid of the air that was put into your GI tract during the procedure and reduce the bloating. If you had a lower endoscopy (such as a colonoscopy or flexible sigmoidoscopy) you may notice spotting of blood in your stool or on the toilet paper. If you underwent a bowel prep for your procedure, you may not have a normal bowel movement for a few days.  Please Note:  You might notice some irritation and congestion in your nose or some drainage.  This is from the oxygen used during your procedure.  There is no need for concern and it should clear up in a day or so.  SYMPTOMS TO REPORT IMMEDIATELY:    Following upper endoscopy (EGD)  Vomiting of blood or coffee ground material  New chest pain or pain under the shoulder blades  Painful or persistently difficult swallowing  New shortness of breath  Fever of 100F or higher  Black, tarry-looking stools  For urgent or emergent issues, a gastroenterologist can be reached at any hour by calling [phone].   DIET:  We do recommend a small meal at first, but then you may proceed to your regular diet.  Drink plenty  of fluids but you should avoid alcoholic beverages for 24 hours.  ACTIVITY:  You should plan to take it easy for the rest of today and you should NOT DRIVE or use heavy machinery until tomorrow (because of the sedation medicines used during the test).    FOLLOW UP: Our staff will call the number listed on your records 48-72 hours following your procedure to check on you and address any questions or concerns that you may have regarding the information given to you following your procedure. If we do not reach you, we will leave a message.  We will attempt to reach you two times.  During this call, we will ask if you have developed any symptoms of COVID 19. If you develop any symptoms (ie: fever, flu-like symptoms, shortness of breath, cough etc.) before then, please call [phone].  If you test positive for Covid 19 in the 2 weeks post procedure, please call and report this information to [place].    If any biopsies were taken you will be contacted by phone or by letter within the next 1-3 weeks.  Please call us at [phone] if you have not heard about the biopsies in 3 weeks.    SIGNATURES/CONFIDENTIALITY: You and/or your care partner have signed paperwork which will be entered into your electronic medical record.  These signatures attest to the fact that that the information above on your After Visit Summary has been reviewed and is understood.  Full responsibility of the confidentiality of  this discharge information lies with you and/or your care-partner.

## 2019-05-11 NOTE — Op Note (Signed)
[place] Patient Name: Bailey [name] Procedure Date: 05/11/2019 9:35 AM MRN: [id number] Endoscopist: Mauri Pole , MD Age: 63 Referring MD:  Date of Birth: Dec 09, 1955 Gender: Female Account #: [id number] Procedure:                Upper GI endoscopy Indications:              Epigastric abdominal pain, Dyspepsia, Esophageal                            reflux symptoms that persist despite appropriate                            therapy Medicines:                Monitored Anesthesia Care Procedure:                Pre-Anesthesia Assessment:                           - Prior to the procedure, a History and Physical                            was performed, and patient medications and                            allergies were reviewed. The patient's tolerance of                            previous anesthesia was also reviewed. The risks                            and benefits of the procedure and the sedation                            options and risks were discussed with the patient.                            All questions were answered, and informed consent                            was obtained. Prior Anticoagulants: The patient has                            taken no previous anticoagulant or antiplatelet                            agents. ASA Grade Assessment: II - A patient with                            mild systemic disease. After reviewing the risks                            and benefits, the patient was deemed in  satisfactory condition to undergo the procedure.                           After obtaining informed consent, the endoscope was                            passed under direct vision. Throughout the                            procedure, the patient's blood pressure, pulse, and                            oxygen saturations were monitored continuously. The                            Endoscope was introduced through the  mouth, and                            advanced to the second part of duodenum. The upper                            GI endoscopy was accomplished without difficulty.                            The patient tolerated the procedure well. Scope In: Scope Out: Findings:                 The Z-line was regular and was found 36 cm from the                            incisors.                           No gross lesions were noted in the entire esophagus.                           Localized nodular ulcerated mucosa with adherent                            mucus was found in the gastric antrum. Biopsies                            were taken with a cold forceps for histology.                            Biopsies were taken with a cold forceps for                            Helicobacter pylori testing.                           The cardia and gastric fundus were normal on                            retroflexion.  The examined duodenum was normal. Complications:            No immediate complications. Estimated Blood Loss:     Estimated blood loss was minimal. Impression:               - Z-line regular, 36 cm from the incisors.                           - No gross lesions in esophagus.                           - Nodular mucosa in the gastric antrum. Biopsied.                           - Normal examined duodenum. Recommendation:           - Patient has a contact number available for                            emergencies. The signs and symptoms of potential                            delayed complications were discussed with the                            patient. Return to normal activities tomorrow.                            Written discharge instructions were provided to the                            patient.                           - Resume previous diet.                           - Continue present medications.                           - Await pathology results.                            - No aspirin, ibuprofen, naproxen, or other                            non-steroidal anti-inflammatory drugs.                           - Return to my office at the next available                            appointment. Mauri Pole, MD 05/11/2019 10:05:34 AM This report has been signed electronically.

## 2019-05-11 NOTE — Progress Notes (Signed)
Report given to PACU, vss 

## 2019-05-11 NOTE — Progress Notes (Signed)
Called to room to assist during endoscopic procedure.  Patient ID and intended procedure confirmed with present staff. Received instructions for my participation in the procedure from the performing physician.  

## 2019-05-11 NOTE — Progress Notes (Signed)
Temperature- June Shoreline RN

## 2019-05-15 ENCOUNTER — Telehealth: Payer: Self-pay | Admitting: *Deleted

## 2019-05-15 NOTE — Telephone Encounter (Signed)
  Follow up Call-  Call back number 05/11/2019  Post procedure Call Back phone  # [phone]  Permission to leave phone message Yes  Some recent data might be hidden     Patient questions:  Do you have a fever, pain , or abdominal swelling? No. Pain Score  0 *  Have you tolerated food without any problems? Yes.    Have you been able to return to your normal activities? Yes.    Do you have any questions about your discharge instructions: Diet   No. Medications  No. Follow up visit  No.  Do you have questions or concerns about your Care? No.  Actions: * If pain score is 4 or above: No action needed, pain <4.  1. Have you developed a fever since your procedure? no  2.   Have you had an respiratory symptoms (SOB or cough) since your procedure? no  3.   Have you tested positive for COVID 19 since your procedure no  4.   Have you had any family members/close contacts diagnosed with the COVID 19 since your procedure?  no   If yes to any of these questions please route to Joylene John, RN and Alphonsa Gin, Therapist, sports.

## 2019-05-19 ENCOUNTER — Encounter: Payer: Self-pay | Admitting: Gastroenterology

## 2019-06-02 ENCOUNTER — Telehealth: Payer: Self-pay | Admitting: Gastroenterology

## 2019-06-02 NOTE — Telephone Encounter (Signed)
Called pharmacy we had already sent in patients new rx for protonix on 8/18 with 6 refills  Patient claims she picked up rx and it was only for once daily with only 30 pills

## 2019-06-02 NOTE — Telephone Encounter (Signed)
Pt needs a script for her Pantoprazole

## 2019-06-07 ENCOUNTER — Other Ambulatory Visit: Payer: Self-pay | Admitting: Family Medicine

## 2019-06-07 DIAGNOSIS — I10 Essential (primary) hypertension: Secondary | ICD-10-CM

## 2019-06-07 DIAGNOSIS — E119 Type 2 diabetes mellitus without complications: Secondary | ICD-10-CM

## 2019-06-07 DIAGNOSIS — E785 Hyperlipidemia, unspecified: Secondary | ICD-10-CM

## 2019-07-19 NOTE — Patient Instructions (Addendum)
It was great to see you again today, I will be in touch with your labs asap Flu shot today Please keep an eye on your BP at home- goal is less than about 135/85 If your A1c is still high we can adjust your medication

## 2019-07-19 NOTE — Progress Notes (Addendum)
[facility] at [facility] [place], [place], [place] [address] [phone]  Date:  07/24/2019   Name:  Bailey [name]   DOB:  18-Jun-1956   MRN:  [id number]  PCP:  Bailey Mclean, MD    Chief Complaint: Diabetes (3 month follow up, flu shot) and Immunizations ([place] vaccine? , flu shot today)   History of Present Illness:  Bailey [name] is a 63 y.o. very pleasant female patient who presents with the following:  Here today for periodic in office follow-up visit History of diabetes, hypertension, hyperlipidemia, diabetic retinopathy, fatty liver, COPD Last seen by myself in July of this year  Foot exam due-we will perform today Flu shot- give today Eye exam- she went last week to her retinal specialist, reports all looked all right Mammogram due next month- she will set this up  Pap okay Colonoscopy okay Can suggest Shingrix- however she had zostavax in 2016 so too early Most recent labs in July-time she had mild anemia, I encouraged her to start an iron supplement Her A1c was high, I called in [place].  However this is expensive and she want to try working on her diet first.  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 Diabetic retinopathy ([place]) 02/20/2017  . Fatty liver 11/24/2016  . GERD (gastroesophageal reflux disease) 07/26/2015  . Hyperlipidemia 07/26/2015  . Dyspepsia 07/26/2015  . Essential hypertension 07/26/2015  . Fibrocystic breast disease 03/22/2015  . Type 2 diabetes mellitus treated without insulin ([place]) 02/16/2013    Past Medical History:  Diagnosis Date  . Arthritis   . Cataract   . COPD (chronic obstructive pulmonary disease) ([place])   . Diabetes mellitus without complication ([place])   . Fibrocystic breast disease July 2016  . GERD (gastroesophageal reflux disease)   . Hyperlipidemia   . Hypertension     Past Surgical History:  Procedure Laterality Date  . BREAST BIOPSY Right 04/19/2015   benign  . CATARACT EXTRACTION    . CHOLECYSTECTOMY    . COLONOSCOPY    . UTERINE FIBROID SURGERY      Social History   Tobacco Use  . Smoking status: Current Every Day Smoker    Packs/day: 1.00    Years: 20.00    Pack years: 20.00    Types: Cigarettes  . Smokeless tobacco: Never Used  . Tobacco comment: Tobacco info given 10/11/15  Substance Use Topics  . Alcohol use: Not on file    Comment: rare use  . Drug use: No    Family History  Problem Relation Age of Onset  . Diabetes Mother   .  Stroke Mother   . Dementia Mother   . Heart disease Father   . Diabetes Father   . Diabetes Brother   . Dementia Brother   . Renal Disease Brother   . Diabetes Sister   . Narcolepsy Sister   . Arthritis Sister   . Diabetes Sister   . Obesity Sister   . Diabetes Brother   . Heart disease Brother   . Cancer Brother        pancreatic  . Diabetes Brother   . Diabetes Brother   . Stomach cancer Sister   . Colon cancer Neg Hx   . Esophageal cancer Neg Hx   . Rectal cancer Neg Hx     No Known Allergies  Medication list has been reviewed and updated.  Current Outpatient Medications on File Prior to Visit  Medication Sig Dispense Refill  . acetaminophen (TYLENOL) 500 MG  tablet Take 1,000 mg by mouth every 6 (six) hours as needed (pain). Reported on 04/08/2016    . atorvastatin (LIPITOR) 40 MG tablet Take 1 tablet by mouth once daily 90 tablet 1  . blood glucose meter kit and supplies KIT Dispense based on patient and insurance preference. Use up to four times daily as directed. (FOR ICD-9 250.00, 250.01). 1 each 0  . cholestyramine (QUESTRAN) 4 g packet Take 1 packet (4 g total) by mouth 2 (two) times daily. 60 each 3  . fluticasone (FLONASE) 50 MCG/ACT nasal spray Place 2 sprays into both nostrils daily. 16 g 0  . glimepiride (AMARYL) 4 MG tablet TAKE 2 TABLETS BY MOUTH ONCE DAILY WITH BREAKFAST 180 tablet 3  . hydrochlorothiazide (MICROZIDE) 12.5 MG capsule Take 1 capsule by mouth once daily 90 capsule 1  . Lancets MISC Pt uses One Touch.  Test 1 or 2 times day 100 each 1  . lisinopril (PRINIVIL,ZESTRIL) 20 MG tablet TAKE 1 TABLET BY MOUTH ONCE DAILY 90 tablet 3  . meloxicam (MOBIC) 15 MG tablet TAKE ONE TABLET BY MOUTH DAILY.   USE  AS  NEEDED  FOR  KNEE  AND  BACK  PAIN 30 tablet 2  . metFORMIN (GLUCOPHAGE) 1000 MG tablet Take 1 tablet (1,000 mg total) by mouth 2 (two) times daily with a meal. 180 tablet 3  . naproxen (NAPROSYN) 500 MG tablet TAKE 1 TABLET BY MOUTH TWICE DAILY WITH A MEAL. 60 tablet 2  . ONETOUCH ULTRA test strip USE 1 STRIP TO CHECK GLUCOSE ONCE DAILY 75 each 3  . pantoprazole (PROTONIX) 40 MG tablet Take 1 tablet (40 mg total) by mouth 2 (two) times daily. 60 tablet 6  . triamcinolone cream (KENALOG) 0.1 % Apply 1 application topically 2 (two) times daily. Use as needed on dry or scaly areas 60 g 1   No current facility-administered medications on file prior to visit.     Review of Systems:  As per HPI- otherwise negative.  BP Readings from Last 3 Encounters:  07/24/19 140/80  05/11/19 132/75  05/09/19 136/78   No chest pain or shortness of breath She does have a home blood pressure machine, though she has not been checking it  recently   Physical Examination: Vitals:   07/24/19 0909 07/24/19 0935  BP: (!) 148/82 140/80  Pulse: (!) 101 96  Resp: 17   Temp: (!) 96.7 F (35.9 C)   SpO2: 99%    Vitals:   07/24/19 0909  Weight: 207 lb (93.9 kg)  Height: 5' 4"  (1.626 m)   Body  mass index is 35.53 kg/m. Ideal Body Weight: Weight in (lb) to have BMI = 25: 145.3  GEN: WDWN, NAD, Non-toxic, A & O x 3, obese, looks well  HEENT: Atraumatic, Normocephalic. Neck supple. No masses, No LAD. Ears and Nose: No external deformity. CV: RRR, No M/G/R. No JVD. No thrill. No extra heart sounds. PULM: CTA B, no wheezes, crackles, rhonchi. No retractions. No resp. distress. No accessory muscle use. ABD: S, NT, ND, +BS. No rebound. No HSM. EXTR: No c/c/e NEURO Normal gait.  PSYCH: Normally interactive. Conversant. Not depressed or anxious appearing.  Calm demeanor.  Foot exam- normal today  She does complain of mild tingling of her feet off and on   Assessment and Plan: Controlled type 2 diabetes mellitus without complication, without long-term current use of insulin (HCC) - Plan: Hemoglobin A1c  Microcytic anemia - Plan: CBC, Ferritin  Elevated alkaline phosphatase level - Plan: Hepatic function panel, Gamma GT  Needs flu shot - Plan: Flu Vaccine QUAD 6+ mos PF IM (Fluarix Quad PF)  Here today for follow-up visit Flu shot given Recent alk phos minimally elevated, recheck today A1c has been high, she has try to work on diet and exercise.  If necessary will change her medication profile, with consideration of cost Blood pressure slightly elevated today, but she was up all night.  I advised her to monitor at home, she will let me know if not well controlled   Signed Lamar Blinks, MD   Received her labs 11/3, message to patient  Results for orders placed or performed in visit on 07/24/19  CBC  Result Value Ref Range   WBC 10.5 4.0 - 10.5 K/uL   RBC 5.30 (H) 3.87 - 5.11 Mil/uL   Platelets 330.0 150.0 -  400.0 K/uL   Hemoglobin 11.7 (L) 12.0 - 15.0 g/dL   HCT 37.3 36.0 - 46.0 %   MCV 70.3 (L) 78.0 - 100.0 fl   MCHC 31.5 30.0 - 36.0 g/dL   RDW 18.3 (H) 11.5 - 15.5 %  Hemoglobin A1c  Result Value Ref Range   Hgb A1c MFr Bld 10.7 (H) 4.6 - 6.5 %  Ferritin  Result Value Ref Range   Ferritin 15.1 10.0 - 291.0 ng/mL  Hepatic function panel  Result Value Ref Range   Total Bilirubin 0.2 0.2 - 1.2 mg/dL   Bilirubin, Direct 0.0 0.0 - 0.3 mg/dL   Alkaline Phosphatase 124 (H) 39 - 117 U/L   AST 15 0 - 37 U/L   ALT 25 0 - 35 U/L   Total Protein 6.9 6.0 - 8.3 g/dL   Albumin 4.2 3.5 - 5.2 g/dL  Gamma GT  Result Value Ref Range   GGT 95 (H) 7 - 51 U/L   Right upper quadrant ultrasound, 3/18: IMPRESSION: Increased hepatic echotexture most compatible with fatty infiltrative change. No focal hepatic abnormality. Normal appearance of the common bile duct.  Previous cholecystectomy.  Need to check AMA to complete alk phos work-up-we will ask lab if add-on is possible Minimal anemia, ferritin low side of normal.  Colonoscopy 2017.  Will recommend iron supplementation A1c higher  On max metformin and amaryl already Will ask pt to check with her insurance to see if any drugs from SGLT2 inhibitor or GLP-1 agonist are on her formulary     Your blood count showed minimal anemia, and your ferritin (iron) is on the low side.  Please start an over-the-counter iron supplement with 65 mg of iron once daily.  We can plan to recheck your level in about 3 months  Your alkaline phosphatase and GGT levels are slightly high.  I am going to ask the lab to add on one more test for your liver, to make sure all is well.  Will be in touch with this report.  Likely you just have some fat in your liver  Finally, your A1c is too high.  Would you please call your insurance provider and asking any drug from the SGLT2 inhibitor or GLP-1 agonist class is on your formulary?  I would like to add 1 of these medications,  but I am not sure what is covered for you  Thank you, take care

## 2019-07-21 ENCOUNTER — Other Ambulatory Visit: Payer: Self-pay

## 2019-07-24 ENCOUNTER — Encounter: Payer: Self-pay | Admitting: Family Medicine

## 2019-07-24 ENCOUNTER — Ambulatory Visit (INDEPENDENT_AMBULATORY_CARE_PROVIDER_SITE_OTHER): Admitting: Family Medicine

## 2019-07-24 ENCOUNTER — Other Ambulatory Visit: Payer: Self-pay

## 2019-07-24 VITALS — BP 140/80 | HR 96 | Temp 96.7°F | Resp 17 | Ht 64.0 in | Wt 207.0 lb

## 2019-07-24 DIAGNOSIS — E119 Type 2 diabetes mellitus without complications: Secondary | ICD-10-CM | POA: Diagnosis not present

## 2019-07-24 DIAGNOSIS — Z23 Encounter for immunization: Secondary | ICD-10-CM | POA: Diagnosis not present

## 2019-07-24 DIAGNOSIS — R748 Abnormal levels of other serum enzymes: Secondary | ICD-10-CM

## 2019-07-24 DIAGNOSIS — D509 Iron deficiency anemia, unspecified: Secondary | ICD-10-CM

## 2019-07-24 LAB — HEPATIC FUNCTION PANEL
ALT: 25 U/L (ref 0–35)
AST: 15 U/L (ref 0–37)
Albumin: 4.2 g/dL (ref 3.5–5.2)
Alkaline Phosphatase: 124 U/L — ABNORMAL HIGH (ref 39–117)
Bilirubin, Direct: 0 mg/dL (ref 0.0–0.3)
Total Bilirubin: 0.2 mg/dL (ref 0.2–1.2)
Total Protein: 6.9 g/dL (ref 6.0–8.3)

## 2019-07-24 LAB — CBC
HCT: 37.3 % (ref 36.0–46.0)
Hemoglobin: 11.7 g/dL — ABNORMAL LOW (ref 12.0–15.0)
MCHC: 31.5 g/dL (ref 30.0–36.0)
MCV: 70.3 fl — ABNORMAL LOW (ref 78.0–100.0)
Platelets: 330 10*3/uL (ref 150.0–400.0)
RBC: 5.3 Mil/uL — ABNORMAL HIGH (ref 3.87–5.11)
RDW: 18.3 % — ABNORMAL HIGH (ref 11.5–15.5)
WBC: 10.5 10*3/uL (ref 4.0–10.5)

## 2019-07-24 LAB — GAMMA GT: GGT: 95 U/L — ABNORMAL HIGH (ref 7–51)

## 2019-07-24 LAB — HEMOGLOBIN A1C: Hgb A1c MFr Bld: 10.7 % — ABNORMAL HIGH (ref 4.6–6.5)

## 2019-07-24 LAB — FERRITIN: Ferritin: 15.1 ng/mL (ref 10.0–291.0)

## 2019-07-25 ENCOUNTER — Other Ambulatory Visit: Payer: Self-pay | Admitting: Family Medicine

## 2019-07-25 ENCOUNTER — Encounter: Payer: Self-pay | Admitting: Family Medicine

## 2019-07-25 ENCOUNTER — Other Ambulatory Visit

## 2019-07-25 DIAGNOSIS — R748 Abnormal levels of other serum enzymes: Secondary | ICD-10-CM

## 2019-07-28 ENCOUNTER — Encounter: Payer: Self-pay | Admitting: Family Medicine

## 2019-07-28 LAB — MITOCHONDRIAL ANTIBODIES: Mitochondrial M2 Ab, IgG: <=20 U

## 2019-08-10 ENCOUNTER — Other Ambulatory Visit: Payer: Self-pay | Admitting: Family Medicine

## 2019-08-10 DIAGNOSIS — Z1231 Encounter for screening mammogram for malignant neoplasm of breast: Secondary | ICD-10-CM

## 2019-09-05 ENCOUNTER — Other Ambulatory Visit: Payer: Self-pay | Admitting: Family Medicine

## 2019-09-05 DIAGNOSIS — I10 Essential (primary) hypertension: Secondary | ICD-10-CM

## 2019-09-05 DIAGNOSIS — E119 Type 2 diabetes mellitus without complications: Secondary | ICD-10-CM

## 2019-10-06 ENCOUNTER — Ambulatory Visit
Admission: RE | Admit: 2019-10-06 | Discharge: 2019-10-06 | Disposition: A | Discharge status: Home / Self Care | Source: Ambulatory Visit | Attending: Family Medicine | Admitting: Family Medicine

## 2019-10-06 ENCOUNTER — Other Ambulatory Visit: Payer: Self-pay

## 2019-10-06 DIAGNOSIS — Z1231 Encounter for screening mammogram for malignant neoplasm of breast: Secondary | ICD-10-CM

## 2019-10-15 ENCOUNTER — Encounter: Payer: Self-pay | Admitting: Family Medicine

## 2019-10-28 ENCOUNTER — Encounter: Payer: Self-pay | Admitting: Family Medicine

## 2019-10-29 ENCOUNTER — Other Ambulatory Visit: Payer: Self-pay | Admitting: Family Medicine

## 2019-11-06 ENCOUNTER — Encounter: Payer: Self-pay | Admitting: Family Medicine

## 2019-11-07 ENCOUNTER — Other Ambulatory Visit: Payer: Self-pay

## 2019-11-08 ENCOUNTER — Encounter: Payer: Self-pay | Admitting: Family Medicine

## 2019-11-08 ENCOUNTER — Ambulatory Visit (INDEPENDENT_AMBULATORY_CARE_PROVIDER_SITE_OTHER): Admitting: Family Medicine

## 2019-11-08 ENCOUNTER — Other Ambulatory Visit: Payer: Self-pay

## 2019-11-08 VITALS — BP 140/82 | HR 95 | Temp 97.3°F | Resp 16 | Ht 64.0 in | Wt 203.0 lb

## 2019-11-08 DIAGNOSIS — R1011 Right upper quadrant pain: Secondary | ICD-10-CM | POA: Diagnosis not present

## 2019-11-08 DIAGNOSIS — D509 Iron deficiency anemia, unspecified: Secondary | ICD-10-CM

## 2019-11-08 LAB — COMPREHENSIVE METABOLIC PANEL
ALT: 21 U/L (ref 0–35)
AST: 11 U/L (ref 0–37)
Albumin: 4 g/dL (ref 3.5–5.2)
Alkaline Phosphatase: 116 U/L (ref 39–117)
BUN: 9 mg/dL (ref 6–23)
CO2: 28 mEq/L (ref 19–32)
Calcium: 9.8 mg/dL (ref 8.4–10.5)
Chloride: 102 mEq/L (ref 96–112)
Creatinine, Ser: 0.62 mg/dL (ref 0.40–1.20)
GFR: 117.11 mL/min (ref >60.00)
Glucose, Bld: 217 mg/dL — ABNORMAL HIGH (ref 70–99)
Potassium: 4.1 mEq/L (ref 3.5–5.1)
Sodium: 137 mEq/L (ref 135–145)
Total Bilirubin: 0.2 mg/dL (ref 0.2–1.2)
Total Protein: 6.7 g/dL (ref 6.0–8.3)

## 2019-11-08 LAB — CBC
HCT: 36.7 % (ref 36.0–46.0)
Hemoglobin: 11.6 g/dL — ABNORMAL LOW (ref 12.0–15.0)
MCHC: 31.6 g/dL (ref 30.0–36.0)
MCV: 69.3 fl — ABNORMAL LOW (ref 78.0–100.0)
Platelets: 317 10*3/uL (ref 150.0–400.0)
RBC: 5.29 Mil/uL — ABNORMAL HIGH (ref 3.87–5.11)
RDW: 18.2 % — ABNORMAL HIGH (ref 11.5–15.5)
WBC: 9.9 10*3/uL (ref 4.0–10.5)

## 2019-11-08 LAB — LIPASE: Lipase: 38 U/L (ref 11.0–59.0)

## 2019-11-08 NOTE — Progress Notes (Addendum)
[facility] at [facility] [place], [place],  [address] [phone]  Date:  11/08/2019   Name:  Bailey [name]   DOB:  09-Oct-1955   MRN:  [id number]  PCP:  Darreld Mclean, MD    Chief Complaint: Fatigue and Abdominal Pain (pancreatitis, cyst on kidneys, given antibiotic )   History of Present Illness:  Bailey [name] is a 64 y.o. 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 However, her antimitochondrial antibody level was negative, given mild elevation of alk phos likely not of concern  Lipitor 40 HCTZ 12.5 Lisinopril 20 Metformin 1000 twice daily Amaryl 8 mg daily  Lab Results  Component Value Date   HGBA1C 10.7 (H) 07/24/2019   She was seen at [facility] on 2/5 with concern of upper abd pain which she describes as "unbearable" She had labs and urine testing- was told that she had blood in her urine, otherwise she is not quite sure what her labs showed She was given cipro and flagyl as well as sucralfate and famotidine  She had a CT of her abdomen on 2/8 which was basically negative - left renal cyst, normal pancreas We do have a copy of her CT report  She is currently feeling better but not 100% She notes some right sided abd discomfort but not severe  Not vomiting She is continue to feel tired She has not needed to use medication she was given for nausea She is eating ok No fever Bowels are normal   No CP or SOB  Pulse Readings from  Last 3 Encounters:  11/08/19 95  07/24/19 96  05/11/19 86     Stress test 12/19  Nuclear stress EF: 52%. Visually, the LV EF appears to be greater than the 52% calculated by the computer. The EF appears to be normal .  There was no ST segment deviation noted during stress.  This is a low risk study. There is no evidence of ischemia and no evidence of previous infarction  The study is normal.  Patient Active Problem List   Diagnosis Date Noted  . Diabetic retinopathy ([place]) 02/20/2017  . Fatty liver 11/24/2016  . GERD (gastroesophageal reflux disease) 07/26/2015  . Hyperlipidemia 07/26/2015  . Dyspepsia 07/26/2015  . Essential hypertension 07/26/2015  . Fibrocystic breast disease 03/22/2015  . Type 2 diabetes mellitus treated without insulin ([place]) 02/16/2013    Past Medical History:  Diagnosis Date  . Arthritis   . Cataract   . COPD (chronic obstructive pulmonary disease) ([place])   . Diabetes mellitus without complication ([place])   . Fibrocystic breast disease July 2016  . GERD (gastroesophageal reflux disease)   . Hyperlipidemia   . Hypertension     Past Surgical History:  Procedure Laterality Date  . BREAST BIOPSY Right 04/19/2015   benign  . CATARACT EXTRACTION    . CHOLECYSTECTOMY    . COLONOSCOPY    . UTERINE FIBROID SURGERY      Social History   Tobacco Use  .  Smoking status: Current Every Day Smoker    Packs/day: 1.00    Years: 20.00    Pack years: 20.00    Types: Cigarettes  . Smokeless tobacco: Never Used  . Tobacco comment: Tobacco info given 10/11/15  Substance Use Topics  . Alcohol use: Not on file    Comment: rare use  . Drug use: No    Family History  Problem Relation Age of Onset  . Diabetes Mother   . Stroke Mother   . Dementia Mother   . Heart disease Father   . Diabetes Father   . Diabetes Brother   . Dementia Brother   . Renal Disease Brother   . Diabetes Sister   . Narcolepsy Sister   . Arthritis Sister   . Diabetes Sister    . Obesity Sister   . Diabetes Brother   . Heart disease Brother   . Cancer Brother        pancreatic  . Diabetes Brother   . Diabetes Brother   . Stomach cancer Sister   . Colon cancer Neg Hx   . Esophageal cancer Neg Hx   . Rectal cancer Neg Hx     No Known Allergies  Medication list has been reviewed and updated.  Current Outpatient Medications on File Prior to Visit  Medication Sig Dispense Refill  . acetaminophen (TYLENOL) 500 MG tablet Take 1,000 mg by mouth every 6 (six) hours as needed (pain). Reported on 04/08/2016    . atorvastatin (LIPITOR) 40 MG tablet Take 1 tablet by mouth once daily 90 tablet 1  . blood glucose meter kit and supplies KIT Dispense based on patient and insurance preference. Use up to four times daily as directed. (FOR ICD-9 250.00, 250.01). 1 each 0  . cholestyramine (QUESTRAN) 4 g packet DISSOLVE & TAKE 1 POWDER PACKET BY MOUTH TWICE DAILY 60 each 2  . ciprofloxacin (CIPRO) 250 MG tablet Take 250 mg by mouth 2 (two) times daily.    . famotidine (PEPCID) 20 MG tablet Take 20 mg by mouth 2 (two) times daily.    . fluticasone (FLONASE) 50 MCG/ACT nasal spray Place 2 sprays into both nostrils daily. 16 g 0  . glimepiride (AMARYL) 4 MG tablet TAKE 2 TABLETS BY MOUTH ONCE DAILY WITH BREAKFAST 180 tablet 0  . hydrochlorothiazide (MICROZIDE) 12.5 MG capsule Take 1 capsule by mouth once daily 90 capsule 1  . Lancets MISC Pt uses One Touch.  Test 1 or 2 times day 100 each 1  . lisinopril (ZESTRIL) 20 MG tablet Take 1 tablet by mouth once daily 90 tablet 0  . meloxicam (MOBIC) 15 MG tablet TAKE ONE TABLET BY MOUTH DAILY.   USE  AS  NEEDED  FOR  KNEE  AND  BACK  PAIN 30 tablet 2  . metFORMIN (GLUCOPHAGE) 1000 MG tablet Take 1 tablet (1,000 mg total) by mouth 2 (two) times daily with a meal. 180 tablet 3  . metroNIDAZOLE (FLAGYL) 500 MG tablet Take 500 mg by mouth 3 (three) times daily.    . naproxen (NAPROSYN) 500 MG tablet TAKE 1 TABLET BY MOUTH TWICE DAILY WITH A  MEAL. 60 tablet 2  . ONETOUCH ULTRA test strip USE 1 STRIP TO CHECK GLUCOSE ONCE DAILY 75 each 3  . pantoprazole (PROTONIX) 40 MG tablet Take 1 tablet (40 mg total) by mouth 2 (two) times daily. 60 tablet 6  . sucralfate (CARAFATE) 1 GM/10ML suspension Take 1 g by mouth 4 (four) times daily -  with meals and at bedtime.    . triamcinolone cream (KENALOG) 0.1 % Apply 1 application topically 2 (two) times daily. Use as needed on dry or scaly areas 60 g 1   No current facility-administered medications on file prior to visit.    Review of Systems:  As per HPI- otherwise negative. No fever or chills  Physical Examination: Vitals:   11/08/19 1324 11/08/19 1348  BP: 140/82   Pulse: (!) 107 95  Resp: 16   Temp: (!) 97.3 F (36.3 C)   SpO2: 98%    Vitals:   11/08/19 1324  Weight: 203 lb (92.1 kg)  Height: 5' 4"  (1.626 m)   Body mass index is 34.84 kg/m. Ideal Body Weight: Weight in (lb) to have BMI = 25: 145.3  GEN: no acute distress.  Obese, looks well.  Appears her normal self HEENT: Atraumatic, Normocephalic.  Ears and Nose: No external deformity. CV: RRR, No M/G/R. No JVD. No thrill. No extra heart sounds. PULM: CTA B, no wheezes, crackles, rhonchi. No retractions. No resp. distress. No accessory muscle use. ABD: S, NT, ND, +BS. No rebound. No HSM.  Abdomen is benign at this time EXTR: No c/c/e PSYCH: Normally interactive. Conversant.    Assessment and Plan: Right upper quadrant abdominal pain - Plan: CBC, Comprehensive metabolic panel, Hepatitis, Acute, Lipase  Microcytic anemia - Plan: Ferritin, Hemoglobinopathy Evaluation  Here today to follow-up abdominal pain, she was seen in outside clinic about 10 days ago for this issue.  She had a CT scan last week which was essentially normal.  She notes her pain is better, but not entirely resolved.  She feels tired and not quite herself.  We will check labs as above, continue to watch for labs from outside clinic which have  apparently been faxed. She has taken a few days off work so she can rest.  She will let me know if getting worse, or if any other concerns pending repeat labs Moderate medical decision making today This visit occurred during the SARS-CoV-2 public health emergency.  Safety protocols were in place, including screening questions prior to the visit, additional usage of staff PPE, and extensive cleaning of exam room while observing appropriate contact time as indicated for disinfecting solutions.    Signed Lamar Blinks, MD   addnd 2/18- received her labs as below, message to pt Order ferritin and hemoglobinopathy evaluation to eval microcytic anemia  Results for orders placed or performed in visit on 11/08/19  CBC  Result Value Ref Range   WBC 9.9 4.0 - 10.5 K/uL   RBC 5.29 (H) 3.87 - 5.11 Mil/uL   Platelets 317.0 150.0 - 400.0 K/uL   Hemoglobin 11.6 (L) 12.0 - 15.0 g/dL   HCT 36.7 36.0 - 46.0 %   MCV 69.3 Repeated and verified X2. (L) 78.0 - 100.0 fl   MCHC 31.6 30.0 - 36.0 g/dL   RDW 18.2 (H) 11.5 - 15.5 %  Comprehensive metabolic panel  Result Value Ref Range   Sodium 137 135 - 145 mEq/L   Potassium 4.1 3.5 - 5.1 mEq/L   Chloride 102 96 - 112 mEq/L   CO2 28 19 - 32 mEq/L   Glucose, Bld 217 (H) 70 - 99 mg/dL   BUN 9 6 - 23 mg/dL   Creatinine, Ser 0.62 0.40 - 1.20 mg/dL   Total Bilirubin 0.2 0.2 - 1.2 mg/dL   Alkaline Phosphatase 116 39 - 117 U/L   AST 11 0 - 37 U/L  ALT 21 0 - 35 U/L   Total Protein 6.7 6.0 - 8.3 g/dL   Albumin 4.0 3.5 - 5.2 g/dL   GFR 117.11 >60.00 mL/min   Calcium 9.8 8.4 - 10.5 mg/dL  Lipase  Result Value Ref Range   Lipase 38.0 11.0 - 59.0 U/L

## 2019-11-08 NOTE — Patient Instructions (Signed)
It was good to see you today-  I will be in touch with your labs asap Please let me know how you are feeling over the next couple of days I will watch for your labs from [facility] coming in soon

## 2019-11-09 ENCOUNTER — Encounter: Payer: Self-pay | Admitting: Family Medicine

## 2019-11-09 DIAGNOSIS — D562 Delta-beta thalassemia: Secondary | ICD-10-CM

## 2019-11-09 LAB — HEPATITIS PANEL, ACUTE
Hep A IgM: NONREACTIVE
Hep B C IgM: NONREACTIVE
Hepatitis B Surface Ag: NONREACTIVE
Hepatitis C Ab: NONREACTIVE
SIGNAL TO CUT-OFF: 0.01 (ref <1.00)

## 2019-11-09 NOTE — Addendum Note (Signed)
Addended by: Lamar Blinks C on: 11/09/2019 09:43 AM   Modules accepted: Orders

## 2019-11-10 NOTE — Telephone Encounter (Signed)
Pt scheduled for labs next week

## 2019-11-16 ENCOUNTER — Other Ambulatory Visit (INDEPENDENT_AMBULATORY_CARE_PROVIDER_SITE_OTHER)

## 2019-11-16 ENCOUNTER — Other Ambulatory Visit: Payer: Self-pay

## 2019-11-16 DIAGNOSIS — D509 Iron deficiency anemia, unspecified: Secondary | ICD-10-CM

## 2019-11-16 LAB — FERRITIN: Ferritin: 16.6 ng/mL (ref 10.0–291.0)

## 2019-11-22 DIAGNOSIS — D562 Delta-beta thalassemia: Secondary | ICD-10-CM | POA: Insufficient documentation

## 2019-11-22 LAB — HEMOGLOBINOPATHY EVALUATION
Fetal Hemoglobin Testing: 21.5 % — ABNORMAL HIGH (ref 0.0–1.9)
HCT: 39.4 % (ref 35.0–45.0)
Hemoglobin A2 - HGBRFX: 2.1 % (ref 1.8–3.5)
Hemoglobin: 12 g/dL (ref 11.7–15.5)
Hgb A: 76.4 % — ABNORMAL LOW (ref >96.0)
MCH: 22.3 pg — ABNORMAL LOW (ref 27.0–33.0)
MCV: 73.1 fL — ABNORMAL LOW (ref 80.0–100.0)
RBC: 5.39 10*6/uL — ABNORMAL HIGH (ref 3.80–5.10)
RDW: 17 % — ABNORMAL HIGH (ref 11.0–15.0)

## 2019-12-07 ENCOUNTER — Other Ambulatory Visit: Payer: Self-pay | Admitting: Family Medicine

## 2019-12-07 DIAGNOSIS — M542 Cervicalgia: Secondary | ICD-10-CM

## 2019-12-09 ENCOUNTER — Other Ambulatory Visit: Payer: Self-pay | Admitting: Family Medicine

## 2019-12-09 DIAGNOSIS — I10 Essential (primary) hypertension: Secondary | ICD-10-CM

## 2019-12-13 ENCOUNTER — Encounter: Payer: Self-pay | Admitting: Family Medicine

## 2019-12-23 ENCOUNTER — Other Ambulatory Visit: Payer: Self-pay | Admitting: Family Medicine

## 2019-12-23 DIAGNOSIS — E785 Hyperlipidemia, unspecified: Secondary | ICD-10-CM

## 2019-12-23 DIAGNOSIS — I10 Essential (primary) hypertension: Secondary | ICD-10-CM

## 2020-01-12 ENCOUNTER — Other Ambulatory Visit: Payer: Self-pay | Admitting: Family Medicine

## 2020-01-12 DIAGNOSIS — E119 Type 2 diabetes mellitus without complications: Secondary | ICD-10-CM

## 2020-01-27 ENCOUNTER — Other Ambulatory Visit: Payer: Self-pay | Admitting: Gastroenterology

## 2020-02-29 ENCOUNTER — Other Ambulatory Visit: Payer: Self-pay | Admitting: Gastroenterology

## 2020-04-11 ENCOUNTER — Other Ambulatory Visit: Payer: Self-pay | Admitting: Gastroenterology

## 2020-04-11 ENCOUNTER — Other Ambulatory Visit: Payer: Self-pay | Admitting: Family Medicine

## 2020-04-11 DIAGNOSIS — I10 Essential (primary) hypertension: Secondary | ICD-10-CM

## 2020-04-11 DIAGNOSIS — E785 Hyperlipidemia, unspecified: Secondary | ICD-10-CM

## 2020-04-18 ENCOUNTER — Other Ambulatory Visit: Payer: Self-pay

## 2020-04-19 ENCOUNTER — Ambulatory Visit (INDEPENDENT_AMBULATORY_CARE_PROVIDER_SITE_OTHER): Admitting: Family Medicine

## 2020-04-19 ENCOUNTER — Encounter: Payer: Self-pay | Admitting: Family Medicine

## 2020-04-19 VITALS — BP 140/70 | HR 92 | Temp 96.6°F | Ht 64.0 in | Wt 201.0 lb

## 2020-04-19 DIAGNOSIS — R1013 Epigastric pain: Secondary | ICD-10-CM

## 2020-04-19 DIAGNOSIS — K219 Gastro-esophageal reflux disease without esophagitis: Secondary | ICD-10-CM

## 2020-04-19 LAB — POCT URINALYSIS DIPSTICK
Bilirubin, UA: NEGATIVE
Blood, UA: NEGATIVE
Glucose, UA: POSITIVE — AB
Ketones, UA: NEGATIVE
Leukocytes, UA: NEGATIVE
Nitrite, UA: NEGATIVE
Protein, UA: NEGATIVE
Spec Grav, UA: 1.02 (ref 1.010–1.025)
Urobilinogen, UA: 0.2 E.U./dL
pH, UA: 6 (ref 5.0–8.0)

## 2020-04-19 MED ORDER — SUCRALFATE 1 GM/10ML PO SUSP: 1.0000 g | Freq: Three times a day (TID) | ORAL | 3 refills | Status: DC

## 2020-04-19 NOTE — Patient Instructions (Signed)
Cont with protonix 40mg  2x/day Restart sucralfate 1gm 3x/day with meals and at bedtime  If no improvement in 2-4 wks, please f/u with PCP or GI Dr. Silverio Decamp

## 2020-04-19 NOTE — Progress Notes (Signed)
Bailey [name] is a 64 y.o. female  Chief Complaint  Patient presents with  . Acute Visit    Pt c/o adb pain across the top of her stomach going towards her back, x 5 days .    HPI: Bailey [name] is a 64 y.o. female patient of Dr. Janett Billow Copland who complains of epigastric abdominal pain x 4-5 days. Pain is intermittent and she describes as "squeezing" pain. No nausea or vomiting. Bowels are at baseline. No fever, chills/rigors. No change in appetite but pt does think when she eats her stomach hurts more.  She has a h/o GERD and takes pantoprazole 68m BID. pepcid and sucralfate are listed on her med list but pt states she is not taking and they were prescribed for a short time. She had EGD in 04/2019 with Dr. LAntony MaduraDr. NSilverio Decamp She has cholecystectomy 20+ years ago. Pt would like urine checked to r/o UTI. No urinary symptoms.   Past Medical History:  Diagnosis Date  . Arthritis   . Cataract   . COPD [place] disease) ([facility]   . Diabetes mellitus without complication ([place]   . Fibrocystic breast disease July 2016  . GERD (gastroesophageal reflux disease)   . Hyperlipidemia   . Hypertension     Past Surgical History:  Procedure Laterality Date  . BREAST BIOPSY Right 04/19/2015   benign  . CATARACT EXTRACTION    . CHOLECYSTECTOMY    . COLONOSCOPY    . UTERINE FIBROID SURGERY      Social History   Socioeconomic History  . Marital status: Widowed    Spouse name: Not on file  . Number of children: 2  . Years of education: Not on file  . Highest education level: Not on file  Occupational History  . Occupation: HPresenter, broadcasting Tobacco Use  . Smoking status: Current Every Day Smoker    Packs/day: 1.00    Years: 20.00    Pack years: 20.00    Types: Cigarettes  . Smokeless tobacco: Never Used  . Tobacco comment: Tobacco info given 10/11/15  Vaping Use  . Vaping Use: Never used  Substance and Sexual Activity  . Alcohol use: Not on  file    Comment: rare use  . Drug use: No  . Sexual activity: Never    Birth control/protection: None  Other Topics Concern  . Not on file  Social History Narrative  . Not on file   Social Determinants of Health   Financial Resource Strain:   . Difficulty of Paying Living Expenses:   Food Insecurity:   . Worried About RCharity fundraiserin the Last Year:   . RArboriculturistin the Last Year:   Transportation Needs:   . LFilm/video editor(Medical):   .Marland KitchenLack of Transportation (Non-Medical):   Physical Activity:   . Days of Exercise per Week:   . Minutes of Exercise per Session:   Stress:   . Feeling of Stress :   Social Connections:   . Frequency of Communication with Friends and Family:   . Frequency of Social Gatherings with Friends and Family:   . Attends Religious Services:   . Active Member of Clubs or Organizations:   . Attends CArchivistMeetings:   .Marland KitchenMarital Status:   Intimate Partner Violence:   . Fear of Current or Ex-Partner:   . Emotionally Abused:   .Marland KitchenPhysically Abused:   . Sexually Abused:  Family History  Problem Relation Age of Onset  . Diabetes Mother   . Stroke Mother   . Dementia Mother   . Heart disease Father   . Diabetes Father   . Diabetes Brother   . Dementia Brother   . Renal Disease Brother   . Diabetes Sister   . Narcolepsy Sister   . Arthritis Sister   . Diabetes Sister   . Obesity Sister   . Diabetes Brother   . Heart disease Brother   . Cancer Brother        pancreatic  . Diabetes Brother   . Diabetes Brother   . Stomach cancer Sister   . Colon cancer Neg Hx   . Esophageal cancer Neg Hx   . Rectal cancer Neg Hx      Immunization History  Administered Date(s) Administered  . Influenza,inj,Quad PF,6+ Mos 09/07/2014, 07/26/2015, 08/06/2016, 09/06/2017, 08/03/2018, 07/24/2019  . PPD Test 01/15/2014, 09/26/2015  . Pneumococcal Polysaccharide-23 01/15/2014  . Tdap 01/15/2014  . Zoster 07/26/2015     Outpatient Encounter Medications as of 04/19/2020  Medication Sig  . acetaminophen (TYLENOL) 500 MG tablet Take 1,000 mg by mouth every 6 (six) hours as needed (pain). Reported on 04/08/2016  . atorvastatin (LIPITOR) 40 MG tablet Take 1 tablet by mouth once daily  . blood glucose meter kit and supplies KIT Dispense based on patient and insurance preference. Use up to four times daily as directed. (FOR ICD-9 250.00, 250.01).  . cholestyramine (QUESTRAN) 4 g packet DISSOLVE & TAKE 1 POWDER PACKET BY MOUTH TWICE DAILY  . cyclobenzaprine (FLEXERIL) 5 MG tablet TAKE ONE TABLET BY MOUTH AT BEDTIME AS NEEDED FOR NECK PAIN  . fluticasone (FLONASE) 50 MCG/ACT nasal spray Place 2 sprays into both nostrils daily.  Marland Kitchen glimepiride (AMARYL) 4 MG tablet TAKE 2 TABLETS BY MOUTH ONCE DAILY WITH BREAKFAST  . hydrochlorothiazide (MICROZIDE) 12.5 MG capsule Take 1 capsule by mouth once daily  . Lancets MISC Pt uses One Touch.  Test 1 or 2 times day  . lisinopril (ZESTRIL) 20 MG tablet Take 1 tablet by mouth once daily  . metFORMIN (GLUCOPHAGE) 1000 MG tablet Take 1 tablet (1,000 mg total) by mouth 2 (two) times daily with a meal.  . naproxen (NAPROSYN) 500 MG tablet TAKE 1 TABLET BY MOUTH TWICE DAILY WITH A MEAL.  Marland Kitchen ONETOUCH ULTRA test strip USE 1 STRIP TO CHECK GLUCOSE ONCE DAILY  . pantoprazole (PROTONIX) 40 MG tablet Take 1 tablet by mouth twice daily  . sucralfate (CARAFATE) 1 GM/10ML suspension Take 10 mLs (1 g total) by mouth 4 (four) times daily -  with meals and at bedtime.  . triamcinolone cream (KENALOG) 0.1 % Apply 1 application topically 2 (two) times daily. Use as needed on dry or scaly areas  . [DISCONTINUED] famotidine (PEPCID) 20 MG tablet Take 20 mg by mouth 2 (two) times daily.  . [DISCONTINUED] sucralfate (CARAFATE) 1 GM/10ML suspension Take 1 g by mouth 4 (four) times daily -  with meals and at bedtime.  . ciprofloxacin (CIPRO) 250 MG tablet Take 250 mg by mouth 2 (two) times daily. (Patient  not taking: Reported on 04/19/2020)  . meloxicam (MOBIC) 15 MG tablet TAKE ONE TABLET BY MOUTH DAILY.   USE  AS  NEEDED  FOR  KNEE  AND  BACK  PAIN (Patient not taking: Reported on 04/19/2020)  . metroNIDAZOLE (FLAGYL) 500 MG tablet Take 500 mg by mouth 3 (three) times daily. (Patient not taking: Reported on 04/19/2020)  No facility-administered encounter medications on file as of 04/19/2020.     ROS: Pertinent positives and negatives noted in HPI. Remainder of ROS non-contributory   No Known Allergies  BP (!) 140/70 (BP Location: Left Arm, Patient Position: Sitting, Cuff Size: Normal)   Pulse 92   Temp (!) 96.6 F (35.9 C) (Temporal)   Ht 5' 4" (1.626 m)   Wt 201 lb (91.2 kg)   SpO2 98%   BMI 34.50 kg/m   Physical Exam Abdominal:     General: Abdomen is flat. Bowel sounds are normal. There is no distension.     Palpations: Abdomen is soft.     Tenderness: There is abdominal tenderness ( + epigastric TTP w/o rebound or gaurding).  Neurological:     Mental Status: She is oriented to person, place, and time.  Psychiatric:        Mood and Affect: Mood normal.        Behavior: Behavior normal.    Component     Latest Ref Rng & Units 04/19/2020          Color, UA      yellow  Clarity, UA      clear  Glucose     Negative Positive (A)  Bilirubin, UA      negative  Specific Gravity, UA     1.010 - 1.025 1.020  RBC, UA      negative  pH, UA     5.0 - 8.0 6.0  Protein,UA     Negative Negative  Urobilinogen, UA     0.2 or 1.0 E.U./dL 0.2  Nitrite, UA      negative  Leukocytes,UA     Negative Negative  Ketones, UA      negative    A/P:  1. Epigastric abdominal pain 2. Gastroesophageal reflux disease, unspecified whether esophagitis present - cont with protonix 5m BID as she has been taking Refill: - sucralfate (CARAFATE) 1 GM/10ML suspension; Take 10 mLs (1 g total) by mouth 4 (four) times daily -  with meals and at bedtime.  Dispense: 420 mL; Refill: 3 -  POCT Urinalysis Dipstick - pt requested her urine be checked - if symptoms worsen or do not improve in 2-4 wks f/u with PCP or GI  Pt is overdue for A1C and has glucose in urine. She has appt with PCP next week!  This visit occurred during the SARS-CoV-2 public health emergency.  Safety protocols were in place, including screening questions prior to the visit, additional usage of staff PPE, and extensive cleaning of exam room while observing appropriate contact time as indicated for disinfecting solutions.

## 2020-04-27 NOTE — Progress Notes (Addendum)
[place] at [facility] [address] [place], [place] [address] [phone]  Date:  04/29/2020   Name:  Bailey [name]   DOB:  Sep 23, 1955   MRN:  [id number]  PCP:  Darreld Mclean, MD    Chief Complaint: Abdominal Pain (sunday, supper middle abdominal pain)   History of Present Illness:  Bailey [name] is a 64 y.o. very pleasant female patient who presents with the following:  Pt here today to discuss abd pain- history of diabetes for about 6 years, hyperlipidemia, high diabetic retinopathy Last seen by myself in February of this year to follow-up abd pain as well: Here today to follow-up abdominal pain, she was seen in outside clinic about 10 days ago for this issue.  She had a CT scan last week which was essentially normal.  She notes her pain is better, but not entirely resolved.  She feels tired and not quite herself.  We will check labs as above, continue to watch for labs from outside clinic which have apparently been faxed.  She also saw another Primary care doc, Cherene Julian about 10 days ago with c/o epigastric pain for 4-5 days 1. Epigastric abdominal pain 2. Gastroesophageal reflux disease, unspecified whether esophagitis present - cont with protonix '40mg'$  BID as she has been taking Refill: - sucralfate (CARAFATE) 1 GM/10ML suspension; Take 10 mLs (1 g total) by mouth 4 (four) times daily -  with meals and at bedtime.  Dispense: 420 mL; Refill: 3 - POCT Urinalysis Dipstick - pt requested her urine be checked - if symptoms worsen or do not improve in 2-4 wks f/u with PCP or GI  Foot exam- will do today covid series- done, she does not have the dates on her however A1c due Lab Results  Component Value Date   HGBA1C 10.7 (H) 07/24/2019   Pt notes that she had endoscopy per [facility] about one year ago She also had an abdominal CT earlier this year which was normal-done per [facility], I am not  able to review this result  carafate does seem to help She now continues to notice a burning type pain in her epigastric area. It is much better but not 100% well yet.   Eating popsicles does seem to help  She is taking iron once a day    Patient Active Problem List   Diagnosis Date Noted  . Delta beta thalassemia ([place]) 11/22/2019  . Diabetic retinopathy ([place]) 02/20/2017  . Fatty liver 11/24/2016  . GERD (gastroesophageal reflux disease) 07/26/2015  . Hyperlipidemia 07/26/2015  . Dyspepsia 07/26/2015  . Essential hypertension 07/26/2015  . Fibrocystic breast disease 03/22/2015  . Type 2 diabetes mellitus treated without insulin ([place]) 02/16/2013    Past Medical History:  Diagnosis Date  . Arthritis   . Cataract   . COPD (chronic obstructive pulmonary disease) ([place])   . Diabetes mellitus without complication ([place])   . Fibrocystic breast disease July 2016  . GERD (gastroesophageal reflux disease)   . Hyperlipidemia   . Hypertension     Past Surgical History:  Procedure Laterality Date  . BREAST BIOPSY Right 04/19/2015   benign  . CATARACT EXTRACTION    . CHOLECYSTECTOMY    . COLONOSCOPY    . UTERINE FIBROID SURGERY      Social History   Tobacco Use  . Smoking status: Current Every Day Smoker    Packs/day: 1.00    Years: 20.00  Pack years: 20.00    Types: Cigarettes  . Smokeless tobacco: Never Used  . Tobacco comment: Tobacco info given 10/11/15  Vaping Use  . Vaping Use: Never used  Substance Use Topics  . Alcohol use: Not on file    Comment: rare use  . Drug use: No    Family History  Problem Relation Age of Onset  . Diabetes Mother   . Stroke Mother   . Dementia Mother   . Heart disease Father   . Diabetes Father   . Diabetes Brother   . Dementia Brother   . Renal Disease Brother   . Diabetes Sister   . Narcolepsy Sister   . Arthritis Sister   . Diabetes Sister   . Obesity Sister   . Diabetes Brother   . Heart disease Brother   . Cancer  Brother        pancreatic  . Diabetes Brother   . Diabetes Brother   . Stomach cancer Sister   . Colon cancer Neg Hx   . Esophageal cancer Neg Hx   . Rectal cancer Neg Hx     No Known Allergies  Medication list has been reviewed and updated.  Current Outpatient Medications on File Prior to Visit  Medication Sig Dispense Refill  . acetaminophen (TYLENOL) 500 MG tablet Take 1,000 mg by mouth every 6 (six) hours as needed (pain). Reported on 04/08/2016    . atorvastatin (LIPITOR) 40 MG tablet Take 1 tablet by mouth once daily 90 tablet 1  . blood glucose meter kit and supplies KIT Dispense based on patient and insurance preference. Use up to four times daily as directed. (FOR ICD-9 250.00, 250.01). 1 each 0  . cholestyramine (QUESTRAN) 4 g packet DISSOLVE & TAKE 1 POWDER PACKET BY MOUTH TWICE DAILY 60 each 2  . ciprofloxacin (CIPRO) 250 MG tablet Take 250 mg by mouth 2 (two) times daily.     . cyclobenzaprine (FLEXERIL) 5 MG tablet TAKE ONE TABLET BY MOUTH AT BEDTIME AS NEEDED FOR NECK PAIN 30 tablet 0  . fluticasone (FLONASE) 50 MCG/ACT nasal spray Place 2 sprays into both nostrils daily. 16 g 0  . glimepiride (AMARYL) 4 MG tablet TAKE 2 TABLETS BY MOUTH ONCE DAILY WITH BREAKFAST 180 tablet 1  . hydrochlorothiazide (MICROZIDE) 12.5 MG capsule Take 1 capsule by mouth once daily 90 capsule 1  . Lancets MISC Pt uses One Touch.  Test 1 or 2 times day 100 each 1  . lisinopril (ZESTRIL) 20 MG tablet Take 1 tablet by mouth once daily 90 tablet 1  . meloxicam (MOBIC) 15 MG tablet TAKE ONE TABLET BY MOUTH DAILY.   USE  AS  NEEDED  FOR  KNEE  AND  BACK  PAIN 30 tablet 2  . metFORMIN (GLUCOPHAGE) 1000 MG tablet Take 1 tablet (1,000 mg total) by mouth 2 (two) times daily with a meal. 180 tablet 3  . metroNIDAZOLE (FLAGYL) 500 MG tablet Take 500 mg by mouth 3 (three) times daily.     . naproxen (NAPROSYN) 500 MG tablet TAKE 1 TABLET BY MOUTH TWICE DAILY WITH A MEAL. 60 tablet 2  . ONETOUCH ULTRA test  strip USE 1 STRIP TO CHECK GLUCOSE ONCE DAILY 75 each 3  . pantoprazole (PROTONIX) 40 MG tablet Take 1 tablet by mouth twice daily 60 tablet 0  . sucralfate (CARAFATE) 1 GM/10ML suspension Take 10 mLs (1 g total) by mouth 4 (four) times daily -  with meals and at bedtime.  420 mL 3  . triamcinolone cream (KENALOG) 0.1 % Apply 1 application topically 2 (two) times daily. Use as needed on dry or scaly areas 60 g 1   No current facility-administered medications on file prior to visit.    Review of Systems:  As per HPI- otherwise negative.   Physical Examination: Vitals:   04/29/20 1435  BP: 124/78  Pulse: (!) 101  Resp: 18  Temp: 98.1 F (36.7 C)  SpO2: 98%   Vitals:   04/29/20 1435  Weight: 202 lb (91.6 kg)  Height: 5' 5.5" (1.664 m)   Body mass index is 33.1 kg/m. Ideal Body Weight: Weight in (lb) to have BMI = 25: 152.2  GEN: no acute distress. Obese, looks well and her normal self  HEENT: Atraumatic, Normocephalic.   Bilateral TM wnl, oropharynx normal.  PEERL,EOMI.   Ears and Nose: No external deformity. CV: RRR, No M/G/R. No JVD. No thrill. No extra heart sounds. PULM: CTA B, no wheezes, crackles, rhonchi. No retractions. No resp. distress. No accessory muscle use. ABD: S, NT, ND, +BS. No rebound. No HSM. Abdominal exam is benign today EXTR: No c/c/e PSYCH: Normally interactive. Conversant.  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 She has had epigastric pain in the past, underwent endoscopy about 1 year ago She has been taking a PPI regularly. She was seen by another provider about 10 days ago and added Carafate. Right now her symptoms are better, but not completely gone I would like to rule out pancreatitis and hepatitis, will obtain lab work as above Assuming these are normal we will have her return to GI if symptoms return Check an A1c today History of iron deficiency anemia as well as thalassemia. Check CBC and ferritin Blood pressure well controlled This visit occurred during the SARS-CoV-2 public health emergency.  Safety protocols were in place, including screening questions prior to the visit, additional usage of staff PPE, and extensive cleaning of exam room while observing appropriate contact time as indicated for disinfecting solutions.   Signed Lamar Blinks, MD  8/10- received her labs, message to pt   Pancreatic enzymes and liver enzymes are normal- good news Iron level is normal Minimal anemia on CBC is likely due to your thalassemia.  Cholesterol is overall favorable- continue lipitor  I'm afraid your A1c is still much too high- we need to change your diabetes regimen.  I would like to add an injectable medication from the GLP-1 agonist class to help bring your blood sugars down if ok with you?    Please let me know   I hope that your stomach is continuing to feel better!  Results for orders placed or performed in visit on 04/29/20  Hemoglobin A1c  Result Value Ref Range   Hgb A1c MFr Bld 10.5 Repeated and verified X2. (H) 4.6 - 6.5 %  Lipid panel  Result Value Ref Range   Cholesterol 159 0 - 200 mg/dL   Triglycerides 116.0 0 -  149 mg/dL   HDL 43.90 >39.00 mg/dL   VLDL 23.2 0.0 - 40.0 mg/dL   LDL Cholesterol 92 0 - 99 mg/dL   Total CHOL/HDL Ratio 4    NonHDL 115.01   CBC  Result Value Ref Range   WBC 10.5 4.0 - 10.5 K/uL   RBC 5.11 3.87 - 5.11 Mil/uL    Platelets 300.0 150 - 400 K/uL   Hemoglobin 11.8 (L) 12.0 - 15.0 g/dL   HCT 37.2 36 - 46 %   MCV 72.7 (L) 78.0 - 100.0 fl   MCHC 31.6 30.0 - 36.0 g/dL   RDW 17.8 (H) 11.5 - 15.5 %  Comprehensive metabolic panel  Result Value Ref Range   Sodium 138 135 - 145 mEq/L   Potassium 3.7 3.5 - 5.1 mEq/L   Chloride 102 96 - 112 mEq/L   CO2 25 19 - 32 mEq/L   Glucose, Bld 101 (H) 70 - 99 mg/dL   BUN 13 6 - 23 mg/dL   Creatinine, Ser 0.64 0.40 - 1.20 mg/dL   Total Bilirubin 0.3 0.2 - 1.2 mg/dL   Alkaline Phosphatase 100 39 - 117 U/L   AST 18 0 - 37 U/L   ALT 26 0 - 35 U/L   Total Protein 7.4 6.0 - 8.3 g/dL   Albumin 4.1 3.5 - 5.2 g/dL   GFR 112.73 >60.00 mL/min   Calcium 9.8 8.4 - 10.5 mg/dL  Amylase  Result Value Ref Range   Amylase 32 27 - 131 U/L  Lipase  Result Value Ref Range   Lipase 43.0 11 - 59 U/L  Ferritin  Result Value Ref Range   Ferritin 36.7 10.0 - 291.0 ng/mL

## 2020-04-29 ENCOUNTER — Other Ambulatory Visit: Payer: Self-pay

## 2020-04-29 ENCOUNTER — Ambulatory Visit (INDEPENDENT_AMBULATORY_CARE_PROVIDER_SITE_OTHER): Admitting: Family Medicine

## 2020-04-29 ENCOUNTER — Encounter: Payer: Self-pay | Admitting: Family Medicine

## 2020-04-29 VITALS — BP 124/78 | HR 101 | Temp 98.1°F | Resp 18 | Ht 65.5 in | Wt 202.0 lb

## 2020-04-29 DIAGNOSIS — D562 Delta-beta thalassemia: Secondary | ICD-10-CM

## 2020-04-29 DIAGNOSIS — I10 Essential (primary) hypertension: Secondary | ICD-10-CM | POA: Diagnosis not present

## 2020-04-29 DIAGNOSIS — E785 Hyperlipidemia, unspecified: Secondary | ICD-10-CM | POA: Diagnosis not present

## 2020-04-29 DIAGNOSIS — E119 Type 2 diabetes mellitus without complications: Secondary | ICD-10-CM | POA: Diagnosis not present

## 2020-04-29 DIAGNOSIS — E611 Iron deficiency: Secondary | ICD-10-CM | POA: Diagnosis not present

## 2020-04-29 DIAGNOSIS — R1013 Epigastric pain: Secondary | ICD-10-CM

## 2020-04-29 NOTE — Patient Instructions (Signed)
Great to see you again today! I will be in touch with your labs asap Let me know if anything is changing or getting worse in the meantime

## 2020-04-30 ENCOUNTER — Other Ambulatory Visit: Payer: Self-pay | Admitting: Family Medicine

## 2020-04-30 LAB — CBC
HCT: 37.2 % (ref 36.0–46.0)
Hemoglobin: 11.8 g/dL — ABNORMAL LOW (ref 12.0–15.0)
MCHC: 31.6 g/dL (ref 30.0–36.0)
MCV: 72.7 fl — ABNORMAL LOW (ref 78.0–100.0)
Platelets: 300 10*3/uL (ref 150.0–400.0)
RBC: 5.11 Mil/uL (ref 3.87–5.11)
RDW: 17.8 % — ABNORMAL HIGH (ref 11.5–15.5)
WBC: 10.5 10*3/uL (ref 4.0–10.5)

## 2020-04-30 LAB — COMPREHENSIVE METABOLIC PANEL
ALT: 26 U/L (ref 0–35)
AST: 18 U/L (ref 0–37)
Albumin: 4.1 g/dL (ref 3.5–5.2)
Alkaline Phosphatase: 100 U/L (ref 39–117)
BUN: 13 mg/dL (ref 6–23)
CO2: 25 mEq/L (ref 19–32)
Calcium: 9.8 mg/dL (ref 8.4–10.5)
Chloride: 102 mEq/L (ref 96–112)
Creatinine, Ser: 0.64 mg/dL (ref 0.40–1.20)
GFR: 112.73 mL/min (ref >60.00)
Glucose, Bld: 101 mg/dL — ABNORMAL HIGH (ref 70–99)
Potassium: 3.7 mEq/L (ref 3.5–5.1)
Sodium: 138 mEq/L (ref 135–145)
Total Bilirubin: 0.3 mg/dL (ref 0.2–1.2)
Total Protein: 7.4 g/dL (ref 6.0–8.3)

## 2020-04-30 LAB — LIPID PANEL
Cholesterol: 159 mg/dL (ref 0–200)
HDL: 43.9 mg/dL (ref >39.00)
LDL Cholesterol: 92 mg/dL (ref 0–99)
NonHDL: 115.01
Total CHOL/HDL Ratio: 4
Triglycerides: 116 mg/dL (ref 0.0–149.0)
VLDL: 23.2 mg/dL (ref 0.0–40.0)

## 2020-04-30 LAB — HEMOGLOBIN A1C: Hgb A1c MFr Bld: 10.5 % — ABNORMAL HIGH (ref 4.6–6.5)

## 2020-04-30 LAB — FERRITIN: Ferritin: 36.7 ng/mL (ref 10.0–291.0)

## 2020-04-30 LAB — LIPASE: Lipase: 43 U/L (ref 11.0–59.0)

## 2020-04-30 LAB — AMYLASE: Amylase: 32 U/L (ref 27–131)

## 2020-05-01 NOTE — Progress Notes (Signed)
Please advise on start of newer diabetic medication.

## 2020-05-21 ENCOUNTER — Other Ambulatory Visit: Payer: Self-pay | Admitting: Gastroenterology

## 2020-06-17 ENCOUNTER — Other Ambulatory Visit: Payer: Self-pay | Admitting: Family Medicine

## 2020-06-17 DIAGNOSIS — I10 Essential (primary) hypertension: Secondary | ICD-10-CM

## 2020-09-05 ENCOUNTER — Other Ambulatory Visit: Payer: Self-pay | Admitting: Family Medicine

## 2020-09-05 ENCOUNTER — Other Ambulatory Visit: Payer: Self-pay | Admitting: Gastroenterology

## 2020-09-05 DIAGNOSIS — E785 Hyperlipidemia, unspecified: Secondary | ICD-10-CM

## 2020-09-05 DIAGNOSIS — E119 Type 2 diabetes mellitus without complications: Secondary | ICD-10-CM

## 2020-09-07 NOTE — Progress Notes (Addendum)
[place] at [facility] [address] [place], [place] [address] [phone]  Date:  09/12/2020   Name:  Bailey [name]   DOB:  1956/08/06   MRN:  [id number]  PCP:  Darreld Mclean, MD    Chief Complaint: No chief complaint on file.   History of Present Illness:  Bailey [name] is a 64 y.o. very pleasant female patient who presents with the following:  Here today for a medication check Last seen by myself in August of this year-  history of diabetes for about 6 years, hyperlipidemia, high diabetic retinopathy, thalassemia  At her last labs A1c was significantly elevated as below.  I contacted patient about adding a medication but she did not respond- pt does not recall getting this message but she does use her mychart account Her younger sister- who has some significant health issues - is staying with her right now.  This is a significant stressor  covid vaccine- done, including booster  Flu vaccine- give today dexa scan- will order for her to do with her mammo in January  Eye exam- this is due, she is aware  prevnar 84-  Give today  Labs August 2021  Lipitor Glimepiride 8 mg daily Metformin HCTZ 12.5 Lisinopril 20  She has lost about 10 lbs intentionally; we hope this will translate to improvement of her A1c  Wt Readings from Last 3 Encounters:  09/12/20 191 lb (86.6 kg)  04/29/20 202 lb (91.6 kg)  04/19/20 201 lb (91.2 kg)     Lab Results  Component Value Date   HGBA1C 10.5 Repeated and verified X2. (H) 04/29/2020     Patient Active Problem List   Diagnosis Date Noted  . Delta beta thalassemia ([place]) 11/22/2019  . Diabetic retinopathy ([place]) 02/20/2017  . Fatty liver 11/24/2016  . GERD (gastroesophageal reflux disease) 07/26/2015  . Hyperlipidemia 07/26/2015  . Dyspepsia 07/26/2015  . Essential hypertension 07/26/2015  . Fibrocystic breast disease 03/22/2015  . Type 2 diabetes mellitus  treated without insulin ([place]) 02/16/2013    Past Medical History:  Diagnosis Date  . Arthritis   . Cataract   . COPD (chronic obstructive pulmonary disease) ([place])   . Diabetes mellitus without complication ([place])   . Fibrocystic breast disease July 2016  . GERD (gastroesophageal reflux disease)   . Hyperlipidemia   . Hypertension     Past Surgical History:  Procedure Laterality Date  . BREAST BIOPSY Right 04/19/2015   benign  . CATARACT EXTRACTION    . CHOLECYSTECTOMY    . COLONOSCOPY    . UTERINE FIBROID SURGERY      Social History   Tobacco Use  . Smoking status: Current Every Day Smoker    Packs/day: 1.00    Years: 20.00    Pack years: 20.00    Types: Cigarettes  . Smokeless tobacco: Never Used  . Tobacco comment: Tobacco info given 10/11/15  Vaping Use  . Vaping Use: Never used  Substance Use Topics  . Drug use: No    Family History  Problem Relation Age of Onset  . Diabetes Mother   . Stroke Mother   . Dementia Mother   . Heart disease Father   . Diabetes Father   . Diabetes Brother   . Dementia Brother   . Renal Disease Brother   . Diabetes Sister   . Narcolepsy Sister   . Arthritis Sister   . Diabetes Sister   .  Obesity Sister   . Diabetes Brother   . Heart disease Brother   . Cancer Brother        pancreatic  . Diabetes Brother   . Diabetes Brother   . Stomach cancer Sister   . Colon cancer Neg Hx   . Esophageal cancer Neg Hx   . Rectal cancer Neg Hx     No Known Allergies  Medication list has been reviewed and updated.  Current Outpatient Medications on File Prior to Visit  Medication Sig Dispense Refill  . acetaminophen (TYLENOL) 500 MG tablet Take 1,000 mg by mouth every 6 (six) hours as needed (pain). Reported on 04/08/2016    . atorvastatin (LIPITOR) 40 MG tablet Take 1 tablet (40 mg total) by mouth daily. 90 tablet 0  . blood glucose meter kit and supplies KIT Dispense based on patient and insurance preference. Use up to four  times daily as directed. (FOR ICD-9 250.00, 250.01). 1 each 0  . cyclobenzaprine (FLEXERIL) 5 MG tablet TAKE ONE TABLET BY MOUTH AT BEDTIME AS NEEDED FOR NECK PAIN 30 tablet 0  . fluticasone (FLONASE) 50 MCG/ACT nasal spray Place 2 sprays into both nostrils daily. 16 g 0  . glimepiride (AMARYL) 4 MG tablet Take 2 tablets (8 mg total) by mouth daily with breakfast. 180 tablet 0  . hydrochlorothiazide (MICROZIDE) 12.5 MG capsule Take 1 capsule (12.5 mg total) by mouth daily. 90 capsule 1  . Lancets MISC Pt uses One Touch.  Test 1 or 2 times day 100 each 1  . meloxicam (MOBIC) 15 MG tablet TAKE ONE TABLET BY MOUTH DAILY.   USE  AS  NEEDED  FOR  KNEE  AND  BACK  PAIN 30 tablet 2  . metFORMIN (GLUCOPHAGE) 1000 MG tablet Take 1 tablet (1,000 mg total) by mouth 2 (two) times daily with a meal. 180 tablet 0  . naproxen (NAPROSYN) 500 MG tablet TAKE 1 TABLET BY MOUTH TWICE DAILY WITH A MEAL. 60 tablet 2  . ONETOUCH ULTRA test strip USE 1 STRIP TO CHECK GLUCOSE ONCE DAILY 75 each 3  . pantoprazole (PROTONIX) 40 MG tablet Take 1 tablet by mouth twice daily 60 tablet 0  . triamcinolone cream (KENALOG) 0.1 % Apply 1 application topically 2 (two) times daily. Use as needed on dry or scaly areas 60 g 1   No current facility-administered medications on file prior to visit.    Review of Systems:  As per HPI- otherwise negative.   Physical Examination: Vitals:   09/12/20 0931  BP: (!) 158/82  Pulse: 90  Resp: 16  SpO2: 99%   Vitals:   09/12/20 0931  Weight: 191 lb (86.6 kg)  Height: 5' 5.5" (1.664 m)   Body mass index is 31.3 kg/m. Ideal Body Weight: Weight in (lb) to have BMI = 25: 152.2  GEN: no acute distress.  Obese, looks well  HEENT: Atraumatic, Normocephalic.  Ears and Nose: No external deformity. CV: RRR, No M/G/R. No JVD. No thrill. No extra heart sounds. PULM: CTA B, no wheezes, crackles, rhonchi. No retractions. No resp. distress. No accessory muscle use. ABD: S, NT, ND, +BS. No  rebound. No HSM. EXTR: No c/c/e PSYCH: Normally interactive. Conversant.    Assessment and Plan: Controlled type 2 diabetes mellitus without complication, without long-term current use of insulin ([place]) - Plan: Hemoglobin A1c, CANCELED: Basic metabolic panel  Essential hypertension - Plan: lisinopril (ZESTRIL) 20 MG tablet, Basic metabolic panel  Epigastric abdominal pain - Plan: sucralfate (CARAFATE)  1 GM/10ML suspension  Gastroesophageal reflux disease, unspecified whether esophagitis present - Plan: sucralfate (CARAFATE) 1 GM/10ML suspension  Dyspepsia - Plan: cholestyramine (QUESTRAN) 4 g packet  Estrogen deficiency - Plan: DG Bone Density  Weight loss - Plan: TSH  Immunization due - Plan: Pneumococcal conjugate vaccine 13-valent IM  Needs flu shot - Plan: Flu Vaccine QUAD 6+ mos PF IM (Fluarix Quad PF)   Patient is here today for a follow-up visit Update flu and Pneumovax today Ordered bone density scan to be done when she has her mammogram in January  She uses cholestyramine about once a day for chronic GERD, also uses Carafate as needed.  Refilled both of these today Her blood pressure is up, but she has been out of her lisinopril.  Refill this and she plans to restart Her last A1c was elevated, we did not connect about medication change.  However, since that time she has lost about 10 pounds, we hope this will show improvement Encourage her to continue working weight loss, healthy diet and exercise Will plan further follow- up pending labs.  This visit occurred during the SARS-CoV-2 public health emergency.  Safety protocols were in place, including screening questions prior to the visit, additional usage of staff PPE, and extensive cleaning of exam room while observing appropriate contact time as indicated for disinfecting solutions.     Signed Lamar Blinks, MD  Received her labs as below, message to pt  Results for orders placed or performed in visit on  09/12/20  Hemoglobin A1c  Result Value Ref Range   Hgb A1c MFr Bld 10.6 (H) 4.6 - 6.5 %  TSH  Result Value Ref Range   TSH 1.10 0.35 - 4.50 uIU/mL  Basic metabolic panel  Result Value Ref Range   Sodium 138 135 - 145 mEq/L   Potassium 4.2 3.5 - 5.1 mEq/L   Chloride 104 96 - 112 mEq/L   CO2 26 19 - 32 mEq/L   Glucose, Bld 141 (H) 70 - 99 mg/dL   BUN 11 6 - 23 mg/dL   Creatinine, Ser 0.58 0.40 - 1.20 mg/dL   GFR 95.08 >60.00 mL/min   Calcium 9.5 8.4 - 10.5 mg/dL

## 2020-09-12 ENCOUNTER — Other Ambulatory Visit: Payer: Self-pay

## 2020-09-12 ENCOUNTER — Ambulatory Visit (INDEPENDENT_AMBULATORY_CARE_PROVIDER_SITE_OTHER): Payer: Medicare HMO | Admitting: Family Medicine

## 2020-09-12 ENCOUNTER — Other Ambulatory Visit: Payer: Self-pay | Admitting: Family Medicine

## 2020-09-12 ENCOUNTER — Encounter: Payer: Self-pay | Admitting: Family Medicine

## 2020-09-12 VITALS — BP 158/82 | HR 90 | Resp 16 | Ht 65.5 in | Wt 191.0 lb

## 2020-09-12 DIAGNOSIS — I10 Essential (primary) hypertension: Secondary | ICD-10-CM

## 2020-09-12 DIAGNOSIS — J449 Chronic obstructive pulmonary disease, unspecified: Secondary | ICD-10-CM | POA: Diagnosis not present

## 2020-09-12 DIAGNOSIS — R1013 Epigastric pain: Secondary | ICD-10-CM

## 2020-09-12 DIAGNOSIS — K219 Gastro-esophageal reflux disease without esophagitis: Secondary | ICD-10-CM | POA: Diagnosis not present

## 2020-09-12 DIAGNOSIS — Z23 Encounter for immunization: Secondary | ICD-10-CM | POA: Diagnosis not present

## 2020-09-12 DIAGNOSIS — E2839 Other primary ovarian failure: Secondary | ICD-10-CM

## 2020-09-12 DIAGNOSIS — R634 Abnormal weight loss: Secondary | ICD-10-CM

## 2020-09-12 DIAGNOSIS — E119 Type 2 diabetes mellitus without complications: Secondary | ICD-10-CM | POA: Diagnosis not present

## 2020-09-12 DIAGNOSIS — Z1231 Encounter for screening mammogram for malignant neoplasm of breast: Secondary | ICD-10-CM

## 2020-09-12 LAB — BASIC METABOLIC PANEL
BUN: 11 mg/dL (ref 6–23)
CO2: 26 mEq/L (ref 19–32)
Calcium: 9.5 mg/dL (ref 8.4–10.5)
Chloride: 104 mEq/L (ref 96–112)
Creatinine, Ser: 0.58 mg/dL (ref 0.40–1.20)
GFR: 95.08 mL/min (ref >60.00)
Glucose, Bld: 141 mg/dL — ABNORMAL HIGH (ref 70–99)
Potassium: 4.2 mEq/L (ref 3.5–5.1)
Sodium: 138 mEq/L (ref 135–145)

## 2020-09-12 LAB — TSH: TSH: 1.1 u[IU]/mL (ref 0.35–4.50)

## 2020-09-12 LAB — HEMOGLOBIN A1C: Hgb A1c MFr Bld: 10.6 % — ABNORMAL HIGH (ref 4.6–6.5)

## 2020-09-12 MED ORDER — CHOLESTYRAMINE 4 G PO PACK: PACK | ORAL | 3 refills | Status: DC

## 2020-09-12 MED ORDER — LISINOPRIL 20 MG PO TABS: 20.0000 mg | ORAL_TABLET | Freq: Every day | ORAL | 3 refills | Status: DC

## 2020-09-12 MED ORDER — SUCRALFATE 1 GM/10ML PO SUSP: 1.0000 g | Freq: Three times a day (TID) | ORAL | 3 refills | Status: DC

## 2020-09-12 NOTE — Patient Instructions (Addendum)
It was good to see you again today I will be in touch with your labs- we will see how your A1c looks and check on your thyroid level I ordered a bone density scan for you, they should be able to do this with your mammogram  BP is high- please start back on your lisinopril asap to help bring this down  Assuming all is well please see me in 4-6 months

## 2020-09-13 ENCOUNTER — Encounter: Payer: Self-pay | Admitting: Family Medicine

## 2020-09-16 MED ORDER — CANAGLIFLOZIN 100 MG PO TABS: 100.0000 mg | ORAL_TABLET | Freq: Every day | ORAL | 3 refills | Status: DC

## 2020-09-17 ENCOUNTER — Encounter: Payer: Self-pay | Admitting: Family Medicine

## 2020-09-17 ENCOUNTER — Telehealth: Payer: Self-pay | Admitting: Family Medicine

## 2020-09-17 DIAGNOSIS — N644 Mastodynia: Secondary | ICD-10-CM

## 2020-09-17 NOTE — Telephone Encounter (Signed)
Patient states she is having tenderness in her right breast. (close to the armpit) Patient tries to schedule mammogram sooner than April. She will need a bilateral diagnostic mammogram order to be sent [facility])

## 2020-09-26 DIAGNOSIS — H43813 Vitreous degeneration, bilateral: Secondary | ICD-10-CM | POA: Diagnosis not present

## 2020-09-26 DIAGNOSIS — E113393 Type 2 diabetes mellitus with moderate nonproliferative diabetic retinopathy without macular edema, bilateral: Secondary | ICD-10-CM | POA: Diagnosis not present

## 2020-09-26 DIAGNOSIS — H3582 Retinal ischemia: Secondary | ICD-10-CM | POA: Diagnosis not present

## 2020-09-26 DIAGNOSIS — H35373 Puckering of macula, bilateral: Secondary | ICD-10-CM | POA: Diagnosis not present

## 2020-10-09 ENCOUNTER — Other Ambulatory Visit: Payer: Self-pay | Admitting: Gastroenterology

## 2020-10-10 DIAGNOSIS — Z1152 Encounter for screening for COVID-19: Secondary | ICD-10-CM | POA: Diagnosis not present

## 2020-10-24 ENCOUNTER — Other Ambulatory Visit: Payer: Self-pay | Admitting: *Deleted

## 2020-10-24 DIAGNOSIS — E119 Type 2 diabetes mellitus without complications: Secondary | ICD-10-CM

## 2020-10-24 DIAGNOSIS — E785 Hyperlipidemia, unspecified: Secondary | ICD-10-CM

## 2020-10-24 MED ORDER — ATORVASTATIN CALCIUM 40 MG PO TABS: 40.0000 mg | ORAL_TABLET | Freq: Every day | ORAL | 1 refills | Status: DC

## 2020-10-24 MED ORDER — PANTOPRAZOLE SODIUM 40 MG PO TBEC: 40.0000 mg | DELAYED_RELEASE_TABLET | Freq: Two times a day (BID) | ORAL | 1 refills | Status: DC

## 2020-10-24 MED ORDER — ACCU-CHEK GUIDE ME W/DEVICE KIT: PACK | 0 refills | Status: DC

## 2020-10-24 MED ORDER — GLIMEPIRIDE 4 MG PO TABS: 8.0000 mg | ORAL_TABLET | Freq: Every day | ORAL | 1 refills | Status: DC

## 2020-10-24 MED ORDER — ACCU-CHEK SOFTCLIX LANCETS MISC: 1 refills | Status: DC

## 2020-10-24 MED ORDER — ACCU-CHEK GUIDE VI STRP: ORAL_STRIP | 1 refills | Status: DC

## 2020-10-29 ENCOUNTER — Other Ambulatory Visit: Payer: Self-pay

## 2020-10-29 ENCOUNTER — Other Ambulatory Visit: Payer: Self-pay | Admitting: Family Medicine

## 2020-10-29 ENCOUNTER — Ambulatory Visit
Admission: RE | Admit: 2020-10-29 | Discharge: 2020-10-29 | Disposition: A | Discharge status: Home / Self Care | Payer: Medicare HMO | Source: Ambulatory Visit | Attending: Family Medicine | Admitting: Family Medicine

## 2020-10-29 ENCOUNTER — Ambulatory Visit
Admission: RE | Admit: 2020-10-29 | Discharge: 2020-10-29 | Disposition: A | Discharge status: Home / Self Care | Source: Ambulatory Visit | Attending: Family Medicine | Admitting: Family Medicine

## 2020-10-29 DIAGNOSIS — N6021 Fibroadenosis of right breast: Secondary | ICD-10-CM | POA: Diagnosis not present

## 2020-10-29 DIAGNOSIS — N644 Mastodynia: Secondary | ICD-10-CM

## 2020-10-29 DIAGNOSIS — R928 Other abnormal and inconclusive findings on diagnostic imaging of breast: Secondary | ICD-10-CM | POA: Diagnosis not present

## 2020-11-01 ENCOUNTER — Ambulatory Visit
Admission: RE | Admit: 2020-11-01 | Discharge: 2020-11-01 | Disposition: A | Discharge status: Home / Self Care | Source: Ambulatory Visit | Attending: Family Medicine | Admitting: Family Medicine

## 2020-11-01 ENCOUNTER — Other Ambulatory Visit: Payer: Self-pay

## 2020-11-01 DIAGNOSIS — N6311 Unspecified lump in the right breast, upper outer quadrant: Secondary | ICD-10-CM | POA: Diagnosis not present

## 2020-11-01 DIAGNOSIS — N644 Mastodynia: Secondary | ICD-10-CM

## 2020-11-01 DIAGNOSIS — D241 Benign neoplasm of right breast: Secondary | ICD-10-CM | POA: Diagnosis not present

## 2020-11-01 HISTORY — PX: BREAST BIOPSY: SHX20

## 2020-11-12 ENCOUNTER — Other Ambulatory Visit

## 2020-12-20 ENCOUNTER — Other Ambulatory Visit: Payer: Self-pay | Admitting: Family Medicine

## 2020-12-24 ENCOUNTER — Other Ambulatory Visit: Payer: Medicare HMO

## 2020-12-24 ENCOUNTER — Ambulatory Visit: Payer: Medicare HMO

## 2020-12-25 ENCOUNTER — Ambulatory Visit: Payer: Medicare HMO

## 2020-12-26 ENCOUNTER — Ambulatory Visit: Payer: Medicare HMO

## 2020-12-26 ENCOUNTER — Other Ambulatory Visit: Payer: Medicare HMO

## 2021-02-02 ENCOUNTER — Other Ambulatory Visit: Payer: Self-pay | Admitting: Family Medicine

## 2021-02-06 ENCOUNTER — Telehealth: Payer: Self-pay

## 2021-02-06 MED ORDER — METFORMIN HCL 1000 MG PO TABS: 1000.0000 mg | ORAL_TABLET | Freq: Two times a day (BID) | ORAL | 0 refills | Status: DC

## 2021-02-06 NOTE — Telephone Encounter (Signed)
Refilled for 30 day supply until patient appointment.

## 2021-02-06 NOTE — Telephone Encounter (Signed)
Pt past due for apt, was scheduled for 02/19/2021 needs a refill on metformin she's completely out. Please advise. -JMA

## 2021-02-06 NOTE — Addendum Note (Signed)
Addended by: Wynonia Musty A on: 02/06/2021 10:02 AM   Modules accepted: Orders

## 2021-02-13 NOTE — Patient Instructions (Addendum)
It was good to see you again today, I will be in touch with your labs as soon as possible Assuming your blood sugars are still high we will start you on Trulicty pen injections Continue amaryl- when we start on trulicity we can decrease your metformin to once a day and this will hopefully help with your GI symptoms   I will place a referral for you to see a new GYN provider in [place]  I refilled your cholestyramine as well

## 2021-02-13 NOTE — Progress Notes (Addendum)
[place] [facility] [address] [place], [place] [address] [phone]  Date:  02/19/2021   Name:  Bailey [name]   DOB:  22-Oct-1955   MRN:  [id number]  PCP:  Darreld Mclean, MD    Chief Complaint: Abdominal Issue (Bowel issues, using bathroom often, hx of cholecystectomy)   History of Present Illness:  Bailey [name] is a 65 y.o. very pleasant female patient who presents with the following:  Here today for a medication follow-up visit-however, upon arrival today she notes concern of GI upset and diarrhea.  The patient arrived late and we were not able to start her visit until almost 20 minutes past appointment time Last seen by myself in December History of diabetes, hyperlipidemia, diabetic retinopathy  Bone density scan scheduled for this September Eye exam- pt states she is seeing her retinal specialist regularly  COVID-19 booster/fourth dose is complete Can update Pap smear- she would like to see GYN and est with a new provider  Lab Results  Component Value Date   HGBA1C 7.8 (H) 02/19/2021  At her last visit in December, her A1c was found to be elevated and we added Invokana to her regimen However she stopped taking it about a month ago as it seemed to "make me quiver"  She ran out of her Lucrezia Starch and notes that she has been using kaopectate more recently to control her chronic diarrhea She does have a gastroenterologist but has not seen them recently-she would be interested in a follow-up visit to discuss her chronic symptoms  She is taking amaryl still She is also taking metformin 1000 twice daily but we suspect this may be contributing to her GI symptoms, would like to reduce this dose if we can  No personal or family history of thyroid cancer or MEN syndrome, no history of pancreatitis -she is willing to try an injectable GLP-1 if A1c is still elevated  She does not generally check her blood  sugars  Patient Active Problem List   Diagnosis Date Noted  . Delta beta thalassemia ([place]) 11/22/2019  . Diabetic retinopathy ([place]) 02/20/2017  . Fatty liver 11/24/2016  . GERD (gastroesophageal reflux disease) 07/26/2015  . Hyperlipidemia 07/26/2015  . Dyspepsia 07/26/2015  . Essential hypertension 07/26/2015  . Fibrocystic breast disease 03/22/2015  . Type 2 diabetes mellitus treated without insulin ([place]) 02/16/2013    Past Medical History:  Diagnosis Date  . Arthritis   . Cataract   . COPD (chronic obstructive pulmonary disease) ([place])   . Diabetes mellitus without complication ([place])   . Fibrocystic breast disease July 2016  . GERD (gastroesophageal reflux disease)   . Hyperlipidemia   . Hypertension     Past Surgical History:  Procedure Laterality Date  . BREAST BIOPSY Right 04/19/2015   benign  . CATARACT EXTRACTION    . CHOLECYSTECTOMY    . COLONOSCOPY    . UTERINE FIBROID SURGERY      Social History   Tobacco Use  . Smoking status: Current Every Day Smoker    Packs/day: 1.00    Years: 20.00    Pack years: 20.00    Types: Cigarettes  . Smokeless tobacco: Never Used  . Tobacco comment: Tobacco info given 10/11/15  Vaping Use  . Vaping Use: Never used  Substance Use Topics  . Drug use: No    Family History  Problem Relation Age of Onset  . Diabetes Mother   .  Stroke Mother   . Dementia Mother   . Heart disease Father   . Diabetes Father   . Diabetes Brother   . Dementia Brother   . Renal Disease Brother   . Diabetes Sister   . Narcolepsy Sister   . Arthritis Sister   . Diabetes Sister   . Obesity Sister   . Diabetes Brother   . Heart disease Brother   . Cancer Brother        pancreatic  . Diabetes Brother   . Diabetes Brother   . Stomach cancer Sister   . Colon cancer Neg Hx   . Esophageal cancer Neg Hx   . Rectal cancer Neg Hx     No Known Allergies  Medication list has been reviewed and updated.  Current Outpatient Medications on  File Prior to Visit  Medication Sig Dispense Refill  . Accu-Chek Softclix Lancets lancets Use to check blood sugar once a day. Dx code: E11.9 100 each 1  . acetaminophen (TYLENOL) 500 MG tablet Take 1,000 mg by mouth every 6 (six) hours as needed (pain). Reported on 04/08/2016    . atorvastatin (LIPITOR) 40 MG tablet Take 1 tablet (40 mg total) by mouth daily. 90 tablet 1  . blood glucose meter kit and supplies KIT Dispense based on patient and insurance preference. Use up to four times daily as directed. (FOR ICD-9 250.00, 250.01). 1 each 0  . Blood Glucose Monitoring Suppl (ACCU-CHEK GUIDE ME) w/Device KIT Use to check blood sugar once a day. Dx code: E11.9 1 kit 0  . canagliflozin (INVOKANA) 100 MG TABS tablet Take 1 tablet (100 mg total) by mouth daily before breakfast. 30 tablet 3  . cyclobenzaprine (FLEXERIL) 5 MG tablet TAKE ONE TABLET BY MOUTH AT BEDTIME AS NEEDED FOR NECK PAIN 30 tablet 0  . fluticasone (FLONASE) 50 MCG/ACT nasal spray Place 2 sprays into both nostrils daily. 16 g 0  . glimepiride (AMARYL) 4 MG tablet Take 2 tablets (8 mg total) by mouth daily with breakfast. 180 tablet 1  . glucose blood (ACCU-CHEK GUIDE) test strip Use to check blood sugar once a day. Dx code: E11.9 100 each 1  . hydrochlorothiazide (MICROZIDE) 12.5 MG capsule Take 1 capsule (12.5 mg total) by mouth daily. 90 capsule 1  . lisinopril (ZESTRIL) 20 MG tablet Take 1 tablet (20 mg total) by mouth daily. 90 tablet 3  . meloxicam (MOBIC) 15 MG tablet TAKE ONE TABLET BY MOUTH DAILY.   USE  AS  NEEDED  FOR  KNEE  AND  BACK  PAIN 30 tablet 2  . metFORMIN (GLUCOPHAGE) 1000 MG tablet Take 1 tablet (1,000 mg total) by mouth 2 (two) times daily with a meal. 60 tablet 0  . naproxen (NAPROSYN) 500 MG tablet TAKE 1 TABLET BY MOUTH TWICE DAILY WITH A MEAL. 60 tablet 2  . pantoprazole (PROTONIX) 40 MG tablet Take 1 tablet (40 mg total) by mouth 2 (two) times daily. 180 tablet 1  . sucralfate (CARAFATE) 1 GM/10ML suspension  Take 10 mLs (1 g total) by mouth 4 (four) times daily -  with meals and at bedtime. 420 mL 3  . triamcinolone cream (KENALOG) 0.1 % Apply 1 application topically 2 (two) times daily. Use as needed on dry or scaly areas 60 g 1   No current facility-administered medications on file prior to visit.    Review of Systems:  As per HPI- otherwise negative.   Physical Examination: Vitals:   02/19/21 0935  BP: 128/82  Pulse: 100  Resp: 17  Temp: (!) 97.3 F (36.3 C)  SpO2: 99%   Vitals:   02/19/21 0935  Weight: 195 lb (88.5 kg)  Height: 5' 5.5" (1.664 m)   Body mass index is 31.96 kg/m. Ideal Body Weight: Weight in (lb) to have BMI = 25: 152.2  GEN: no acute distress.  Obese, otherwise looks well HEENT: Atraumatic, Normocephalic.  Ears and Nose: No external deformity. CV: RRR, No M/G/R. No JVD. No thrill. No extra heart sounds. PULM: CTA B, no wheezes, crackles, rhonchi. No retractions. No resp. distress. No accessory muscle use. ABD: S, NT, ND, +BS. No rebound. No HSM.  Belly is benign EXTR: No c/c/e PSYCH: Normally interactive. Conversant.    Assessment and Plan: Essential hypertension - Plan: Comprehensive metabolic panel, CANCELED: Basic metabolic panel  Controlled type 2 diabetes mellitus without complication, without long-term current use of insulin ([place]) - Plan: Hemoglobin A1c, Comprehensive metabolic panel  Dyspepsia - Plan: cholestyramine (QUESTRAN) 4 g packet  Gastroesophageal reflux disease, unspecified whether esophagitis present  Well woman exam - Plan: Ambulatory referral to Obstetrics / Gynecology, CANCELED: Ambulatory referral to Obstetrics / Gynecology  Patient seen today with a few concerns. Blood pressure is okay on current regimen Check A1c today, we expect this will be elevated as she is no longer taking Invokana which I added last year.  Most likely will start on a GLP-1 agonist, we did Trulicity teaching today Refilled her cholestyramine which had  run out Contacted her GI provider and asked her to reach out to patient and schedule follow-up Referral to a new gynecologist in [place] Will plan further follow- up pending labs.  This visit occurred during the SARS-CoV-2 public health emergency.  Safety protocols were in place, including screening questions prior to the visit, additional usage of staff PPE, and extensive cleaning of exam room while observing appropriate contact time as indicated for disinfecting solutions.    Signed Lamar Blinks, MD   Received her labs as below, message to patient Also will offer lung cancer screening CT Alkaline phosphatase is again elevated.  I will see if an ANA can be added onto her labs Right upper quadrant ultrasound from 2018 showed fatty liver Await A1c  Received her A1c 6/3- called pt to discuss For the time being she would rather continue metformin BID instead of adding trulicity Encouraged her to keep working on diet and exercise, and recheck in 3 months   Results for orders placed or performed in visit on 02/19/21  Comprehensive metabolic panel  Result Value Ref Range   Sodium 138 135 - 145 mEq/L   Potassium 4.0 3.5 - 5.1 mEq/L   Chloride 105 96 - 112 mEq/L   CO2 24 19 - 32 mEq/L   Glucose, Bld 153 (H) 70 - 99 mg/dL   BUN 9 6 - 23 mg/dL   Creatinine, Ser 0.61 0.40 - 1.20 mg/dL   Total Bilirubin 0.4 0.2 - 1.2 mg/dL   Alkaline Phosphatase 140 (H) 39 - 117 U/L   AST 14 0 - 37 U/L   ALT 25 0 - 35 U/L   Total Protein 6.3 6.0 - 8.3 g/dL   Albumin 3.8 3.5 - 5.2 g/dL   GFR 93.65 >60.00 mL/min   Calcium 9.7 8.4 - 10.5 mg/dL   Lab Results  Component Value Date   HGBA1C 7.8 (H) 02/19/2021   6/5- received her antimitochondrial DNA ab- negative With clinical scenario of mildly elevated alk phos, elevated GGT,  normal bile ducts on ultrasound (2018) and negative AMA, plan is for observation.  We will communicate this plan to patient

## 2021-02-19 ENCOUNTER — Encounter: Payer: Self-pay | Admitting: Family Medicine

## 2021-02-19 ENCOUNTER — Other Ambulatory Visit: Payer: Medicare HMO

## 2021-02-19 ENCOUNTER — Ambulatory Visit: Payer: Medicare HMO | Attending: Internal Medicine

## 2021-02-19 ENCOUNTER — Ambulatory Visit (INDEPENDENT_AMBULATORY_CARE_PROVIDER_SITE_OTHER): Payer: Medicare HMO | Admitting: Family Medicine

## 2021-02-19 ENCOUNTER — Other Ambulatory Visit: Payer: Self-pay

## 2021-02-19 VITALS — BP 128/82 | HR 100 | Temp 97.3°F | Resp 17 | Ht 65.5 in | Wt 195.0 lb

## 2021-02-19 DIAGNOSIS — Z23 Encounter for immunization: Secondary | ICD-10-CM

## 2021-02-19 DIAGNOSIS — E119 Type 2 diabetes mellitus without complications: Secondary | ICD-10-CM | POA: Diagnosis not present

## 2021-02-19 DIAGNOSIS — R1013 Epigastric pain: Secondary | ICD-10-CM

## 2021-02-19 DIAGNOSIS — I10 Essential (primary) hypertension: Secondary | ICD-10-CM | POA: Diagnosis not present

## 2021-02-19 DIAGNOSIS — K219 Gastro-esophageal reflux disease without esophagitis: Secondary | ICD-10-CM

## 2021-02-19 DIAGNOSIS — Z122 Encounter for screening for malignant neoplasm of respiratory organs: Secondary | ICD-10-CM

## 2021-02-19 DIAGNOSIS — Z01419 Encounter for gynecological examination (general) (routine) without abnormal findings: Secondary | ICD-10-CM

## 2021-02-19 LAB — COMPREHENSIVE METABOLIC PANEL
ALT: 25 U/L (ref 0–35)
AST: 14 U/L (ref 0–37)
Albumin: 3.8 g/dL (ref 3.5–5.2)
Alkaline Phosphatase: 140 U/L — ABNORMAL HIGH (ref 39–117)
BUN: 9 mg/dL (ref 6–23)
CO2: 24 mEq/L (ref 19–32)
Calcium: 9.7 mg/dL (ref 8.4–10.5)
Chloride: 105 mEq/L (ref 96–112)
Creatinine, Ser: 0.61 mg/dL (ref 0.40–1.20)
GFR: 93.65 mL/min (ref >60.00)
Glucose, Bld: 153 mg/dL — ABNORMAL HIGH (ref 70–99)
Potassium: 4 mEq/L (ref 3.5–5.1)
Sodium: 138 mEq/L (ref 135–145)
Total Bilirubin: 0.4 mg/dL (ref 0.2–1.2)
Total Protein: 6.3 g/dL (ref 6.0–8.3)

## 2021-02-19 MED ORDER — CHOLESTYRAMINE 4 G PO PACK: PACK | ORAL | 3 refills | Status: DC

## 2021-02-19 NOTE — Progress Notes (Signed)
   Covid-19 Vaccination Clinic  Name:  Bailey [name]    MRN: [id number] DOB: 09/11/1956  02/19/2021  Bailey [name] was observed post Covid-19 immunization for 15 minutes without incident. She was provided with Vaccine Information Sheet and instruction to access the V-Safe system.   Bailey [name] was instructed to call 911 with any severe reactions post vaccine: Marland Kitchen Difficulty breathing  . Swelling of face and throat  . A fast heartbeat  . A bad rash all over body  . Dizziness and weakness   Immunizations Administered    Name Date Dose VIS Date Route   PFIZER Comrnaty(Gray TOP) Covid-19 Vaccine 02/19/2021 10:40 AM 0.3 mL 08/29/2020 Intramuscular   Manufacturer: [employer]   Lot: [id number]   NDC: [phone]

## 2021-02-20 ENCOUNTER — Other Ambulatory Visit: Payer: Medicare HMO

## 2021-02-20 ENCOUNTER — Encounter: Payer: Self-pay | Admitting: Family Medicine

## 2021-02-20 ENCOUNTER — Other Ambulatory Visit: Payer: Self-pay | Admitting: *Deleted

## 2021-02-20 DIAGNOSIS — R748 Abnormal levels of other serum enzymes: Secondary | ICD-10-CM

## 2021-02-20 LAB — HEMOGLOBIN A1C
Hgb A1c MFr Bld: 7.8 % of total Hgb — ABNORMAL HIGH (ref <5.7)
Mean Plasma Glucose: 177 mg/dL
eAG (mmol/L): 9.8 mmol/L

## 2021-02-21 LAB — MITOCHONDRIAL ANTIBODIES: Mitochondrial M2 Ab, IgG: <=20 U

## 2021-02-21 IMAGING — MG DIGITAL DIAGNOSTIC BILAT W/ TOMO W/ CAD
7 series · 8 of 19 positions shown · non-contrast
Comparison: Previous exam(s).

CLINICAL DATA: Patient reports diffuse pain in the UPPER-OUTER
QUADRANT of the RIGHT breast since [REDACTED]. She also has RIGHT
neck and RIGHT shoulder pain.

EXAM:
DIGITAL DIAGNOSTIC BILATERAL MAMMOGRAM WITH TOMOSYNTHESIS AND CAD;
ULTRASOUND RIGHT BREAST LIMITED
TECHNIQUE: Bilateral digital diagnostic mammography and breast tomosynthesis
was performed. The images were evaluated with computer-aided
detection.; Targeted ultrasound examination of the right breast was
performed.

[L CC synth-2D]
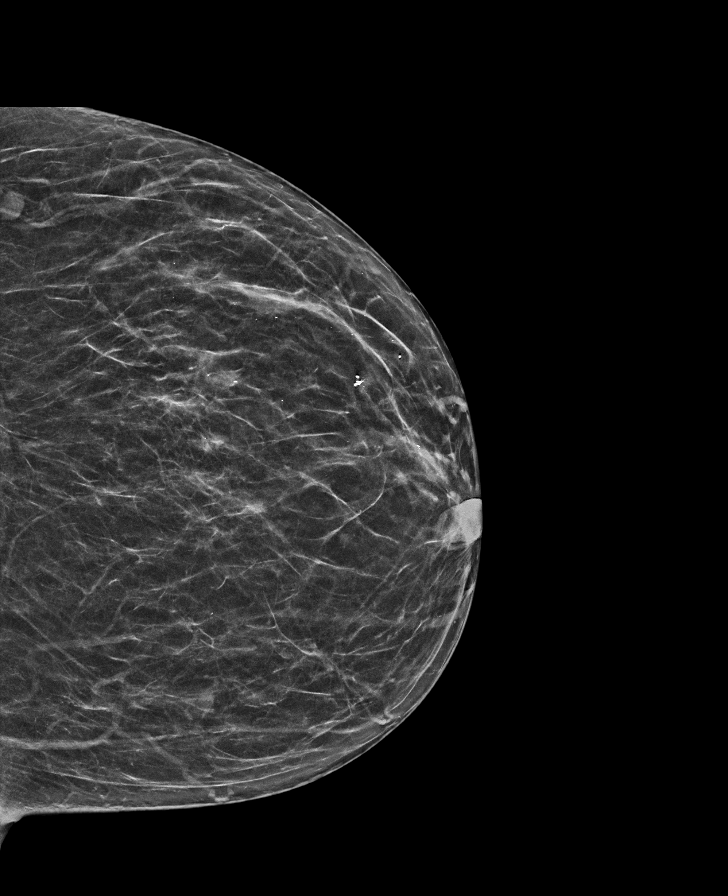

[L MLO synth-2D]
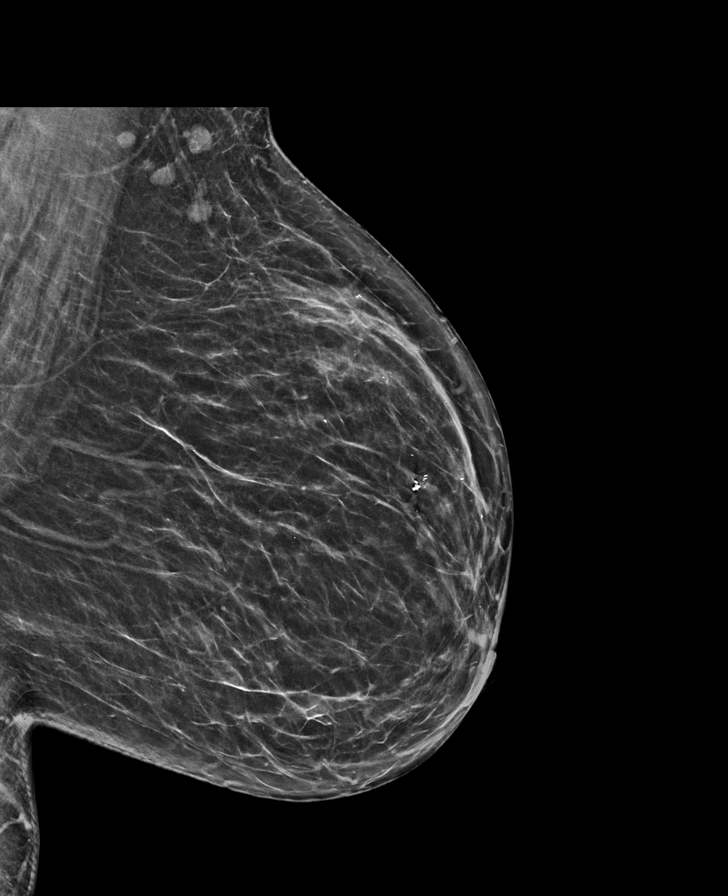

[R CC synth-2D]
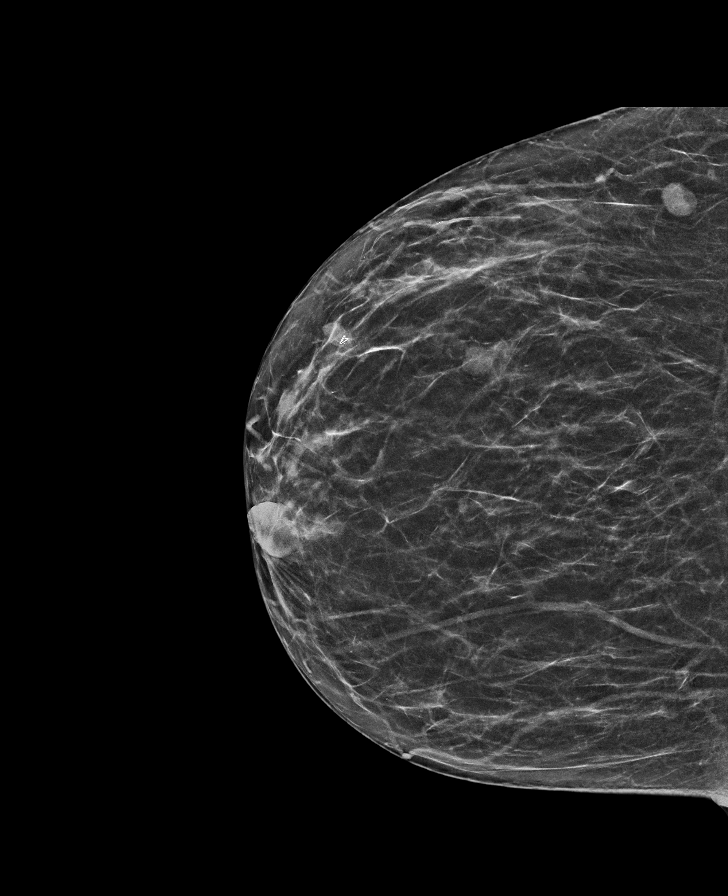

[R MLO synth-2D]
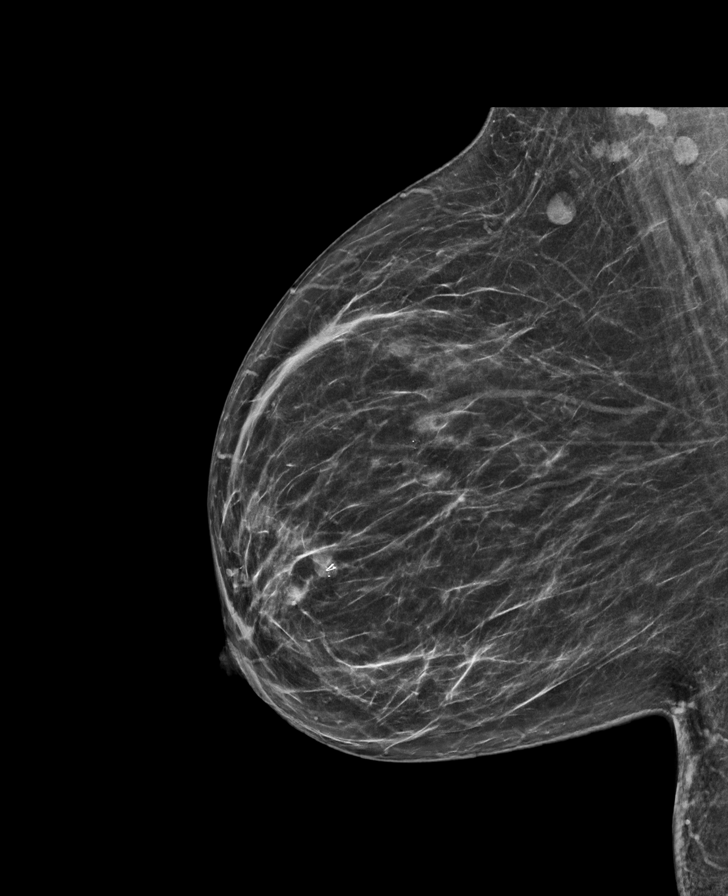

[R CC tomo · 2 of 57 frames shown]
[frame 19/57]
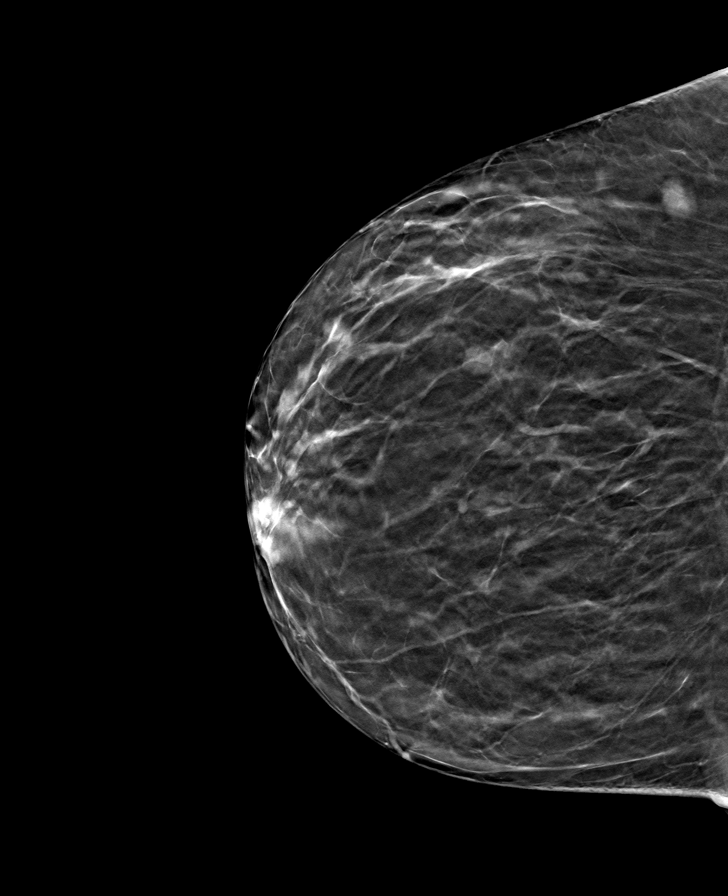
[frame 29/57]
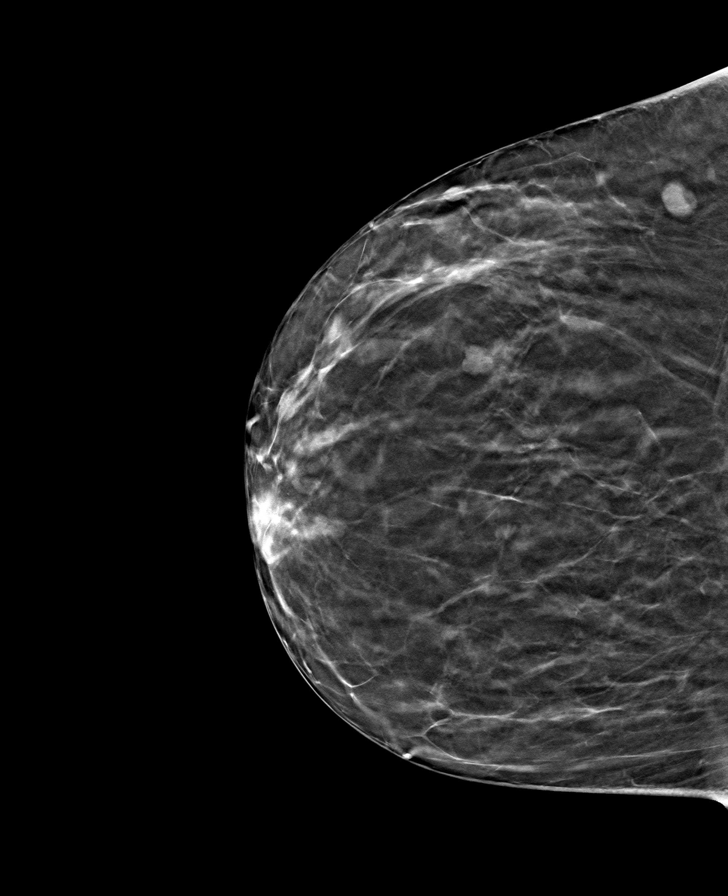

[R MLO tomo · tomo slice 34/67.0]
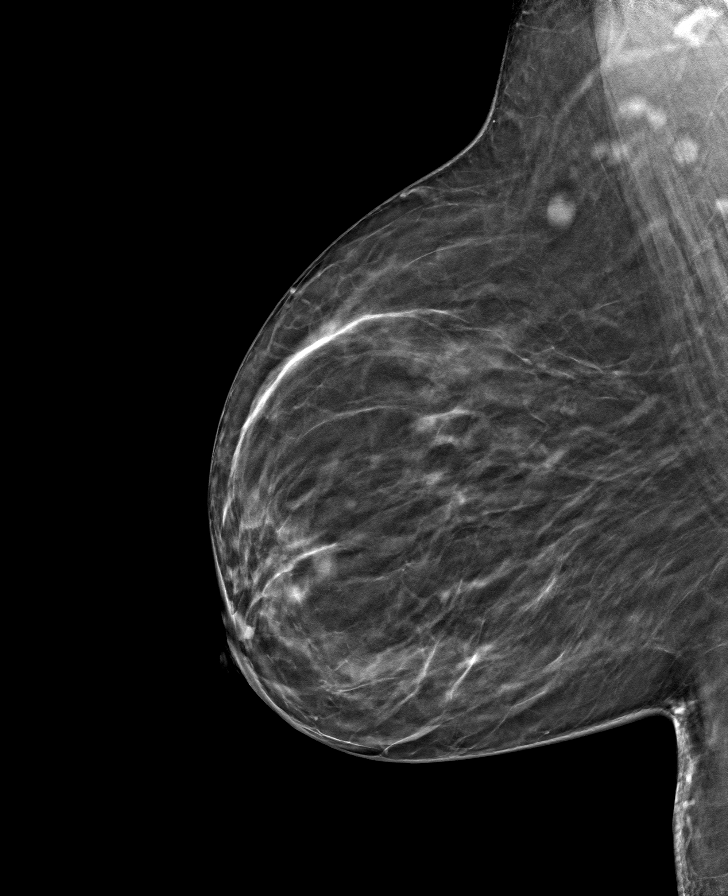

[L MLO tomo · tomo slice 33/65.0]
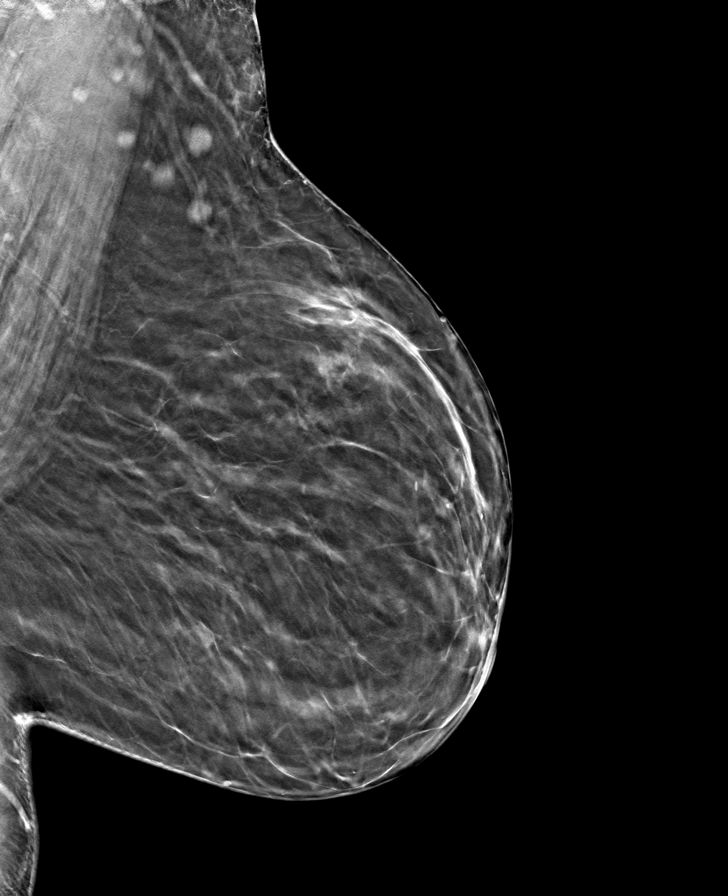

[8 of 19 positions shown; findings below may reference images not displayed]

ACR Breast Density Category b: There are scattered areas of
fibroglandular density.
FINDINGS: A circumscribed oval mass contains a heart shaped clip and
represents the biopsy-proven fibroadenoma in the UPPER-OUTER
QUADRANT. Circumscribed oval superficial mass in the UPPER-OUTER
QUADRANT appears stable. Within the middle depth of the UPPER INNER
QUADRANT there is a 0.9 centimeter oval mass further evaluated with
ultrasound. LEFT breast is negative.

On physical exam, I palpate no abnormality in the UPPER-OUTER
QUADRANT of the RIGHT breast.

Targeted ultrasound is performed, showing an oval hypoechoic mass in
the 9:30 o'clock location of the RIGHT breast 3 centimeters from the
nipple containing tissue marker clip and representing the previously
biopsied fibroadenoma.

In the 10 o'clock location 4 centimeters from the nipple, an oval
hypoechoic parallel circumscribed mass is 1.0 x 0.3 x
centimeters. There is no associated internal blood flow. A BB is
placed over this abnormality, and follow-up spot tangential view
confirms that the sonographic and mammographic findings are the
same.

Evaluation of the RIGHT axilla shows mildly prominent lymph nodes
with long-term stability.
IMPRESSION: 1. Mass in the 10 o'clock location of the RIGHT breast 4 centimeters
from the nipple warrants tissue diagnosis.
2. Stable appearance of biopsy proven fibroadenoma in the 9:30
o'clock location of the RIGHT breast.
3. No mammographic or sonographic abnormality corresponding to the
area pain in the UPPER-OUTER QUADRANT of the RIGHT breast.

RECOMMENDATION:
Recommend ultrasound-guided core biopsy of mass in the 10 o'clock
location of the RIGHT breast.

I have discussed the findings and recommendations with the patient.
If applicable, a reminder letter will be sent to the patient
regarding the next appointment.

BI-RADS CATEGORY  4: Suspicious.

## 2021-02-23 ENCOUNTER — Encounter: Payer: Self-pay | Admitting: Family Medicine

## 2021-02-23 DIAGNOSIS — R748 Abnormal levels of other serum enzymes: Secondary | ICD-10-CM

## 2021-02-24 ENCOUNTER — Other Ambulatory Visit (HOSPITAL_BASED_OUTPATIENT_CLINIC_OR_DEPARTMENT_OTHER): Payer: Self-pay

## 2021-02-24 MED ORDER — PFIZER-BIONT COVID-19 VAC-TRIS 30 MCG/0.3ML IM SUSP
INTRAMUSCULAR | 0 refills | Status: DC
  Filled 2021-02-24: qty 0.3, 1d supply, fill #0

## 2021-02-25 ENCOUNTER — Other Ambulatory Visit (HOSPITAL_BASED_OUTPATIENT_CLINIC_OR_DEPARTMENT_OTHER): Payer: Self-pay

## 2021-02-26 ENCOUNTER — Telehealth: Payer: Self-pay

## 2021-02-26 NOTE — Telephone Encounter (Signed)
-----   Message from Mauri Pole, MD sent at 02/20/2021  3:18 PM EDT ----- Thank you for letting me know, we will bring her in for an appointment soon.  Beth, can you please schedule next available appt with me or APP  Thanks VN ----- Message ----- From: Darreld Mclean, MD Sent: 02/19/2021  12:36 PM EDT To: Mauri Pole, MD  Hi Kavitha-I saw Elliana today for routine follow-up, she complained of some worsening of her chronic diarrhea.  She would like to follow-up with you.  I wondered if you would be so kind as to ask your staff to reach out to her and schedule an appointment  Thank you so much! [place]

## 2021-02-26 NOTE — Telephone Encounter (Signed)
Called the patient to schedule her an appointment. No answer. Left her a message to call back and ask for me.

## 2021-03-07 NOTE — Telephone Encounter (Signed)
Spoke with Pt. Patient already has Follow Up visit with Ellouise Newer, PA on July 12,2022 at [facility]. Patient verbalized understanding of reason for visit and date and time. Nothing further needed at this time.

## 2021-03-10 ENCOUNTER — Other Ambulatory Visit: Payer: Self-pay

## 2021-03-10 ENCOUNTER — Ambulatory Visit (HOSPITAL_BASED_OUTPATIENT_CLINIC_OR_DEPARTMENT_OTHER)
Admission: RE | Admit: 2021-03-10 | Discharge: 2021-03-10 | Disposition: A | Discharge status: Home / Self Care | Payer: Medicare HMO | Source: Ambulatory Visit | Attending: Family Medicine | Admitting: Family Medicine

## 2021-03-10 DIAGNOSIS — R748 Abnormal levels of other serum enzymes: Secondary | ICD-10-CM

## 2021-03-11 ENCOUNTER — Encounter: Payer: Self-pay | Admitting: Family Medicine

## 2021-03-14 ENCOUNTER — Other Ambulatory Visit: Payer: Self-pay | Admitting: Family Medicine

## 2021-03-27 ENCOUNTER — Other Ambulatory Visit: Payer: Self-pay | Admitting: Family Medicine

## 2021-03-27 DIAGNOSIS — E785 Hyperlipidemia, unspecified: Secondary | ICD-10-CM

## 2021-03-27 DIAGNOSIS — H35373 Puckering of macula, bilateral: Secondary | ICD-10-CM | POA: Diagnosis not present

## 2021-03-27 DIAGNOSIS — H3582 Retinal ischemia: Secondary | ICD-10-CM | POA: Diagnosis not present

## 2021-03-27 DIAGNOSIS — E113312 Type 2 diabetes mellitus with moderate nonproliferative diabetic retinopathy with macular edema, left eye: Secondary | ICD-10-CM | POA: Diagnosis not present

## 2021-03-27 DIAGNOSIS — E119 Type 2 diabetes mellitus without complications: Secondary | ICD-10-CM

## 2021-03-27 DIAGNOSIS — E113391 Type 2 diabetes mellitus with moderate nonproliferative diabetic retinopathy without macular edema, right eye: Secondary | ICD-10-CM | POA: Diagnosis not present

## 2021-03-28 ENCOUNTER — Ambulatory Visit
Admission: RE | Admit: 2021-03-28 | Discharge: 2021-03-28 | Disposition: A | Discharge status: Home / Self Care | Payer: Medicare HMO | Source: Ambulatory Visit | Attending: Family Medicine | Admitting: Family Medicine

## 2021-03-28 DIAGNOSIS — F17210 Nicotine dependence, cigarettes, uncomplicated: Secondary | ICD-10-CM | POA: Diagnosis not present

## 2021-03-28 DIAGNOSIS — Z122 Encounter for screening for malignant neoplasm of respiratory organs: Secondary | ICD-10-CM

## 2021-03-31 ENCOUNTER — Encounter: Payer: Self-pay | Admitting: Family Medicine

## 2021-04-01 ENCOUNTER — Encounter: Payer: Self-pay | Admitting: Physician Assistant

## 2021-04-01 ENCOUNTER — Ambulatory Visit (INDEPENDENT_AMBULATORY_CARE_PROVIDER_SITE_OTHER): Payer: Medicare HMO | Admitting: Physician Assistant

## 2021-04-01 VITALS — BP 130/70 | HR 88 | Ht 65.0 in | Wt 192.0 lb

## 2021-04-01 DIAGNOSIS — K9089 Other intestinal malabsorption: Secondary | ICD-10-CM

## 2021-04-01 DIAGNOSIS — R1031 Right lower quadrant pain: Secondary | ICD-10-CM | POA: Diagnosis not present

## 2021-04-01 DIAGNOSIS — R1032 Left lower quadrant pain: Secondary | ICD-10-CM

## 2021-04-01 MED ORDER — DICYCLOMINE HCL 20 MG PO TABS: 20.0000 mg | ORAL_TABLET | Freq: Four times a day (QID) | ORAL | 5 refills | Status: DC

## 2021-04-01 NOTE — Progress Notes (Signed)
Chief Complaint: Chronic diarrhea  HPI:    Bailey [name] is a 65 year old African-American female with past medical history of COPD and others listed below, known to Dr. Silverio Decamp, who presents clinic today for diarrhea.    11/07/2015 colonoscopy with colonic diverticulosis, random colon biopsies were negative for microscopic colitis.  Repeat recommended in 10 years.    05/09/2019 patient seen in clinic by Dr. Silverio Decamp for reflux.  Noted that her sister had stomach cancer in her 15s and her brother was diagnosed with pancreatic cancer.  At that time her diarrhea was better controlled with cholestyramine.  Was recommended she have an EGD.    05/11/2019 EGD with nodular mucosa in the gastric antrum.  Pathology showed ulcer.    02/19/2021 CMP with glucose elevated at 153 and alk phos minimally elevated at 140 (this has fluctuated in the past as well).    Today, the patient presents to clinic and explains that she ran out of her Cholestyramine like a year ago, but has exchanged this for Kaopectate and tells me that they work about the same explaining that she has a whole "routine" in the morning that keeps her in her house for at least 3 to 4 hours.  Tells me that she will take her medicines and then the minute she puts a spoonful of food in her mouth she feels the urge to have to run to the bathroom with lower abdominal cramping and she will go back and forth having a somewhat formed stool for at least 3 hours or so with lower abdominal cramping.  Once this is over then she can typically leave the house and the rest of the day is not as bad, but the pain is her big complaint today rated as a 6/10 when it occurs and typically across her lower abdomen.    Denies fever, chills or blood in her stool.  Past Medical History:  Diagnosis Date   Arthritis    Cataract    COPD (chronic obstructive pulmonary disease) ([place])    Diabetes mellitus without complication ([place])    Fibrocystic breast disease July 2016    GERD (gastroesophageal reflux disease)    Hyperlipidemia    Hypertension     Past Surgical History:  Procedure Laterality Date   BREAST BIOPSY Right 04/19/2015   benign   CATARACT EXTRACTION     CHOLECYSTECTOMY     COLONOSCOPY     UTERINE FIBROID SURGERY      Current Outpatient Medications  Medication Sig Dispense Refill   Accu-Chek Softclix Lancets lancets Use to check blood sugar once a day. Dx code: E11.9 100 each 1   acetaminophen (TYLENOL) 500 MG tablet Take 1,000 mg by mouth every 6 (six) hours as needed (pain). Reported on 04/08/2016     atorvastatin (LIPITOR) 40 MG tablet TAKE 1 TABLET EVERY DAY 90 tablet 1   blood glucose meter kit and supplies KIT Dispense based on patient and insurance preference. Use up to four times daily as directed. (FOR ICD-9 250.00, 250.01). 1 each 0   Blood Glucose Monitoring Suppl (ACCU-CHEK GUIDE ME) w/Device KIT Use to check blood sugar once a day. Dx code: E11.9 1 kit 0   canagliflozin (INVOKANA) 100 MG TABS tablet Take 1 tablet (100 mg total) by mouth daily before breakfast. 30 tablet 3   cholestyramine (QUESTRAN) 4 g packet DISSOLVE & TAKE 1 POWDER PACKET BY MOUTH TWICE DAILY 180 packet 3   COVID-19 mRNA Vac-TriS, Pfizer, (PFIZER-BIONT COVID-19 VAC-TRIS) SUSP injection  Inject into the muscle. 0.3 mL 0   cyclobenzaprine (FLEXERIL) 5 MG tablet TAKE ONE TABLET BY MOUTH AT BEDTIME AS NEEDED FOR NECK PAIN 30 tablet 0   fluticasone (FLONASE) 50 MCG/ACT nasal spray Place 2 sprays into both nostrils daily. 16 g 0   glimepiride (AMARYL) 4 MG tablet TAKE 2 TABLETS EVERY DAY WITH BREAKFAST 180 tablet 1   glucose blood (ACCU-CHEK GUIDE) test strip USE ONE TIME DAILY TO TEST BLOOD SUGAR 100 strip 1   hydrochlorothiazide (MICROZIDE) 12.5 MG capsule Take 1 capsule (12.5 mg total) by mouth daily. 90 capsule 1   lisinopril (ZESTRIL) 20 MG tablet Take 1 tablet (20 mg total) by mouth daily. 90 tablet 3   meloxicam (MOBIC) 15 MG tablet TAKE ONE TABLET BY MOUTH  DAILY.   USE  AS  NEEDED  FOR  KNEE  AND  BACK  PAIN 30 tablet 2   metFORMIN (GLUCOPHAGE) 1000 MG tablet TAKE 1 TABLET BY MOUTH TWICE DAILY WITH A MEAL . APPOINTMENT REQUIRED FOR FUTURE REFILLS 60 tablet 0   naproxen (NAPROSYN) 500 MG tablet TAKE 1 TABLET BY MOUTH TWICE DAILY WITH A MEAL. 60 tablet 2   pantoprazole (PROTONIX) 40 MG tablet TAKE 1 TABLET TWICE DAILY 180 tablet 1   sucralfate (CARAFATE) 1 GM/10ML suspension Take 10 mLs (1 g total) by mouth 4 (four) times daily -  with meals and at bedtime. 420 mL 3   triamcinolone cream (KENALOG) 0.1 % Apply 1 application topically 2 (two) times daily. Use as needed on dry or scaly areas 60 g 1   No current facility-administered medications for this visit.    Allergies as of 04/01/2021   (No Known Allergies)    Family History  Problem Relation Age of Onset   Diabetes Mother    Stroke Mother    Dementia Mother    Heart disease Father    Diabetes Father    Diabetes Brother    Dementia Brother    Renal Disease Brother    Diabetes Sister    Narcolepsy Sister    Arthritis Sister    Diabetes Sister    Obesity Sister    Diabetes Brother    Heart disease Brother    Cancer Brother        pancreatic   Diabetes Brother    Diabetes Brother    Stomach cancer Sister    Colon cancer Neg Hx    Esophageal cancer Neg Hx    Rectal cancer Neg Hx     Social History   Socioeconomic History   Marital status: Widowed    Spouse name: Not on file   Number of children: 2   Years of education: Not on file   Highest education level: Not on file  Occupational History   Occupation: Habilitation tech  Tobacco Use   Smoking status: Every Day    Packs/day: 1.00    Years: 34.00    Pack years: 34.00    Types: Cigarettes   Smokeless tobacco: Never   Tobacco comments:    Tobacco info given 10/11/15  Vaping Use   Vaping Use: Never used  Substance and Sexual Activity   Alcohol use: Not on file    Comment: rare use   Drug use: No   Sexual  activity: Never    Birth control/protection: None  Other Topics Concern   Not on file  Social History Narrative   Not on file   Social Determinants of Radio broadcast assistant  Strain: Not on file  Food Insecurity: Not on file  Transportation Needs: Not on file  Physical Activity: Not on file  Stress: Not on file  Social Connections: Not on file  Intimate Partner Violence: Not on file    Review of Systems:    Constitutional: No weight loss, fever or chills Cardiovascular: No chest pain Respiratory: No SOB  Gastrointestinal: See HPI and otherwise negative   Physical Exam:  Vital signs: BP 130/70   Pulse 88   Ht _0  (1.651 m)   Wt 192 lb (87.1 kg)   BMI 31.95 kg/m    Constitutional:   Pleasant AA female appears to be in NAD, Well developed, Well nourished, alert and cooperative Respiratory: Respirations even and unlabored. Lungs clear to auscultation bilaterally.   No wheezes, crackles, or rhonchi.  Cardiovascular: Normal S1, S2. No MRG. Regular rate and rhythm. No peripheral edema, cyanosis or pallor.  Gastrointestinal:  Soft, nondistended, nontender. No rebound or guarding. Normal bowel sounds. No appreciable masses or hepatomegaly. Rectal:  Not performed.  Psychiatric: Oriented to person, place and time. Demonstrates good judgement and reason without abnormal affect or behaviors.  RELEVANT LABS AND IMAGING: CBC    Component Value Date/Time   WBC 10.5 04/29/2020 1457   RBC 5.11 04/29/2020 1457   HGB 11.8 (L) 04/29/2020 1457   HCT 37.2 04/29/2020 1457   PLT 300.0 04/29/2020 1457   MCV 72.7 (L) 04/29/2020 1457   MCV 69.7 (A) 04/08/2016 1411   MCH 22.3 (L) 11/16/2019 0857   MCHC 31.6 04/29/2020 1457   RDW 17.8 (H) 04/29/2020 1457   LYMPHSABS 1.6 02/05/2015 0310   MONOABS 0.7 02/05/2015 0310   EOSABS 0.1 02/05/2015 0310   BASOSABS 0.0 02/05/2015 0310    CMP     Component Value Date/Time   NA 138 02/19/2021 1011   K 4.0 02/19/2021 1011   CL 105  02/19/2021 1011   CO2 24 02/19/2021 1011   GLUCOSE 153 (H) 02/19/2021 1011   BUN 9 02/19/2021 1011   CREATININE 0.61 02/19/2021 1011   CREATININE 0.87 04/08/2016 1350   CALCIUM 9.7 02/19/2021 1011   PROT 6.3 02/19/2021 1011   ALBUMIN 3.8 02/19/2021 1011   AST 14 02/19/2021 1011   ALT 25 02/19/2021 1011   ALKPHOS 140 (H) 02/19/2021 1011   BILITOT 0.4 02/19/2021 1011   GFRNONAA >60 02/05/2015 0310   GFRAA >60 02/05/2015 0310    Assessment: 1.  Bile salt diarrhea: Diarrhea is more formed with cholestyramine or Kaopectate but patient had a lot of lower abdominal cramping pain; consider IBS overlap 2.  Lower abdominal cramping: See above  Plan: 1.  Encouraged the patient to stop the Kaopectate and use her Cholestyramine.  She tells me half a packet is what works for her, would recommend she do this on a daily basis. 2.  In addition will prescribe Dicyclomine 20 mg which she can take every 6 hours, 20 to 30 minutes before meals and at bedtime if she needs to, but recommend she start by taking 1 in the morning and see how this goes as most of her problems are in the morning hours.  If this does not help then she can try taking 1 at night and in the morning. 3.  Patient to follow in clinic with me in 2 months or sooner if necessary.  Ellouise Newer, PA-C [facility] 04/01/2021, 9:09 AM  Cc: Darreld Mclean, MD

## 2021-04-01 NOTE — Patient Instructions (Signed)
We have sent the following medications to your pharmacy for you to pick up at your convenience: Dicyclomine 20 mg every 6 hours as needed. Can try taking it in the morning with the Cholestyramine and added nightly if not working.   If you are age 66 or older, your body mass index should be between 23-30. Your Body mass index is 31.95 kg/m. If this is out of the aforementioned range listed, please consider follow up with your Primary Care Provider.  If you are age 34 or younger, your body mass index should be between 19-25. Your Body mass index is 31.95 kg/m. If this is out of the aformentioned range listed, please consider follow up with your Primary Care Provider.   __________________________________________________________  The [facility] providers would like to encourage you to use [facility] to communicate with providers for non-urgent requests or questions.  Due to long hold times on the telephone, sending your provider a message by [facility] may be a faster and more efficient way to get a response.  Please allow 48 business hours for a response.  Please remember that this is for non-urgent requests.

## 2021-04-16 ENCOUNTER — Other Ambulatory Visit: Payer: Self-pay | Admitting: Family Medicine

## 2021-04-24 ENCOUNTER — Other Ambulatory Visit: Payer: Self-pay

## 2021-04-24 ENCOUNTER — Encounter: Payer: Self-pay | Admitting: Obstetrics

## 2021-04-24 ENCOUNTER — Ambulatory Visit (INDEPENDENT_AMBULATORY_CARE_PROVIDER_SITE_OTHER): Payer: Medicare HMO | Admitting: Obstetrics

## 2021-04-24 VITALS — BP 182/75 | HR 106 | Wt 192.9 lb

## 2021-04-24 DIAGNOSIS — N390 Urinary tract infection, site not specified: Secondary | ICD-10-CM

## 2021-04-24 DIAGNOSIS — Z4689 Encounter for fitting and adjustment of other specified devices: Secondary | ICD-10-CM | POA: Diagnosis not present

## 2021-04-24 DIAGNOSIS — B373 Candidiasis of vulva and vagina: Secondary | ICD-10-CM | POA: Diagnosis not present

## 2021-04-24 DIAGNOSIS — E669 Obesity, unspecified: Secondary | ICD-10-CM

## 2021-04-24 DIAGNOSIS — E139 Other specified diabetes mellitus without complications: Secondary | ICD-10-CM | POA: Diagnosis not present

## 2021-04-24 DIAGNOSIS — Z716 Tobacco abuse counseling: Secondary | ICD-10-CM | POA: Diagnosis not present

## 2021-04-24 DIAGNOSIS — N814 Uterovaginal prolapse, unspecified: Secondary | ICD-10-CM

## 2021-04-24 DIAGNOSIS — B3731 Acute candidiasis of vulva and vagina: Secondary | ICD-10-CM

## 2021-04-24 DIAGNOSIS — I10 Essential (primary) hypertension: Secondary | ICD-10-CM

## 2021-04-24 LAB — POCT URINALYSIS DIPSTICK
Bilirubin, UA: NEGATIVE
Glucose, UA: NEGATIVE
Ketones, UA: NEGATIVE
Protein, UA: NEGATIVE
Spec Grav, UA: 1.01 (ref 1.010–1.025)
Urobilinogen, UA: 0.2 E.U./dL
pH, UA: 6 (ref 5.0–8.0)

## 2021-04-24 MED ORDER — NITROFURANTOIN MONOHYD MACRO 100 MG PO CAPS: 100.0000 mg | ORAL_CAPSULE | Freq: Two times a day (BID) | ORAL | 2 refills | Status: DC

## 2021-04-24 MED ORDER — FLUCONAZOLE 200 MG PO TABS: 200.0000 mg | ORAL_TABLET | ORAL | 2 refills | Status: DC

## 2021-04-24 NOTE — Progress Notes (Signed)
New Pt is in the office to establish care Pt reports last pap smear was this year with Dr. Garwin Brothers; will get records sent Last mammogram 10-29-2020 Pt reports vaginal itching Pt reports hx of prolapsed uterus.

## 2021-04-24 NOTE — Progress Notes (Addendum)
Patient ID: Bailey [name], female   DOB: 08/13/1956, 65 y.o.   MRN: [id number]  Chief Complaint  Patient presents with   New Patient (Initial Visit)    HPI Bailey [name] is a 65 y.o. female.  Complains of prolapse of bladder.  Denies pelvic pain, dysuria or vaginal discharge.  She does c/o urinary frequency and vaginal itching. HPI  Past Medical History:  Diagnosis Date   Arthritis    Cataract    COPD (chronic obstructive pulmonary disease) ([place])    Diabetes mellitus without complication ([place])    Fibrocystic breast disease July 2016   GERD (gastroesophageal reflux disease)    Hyperlipidemia    Hypertension     Past Surgical History:  Procedure Laterality Date   BREAST BIOPSY Right 04/19/2015   benign   CATARACT EXTRACTION     CHOLECYSTECTOMY     COLONOSCOPY     UTERINE FIBROID SURGERY      Family History  Problem Relation Age of Onset   Diabetes Mother    Stroke Mother    Dementia Mother    Heart disease Father    Diabetes Father    Diabetes Brother    Dementia Brother    Renal Disease Brother    Diabetes Sister    Narcolepsy Sister    Arthritis Sister    Diabetes Sister    Obesity Sister    Diabetes Brother    Heart disease Brother    Cancer Brother        pancreatic   Diabetes Brother    Diabetes Brother    Stomach cancer Sister    Colon cancer Neg Hx    Esophageal cancer Neg Hx    Rectal cancer Neg Hx     Social History Social History   Tobacco Use   Smoking status: Every Day    Packs/day: 1.00    Years: 34.00    Pack years: 34.00    Types: Cigarettes   Smokeless tobacco: Never   Tobacco comments:    Tobacco info given 10/11/15  Vaping Use   Vaping Use: Never used  Substance Use Topics   Alcohol use: Not Currently   Drug use: No    No Known Allergies  Current Outpatient Medications  Medication Sig Dispense Refill   Accu-Chek Softclix Lancets lancets Use to check blood sugar once a day. Dx code: E11.9 100 each 1    atorvastatin (LIPITOR) 40 MG tablet TAKE 1 TABLET EVERY DAY 90 tablet 1   blood glucose meter kit and supplies KIT Dispense based on patient and insurance preference. Use up to four times daily as directed. (FOR ICD-9 250.00, 250.01). 1 each 0   Blood Glucose Monitoring Suppl (ACCU-CHEK GUIDE ME) w/Device KIT Use to check blood sugar once a day. Dx code: E11.9 1 kit 0   cholestyramine (QUESTRAN) 4 g packet DISSOLVE & TAKE 1 POWDER PACKET BY MOUTH TWICE DAILY 180 packet 3   dicyclomine (BENTYL) 20 MG tablet Take 1 tablet (20 mg total) by mouth every 6 (six) hours. 60 tablet 5   glimepiride (AMARYL) 4 MG tablet TAKE 2 TABLETS EVERY DAY WITH BREAKFAST 180 tablet 1   glucose blood (ACCU-CHEK GUIDE) test strip USE ONE TIME DAILY TO TEST BLOOD SUGAR 100 strip 1   INVOKANA 100 MG TABS tablet TAKE 1 TABLET BY MOUTH ONCE DAILY BEFORE BREAKFAST 90 tablet 1   lisinopril (ZESTRIL) 20 MG tablet Take 1 tablet (20 mg total) by mouth daily. 90 tablet 3  metFORMIN (GLUCOPHAGE) 1000 MG tablet TAKE 1 TABLET BY MOUTH TWICE DAILY WITH MEALS. APPOINTMENT REQUIRED FOR FUTURE REFILLS. 180 tablet 1   pantoprazole (PROTONIX) 40 MG tablet TAKE 1 TABLET TWICE DAILY 180 tablet 1   acetaminophen (TYLENOL) 500 MG tablet Take 1,000 mg by mouth every 6 (six) hours as needed (pain). Reported on 04/08/2016 (Patient not taking: Reported on 04/24/2021)     COVID-19 mRNA Vac-TriS, Pfizer, (PFIZER-BIONT COVID-19 VAC-TRIS) SUSP injection Inject into the muscle. 0.3 mL 0   cyclobenzaprine (FLEXERIL) 5 MG tablet TAKE ONE TABLET BY MOUTH AT BEDTIME AS NEEDED FOR NECK PAIN (Patient not taking: Reported on 04/24/2021) 30 tablet 0   hydrochlorothiazide (MICROZIDE) 12.5 MG capsule Take 1 capsule (12.5 mg total) by mouth daily. (Patient not taking: Reported on 04/01/2021) 90 capsule 1   meloxicam (MOBIC) 15 MG tablet TAKE ONE TABLET BY MOUTH DAILY.   USE  AS  NEEDED  FOR  KNEE  AND  BACK  PAIN (Patient not taking: Reported on 04/24/2021) 30 tablet 2    naproxen (NAPROSYN) 500 MG tablet TAKE 1 TABLET BY MOUTH TWICE DAILY WITH A MEAL. (Patient not taking: Reported on 04/24/2021) 60 tablet 2   sucralfate (CARAFATE) 1 GM/10ML suspension Take 10 mLs (1 g total) by mouth 4 (four) times daily -  with meals and at bedtime. (Patient not taking: Reported on 04/24/2021) 420 mL 3   triamcinolone cream (KENALOG) 0.1 % Apply 1 application topically 2 (two) times daily. Use as needed on dry or scaly areas (Patient not taking: Reported on 04/24/2021) 60 g 1   No current facility-administered medications for this visit.    Review of Systems Review of Systems Constitutional: negative for fatigue and weight loss Respiratory: negative for cough and wheezing Cardiovascular: negative for chest pain, fatigue and palpitations Gastrointestinal: negative for abdominal pain and change in bowel habits Genitourinary:positive for uterine prolapse, urinary frequency and vaginal itching Integument/breast: negative for nipple discharge Musculoskeletal:negative for myalgias Neurological: negative for gait problems and tremors Behavioral/Psych: negative for abusive relationship, depression Endocrine: negative for temperature intolerance      Blood pressure (!) 182/75, pulse (!) 106, weight 192 lb 14.4 oz (87.5 kg).  Urinalysis: Results for EVALINE, WALTMAN (MRN [id number]) as of 04/24/2021 13:51  Ref. Range 04/24/2021 11:11  Bilirubin, UA Unknown neg  Clarity, UA Unknown clear  Color, UA Unknown yellow  Glucose Latest Ref Range: Negative  Negative  Ketones, UA Unknown neg  Leukocytes,UA Latest Ref Range: Negative  Large (3+) (A)  Nitrite, UA Unknown large  pH, UA Latest Ref Range: 5.0 - 8.0  6.0  Protein,UA Latest Ref Range: Negative  Negative  Specific Gravity, UA Latest Ref Range: 1.010 - 1.025  1.010  Urobilinogen, UA Latest Ref Range: 0.2 or 1.0 E.U./dL 0.2  RBC, UA Unknown large   Physical Exam Physical Exam General:   Alert and no distress  Skin:   no rash or  abnormalities  Lungs:   clear to auscultation bilaterally  Heart:   regular rate and rhythm, S1, S2 normal, no murmur, click, rub or gallop  Breasts:   normal without suspicious masses, skin or nipple changes or axillary nodes  Abdomen:  normal findings: no organomegaly, soft, non-tender and no hernia  Pelvis:  External genitalia: normal general appearance Urinary system: urethral meatus normal and bladder without fullness, nontender Vaginal: normal without tenderness, induration or masses Cervix: normal appearance Adnexa: normal bimanual exam Uterus: anteverted and non-tender, normal size    I have spent  a total of 30 minutes of face-to-face time, excluding clinical staff time, reviewing notes and preparing to see patient, ordering tests and/or medications, and counseling the patient.   Data Reviewed CT Scan  Assessment     1. Uterine prolapse - Procidentia Rx: - Ambulatory referral to Urogynecology  2. Encounter for fitting and adjustment of pessary - 2.5 inch Gelhorn pessary fitted and adjusted ( see note )  3. UTI - Macrobid Rx  4. 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 HTN (hypertension), benign - managed by PCP  7. Obesity (BMI 30.0-34.9) - weight loss with the aid of dietary changes, exercise and behavioral modification recommended  8. Tobacco abuse counseling - cessation with the aid of medication and behavioral modification recommended     Plan   Follow up in 2 weeks   Shelly Bombard, MD 04/24/2021 9:47 AM     PESSARY FITTING AND PLACEMENT During pelvic exam, patient was examined. Her vaginal introitus size, vaginal length, and prolapse stage were used to guide selection of pessary type and size. After evaluation, a 2.5 inch Gellhorn flexible pessary fitting guide was placed and patient walked around room with it in place.  No prolapse was noted in standing, or in lithotomy position even after  Valsalva.  The fitter was removed and the regular  2.5 inch flexible Gellhorn pessary was inserted.   A/P:  Uterine procidentia.  Gellhorn pessary fitted.   Shelly Bombard, MD 04/24/2021

## 2021-04-25 ENCOUNTER — Ambulatory Visit (INDEPENDENT_AMBULATORY_CARE_PROVIDER_SITE_OTHER): Payer: Medicare HMO | Admitting: Obstetrics

## 2021-04-25 ENCOUNTER — Encounter: Payer: Self-pay | Admitting: Obstetrics

## 2021-04-25 VITALS — BP 163/79 | HR 81

## 2021-04-25 DIAGNOSIS — Z4689 Encounter for fitting and adjustment of other specified devices: Secondary | ICD-10-CM | POA: Diagnosis not present

## 2021-04-25 DIAGNOSIS — N814 Uterovaginal prolapse, unspecified: Secondary | ICD-10-CM | POA: Diagnosis not present

## 2021-04-25 NOTE — Progress Notes (Signed)
PESSARY FITTING AND PLACEMENT During pelvic exam, patient was examined. Her vaginal introitus size, vaginal length, and prolapse stage wereused to guide selection of pessary type and size. After evaluation, a 3.5 inch ring with support pessary fitting guide was placed and patient walked around room with it in place.  No prolapse was noted in standing, or in lithotomy position even after Valsalva.  The fitter was removed and the regular  3.5 inch with ring support pessary was inserted.  She then went to the restroom to void and the pessary came out.  Several styles and sizes of pessaries were tried but were expelled each time with valsava.  A/P:  Uterine prolapse.  Unsuccessful pessary fitting.  Will schedule patient for consultation for surgical management ASAP.   Shelly Bombard, MD 04/25/2021 8:41 AM

## 2021-04-28 LAB — URINE CULTURE

## 2021-04-30 ENCOUNTER — Other Ambulatory Visit: Payer: Self-pay | Admitting: Obstetrics

## 2021-04-30 DIAGNOSIS — N390 Urinary tract infection, site not specified: Secondary | ICD-10-CM

## 2021-04-30 MED ORDER — SULFAMETHOXAZOLE-TRIMETHOPRIM 800-160 MG PO TABS
1.0000 | ORAL_TABLET | Freq: Two times a day (BID) | ORAL | 0 refills | Status: DC
Start: 2021-04-30 — End: 2021-06-29

## 2021-05-08 ENCOUNTER — Ambulatory Visit: Payer: Medicare HMO | Admitting: Obstetrics

## 2021-05-08 ENCOUNTER — Other Ambulatory Visit: Payer: Self-pay

## 2021-05-19 NOTE — Progress Notes (Signed)
Reviewed and agree with documentation and assessment and plan. K. Veena Yarnell Arvidson , MD   

## 2021-05-19 NOTE — Progress Notes (Signed)
[facility] New Patient Evaluation and Consultation  Referring Provider: Shelly Bombard, MD PCP: Darreld Mclean, MD Date of Service: 05/20/2021  SUBJECTIVE Chief Complaint: New Patient (Initial Visit) Marland KitchenMacaria [name] is a 65 y.o. female here for a consult on prolapse.)  History of Present Illness: Bailey [name] is a 65 y.o. Black or African-American female seen in consultation at the request of Dr. Jodi Mourning for evaluation of prolapse.    Review of records from Dr Jodi Mourning significant for: Has prolapse and attempted pessary placement but was expelled. Ring with support and gellhorn both tried.   Urinary Symptoms: Leaks urine with with movement to the bathroom when bladder is full.  Leaks a few times a day Pad use: 1 pads per day.   She is not bothered by her UI symptoms.  Day time voids- about every hour.  Nocturia: every 2 hours  Voiding dysfunction: she empties her bladder well.  does not use a catheter to empty bladder.  When urinating, she feels the need to urinate multiple times in a row Drinks: drinks coffee and water all day  UTIs: 1 UTI's in the last year.   Denies history of blood in urine and kidney or bladder stones  Pelvic Organ Prolapse Symptoms:                  She Admits to a feeling of a bulge the vaginal area. It has been present for 15 years.  She Admits to seeing a bulge.  This bulge is bothersome. Has tried several pessaries at two different offices and they all fell out. Gellhorn stayed in the longest but only until she was home and went to the bathroom.   Bowel Symptom: Bowel movements: 1 time(s) per day Stool consistency: soft  Straining: no.  Splinting: no.  Incomplete evacuation: no.  She Denies accidental bowel leakage / fecal incontinence Bowel regimen: Takes cholestyramine to keep her bowel movements more firm   Sexual Function Sexually active: no- but would like to be if she had a partner.    Pelvic Pain Denies  pelvic pain   Past Medical History:  Past Medical History:  Diagnosis Date   Arthritis    Cataract    COPD (chronic obstructive pulmonary disease) ([place])    Diabetes mellitus without complication ([place])    Fibrocystic breast disease July 2016   GERD (gastroesophageal reflux disease)    Hyperlipidemia    Hypertension      Past Surgical History:   Past Surgical History:  Procedure Laterality Date   BREAST BIOPSY Right 04/19/2015   benign   CATARACT EXTRACTION     CHOLECYSTECTOMY     COLONOSCOPY     GALLBLADDER SURGERY     UTERINE FIBROID SURGERY       Past OB/GYN History: OB History  Gravida Para Term Preterm AB Living  2 2 2     2   SAB IAB Ectopic Multiple Live Births          2    # Outcome Date GA Lbr Len/2nd Weight Sex Delivery Anes PTL Lv  2 Term      Vag-Spont     1 Term      Vag-Spont      Menopausal: Yes, at age 71, Denies vaginal bleeding since menopause Last pap smear was 2018- negative.     Medications: She has a current medication list which includes the following prescription(s): accu-chek softclix lancets, acetaminophen, atorvastatin, blood glucose meter kit and  supplies, accu-chek guide me, cholestyramine, pfizer-biont covid-19 vac-tris, cyclobenzaprine, dicyclomine, glimepiride, accu-chek guide, hydrochlorothiazide, invokana, lisinopril, meloxicam, metformin, naproxen, pantoprazole, sucralfate, sulfamethoxazole-trimethoprim, and triamcinolone cream.   Allergies: Patient has No Known Allergies.   Social History:  Social History   Tobacco Use   Smoking status: Every Day    Packs/day: 1.00    Years: 34.00    Pack years: 34.00    Types: Cigarettes   Smokeless tobacco: Never   Tobacco comments:    Tobacco info given 10/11/15  Vaping Use   Vaping Use: Never used  Substance Use Topics   Alcohol use: Not Currently   Drug use: No    Relationship status: widowed She lives with self.   She is employed- Personal assistant. Regular exercise:  No History of abuse: No  Family History:   Family History  Problem Relation Age of Onset   Diabetes Mother    Stroke Mother    Dementia Mother    Heart disease Father    Diabetes Father    Diabetes Brother    Dementia Brother    Renal Disease Brother    Diabetes Sister    Narcolepsy Sister    Arthritis Sister    Diabetes Sister    Obesity Sister    Diabetes Brother    Heart disease Brother    Cancer Brother        pancreatic   Diabetes Brother    Diabetes Brother    Stomach cancer Sister    Colon cancer Neg Hx    Esophageal cancer Neg Hx    Rectal cancer Neg Hx      Review of Systems: Review of Systems  Constitutional:  Negative for fever, malaise/fatigue and weight loss.  Respiratory:  Negative for cough, shortness of breath and wheezing.   Cardiovascular:  Negative for chest pain, palpitations and leg swelling.  Gastrointestinal:  Negative for abdominal pain and blood in stool.  Genitourinary:  Negative for dysuria.  Musculoskeletal:  Negative for myalgias.  Skin:  Negative for rash.  Neurological:  Positive for headaches. Negative for dizziness.  Endo/Heme/Allergies:  Does not bruise/bleed easily.  Psychiatric/Behavioral:  Negative for depression. The patient is not nervous/anxious.     OBJECTIVE Physical Exam: Vitals:   05/20/21 1130  BP: (!) 142/59  Pulse: 84  Weight: 194 lb (88 kg)  Height: $Remove'5\' 5"'eCobIOH$  (1.651 m)    Physical Exam Constitutional:      General: She is not in acute distress. Pulmonary:     Effort: Pulmonary effort is normal.  Abdominal:     General: There is no distension.     Palpations: Abdomen is soft.     Tenderness: There is no abdominal tenderness. There is no rebound.  Musculoskeletal:        General: No swelling. Normal range of motion.  Skin:    General: Skin is warm and dry.     Findings: No rash.  Neurological:     Mental Status: She is alert and oriented to person, place, and time.  Psychiatric:        Mood and Affect:  Mood normal.        Behavior: Behavior normal.     GU / Detailed Urogynecologic Evaluation:  Pelvic Exam: Normal external female genitalia; Bartholin's and Skene's glands normal in appearance; urethral meatus normal in appearance, no urethral masses or discharge.   CST: negative  Complete procedentia noted. Normal vaginal mucosa with atrophy. Cervix normal appearance. Uterus normal single, nontender. Adnexa no mass,  fullness, tenderness.     Pelvic floor strength III/V  Pelvic floor musculature: Right levator non-tender, Right obturator non-tender, Left levator non-tender, Left obturator non-tender  POP-Q:   POP-Q  3                                            Aa   6                                           Ba  6                                              C   6                                            Gh  3                                            Pb  6                                            tvl   3                                            Ap  5                                            Bp  -3                                              D     Rectal Exam:  Normal external rectum  Post-Void Residual (PVR) by Bladder Scan: In order to evaluate bladder emptying, we discussed obtaining a postvoid residual and she agreed to this procedure.  Procedure: The ultrasound unit was placed on the patient's abdomen in the suprapubic region after the patient had voided. A PVR of 89 ml was obtained by bladder scan.  Laboratory Results: POC urine: + glucose   ASSESSMENT AND PLAN Bailey [name] is a 65 y.o. with:  1. Uterovaginal prolapse, complete   2. Prolapse of anterior vaginal wall   3. Prolapse of posterior vaginal wall   4. Urinary frequency    Stage IV anterior, Stage IV posterior, Stage IV apical prolapse For treatment of pelvic organ prolapse, we discussed options for management including expectant management, conservative management, and surgical  management, such as Kegels, a pessary, pelvic  floor physical therapy, and specific surgical procedures. - She is interested in surgery. We discussed two options for prolapse repair:  1) vaginal repair without mesh - Pros - safer, no mesh complications - Cons - not as strong as mesh repair, higher risk of recurrence  2) laparoscopic repair with mesh - Pros - stronger, better long-term success - Cons - risks of mesh implant (erosion into vagina or bladder, adhering to the rectum, pain) - these risks are lower than with a vaginal mesh but still exist - Due to her history of smoking and poorly controlled DM (although most recent A1c <8), would recommend vaginal procedure without mesh to reduce risk of complications from mesh.  - Will have her undergo urodynamic testing to assess for occult incontinence.   2. Urinary urgency/ frequency - Discussed limiting bladder irritants, specifically coffee which she drinks all day. She is not currently bothered by her occasional leakage and urinary frequency.   Return for urodynamic testing  Jaquita Folds, MD   Medical Decision Making:  - Reviewed/ ordered a clinical laboratory test - Reviewed/ ordered medicine test - Review and summation of prior records

## 2021-05-20 ENCOUNTER — Ambulatory Visit (INDEPENDENT_AMBULATORY_CARE_PROVIDER_SITE_OTHER): Payer: Medicare HMO | Admitting: Obstetrics and Gynecology

## 2021-05-20 ENCOUNTER — Encounter: Payer: Self-pay | Admitting: Obstetrics and Gynecology

## 2021-05-20 ENCOUNTER — Other Ambulatory Visit: Payer: Self-pay

## 2021-05-20 VITALS — BP 142/59 | HR 84 | Ht 65.0 in | Wt 194.0 lb

## 2021-05-20 DIAGNOSIS — N816 Rectocele: Secondary | ICD-10-CM | POA: Diagnosis not present

## 2021-05-20 DIAGNOSIS — N8110 Cystocele, unspecified: Secondary | ICD-10-CM | POA: Diagnosis not present

## 2021-05-20 DIAGNOSIS — R350 Frequency of micturition: Secondary | ICD-10-CM | POA: Diagnosis not present

## 2021-05-20 DIAGNOSIS — N813 Complete uterovaginal prolapse: Secondary | ICD-10-CM | POA: Diagnosis not present

## 2021-05-20 LAB — POCT URINALYSIS DIPSTICK
Appearance: NORMAL
Bilirubin, UA: NEGATIVE
Blood, UA: NEGATIVE
Glucose, UA: POSITIVE — AB
Ketones, UA: NEGATIVE
Leukocytes, UA: NEGATIVE
Nitrite, UA: NEGATIVE
Protein, UA: NEGATIVE
Spec Grav, UA: 1.02 (ref 1.010–1.025)
Urobilinogen, UA: 0.2 E.U./dL
pH, UA: 5.5 (ref 5.0–8.0)

## 2021-05-20 NOTE — Patient Instructions (Signed)

## 2021-06-04 ENCOUNTER — Encounter: Payer: Self-pay | Admitting: Physician Assistant

## 2021-06-04 ENCOUNTER — Ambulatory Visit (INDEPENDENT_AMBULATORY_CARE_PROVIDER_SITE_OTHER): Payer: Medicare HMO | Admitting: Physician Assistant

## 2021-06-04 VITALS — BP 122/72 | HR 80 | Ht 65.0 in | Wt 196.1 lb

## 2021-06-04 DIAGNOSIS — K529 Noninfective gastroenteritis and colitis, unspecified: Secondary | ICD-10-CM

## 2021-06-04 DIAGNOSIS — R159 Full incontinence of feces: Secondary | ICD-10-CM | POA: Diagnosis not present

## 2021-06-04 DIAGNOSIS — R109 Unspecified abdominal pain: Secondary | ICD-10-CM

## 2021-06-04 MED ORDER — DICYCLOMINE HCL 20 MG PO TABS: 20.0000 mg | ORAL_TABLET | ORAL | 3 refills | Status: DC

## 2021-06-04 NOTE — Progress Notes (Signed)
Chief Complaint: Follow-up chronic diarrhea  HPI:    Bailey [name] is a 65 year old African-American female with a past medical history of COPD and others listed below, known to Dr. Silverio Decamp, who returns to clinic today for follow-up of her diarrhea.    11/07/2015 colonoscopy with colonic diverticulosis, random colon biopsies were negative for microscopic colitis.  Repeat recommended in 10 years.   05/09/2019 patient seen in clinic by Dr. Silverio Decamp for reflux.  Noted that her sister had stomach cancer in her 73s and her brother was diagnosed with pancreatic cancer.  At that time her diarrhea was better controlled with cholestyramine.  Was recommended she have an EGD.    05/11/2019 EGD with nodular mucosa in the gastric antrum.  Pathology showed ulcer.    02/19/2021 CMP with glucose elevated at 153 and alk phos minimally elevated at 140 (this has fluctuated in the past as well).    04/01/2021 patient describes she ran out of her cholestyramine the year prior and exchanged her for Kaopectate and it was working about the same.  At that time described having to run to the bathroom with lower abdominal cramping the second then she ate food in the morning.  At that time encouraged the patient to stop the Kaopectate and use her cholestyramine.  She is using a half a packet on a daily basis.  Also prescribed Dicyclomine 20 mg which we discussed taking 1 in the morning and seeing how that helped with her problems.    Today, the patient tells me that she continues with similar problems.  She is currently drinking about a half a packet of Cholestyramine together in the morning and then drinking the rest of the packet throughout the day.  Tells me that if she has more than this it will "bind me up".  Describes that she has had continual abdominal cramping which does not seem much helped by the Dicyclomine 20 mg in the morning and in the evening notes this typically in her lower abdomen.  Tells me that she has also had  some issues with incontinence but also describes a history of some pelvic floor dysfunction and uterine fibroids.  Apparently she is going to have a hysterectomy and pelvic floor work done in the beginning of the new year.    Patient does work a job where sometimes she is on night shift and other times day shift, also helps take care of her sister throughout the day.  A lot of stress currently.    Denies fever, chills, weight loss, blood in her stool or symptoms that awaken her from sleep.  Past Medical History:  Diagnosis Date   Arthritis    Cataract    COPD (chronic obstructive pulmonary disease) ([place])    Diabetes mellitus without complication ([place])    Fibrocystic breast disease July 2016   GERD (gastroesophageal reflux disease)    Hyperlipidemia    Hypertension     Past Surgical History:  Procedure Laterality Date   BREAST BIOPSY Right 04/19/2015   benign   CATARACT EXTRACTION     CHOLECYSTECTOMY     COLONOSCOPY     GALLBLADDER SURGERY     UTERINE FIBROID SURGERY      Current Outpatient Medications  Medication Sig Dispense Refill   Accu-Chek Softclix Lancets lancets Use to check blood sugar once a day. Dx code: E11.9 100 each 1   acetaminophen (TYLENOL) 500 MG tablet Take 1,000 mg by mouth every 6 (six) hours as needed (pain). Reported  on 04/08/2016     atorvastatin (LIPITOR) 40 MG tablet TAKE 1 TABLET EVERY DAY 90 tablet 1   blood glucose meter kit and supplies KIT Dispense based on patient and insurance preference. Use up to four times daily as directed. (FOR ICD-9 250.00, 250.01). 1 each 0   Blood Glucose Monitoring Suppl (ACCU-CHEK GUIDE ME) w/Device KIT Use to check blood sugar once a day. Dx code: E11.9 1 kit 0   cholestyramine (QUESTRAN) 4 g packet DISSOLVE & TAKE 1 POWDER PACKET BY MOUTH TWICE DAILY 180 packet 3   cyclobenzaprine (FLEXERIL) 5 MG tablet TAKE ONE TABLET BY MOUTH AT BEDTIME AS NEEDED FOR NECK PAIN (Patient taking differently: as needed.) 30 tablet 0    dicyclomine (BENTYL) 20 MG tablet Take 1 tablet (20 mg total) by mouth every 6 (six) hours. 60 tablet 5   glimepiride (AMARYL) 4 MG tablet TAKE 2 TABLETS EVERY DAY WITH BREAKFAST 180 tablet 1   glucose blood (ACCU-CHEK GUIDE) test strip USE ONE TIME DAILY TO TEST BLOOD SUGAR 100 strip 1   hydrochlorothiazide (MICROZIDE) 12.5 MG capsule Take 1 capsule (12.5 mg total) by mouth daily. 90 capsule 1   INVOKANA 100 MG TABS tablet TAKE 1 TABLET BY MOUTH ONCE DAILY BEFORE BREAKFAST 90 tablet 1   lisinopril (ZESTRIL) 20 MG tablet Take 1 tablet (20 mg total) by mouth daily. 90 tablet 3   metFORMIN (GLUCOPHAGE) 1000 MG tablet TAKE 1 TABLET BY MOUTH TWICE DAILY WITH MEALS. APPOINTMENT REQUIRED FOR FUTURE REFILLS. 180 tablet 1   pantoprazole (PROTONIX) 40 MG tablet TAKE 1 TABLET TWICE DAILY 180 tablet 1   sucralfate (CARAFATE) 1 GM/10ML suspension Take 10 mLs (1 g total) by mouth 4 (four) times daily -  with meals and at bedtime. 420 mL 3   sulfamethoxazole-trimethoprim (BACTRIM DS) 800-160 MG tablet Take 1 tablet by mouth 2 (two) times daily. 14 tablet 0   triamcinolone cream (KENALOG) 0.1 % Apply 1 application topically 2 (two) times daily. Use as needed on dry or scaly areas 60 g 1   No current facility-administered medications for this visit.    Allergies as of 06/04/2021   (No Known Allergies)    Family History  Problem Relation Age of Onset   Diabetes Mother    Stroke Mother    Dementia Mother    Heart disease Father    Diabetes Father    Diabetes Brother    Dementia Brother    Renal Disease Brother    Diabetes Sister    Narcolepsy Sister    Arthritis Sister    Diabetes Sister    Obesity Sister    Diabetes Brother    Heart disease Brother    Cancer Brother        pancreatic   Diabetes Brother    Diabetes Brother    Stomach cancer Sister    Colon cancer Neg Hx    Esophageal cancer Neg Hx    Rectal cancer Neg Hx     Social History   Socioeconomic History   Marital status:  Widowed    Spouse name: Not on file   Number of children: 2   Years of education: Not on file   Highest education level: Not on file  Occupational History   Occupation: Habilitation tech  Tobacco Use   Smoking status: Every Day    Packs/day: 1.00    Years: 34.00    Pack years: 34.00    Types: Cigarettes   Smokeless tobacco: Never  Tobacco comments:    Tobacco info given 10/11/15  Vaping Use   Vaping Use: Never used  Substance and Sexual Activity   Alcohol use: Not Currently   Drug use: No   Sexual activity: Not Currently    Birth control/protection: None  Other Topics Concern   Not on file  Social History Narrative   Not on file   Social Determinants of Health   Financial Resource Strain: Not on file  Food Insecurity: Not on file  Transportation Needs: Not on file  Physical Activity: Not on file  Stress: Not on file  Social Connections: Not on file  Intimate Partner Violence: Not on file    Review of Systems:    Constitutional: No weight loss, fever or chills Cardiovascular: No chest pain Respiratory: No SOB  Gastrointestinal: See HPI and otherwise negative   Physical Exam:  Vital signs: BP 122/72   Pulse 80   Ht 5' 5"  (1.651 m)   Wt 196 lb 2 oz (89 kg)   BMI 32.64 kg/m    Constitutional:   Pleasant overweight AA female appears to be in NAD, Well developed, Well nourished, alert and cooperative Respiratory: Respirations even and unlabored. Lungs clear to auscultation bilaterally.   No wheezes, crackles, or rhonchi.  Cardiovascular: Normal S1, S2. No MRG. Regular rate and rhythm. No peripheral edema, cyanosis or pallor.  Gastrointestinal:  Soft, nondistended, mild lower abdominal ttp, No rebound or guarding. Normal bowel sounds. No appreciable masses or hepatomegaly. Rectal:  Not performed.  Psychiatric: Demonstrates good judgement and reason without abnormal affect or behaviors.  RELEVANT LABS AND IMAGING: CBC    Component Value Date/Time   WBC 10.5  04/29/2020 1457   RBC 5.11 04/29/2020 1457   HGB 11.8 (L) 04/29/2020 1457   HCT 37.2 04/29/2020 1457   PLT 300.0 04/29/2020 1457   MCV 72.7 (L) 04/29/2020 1457   MCV 69.7 (A) 04/08/2016 1411   MCH 22.3 (L) 11/16/2019 0857   MCHC 31.6 04/29/2020 1457   RDW 17.8 (H) 04/29/2020 1457   LYMPHSABS 1.6 02/05/2015 0310   MONOABS 0.7 02/05/2015 0310   EOSABS 0.1 02/05/2015 0310   BASOSABS 0.0 02/05/2015 0310    CMP     Component Value Date/Time   NA 138 02/19/2021 1011   K 4.0 02/19/2021 1011   CL 105 02/19/2021 1011   CO2 24 02/19/2021 1011   GLUCOSE 153 (H) 02/19/2021 1011   BUN 9 02/19/2021 1011   CREATININE 0.61 02/19/2021 1011   CREATININE 0.87 04/08/2016 1350   CALCIUM 9.7 02/19/2021 1011   PROT 6.3 02/19/2021 1011   ALBUMIN 3.8 02/19/2021 1011   AST 14 02/19/2021 1011   ALT 25 02/19/2021 1011   ALKPHOS 140 (H) 02/19/2021 1011   BILITOT 0.4 02/19/2021 1011   GFRNONAA >60 02/05/2015 0310   GFRAA >60 02/05/2015 0310    Assessment: 1.  Bile salt diarrhea: Longstanding, better with Cholestyramine; consider also some IBS overlap 2.  Lower abdominal cramping: Consider from fibroids+/- IBS 3.  Fecal incontinence: Issues with urgency and a lack of control; her fibroids are likely contributing/pressure on her pelvic floor  Plan: 1.  Asked the patient to increase her Dicyclomine to 20 mg 4 times daily, ideally 20 to 30 minutes before meals and at bedtime but due to her sometimes night shift recommend she take 1 tablet every 4-6 hours not exceeding 4 tablets in a 24-hour period of time.  Prescribed #120 with 3 refills. 2.  Continue Cholestyramine. 3.  Discussed with patient I believe a lot of her issues are stemming from her pelvic floor dysfunction and likely her fibroids causing some abdominal pain.  I hypothesized that many of these may be better after she has her hysterectomy.  For that reason would like her to wait until she has that done to come back and see [place], unless she  develops a new issue.  Put her in for recall appointment in 6 months.  She can call before then if she would like to see [place] sooner.  Ellouise Newer, PA-C [facility] 06/04/2021, 9:54 AM  Cc: Darreld Mclean, MD

## 2021-06-04 NOTE — Patient Instructions (Addendum)
We have sent the following medications to your pharmacy for you to pick up at your convenience: Dicyclomine 20 mg every 4-6 hours no more that 4 times within 24 hours.   Follow up in 5-6 months.  If you are age 67 or older, your body mass index should be between 23-30. Your Body mass index is 32.64 kg/m. If this is out of the aforementioned range listed, please consider follow up with your Primary Care Provider.  If you are age 60 or younger, your body mass index should be between 19-25. Your Body mass index is 32.64 kg/m. If this is out of the aformentioned range listed, please consider follow up with your Primary Care Provider.   __________________________________________________________  The [facility] providers would like to encourage you to use [facility] to communicate with providers for non-urgent requests or questions.  Due to long hold times on the telephone, sending your provider a message by [facility] may be a faster and more efficient way to get a response.  Please allow 48 business hours for a response.  Please remember that this is for non-urgent requests.

## 2021-06-09 ENCOUNTER — Other Ambulatory Visit: Payer: Self-pay | Admitting: Family Medicine

## 2021-06-09 DIAGNOSIS — E2839 Other primary ovarian failure: Secondary | ICD-10-CM

## 2021-06-10 ENCOUNTER — Other Ambulatory Visit: Payer: Medicare HMO

## 2021-06-15 DIAGNOSIS — Z20822 Contact with and (suspected) exposure to covid-19: Secondary | ICD-10-CM | POA: Diagnosis not present

## 2021-06-16 NOTE — Progress Notes (Signed)
Reviewed and agree with documentation and assessment and plan. K. Veena Odessia Asleson , MD   

## 2021-06-19 ENCOUNTER — Telehealth: Payer: Self-pay | Admitting: Family Medicine

## 2021-06-19 NOTE — Telephone Encounter (Signed)
Patient tested positive for covid 9/27, symptoms started 9/25. Patient was prescribed azithromycin 250 MG. Patient has an appointment 10/12 she would like suggestions regarding upcoming appointment. Please advise.

## 2021-06-23 ENCOUNTER — Emergency Department (HOSPITAL_COMMUNITY): Payer: Medicare HMO

## 2021-06-23 ENCOUNTER — Emergency Department (HOSPITAL_COMMUNITY)
Admission: EM | Admit: 2021-06-23 | Discharge: 2021-06-23 | Disposition: A | Discharge status: Home / Self Care | Payer: Medicare HMO | Attending: Emergency Medicine | Admitting: Emergency Medicine

## 2021-06-23 ENCOUNTER — Encounter (HOSPITAL_COMMUNITY): Payer: Self-pay | Admitting: Emergency Medicine

## 2021-06-23 ENCOUNTER — Other Ambulatory Visit: Payer: Self-pay

## 2021-06-23 DIAGNOSIS — Z79899 Other long term (current) drug therapy: Secondary | ICD-10-CM | POA: Diagnosis not present

## 2021-06-23 DIAGNOSIS — U071 COVID-19: Secondary | ICD-10-CM | POA: Diagnosis not present

## 2021-06-23 DIAGNOSIS — F17210 Nicotine dependence, cigarettes, uncomplicated: Secondary | ICD-10-CM | POA: Insufficient documentation

## 2021-06-23 DIAGNOSIS — I10 Essential (primary) hypertension: Secondary | ICD-10-CM | POA: Diagnosis not present

## 2021-06-23 DIAGNOSIS — Z7984 Long term (current) use of oral hypoglycemic drugs: Secondary | ICD-10-CM | POA: Insufficient documentation

## 2021-06-23 DIAGNOSIS — N3000 Acute cystitis without hematuria: Secondary | ICD-10-CM | POA: Insufficient documentation

## 2021-06-23 DIAGNOSIS — J449 Chronic obstructive pulmonary disease, unspecified: Secondary | ICD-10-CM | POA: Insufficient documentation

## 2021-06-23 DIAGNOSIS — R059 Cough, unspecified: Secondary | ICD-10-CM | POA: Diagnosis not present

## 2021-06-23 DIAGNOSIS — R051 Acute cough: Secondary | ICD-10-CM

## 2021-06-23 DIAGNOSIS — R109 Unspecified abdominal pain: Secondary | ICD-10-CM | POA: Diagnosis not present

## 2021-06-23 DIAGNOSIS — E11319 Type 2 diabetes mellitus with unspecified diabetic retinopathy without macular edema: Secondary | ICD-10-CM | POA: Diagnosis not present

## 2021-06-23 LAB — COMPREHENSIVE METABOLIC PANEL
ALT: 32 U/L (ref 0–44)
AST: 15 U/L (ref 15–41)
Albumin: 3.7 g/dL (ref 3.5–5.0)
Alkaline Phosphatase: 132 U/L — ABNORMAL HIGH (ref 38–126)
Anion gap: 10 (ref 5–15)
BUN: 10 mg/dL (ref 8–23)
CO2: 20 mmol/L — ABNORMAL LOW (ref 22–32)
Calcium: 9.9 mg/dL (ref 8.9–10.3)
Chloride: 106 mmol/L (ref 98–111)
Creatinine, Ser: 0.63 mg/dL (ref 0.44–1.00)
GFR, Estimated: >60 mL/min (ref >60)
Glucose, Bld: 179 mg/dL — ABNORMAL HIGH (ref 70–99)
Potassium: 3.8 mmol/L (ref 3.5–5.1)
Sodium: 136 mmol/L (ref 135–145)
Total Bilirubin: 0.3 mg/dL (ref 0.3–1.2)
Total Protein: 7.6 g/dL (ref 6.5–8.1)

## 2021-06-23 LAB — CBC
HCT: 43.8 % (ref 36.0–46.0)
Hemoglobin: 13.5 g/dL (ref 12.0–15.0)
MCH: 22.5 pg — ABNORMAL LOW (ref 26.0–34.0)
MCHC: 30.8 g/dL (ref 30.0–36.0)
MCV: 73.1 fL — ABNORMAL LOW (ref 80.0–100.0)
Platelets: 318 10*3/uL (ref 150–400)
RBC: 5.99 MIL/uL — ABNORMAL HIGH (ref 3.87–5.11)
RDW: 17.5 % — ABNORMAL HIGH (ref 11.5–15.5)
WBC: 9.5 10*3/uL (ref 4.0–10.5)
nRBC: 0 % (ref 0.0–0.2)

## 2021-06-23 LAB — URINALYSIS, ROUTINE W REFLEX MICROSCOPIC
Bacteria, UA: NONE SEEN
Bilirubin Urine: NEGATIVE
Glucose, UA: >=500 mg/dL — AB
Ketones, ur: NEGATIVE mg/dL
Nitrite: NEGATIVE
Protein, ur: 100 mg/dL — AB
RBC / HPF: >50 RBC/hpf — ABNORMAL HIGH (ref 0–5)
Specific Gravity, Urine: 1.026 (ref 1.005–1.030)
WBC, UA: >50 WBC/hpf — ABNORMAL HIGH (ref 0–5)
pH: 5 (ref 5.0–8.0)

## 2021-06-23 LAB — LIPASE, BLOOD: Lipase: 38 U/L (ref 11–51)

## 2021-06-23 MED ORDER — CEPHALEXIN 500 MG PO CAPS: 500.0000 mg | ORAL_CAPSULE | Freq: Four times a day (QID) | ORAL | 0 refills | Status: DC

## 2021-06-23 MED ORDER — ALBUTEROL SULFATE HFA 108 (90 BASE) MCG/ACT IN AERS
2.0000 | INHALATION_SPRAY | Freq: Once | RESPIRATORY_TRACT | Status: AC
  Administered 2021-06-23: 2 via RESPIRATORY_TRACT
  Filled 2021-06-23: qty 6.7

## 2021-06-23 MED ORDER — CEPHALEXIN 250 MG PO CAPS
500.0000 mg | ORAL_CAPSULE | Freq: Once | ORAL | Status: AC
  Administered 2021-06-23: 500 mg via ORAL
  Filled 2021-06-23: qty 2

## 2021-06-23 NOTE — ED Triage Notes (Addendum)
Patient here for continued cough and body aches after testing positive for COVID on 06/15/2021. Patient also reports dysuria and lower abdominal pain when coughing. Patient alert, oriented, and ambulatory, and in no apparent distress at this time.

## 2021-06-23 NOTE — Discharge Instructions (Signed)
Use your inhaler 2 puffs 4 times a day as needed for shortness of breath and cough.  Follow-up with your doctor the end of the week for recheck on urinary tract infection.  Return if any problem

## 2021-06-23 NOTE — ED Provider Notes (Signed)
[facility] EMERGENCY DEPARTMENT Provider Note   CSN: [id number] Arrival date & time: 06/23/21  6579     History Chief Complaint  Patient presents with   Cough    Bailey [name] is a 65 y.o. female.  Patient complains of a persistent cough.  He was diagnosed with COVID the end of September.  Patient also complains of dysuria.  The history is provided by the patient and medical records. No language interpreter was used.  Cough Cough characteristics:  Non-productive Sputum characteristics:  Nondescript Severity:  Moderate Onset quality:  Sudden Timing:  Constant Progression:  Waxing and waning Chronicity:  New Smoker: no   Context: not animal exposure   Associated symptoms: no chest pain, no eye discharge, no headaches and no rash       Past Medical History:  Diagnosis Date   Arthritis    Cataract    COPD (chronic obstructive pulmonary disease) ([place])    Diabetes mellitus without complication ([place])    Fibrocystic breast disease July 2016   GERD (gastroesophageal reflux disease)    Hyperlipidemia    Hypertension     Patient Active Problem List   Diagnosis Date Noted   Delta beta thalassemia ([place]) 11/22/2019   Diabetic retinopathy ([place]) 02/20/2017   Fatty liver 11/24/2016   GERD (gastroesophageal reflux disease) 07/26/2015   Hyperlipidemia 07/26/2015   Dyspepsia 07/26/2015   Essential hypertension 07/26/2015   Fibrocystic breast disease 03/22/2015   Type 2 diabetes mellitus treated without insulin ([place]) 02/16/2013    Past Surgical History:  Procedure Laterality Date   BREAST BIOPSY Right 04/19/2015   benign   CATARACT EXTRACTION     CHOLECYSTECTOMY     COLONOSCOPY     GALLBLADDER SURGERY     UTERINE FIBROID SURGERY       OB History     Gravida  2   Para  2   Term  2   Preterm      AB      Living  2      SAB      IAB      Ectopic      Multiple      Live Births  2           Family History  Problem  Relation Age of Onset   Diabetes Mother    Stroke Mother    Dementia Mother    Heart disease Father    Diabetes Father    Diabetes Brother    Dementia Brother    Renal Disease Brother    Diabetes Sister    Narcolepsy Sister    Arthritis Sister    Diabetes Sister    Obesity Sister    Diabetes Brother    Heart disease Brother    Cancer Brother        pancreatic   Diabetes Brother    Diabetes Brother    Stomach cancer Sister    Colon cancer Neg Hx    Esophageal cancer Neg Hx    Rectal cancer Neg Hx     Social History   Tobacco Use   Smoking status: Every Day    Packs/day: 1.00    Years: 34.00    Pack years: 34.00    Types: Cigarettes   Smokeless tobacco: Never   Tobacco comments:    Tobacco info given 10/11/15  Vaping Use   Vaping Use: Never used  Substance Use Topics   Alcohol use: Not Currently  Drug use: No    Home Medications Prior to Admission medications   Medication Sig Start Date End Date Taking? Authorizing Provider  cephALEXin (KEFLEX) 500 MG capsule Take 1 capsule (500 mg total) by mouth 4 (four) times daily. 06/23/21  Yes Milton Ferguson, MD  Accu-Chek Softclix Lancets lancets Use to check blood sugar once a day. Dx code: E11.9 10/24/20   Copland, Gay Filler, MD  acetaminophen (TYLENOL) 500 MG tablet Take 1,000 mg by mouth every 6 (six) hours as needed (pain). Reported on 04/08/2016    [provider]  atorvastatin (LIPITOR) 40 MG tablet TAKE 1 TABLET EVERY DAY 03/28/21   Copland, Gay Filler, MD  blood glucose meter kit and supplies KIT Dispense based on patient and insurance preference. Use up to four times daily as directed. (FOR ICD-9 250.00, 250.01). 09/26/15   Jaynee Eagles, PA-C  Blood Glucose Monitoring Suppl (ACCU-CHEK GUIDE ME) w/Device KIT Use to check blood sugar once a day. Dx code: E11.9 10/24/20   Copland, Gay Filler, MD  cholestyramine (QUESTRAN) 4 g packet DISSOLVE & TAKE 1 POWDER PACKET BY MOUTH TWICE DAILY 02/19/21   Copland, Gay Filler, MD   cyclobenzaprine (FLEXERIL) 5 MG tablet TAKE ONE TABLET BY MOUTH AT BEDTIME AS NEEDED FOR NECK PAIN Patient taking differently: as needed. 12/07/19   Copland, Gay Filler, MD  dicyclomine (BENTYL) 20 MG tablet Take 1 tablet (20 mg total) by mouth every 4 (four) hours. 06/04/21   Levin Erp, PA  glimepiride (AMARYL) 4 MG tablet TAKE 2 TABLETS EVERY DAY WITH BREAKFAST 03/28/21   Copland, Gay Filler, MD  glucose blood (ACCU-CHEK GUIDE) test strip USE ONE TIME DAILY TO TEST BLOOD SUGAR 03/28/21   Copland, Gay Filler, MD  hydrochlorothiazide (MICROZIDE) 12.5 MG capsule Take 1 capsule (12.5 mg total) by mouth daily. 06/17/20   Copland, Gay Filler, MD  INVOKANA 100 MG TABS tablet TAKE 1 TABLET BY MOUTH ONCE DAILY BEFORE BREAKFAST 04/17/21   Mosie Lukes, MD  lisinopril (ZESTRIL) 20 MG tablet Take 1 tablet (20 mg total) by mouth daily. 09/12/20   Copland, Gay Filler, MD  metFORMIN (GLUCOPHAGE) 1000 MG tablet TAKE 1 TABLET BY MOUTH TWICE DAILY WITH MEALS. APPOINTMENT REQUIRED FOR FUTURE REFILLS. 04/17/21   Mosie Lukes, MD  pantoprazole (PROTONIX) 40 MG tablet TAKE 1 TABLET TWICE DAILY 03/28/21   Copland, Gay Filler, MD  sucralfate (CARAFATE) 1 GM/10ML suspension Take 10 mLs (1 g total) by mouth 4 (four) times daily -  with meals and at bedtime. 09/12/20   Copland, Gay Filler, MD  sulfamethoxazole-trimethoprim (BACTRIM DS) 800-160 MG tablet Take 1 tablet by mouth 2 (two) times daily. 04/30/21   Shelly Bombard, MD  triamcinolone cream (KENALOG) 0.1 % Apply 1 application topically 2 (two) times daily. Use as needed on dry or scaly areas 03/30/19   Copland, Gay Filler, MD    Allergies    Patient has no known allergies.  Review of Systems   Review of Systems  Constitutional:  Negative for appetite change and fatigue.  HENT:  Negative for congestion, ear discharge and sinus pressure.   Eyes:  Negative for discharge.  Respiratory:  Positive for cough.   Cardiovascular:  Negative for chest pain.   Gastrointestinal:  Negative for abdominal pain and diarrhea.  Genitourinary:  Positive for dysuria. Negative for frequency and hematuria.  Musculoskeletal:  Negative for back pain.  Skin:  Negative for rash.  Neurological:  Negative for seizures and headaches.  Psychiatric/Behavioral:  Negative  for hallucinations.    Physical Exam Updated Vital Signs BP (!) 175/54   Pulse 94   Temp 97.8 F (36.6 C) (Oral)   Resp 16   SpO2 100%   Physical Exam Vitals and nursing note reviewed.  Constitutional:      Appearance: Normal appearance. She is well-developed.  HENT:     Head: Normocephalic.     Nose: Nose normal.  Eyes:     General: No scleral icterus.    Conjunctiva/sclera: Conjunctivae normal.  Neck:     Thyroid: No thyromegaly.  Cardiovascular:     Rate and Rhythm: Normal rate and regular rhythm.     Heart sounds: No murmur heard.   No friction rub. No gallop.  Pulmonary:     Breath sounds: No stridor. No wheezing or rales.  Chest:     Chest wall: No tenderness.  Abdominal:     General: There is no distension.     Tenderness: There is no abdominal tenderness. There is no rebound.  Musculoskeletal:        General: Normal range of motion.     Cervical back: Neck supple.  Lymphadenopathy:     Cervical: No cervical adenopathy.  Skin:    Findings: No erythema or rash.  Neurological:     Mental Status: She is alert and oriented to person, place, and time.     Motor: No abnormal muscle tone.     Coordination: Coordination normal.  Psychiatric:        Behavior: Behavior normal.    ED Results / Procedures / Treatments   Labs (all labs ordered are listed, but only abnormal results are displayed) Labs Reviewed  COMPREHENSIVE METABOLIC PANEL - Abnormal; Notable for the following components:      Result Value   CO2 20 (*)    Glucose, Bld 179 (*)    Alkaline Phosphatase 132 (*)    All other components within normal limits  CBC - Abnormal; Notable for the following  components:   RBC 5.99 (*)    MCV 73.1 (*)    MCH 22.5 (*)    RDW 17.5 (*)    All other components within normal limits  URINALYSIS, ROUTINE W REFLEX MICROSCOPIC - Abnormal; Notable for the following components:   APPearance CLOUDY (*)    Glucose, UA >=500 (*)    Hgb urine dipstick LARGE (*)    Protein, ur 100 (*)    Leukocytes,Ua LARGE (*)    RBC / HPF >50 (*)    WBC, UA >50 (*)    All other components within normal limits  URINE CULTURE  LIPASE, BLOOD    EKG None  Radiology DG Chest Port 1 View  Result Date: 06/23/2021 CLINICAL DATA:  Continued cough and body aches after testing positive for COVID-19 on 06/15/2021, dysuria, lower abdominal pain with coughing EXAM: PORTABLE CHEST 1 VIEW COMPARISON:  Portable exam 1035 hours compared to 08/03/2018 FINDINGS: Upper normal heart size. Mediastinal contours and pulmonary vascularity normal. Atherosclerotic calcification aorta. Minimal chronic peribronchial thickening. No pulmonary infiltrate, pleural effusion, or pneumothorax. No acute osseous findings. IMPRESSION: Minimal chronic bronchitic changes without acute infiltrate. Aortic Atherosclerosis (ICD10-I70.0). Electronically Signed   By: Lavonia Dana M.D.   On: 06/23/2021 11:10    Procedures Procedures   Medications Ordered in ED Medications  albuterol (VENTOLIN HFA) 108 (90 Base) MCG/ACT inhaler 2 puff (2 puffs Inhalation Given 06/23/21 1207)  cephALEXin (KEFLEX) capsule 500 mg (500 mg Oral Given 06/23/21 1207)  ED Course  I have reviewed the triage vital signs and the nursing notes.  Pertinent labs & imaging results that were available during my care of the patient were reviewed by me and considered in my medical decision making (see chart for details).    MDM Rules/Calculators/A&P                           Patient with COVID.  He is given an inhaler to help with her cough and shortness of breath.  She also has urinary tract infection and she is given Keflex for Final  Clinical Impression(s) / ED Diagnoses Final diagnoses:  Acute cough  COVID-19  Acute cystitis without hematuria    Rx / DC Orders ED Discharge Orders          Ordered    cephALEXin (KEFLEX) 500 MG capsule  4 times daily        06/23/21 1339             Milton Ferguson, MD 06/28/21 1114

## 2021-06-25 LAB — URINE CULTURE: Culture: >=100000 — AB

## 2021-06-26 ENCOUNTER — Telehealth: Payer: Self-pay | Admitting: *Deleted

## 2021-06-26 NOTE — Telephone Encounter (Signed)
Post ED Visit - Positive Culture Follow-up  Culture report reviewed by antimicrobial stewardship pharmacist: [place] Team []  Elenor Quinones, Pharm.D. []  Heide Guile, Pharm.D., BCPS AQ-ID []  Parks Neptune, Pharm.D., BCPS []  Alycia Rossetti, Pharm.D., BCPS []  [place], [place].D., BCPS, AAHIVP []  Legrand Como, Pharm.D., BCPS, AAHIVP []  Salome Arnt, PharmD, BCPS []  Johnnette Gourd, PharmD, BCPS []  Hughes Better, PharmD, BCPS []  Leeroy Cha, PharmD []  Laqueta Linden, PharmD, BCPS []  Albertina Parr, PharmD  [place] Team []  Leodis Sias, PharmD []  Lindell Spar, PharmD []  Royetta Asal, PharmD []  Graylin Shiver, Rph []  Rema Fendt) Glennon Mac, PharmD []  Arlyn Dunning, PharmD []  Netta Cedars, PharmD []  Dia Sitter, PharmD []  Leone Haven, PharmD []  Gretta Arab, PharmD []  Theodis Shove, PharmD []  Peggyann Juba, PharmD []  Reuel Boom, PharmD   Positive urine culture Treated with Cephalexin, organism sensitive to the same and no further patient follow-up is required at this time.  Janeece Fitting, PA-C  [place] 06/26/2021, 8:31 AM

## 2021-06-26 NOTE — Progress Notes (Signed)
ED Antimicrobial Stewardship Positive Culture Follow Up   Bailey [name] is an 64 y.o. female who presented to [facility] on 06/23/2021 with a chief complaint of  Chief Complaint  Patient presents with   Cough    Recent Results (from the past 720 hour(s))  Urine Culture     Status: Abnormal   Collection Time: 06/23/21 12:08 PM   Specimen: Urine, Clean Catch  Result Value Ref Range Status   Specimen Description URINE, CLEAN CATCH  Final   Special Requests   Final    NONE Performed at [facility], [address] [place], [place] [address]    Culture >=100,000 COLONIES/mL STAPHYLOCOCCUS LUGDUNENSIS (A)  Final   Report Status 06/25/2021 FINAL  Final   Organism ID, Bacteria STAPHYLOCOCCUS LUGDUNENSIS (A)  Final      Susceptibility   Staphylococcus lugdunensis - MIC*    CIPROFLOXACIN <=0.5 SENSITIVE Sensitive     GENTAMICIN <=0.5 SENSITIVE Sensitive     NITROFURANTOIN <=16 SENSITIVE Sensitive     OXACILLIN RESISTANT Resistant     TETRACYCLINE 2 SENSITIVE Sensitive     VANCOMYCIN 1 SENSITIVE Sensitive     TRIMETH/SULFA <=10 SENSITIVE Sensitive     CLINDAMYCIN RESISTANT Resistant     RIFAMPIN <=0.5 SENSITIVE Sensitive     Inducible Clindamycin POSITIVE Resistant     * >=100,000 COLONIES/mL STAPHYLOCOCCUS LUGDUNENSIS   [x]  Treated with Keflex, organism resistant to prescribed antimicrobial []  Patient discharged originally without antimicrobial agent and treatment is now indicated  New antibiotic prescription: Follow up with patient and assess for any ongoing UTI symptoms. If no symptoms persist, then no changes.   If UTI symptoms are persisting since discharge, then discontinue Keflex and prescribe MacroBID 100mg  PO BID x 5 days.  ED Provider: Janeece Fitting, PA-C  Joetta Manners, PharmD, [facility] Emergency Medicine Clinical Pharmacist ED RPh Phone: [place]: [phone]

## 2021-06-29 NOTE — Progress Notes (Addendum)
[facility] [address] [place], [place] [address] [phone]  Date:  07/02/2021   Name:  Bailey [name]   DOB:  August 08, 1956   MRN:  [id number]  PCP:  Darreld Mclean, MD    Chief Complaint: Follow-up (Concerns/ questions: /1. ER: 06/23/21: Albuterol- cough, Dysuria- Kefliex 500 QID cough still lingering. 2. Neck pain. /Flu shot today: yes/)   History of Present Illness:  Bailey [name] is a 65 y.o. very pleasant female patient who presents with the following:  Patient seen today for follow-up History of hyperlipidemia, diabetes with retinopathy, hypertension, GERD, beta thalassemia  Most recent visit with myself was on June 1 A1c at that time was 7.8; she reduced her invokana to 50 mg due to SE and is tolerating this well, we will see how her A1c has responded   She also has elevated alkaline phosphatase, but the rest of her work-up has been negative.  Likely this is a benign finding  She also had COVID last month, symptoms started on September 25  She was in the ER on October 3 with cough likely secondary to recent COVID-19 CMP, CBC done in the ER She notes that her cough is better but still not 100% resolved  She is back at work   Shingles vaccine- recommended  Bone density scan is scheduled Eye exam- she will set this up, [facility]  Flu shot- give today Foot exam- today  COVID booster- recommend next month   She does have sx of neuropathy with tingling and numbness of her feet No ulceration or skin breakdown She is careful to check her feet daily and wear shoes She is not having pain   Lipitor Questran Amaryl 4 Lisinopril 20 mg Metformin Patient Active Problem List   Diagnosis Date Noted   Type 2 diabetes mellitus with diabetic neuropathy, unspecified ([place]) 07/02/2021   Delta beta thalassemia ([place]) 11/22/2019   Diabetic retinopathy ([place]) 02/20/2017   Fatty liver 11/24/2016   GERD  (gastroesophageal reflux disease) 07/26/2015   Hyperlipidemia 07/26/2015   Dyspepsia 07/26/2015   Essential hypertension 07/26/2015   Fibrocystic breast disease 03/22/2015   Type 2 diabetes mellitus treated without insulin ([place]) 02/16/2013    Past Medical History:  Diagnosis Date   Arthritis    Cataract    COPD (chronic obstructive pulmonary disease) ([place])    Diabetes mellitus without complication ([place])    Fibrocystic breast disease July 2016   GERD (gastroesophageal reflux disease)    Hyperlipidemia    Hypertension     Past Surgical History:  Procedure Laterality Date   BREAST BIOPSY Right 04/19/2015   benign   CATARACT EXTRACTION     CHOLECYSTECTOMY     COLONOSCOPY     GALLBLADDER SURGERY     UTERINE FIBROID SURGERY      Social History   Tobacco Use   Smoking status: Every Day    Packs/day: 1.00    Years: 34.00    Pack years: 34.00    Types: Cigarettes   Smokeless tobacco: Never   Tobacco comments:    Tobacco info given 10/11/15  Vaping Use   Vaping Use: Never used  Substance Use Topics   Alcohol use: Not Currently   Drug use: No    Family History  Problem Relation Age of Onset   Diabetes Mother    Stroke Mother    Dementia Mother    Heart disease Father  Diabetes Father    Diabetes Brother    Dementia Brother    Renal Disease Brother    Diabetes Sister    Narcolepsy Sister    Arthritis Sister    Diabetes Sister    Obesity Sister    Diabetes Brother    Heart disease Brother    Cancer Brother        pancreatic   Diabetes Brother    Diabetes Brother    Stomach cancer Sister    Colon cancer Neg Hx    Esophageal cancer Neg Hx    Rectal cancer Neg Hx     No Known Allergies  Medication list has been reviewed and updated.  Current Outpatient Medications on File Prior to Visit  Medication Sig Dispense Refill   Accu-Chek Softclix Lancets lancets Use to check blood sugar once a day. Dx code: E11.9 100 each 1   acetaminophen (TYLENOL) 500 MG  tablet Take 1,000 mg by mouth every 6 (six) hours as needed (pain). Reported on 04/08/2016     atorvastatin (LIPITOR) 40 MG tablet TAKE 1 TABLET EVERY DAY 90 tablet 1   blood glucose meter kit and supplies KIT Dispense based on patient and insurance preference. Use up to four times daily as directed. (FOR ICD-9 250.00, 250.01). 1 each 0   Blood Glucose Monitoring Suppl (ACCU-CHEK GUIDE ME) w/Device KIT Use to check blood sugar once a day. Dx code: E11.9 1 kit 0   cholestyramine (QUESTRAN) 4 g packet DISSOLVE & TAKE 1 POWDER PACKET BY MOUTH TWICE DAILY 180 packet 3   dicyclomine (BENTYL) 20 MG tablet Take 1 tablet (20 mg total) by mouth every 4 (four) hours. 120 tablet 3   glimepiride (AMARYL) 4 MG tablet TAKE 2 TABLETS EVERY DAY WITH BREAKFAST 180 tablet 1   glucose blood (ACCU-CHEK GUIDE) test strip USE ONE TIME DAILY TO TEST BLOOD SUGAR 100 strip 1   INVOKANA 100 MG TABS tablet TAKE 1 TABLET BY MOUTH ONCE DAILY BEFORE BREAKFAST 90 tablet 1   lisinopril (ZESTRIL) 20 MG tablet Take 1 tablet (20 mg total) by mouth daily. 90 tablet 3   metFORMIN (GLUCOPHAGE) 1000 MG tablet TAKE 1 TABLET BY MOUTH TWICE DAILY WITH MEALS. APPOINTMENT REQUIRED FOR FUTURE REFILLS. 180 tablet 1   pantoprazole (PROTONIX) 40 MG tablet TAKE 1 TABLET TWICE DAILY 180 tablet 1   sucralfate (CARAFATE) 1 GM/10ML suspension Take 10 mLs (1 g total) by mouth 4 (four) times daily -  with meals and at bedtime. 420 mL 3   triamcinolone cream (KENALOG) 0.1 % Apply 1 application topically 2 (two) times daily. Use as needed on dry or scaly areas 60 g 1   No current facility-administered medications on file prior to visit.    Review of Systems:  As per HPI- otherwise negative.   Physical Examination: Vitals:   07/02/21 0909  BP: 140/78  Pulse: 71  Resp: 18  Temp: 97.9 F (36.6 C)  SpO2: 99%   Vitals:   07/02/21 0909  Weight: 193 lb 9.6 oz (87.8 kg)  Height: 5' 5"  (1.651 m)   Body mass index is 32.22 kg/m. Ideal Body  Weight: Weight in (lb) to have BMI = 25: 149.9  GEN: no acute distress.  Overweight, looks well  HEENT: Atraumatic, Normocephalic.  Ears and Nose: No external deformity. CV: RRR, No M/G/R. No JVD. No thrill. No extra heart sounds. PULM: CTA B, no wheezes, crackles, rhonchi. No retractions. No resp. distress. No accessory muscle use. EXTR: No  c/c/e PSYCH: Normally interactive. Conversant.  Foot exam:  see QM Some neuropathy   Assessment and Plan: Controlled type 2 diabetes mellitus without complication, without long-term current use of insulin ([place]) - Plan: Hemoglobin A1c  Essential hypertension  Hyperlipidemia, unspecified hyperlipidemia type - Plan: Lipid panel  Need for influenza vaccination - Plan: Flu Vaccine QUAD High Dose(Fluad)  Neck pain - Plan: cyclobenzaprine (FLEXERIL) 5 MG tablet  Frequent UTI - Plan: Urine Culture  Type 2 diabetes mellitus with diabetic neuropathy, without long-term current use of insulin ([facility])  Recheck Urine cx from recent UTI (treated in ER) per her request  Flu vaccine given, recommended covid booster Refilled flexeril that she uses on occasion Will plan further follow- up pending labs.    Signed Lamar Blinks, MD  Received her labs as below, message to patient Results for orders placed or performed in visit on 07/02/21  Urine Culture   Specimen: Blood  Result Value Ref Range   MICRO NUMBER: [id number]    SPECIMEN QUALITY: Adequate    Sample Source NOT GIVEN    STATUS: FINAL    ISOLATE 1:      Less than 10,000 CFU/mL of single Gram negative organism isolated. No further testing will be performed. If clinically indicated, recollection using a method to minimize contamination, with prompt transfer to Urine Culture Transport Tube, is recommended.  Lipid panel  Result Value Ref Range   Cholesterol 174 0 - 200 mg/dL   Triglycerides 101.0 0.0 - 149.0 mg/dL   HDL 57.90 >39.00 mg/dL   VLDL 20.2 0.0 - 40.0 mg/dL   LDL Cholesterol 96 0 -  99 mg/dL   Total CHOL/HDL Ratio 3    NonHDL 116.52    Received urine culture, 10/14-message to patient

## 2021-06-29 NOTE — Patient Instructions (Addendum)
It was good to see you again today, I will be in touch with your labs as soon as possible  Assuming your A1c is ok we can visit in 6 months Please plan to get the new covid booster at your convenience, as well as the shingles vaccine if not done already (will be done at your pharmacy as you have medicare)  Take care!

## 2021-07-02 ENCOUNTER — Other Ambulatory Visit: Payer: Self-pay

## 2021-07-02 ENCOUNTER — Ambulatory Visit (INDEPENDENT_AMBULATORY_CARE_PROVIDER_SITE_OTHER): Payer: Medicare HMO | Admitting: Family Medicine

## 2021-07-02 ENCOUNTER — Encounter: Payer: Self-pay | Admitting: Family Medicine

## 2021-07-02 ENCOUNTER — Other Ambulatory Visit: Payer: Medicare HMO

## 2021-07-02 VITALS — BP 140/78 | HR 71 | Temp 97.9°F | Resp 18 | Ht 65.0 in | Wt 193.6 lb

## 2021-07-02 DIAGNOSIS — E785 Hyperlipidemia, unspecified: Secondary | ICD-10-CM | POA: Diagnosis not present

## 2021-07-02 DIAGNOSIS — E1140 Type 2 diabetes mellitus with diabetic neuropathy, unspecified: Secondary | ICD-10-CM | POA: Diagnosis not present

## 2021-07-02 DIAGNOSIS — I10 Essential (primary) hypertension: Secondary | ICD-10-CM

## 2021-07-02 DIAGNOSIS — N390 Urinary tract infection, site not specified: Secondary | ICD-10-CM | POA: Diagnosis not present

## 2021-07-02 DIAGNOSIS — E119 Type 2 diabetes mellitus without complications: Secondary | ICD-10-CM

## 2021-07-02 DIAGNOSIS — Z23 Encounter for immunization: Secondary | ICD-10-CM | POA: Diagnosis not present

## 2021-07-02 DIAGNOSIS — M542 Cervicalgia: Secondary | ICD-10-CM | POA: Diagnosis not present

## 2021-07-02 LAB — LIPID PANEL
Cholesterol: 174 mg/dL (ref 0–200)
HDL: 57.9 mg/dL (ref >39.00)
LDL Cholesterol: 96 mg/dL (ref 0–99)
NonHDL: 116.52
Total CHOL/HDL Ratio: 3
Triglycerides: 101 mg/dL (ref 0.0–149.0)
VLDL: 20.2 mg/dL (ref 0.0–40.0)

## 2021-07-02 MED ORDER — CYCLOBENZAPRINE HCL 5 MG PO TABS: 5.0000 mg | ORAL_TABLET | Freq: Every day | ORAL | 1 refills | Status: DC

## 2021-07-03 LAB — HEMOGLOBIN A1C
Hgb A1c MFr Bld: 6.7 % of total Hgb — ABNORMAL HIGH (ref <5.7)
Mean Plasma Glucose: 146 mg/dL
eAG (mmol/L): 8.1 mmol/L

## 2021-07-03 LAB — URINE CULTURE
MICRO NUMBER:: 12493500
SPECIMEN QUALITY:: ADEQUATE

## 2021-08-15 DIAGNOSIS — Z1152 Encounter for screening for COVID-19: Secondary | ICD-10-CM | POA: Diagnosis not present

## 2021-08-15 DIAGNOSIS — Z9189 Other specified personal risk factors, not elsewhere classified: Secondary | ICD-10-CM | POA: Diagnosis not present

## 2021-08-18 ENCOUNTER — Encounter: Payer: Self-pay | Admitting: Family Medicine

## 2021-08-19 NOTE — Progress Notes (Signed)
[facility] [address], [place], [place] [address] [phone]  Date:  08/21/2021   Name:  Bailey [name]   DOB:  1955/10/07   MRN:  [id number]  PCP:  Darreld Mclean, MD    Chief Complaint: Cough (Productive Cough )   History of Present Illness:  Bailey [name] is a 65 y.o. very pleasant female patient who presents with the following:  Patient seen today with concern of cough- History of hyperlipidemia, diabetes with retinopathy, hypertension, GERD, beta thalassemia   Most recent visit with myself was in October for routine follow-up She did have COVID-19 recently, symptoms began September 25.  She was seen in the ER on October 3 due to cough She contacted me on November 28 with concern of persistent cough so we made this appointment She reports she was seen at [facility] on November 25 and was tested for COVID/flu/strep, all negative She was not given any medication- just had the testing done   Lab Results  Component Value Date   HGBA1C 6.7 (H) 07/02/2021   1 view chest film taken October 3 IMPRESSION: Minimal chronic bronchitic changes without acute infiltrate. Aortic Atherosclerosis (ICD10-I70.0).  At this time she notes that she continues to cough and may bring up some mucus-however, she is otherwise feeling fairly well No fever or chills She may wheeze at times She may blow some mucus from her left nostril  She has some pain in her shoulders from coughing No ST, no GI symptoms   Patient Active Problem List   Diagnosis Date Noted   Type 2 diabetes mellitus with diabetic neuropathy, unspecified ([place]) 07/02/2021   Delta beta thalassemia ([place]) 11/22/2019   Diabetic retinopathy ([place]) 02/20/2017   Fatty liver 11/24/2016   GERD (gastroesophageal reflux disease) 07/26/2015   Hyperlipidemia 07/26/2015   Dyspepsia 07/26/2015   Essential hypertension 07/26/2015   Fibrocystic breast disease  03/22/2015   Type 2 diabetes mellitus treated without insulin ([place]) 02/16/2013    Past Medical History:  Diagnosis Date   Arthritis    Cataract    COPD (chronic obstructive pulmonary disease) ([place])    Diabetes mellitus without complication ([place])    Fibrocystic breast disease July 2016   GERD (gastroesophageal reflux disease)    Hyperlipidemia    Hypertension     Past Surgical History:  Procedure Laterality Date   BREAST BIOPSY Right 04/19/2015   benign   CATARACT EXTRACTION     CHOLECYSTECTOMY     COLONOSCOPY     GALLBLADDER SURGERY     UTERINE FIBROID SURGERY      Social History   Tobacco Use   Smoking status: Every Day    Packs/day: 1.00    Years: 34.00    Pack years: 34.00    Types: Cigarettes   Smokeless tobacco: Never   Tobacco comments:    Tobacco info given 10/11/15  Vaping Use   Vaping Use: Never used  Substance Use Topics   Alcohol use: Not Currently   Drug use: No    Family History  Problem Relation Age of Onset   Diabetes Mother    Stroke Mother    Dementia Mother    Heart disease Father    Diabetes Father    Diabetes Brother    Dementia Brother    Renal Disease Brother    Diabetes Sister    Narcolepsy Sister    Arthritis Sister    Diabetes  Sister    Obesity Sister    Diabetes Brother    Heart disease Brother    Cancer Brother        pancreatic   Diabetes Brother    Diabetes Brother    Stomach cancer Sister    Colon cancer Neg Hx    Esophageal cancer Neg Hx    Rectal cancer Neg Hx     No Known Allergies  Medication list has been reviewed and updated.  Current Outpatient Medications on File Prior to Visit  Medication Sig Dispense Refill   Accu-Chek Softclix Lancets lancets Use to check blood sugar once a day. Dx code: E11.9 100 each 1   acetaminophen (TYLENOL) 500 MG tablet Take 1,000 mg by mouth every 6 (six) hours as needed (pain). Reported on 04/08/2016     atorvastatin (LIPITOR) 40 MG tablet TAKE 1 TABLET EVERY DAY 90 tablet  1   blood glucose meter kit and supplies KIT Dispense based on patient and insurance preference. Use up to four times daily as directed. (FOR ICD-9 250.00, 250.01). 1 each 0   Blood Glucose Monitoring Suppl (ACCU-CHEK GUIDE ME) w/Device KIT Use to check blood sugar once a day. Dx code: E11.9 1 kit 0   cholestyramine (QUESTRAN) 4 g packet DISSOLVE & TAKE 1 POWDER PACKET BY MOUTH TWICE DAILY 180 packet 3   cyclobenzaprine (FLEXERIL) 5 MG tablet Take 1 tablet (5 mg total) by mouth at bedtime. Use as needed for neck pain 30 tablet 1   dicyclomine (BENTYL) 20 MG tablet Take 1 tablet (20 mg total) by mouth every 4 (four) hours. 120 tablet 3   glimepiride (AMARYL) 4 MG tablet TAKE 2 TABLETS EVERY DAY WITH BREAKFAST 180 tablet 1   glucose blood (ACCU-CHEK GUIDE) test strip USE ONE TIME DAILY TO TEST BLOOD SUGAR 100 strip 1   INVOKANA 100 MG TABS tablet TAKE 1 TABLET BY MOUTH ONCE DAILY BEFORE BREAKFAST 90 tablet 1   lisinopril (ZESTRIL) 20 MG tablet Take 1 tablet (20 mg total) by mouth daily. 90 tablet 3   metFORMIN (GLUCOPHAGE) 1000 MG tablet TAKE 1 TABLET BY MOUTH TWICE DAILY WITH MEALS. APPOINTMENT REQUIRED FOR FUTURE REFILLS. 180 tablet 1   pantoprazole (PROTONIX) 40 MG tablet TAKE 1 TABLET TWICE DAILY 180 tablet 1   sucralfate (CARAFATE) 1 GM/10ML suspension Take 10 mLs (1 g total) by mouth 4 (four) times daily -  with meals and at bedtime. 420 mL 3   triamcinolone cream (KENALOG) 0.1 % Apply 1 application topically 2 (two) times daily. Use as needed on dry or scaly areas 60 g 1   No current facility-administered medications on file prior to visit.    Review of Systems:  As per HPI- otherwise negative.   Physical Examination: Vitals:   08/21/21 1446  BP: 130/60  Pulse: 94  Resp: 16  Temp: 98 F (36.7 C)  SpO2: 98%   Vitals:   08/21/21 1446  Weight: 193 lb (87.5 kg)  Height: _0  (1.651 m)   Body mass index is 32.12 kg/m. Ideal Body Weight: Weight in (lb) to have BMI = 25:  149.9  GEN: no acute distress.  Mildly obese, looks well HEENT: Atraumatic, Normocephalic.  Bilateral TM wnl, oropharynx normal.  PEERL,EOMI.   Ears and Nose: No external deformity. CV: RRR, No M/G/R. No JVD. No thrill. No extra heart sounds. PULM: CTA B, no wheezes, crackles, rhonchi. No retractions. No resp. distress. No accessory muscle use. ABD: S, NT, ND EXTR: No  c/c/e PSYCH: Normally interactive. Conversant.    Assessment and Plan: Wheezing - Plan: albuterol (VENTOLIN HFA) 108 (90 Base) MCG/ACT inhaler  Subacute cough - Plan: predniSONE (DELTASONE) 20 MG tablet  Patient seen today with persistent cough following recent illness.  She has been evaluated in urgent care, tested negative for COVID and flu.  Her symptoms are overall better but she continues to cough She has well-controlled diabetes.  We decided to try a short course of prednisone and also as needed albuterol. She agrees to contact me if not feeling better over the next few days She is advised blood pressure may be increased by steroid use, she will monitor this closely and be sure to take her medications  Signed Lamar Blinks, MD

## 2021-08-21 ENCOUNTER — Ambulatory Visit (INDEPENDENT_AMBULATORY_CARE_PROVIDER_SITE_OTHER): Payer: Medicare HMO | Admitting: Family Medicine

## 2021-08-21 VITALS — BP 130/60 | HR 94 | Temp 98.0°F | Resp 16 | Ht 65.0 in | Wt 193.0 lb

## 2021-08-21 DIAGNOSIS — R052 Subacute cough: Secondary | ICD-10-CM | POA: Diagnosis not present

## 2021-08-21 DIAGNOSIS — R062 Wheezing: Secondary | ICD-10-CM

## 2021-08-21 MED ORDER — ALBUTEROL SULFATE HFA 108 (90 BASE) MCG/ACT IN AERS
2.0000 | INHALATION_SPRAY | Freq: Four times a day (QID) | RESPIRATORY_TRACT | 3 refills | Status: DC | PRN
Start: 2021-08-21 — End: 2023-09-04

## 2021-08-21 MED ORDER — PREDNISONE 20 MG PO TABS: ORAL_TABLET | ORAL | 0 refills | Status: DC

## 2021-08-21 NOTE — Patient Instructions (Signed)
It was good to see you again today- use the prednisone for 5 days and the albuterol inhaler as needed  Please let me know if your cough is not improved in a week or so- Sooner if worse.

## 2021-09-11 DIAGNOSIS — H43813 Vitreous degeneration, bilateral: Secondary | ICD-10-CM | POA: Diagnosis not present

## 2021-09-11 DIAGNOSIS — H3582 Retinal ischemia: Secondary | ICD-10-CM | POA: Diagnosis not present

## 2021-09-11 DIAGNOSIS — H35033 Hypertensive retinopathy, bilateral: Secondary | ICD-10-CM | POA: Diagnosis not present

## 2021-09-11 DIAGNOSIS — E113393 Type 2 diabetes mellitus with moderate nonproliferative diabetic retinopathy without macular edema, bilateral: Secondary | ICD-10-CM | POA: Diagnosis not present

## 2021-09-11 DIAGNOSIS — H35373 Puckering of macula, bilateral: Secondary | ICD-10-CM | POA: Diagnosis not present

## 2021-09-25 ENCOUNTER — Ambulatory Visit (INDEPENDENT_AMBULATORY_CARE_PROVIDER_SITE_OTHER): Payer: Medicare HMO

## 2021-09-25 VITALS — Ht 65.0 in | Wt 193.0 lb

## 2021-09-25 DIAGNOSIS — Z0000 Encounter for general adult medical examination without abnormal findings: Secondary | ICD-10-CM

## 2021-09-25 NOTE — Patient Instructions (Signed)
Bailey [name] , Thank you for taking time to complete your Medicare Wellness Visit. I appreciate your ongoing commitment to your health goals. Please review the following plan we discussed and let me know if I can assist you in the future.   Screening recommendations/referrals: Colonoscopy: Completed 11/07/2015-Due 11/06/2025 Mammogram: Completed 10/29/2020-Due-10/29/2021 Bone Density: Scheduled for 10/09/2021 Recommended yearly ophthalmology/optometry visit for glaucoma screening and checkup Recommended yearly dental visit for hygiene and checkup  Vaccinations: Influenza vaccine: Up to date Pneumococcal vaccine: Up to date Tdap vaccine: Up to date Shingles vaccine: Discuss with pharmacy   Covid-19:Booster available at the pharmacy  Advanced directives: Information mailed today.  Conditions/risks identified: See problem list  Next appointment: Follow up in one year for your annual wellness visit 09/29/2022 @ 7:40 (phone visit)   Preventive Care 65 Years and Older, Female Preventive care refers to lifestyle choices and visits with your health care provider that can promote health and wellness. What does preventive care include? A yearly physical exam. This is also called an annual well check. Dental exams once or twice a year. Routine eye exams. Ask your health care provider how often you should have your eyes checked. Personal lifestyle choices, including: Daily care of your teeth and gums. Regular physical activity. Eating a healthy diet. Avoiding tobacco and drug use. Limiting alcohol use. Practicing safe sex. Taking low-dose aspirin every day. Taking vitamin and mineral supplements as recommended by your health care provider. What happens during an annual well check? The services and screenings done by your health care provider during your annual well check will depend on your age, overall health, lifestyle risk factors, and family history of disease. Counseling  Your health care  provider may ask you questions about your: Alcohol use. Tobacco use. Drug use. Emotional well-being. Home and relationship well-being. Sexual activity. Eating habits. History of falls. Memory and ability to understand (cognition). Work and work Statistician. Reproductive health. Screening  You may have the following tests or measurements: Height, weight, and BMI. Blood pressure. Lipid and cholesterol levels. These may be checked every 5 years, or more frequently if you are over 65 years old. Skin check. Lung cancer screening. You may have this screening every year starting at age 45 if you have a 30-pack-year history of smoking and currently smoke or have quit within the past 15 years. Fecal occult blood test (FOBT) of the stool. You may have this test every year starting at age 45. Flexible sigmoidoscopy or colonoscopy. You may have a sigmoidoscopy every 5 years or a colonoscopy every 10 years starting at age 58. Hepatitis C blood test. Hepatitis B blood test. Sexually transmitted disease (STD) testing. Diabetes screening. This is done by checking your blood sugar (glucose) after you have not eaten for a while (fasting). You may have this done every 1-3 years. Bone density scan. This is done to screen for osteoporosis. You may have this done starting at age 77. Mammogram. This may be done every 1-2 years. Talk to your health care provider about how often you should have regular mammograms. Talk with your health care provider about your test results, treatment options, and if necessary, the need for more tests. Vaccines  Your health care provider may recommend certain vaccines, such as: Influenza vaccine. This is recommended every year. Tetanus, diphtheria, and acellular pertussis (Tdap, Td) vaccine. You may need a Td booster every 10 years. Zoster vaccine. You may need this after age 65. Pneumococcal 13-valent conjugate (PCV13) vaccine. One dose is recommended after age  65. Pneumococcal polysaccharide (PPSV23) vaccine. One dose is recommended after age 16. Talk to your health care provider about which screenings and vaccines you need and how often you need them. This information is not intended to replace advice given to you by your health care provider. Make sure you discuss any questions you have with your health care provider. Document Released: 10/04/2015 Document Revised: 05/27/2016 Document Reviewed: 07/09/2015 Elsevier Interactive Patient Education  2017 [place] Prevention in the Home Falls can cause injuries. They can happen to people of all ages. There are many things you can do to make your home safe and to help prevent falls. What can I do on the outside of my home? Regularly fix the edges of walkways and driveways and fix any cracks. Remove anything that might make you trip as you walk through a door, such as a raised step or threshold. Trim any bushes or trees on the path to your home. Use bright outdoor lighting. Clear any walking paths of anything that might make someone trip, such as rocks or tools. Regularly check to see if handrails are loose or broken. Make sure that both sides of any steps have handrails. Any raised decks and porches should have guardrails on the edges. Have any leaves, snow, or ice cleared regularly. Use sand or salt on walking paths during winter. Clean up any spills in your garage right away. This includes oil or grease spills. What can I do in the bathroom? Use night lights. Install grab bars by the toilet and in the tub and shower. Do not use towel bars as grab bars. Use non-skid mats or decals in the tub or shower. If you need to sit down in the shower, use a plastic, non-slip stool. Keep the floor dry. Clean up any water that spills on the floor as soon as it happens. Remove soap buildup in the tub or shower regularly. Attach bath mats securely with double-sided non-slip rug tape. Do not have throw  rugs and other things on the floor that can make you trip. What can I do in the bedroom? Use night lights. Make sure that you have a light by your bed that is easy to reach. Do not use any sheets or blankets that are too big for your bed. They should not hang down onto the floor. Have a firm chair that has side arms. You can use this for support while you get dressed. Do not have throw rugs and other things on the floor that can make you trip. What can I do in the kitchen? Clean up any spills right away. Avoid walking on wet floors. Keep items that you use a lot in easy-to-reach places. If you need to reach something above you, use a strong step stool that has a grab bar. Keep electrical cords out of the way. Do not use floor polish or wax that makes floors slippery. If you must use wax, use non-skid floor wax. Do not have throw rugs and other things on the floor that can make you trip. What can I do with my stairs? Do not leave any items on the stairs. Make sure that there are handrails on both sides of the stairs and use them. Fix handrails that are broken or loose. Make sure that handrails are as long as the stairways. Check any carpeting to make sure that it is firmly attached to the stairs. Fix any carpet that is loose or worn. Avoid having throw rugs at the  top or bottom of the stairs. If you do have throw rugs, attach them to the floor with carpet tape. Make sure that you have a light switch at the top of the stairs and the bottom of the stairs. If you do not have them, ask someone to add them for you. What else can I do to help prevent falls? Wear shoes that: Do not have high heels. Have rubber bottoms. Are comfortable and fit you well. Are closed at the toe. Do not wear sandals. If you use a stepladder: Make sure that it is fully opened. Do not climb a closed stepladder. Make sure that both sides of the stepladder are locked into place. Ask someone to hold it for you, if  possible. Clearly mark and make sure that you can see: Any grab bars or handrails. First and last steps. Where the edge of each step is. Use tools that help you move around (mobility aids) if they are needed. These include: Canes. Walkers. Scooters. Crutches. Turn on the lights when you go into a dark area. Replace any light bulbs as soon as they burn out. Set up your furniture so you have a clear path. Avoid moving your furniture around. If any of your floors are uneven, fix them. If there are any pets around you, be aware of where they are. Review your medicines with your doctor. Some medicines can make you feel dizzy. This can increase your chance of falling. Ask your doctor what other things that you can do to help prevent falls. This information is not intended to replace advice given to you by your health care provider. Make sure you discuss any questions you have with your health care provider. Document Released: 07/04/2009 Document Revised: 02/13/2016 Document Reviewed: 10/12/2014 Elsevier Interactive Patient Education  2017 [employer].

## 2021-09-25 NOTE — Progress Notes (Signed)
Subjective:   Bailey [name] is a 66 y.o. female who presents for an Initial Medicare Annual Wellness Visit.  I connected with [name] today by telephone and verified that I am speaking with the correct person using two identifiers. Location patient: home Location provider: work Persons participating in the virtual visit: patient, Marine scientist.    I discussed the limitations, risks, security and privacy concerns of performing an evaluation and management service by telephone and the availability of in person appointments. I also discussed with the patient that there may be a patient responsible charge related to this service. The patient expressed understanding and verbally consented to this telephonic visit.    Interactive audio and video telecommunications were attempted between this provider and patient, however failed, due to patient having technical difficulties OR patient did not have access to video capability.  We continued and completed visit with audio only.  Some vital signs may be absent or patient reported.   Time Spent with patient on telephone encounter: 30 minutes   Review of Systems     Cardiac Risk Factors include: advanced age (>30mn, >>71women);diabetes mellitus;dyslipidemia;hypertension;obesity (BMI >30kg/m2);sedentary lifestyle;smoking/ tobacco exposure     Objective:    Today's Vitals   09/25/21 0741  Weight: 193 lb (87.5 kg)  Height: 5' 5"  (1.651 m)   Body mass index is 32.12 kg/m.  Advanced Directives 09/25/2021 11/07/2015 02/05/2015  Does Patient Have a Medical Advance Directive? No No No  Would patient like information on creating a medical advance directive? Yes (MAU/Ambulatory/Procedural Areas - Information given) - Yes - Educational materials given    Current Medications (verified) Outpatient Encounter Medications as of 09/25/2021  Medication Sig   Accu-Chek Softclix Lancets lancets Use to check blood sugar once a day. Dx code: E11.9   acetaminophen  (TYLENOL) 500 MG tablet Take 1,000 mg by mouth every 6 (six) hours as needed (pain). Reported on 04/08/2016   albuterol (VENTOLIN HFA) 108 (90 Base) MCG/ACT inhaler Inhale 2 puffs into the lungs every 6 (six) hours as needed for wheezing or shortness of breath.   atorvastatin (LIPITOR) 40 MG tablet TAKE 1 TABLET EVERY DAY   blood glucose meter kit and supplies KIT Dispense based on patient and insurance preference. Use up to four times daily as directed. (FOR ICD-9 250.00, 250.01).   Blood Glucose Monitoring Suppl (ACCU-CHEK GUIDE ME) w/Device KIT Use to check blood sugar once a day. Dx code: E11.9   cholestyramine (QUESTRAN) 4 g packet DISSOLVE & TAKE 1 POWDER PACKET BY MOUTH TWICE DAILY   cyclobenzaprine (FLEXERIL) 5 MG tablet Take 1 tablet (5 mg total) by mouth at bedtime. Use as needed for neck pain   dicyclomine (BENTYL) 20 MG tablet Take 1 tablet (20 mg total) by mouth every 4 (four) hours.   glimepiride (AMARYL) 4 MG tablet TAKE 2 TABLETS EVERY DAY WITH BREAKFAST   glucose blood (ACCU-CHEK GUIDE) test strip USE ONE TIME DAILY TO TEST BLOOD SUGAR   INVOKANA 100 MG TABS tablet TAKE 1 TABLET BY MOUTH ONCE DAILY BEFORE BREAKFAST   lisinopril (ZESTRIL) 20 MG tablet Take 1 tablet (20 mg total) by mouth daily.   metFORMIN (GLUCOPHAGE) 1000 MG tablet TAKE 1 TABLET BY MOUTH TWICE DAILY WITH MEALS. APPOINTMENT REQUIRED FOR FUTURE REFILLS.   pantoprazole (PROTONIX) 40 MG tablet TAKE 1 TABLET TWICE DAILY   predniSONE (DELTASONE) 20 MG tablet Take one tablet by mouth daily for 5 days   sucralfate (CARAFATE) 1 GM/10ML suspension Take 10 mLs (1 g  total) by mouth 4 (four) times daily -  with meals and at bedtime.   triamcinolone cream (KENALOG) 0.1 % Apply 1 application topically 2 (two) times daily. Use as needed on dry or scaly areas   No facility-administered encounter medications on file as of 09/25/2021.    Allergies (verified) Patient has no known allergies.   History: Past Medical History:   Diagnosis Date   Arthritis    Cataract    COPD (chronic obstructive pulmonary disease) ([place])    Diabetes mellitus without complication ([place])    Fibrocystic breast disease July 2016   GERD (gastroesophageal reflux disease)    Hyperlipidemia    Hypertension    Past Surgical History:  Procedure Laterality Date   BREAST BIOPSY Right 04/19/2015   benign   CATARACT EXTRACTION     CHOLECYSTECTOMY     COLONOSCOPY     GALLBLADDER SURGERY     UTERINE FIBROID SURGERY     Family History  Problem Relation Age of Onset   Diabetes Mother    Stroke Mother    Dementia Mother    Heart disease Father    Diabetes Father    Diabetes Brother    Dementia Brother    Renal Disease Brother    Diabetes Sister    Narcolepsy Sister    Arthritis Sister    Diabetes Sister    Obesity Sister    Diabetes Brother    Heart disease Brother    Cancer Brother        pancreatic   Diabetes Brother    Diabetes Brother    Stomach cancer Sister    Colon cancer Neg Hx    Esophageal cancer Neg Hx    Rectal cancer Neg Hx    Social History   Socioeconomic History   Marital status: Widowed    Spouse name: Not on file   Number of children: 2   Years of education: Not on file   Highest education level: Not on file  Occupational History   Occupation: Habilitation tech  Tobacco Use   Smoking status: Every Day    Packs/day: 1.00    Years: 34.00    Pack years: 34.00    Types: Cigarettes   Smokeless tobacco: Never   Tobacco comments:    Tobacco info given 10/11/15  Vaping Use   Vaping Use: Never used  Substance and Sexual Activity   Alcohol use: Not Currently   Drug use: No   Sexual activity: Not Currently    Birth control/protection: None  Other Topics Concern   Not on file  Social History Narrative   Not on file   Social Determinants of Health   Financial Resource Strain: Low Risk    Difficulty of Paying Living Expenses: Not hard at all  Food Insecurity: No Food Insecurity   Worried  About Charity fundraiser in the Last Year: Never true   [place] in the Last Year: Never true  Transportation Needs: No Transportation Needs   Lack of Transportation (Medical): No   Lack of Transportation (Non-Medical): No  Physical Activity: Inactive   Days of Exercise per Week: 0 days   Minutes of Exercise per Session: 0 min  Stress: No Stress Concern Present   Feeling of Stress : Only a little  Social Connections: Moderately Isolated   Frequency of Communication with Friends and Family: More than three times a week   Frequency of Social Gatherings with Friends and Family: Once a week  Attends Religious Services: More than 4 times per year   Active Member of Clubs or Organizations: No   Attends Archivist Meetings: Never   Marital Status: Widowed    Tobacco Counseling Ready to quit: Not Answered Counseling given: Not Answered Tobacco comments: Tobacco info given 10/11/15   Clinical Intake:  Pre-visit preparation completed: Yes        BMI - recorded: 32.12 Nutritional Status: BMI > 30  Obese Nutritional Risks: None Diabetes: Yes CBG done?: No Did pt. bring in CBG monitor from home?: No (phone visit)  How often do you need to have someone help you when you read instructions, pamphlets, or other written materials from your doctor or pharmacy?: 1 - Never  Diabetes:  Is the patient diabetic?  Yes  If diabetic, was a CBG obtained today?  No  Did the patient bring in their glucometer from home?  No phone visit How often do you monitor your CBG's? occasionally.   Financial Strains and Diabetes Management:  Are you having any financial strains with the device, your supplies or your medication? No .  Does the patient want to be seen by Chronic Care Management for management of their diabetes?  No  Would the patient like to be referred to a Nutritionist or for Diabetic Management?  No   Diabetic Exams:  Diabetic Eye Exam: Completed 2 weeks ago per  patient-awaiting notes..   Diabetic Foot Exam: Completed 07/02/2021.   Interpreter Needed?: No  Information entered by :: Caroleen Hamman LPN   Activities of Daily Living In your present state of health, do you have any difficulty performing the following activities: 09/25/2021 07/02/2021  Hearing? N N  Vision? N N  Difficulty concentrating or making decisions? N N  Walking or climbing stairs? N N  Dressing or bathing? N N  Doing errands, shopping? N N  Preparing Food and eating ? N -  Using the Toilet? N -  In the past six months, have you accidently leaked urine? N -  Do you have problems with loss of bowel control? N -  Managing your Medications? N -  Managing your Finances? N -  Housekeeping or managing your Housekeeping? N -  Some recent data might be hidden    Patient Care Team: Copland, Gay Filler, MD as PCP - General (Family Medicine)  Indicate any recent Medical Services you may have received from other than [facility] providers in the past year (date may be approximate).     Assessment:   This is a routine wellness examination for Demeka.  Hearing/Vision screen Hearing Screening - Comments:: No issues Vision Screening - Comments:: Last eye exam-Saw retina specialist 2 weeks ago  Dietary issues and exercise activities discussed: Current Exercise Habits: The patient does not participate in regular exercise at present, Exercise limited by: None identified   Goals Addressed             This Visit's Progress    Patient Stated       Increase activity       Depression Screen PHQ 2/9 Scores 09/25/2021 02/19/2021 05/13/2017 04/08/2016 01/20/2016 10/28/2015 09/26/2015  PHQ - 2 Score 1 0 0 1 0 0 0  Exception Documentation - - - - Patient refusal - -    Fall Risk Fall Risk  09/25/2021 07/02/2021 05/13/2017 04/08/2016 01/20/2016  Falls in the past year? 0 0 No No No  Number falls in past yr: 0 0 - - -  Injury with Fall? 0 0 - - -  Follow up Falls prevention discussed - - - -     FALL RISK PREVENTION PERTAINING TO THE HOME:  Any stairs in or around the home? No  Home free of loose throw rugs in walkways, pet beds, electrical cords, etc? Yes  Adequate lighting in your home to reduce risk of falls? Yes   ASSISTIVE DEVICES UTILIZED TO PREVENT FALLS:  Life alert? No  Use of a cane, walker or w/c? No  Grab bars in the bathroom? No  Shower chair or bench in shower? No  Elevated toilet seat or a handicapped toilet? No   TIMED UP AND GO:  Was the test performed? No . Phone visit   Cognitive Function:Normal cognitive status assessed by this Nurse Health Advisor. No abnormalities found.          Immunizations Immunization History  Administered Date(s) Administered   Fluad Quad(high Dose 65+) 07/02/2021   Influenza,inj,Quad PF,6+ Mos 09/07/2014, 07/26/2015, 08/06/2016, 09/06/2017, 08/03/2018, 07/24/2019, 09/12/2020   PFIZER Comirnaty(Gray Top)Covid-19 Tri-Sucrose Vaccine 02/19/2021   PFIZER(Purple Top)SARS-COV-2 Vaccination 11/03/2019, 11/28/2019, 07/19/2020   PPD Test 01/15/2014, 09/26/2015   Pneumococcal Conjugate-13 09/12/2020   Pneumococcal Polysaccharide-23 01/15/2014   Tdap 01/15/2014   Zoster, Live 07/26/2015    TDAP status: Up to date  Flu Vaccine status: Up to date  Pneumococcal vaccine status: Up to date  Covid-19 vaccine status: Information provided on how to obtain vaccines.   Qualifies for Shingles Vaccine? Yes   Zostavax completed Yes   Shingrix Completed?: No.    Education has been provided regarding the importance of this vaccine. Patient has been advised to call insurance company to determine out of pocket expense if they have not yet received this vaccine. Advised may also receive vaccine at local pharmacy or Health Dept. Verbalized acceptance and understanding.  Screening Tests Health Maintenance  Topic Date Due   Zoster Vaccines- Shingrix (1 of 2) Never done   DEXA SCAN  Never done   OPHTHALMOLOGY EXAM  07/19/2020    COVID-19 Vaccine (5 - Booster for Pfizer series) 04/16/2021   Pneumonia Vaccine 36+ Years old (3 - PPSV23 if available, else PCV20) 09/12/2021   HEMOGLOBIN A1C  12/31/2021   FOOT EXAM  07/02/2022   MAMMOGRAM  10/29/2022   TETANUS/TDAP  01/16/2024   COLONOSCOPY (Pts 45-31yr Insurance coverage will need to be confirmed)  11/06/2025   INFLUENZA VACCINE  Completed   Hepatitis C Screening  Completed   HPV VACCINES  Aged Out    Health Maintenance  Health Maintenance Due  Topic Date Due   Zoster Vaccines- Shingrix (1 of 2) Never done   DEXA SCAN  Never done   OPHTHALMOLOGY EXAM  07/19/2020   COVID-19 Vaccine (5 - Booster for Pfizer series) 04/16/2021   Pneumonia Vaccine 67 Years old (3 - PPSV23 if available, else PCV20) 09/12/2021    Colorectal cancer screening: Type of screening: Colonoscopy. Completed 11/07/2015. Repeat every 10 years  Mammogram status: Completed bilateral 10/29/2020. Repeat every year  Bone density status: Scheduled for 10/09/2021  Lung Cancer Screening: (Low Dose CT Chest recommended if Age 67-80years, 30 pack-year currently smoking OR have quit w/in 15years.) does not qualify.   Lung Cancer Screening: Completed 03/28/2021  Additional Screening:  Hepatitis C Screening: Completed 11/08/2019  Vision Screening: Recommended annual ophthalmology exams for early detection of glaucoma and other disorders of the eye. Is the patient up to date with their annual eye exam?  Yes  Who is the provider or what is the name of the  office in which the patient attends annual eye exams? Pt unsure of name   Dental Screening: Recommended annual dental exams for proper oral hygiene  Community Resource Referral / Chronic Care Management: CRR required this visit?  Yes For caregiver counseling  CCM required this visit?  No      Plan:     I have personally reviewed and noted the following in the patients chart:   Medical and social history Use of alcohol, tobacco or illicit  drugs  Current medications and supplements including opioid prescriptions. Patient is not currently taking opioid prescriptions. Functional ability and status Nutritional status Physical activity Advanced directives List of other physicians Hospitalizations, surgeries, and ER visits in previous 12 months Vitals Screenings to include cognitive, depression, and falls Referrals and appointments  In addition, I have reviewed and discussed with patient certain preventive protocols, quality metrics, and best practice recommendations. A written personalized care plan for preventive services as well as general preventive health recommendations were provided to patient.   Due to this being a telephonic visit, the after visit summary with patients personalized plan was offered to patient via mail or my-chart. Patient would like to access on my-chart.    Marta Antu, LPN   10/29/3660  Nurse health Advisor  Nurse Notes: None

## 2021-09-29 ENCOUNTER — Telehealth: Payer: Self-pay | Admitting: *Deleted

## 2021-09-29 NOTE — Chronic Care Management (AMB) (Signed)
°  Chronic Care Management   Outreach Note  09/29/2021 Name: [name] MRN: [id number] DOB: 09-Nov-1955  Bailey [name] is a 66 y.o. year old female who is a primary care patient of Copland, Gay Filler, MD. I reached out to Sherri Rad by phone today in response to a referral sent by Ms. Robbi Garter Baca's primary care provider.  An unsuccessful telephone outreach was attempted today. The patient was referred to the case management team for assistance with care management and care coordination.   Follow Up Plan: A HIPAA compliant phone message was left for the patient providing contact information and requesting a return call.  If patient returns call to provider office, please advise to call Embedded Care Management Care Guide Levetta Bognar at [place], [place] [facility]  Direct Dial: [phone]

## 2021-10-03 NOTE — Chronic Care Management (AMB) (Signed)
Chronic Care Management   Note  10/03/2021 Name: Bailey [name] MRN: [id number] DOB: 06/24/1956  [name] is a 66 y.o. year old female who is a primary care patient of Copland, Gay Filler, MD. I reached out to Sherri Rad by phone today in response to a referral sent by Ms. Robbi Garter Widdowson's PCP.  Ms. Harder was given information about Chronic Care Management services today including:  CCM service includes personalized support from designated clinical staff supervised by her physician, including individualized plan of care and coordination with other care providers 24/7 contact phone numbers for assistance for urgent and routine care needs. Service will only be billed when office clinical staff spend 20 minutes or more in a month to coordinate care. Only one practitioner may furnish and bill the service in a calendar month. The patient may stop CCM services at any time (effective at the end of the month) by phone call to the office staff. The patient is responsible for co-pay (up to 20% after annual deductible is met) if co-pay is required by the individual health plan.   Patient agreed to services and verbal consent obtained.   Follow up plan: Telephone appointment with care management team member scheduled for:10/23/2021  Julian Hy, [place], [place] [facility]  Direct Dial: [phone]

## 2021-10-08 ENCOUNTER — Other Ambulatory Visit: Payer: Self-pay | Admitting: Family Medicine

## 2021-10-08 DIAGNOSIS — Z1231 Encounter for screening mammogram for malignant neoplasm of breast: Secondary | ICD-10-CM

## 2021-10-09 ENCOUNTER — Other Ambulatory Visit: Payer: Self-pay | Admitting: Family Medicine

## 2021-10-09 ENCOUNTER — Other Ambulatory Visit: Payer: Medicare HMO

## 2021-10-09 DIAGNOSIS — I10 Essential (primary) hypertension: Secondary | ICD-10-CM

## 2021-10-10 ENCOUNTER — Telehealth: Payer: Self-pay

## 2021-10-10 NOTE — Telephone Encounter (Signed)
° °  Telephone encounter was:  Successful.  10/10/2021 Name: Jeyda Siebel MRN: [id number] DOB: [birth year]  Alyrica Thurow is a 66 y.o. year old female who is a primary care patient of Copland, Gay Filler, MD . The community resource team was consulted for assistance with  caregiver resources.  Care guide performed the following interventions: Spoke with patient about Family Caregiver Support Program/[facility].  Patient gave permission to place a referral through [facility].  Also gave information for Senior Resources of [place] Support Program.  Follow Up Plan:  Care guide will follow up with patient by phone over the next 7-10 days.  Seydou Hearns, AAS Paralegal, [place] [facility]  [address], [place] [address] ??[email or web address]   ?? [phone]   [email or web address]

## 2021-10-16 ENCOUNTER — Encounter: Payer: Self-pay | Admitting: Family Medicine

## 2021-10-16 ENCOUNTER — Ambulatory Visit
Admission: RE | Admit: 2021-10-16 | Discharge: 2021-10-16 | Disposition: A | Discharge status: Home / Self Care | Payer: Medicare HMO | Source: Ambulatory Visit | Attending: Family Medicine | Admitting: Family Medicine

## 2021-10-16 ENCOUNTER — Other Ambulatory Visit: Payer: Self-pay

## 2021-10-16 ENCOUNTER — Telehealth: Payer: Self-pay

## 2021-10-16 DIAGNOSIS — E2839 Other primary ovarian failure: Secondary | ICD-10-CM

## 2021-10-16 DIAGNOSIS — Z1231 Encounter for screening mammogram for malignant neoplasm of breast: Secondary | ICD-10-CM | POA: Diagnosis not present

## 2021-10-16 DIAGNOSIS — Z780 Asymptomatic menopausal state: Secondary | ICD-10-CM | POA: Diagnosis not present

## 2021-10-16 DIAGNOSIS — M810 Age-related osteoporosis without current pathological fracture: Secondary | ICD-10-CM | POA: Insufficient documentation

## 2021-10-16 DIAGNOSIS — M85852 Other specified disorders of bone density and structure, left thigh: Secondary | ICD-10-CM | POA: Diagnosis not present

## 2021-10-16 NOTE — Telephone Encounter (Signed)
° °  Telephone encounter was:  Successful.  10/16/2021 Name: Bailey [name] MRN: [id number] DOB: 05-19-1956  Bailey [name] is a 66 y.o. year old female who is a primary care patient of Copland, Gay Filler, MD . The community resource team was consulted for assistance with  caregiver resources  Care guide performed the following interventions: Spoke with Bailey [name] at [place] to follow up on [employer] referral submitted 10/10/21. Kenney Houseman has referred this to [facility] at [employer] [facility] [place].  The information for Senior Resources of [facility] [place] was given to the patient on 10/10/21.   Follow Up Plan:  No further follow up planned at this time. The patient has been provided with needed resources.  Darleene Cumpian, AAS Paralegal, [place] [facility]  [address], [place] [address] ??[email or web address]   ?? [phone]   [email or web address]

## 2021-10-23 ENCOUNTER — Ambulatory Visit (INDEPENDENT_AMBULATORY_CARE_PROVIDER_SITE_OTHER): Payer: Medicare HMO | Admitting: *Deleted

## 2021-10-23 DIAGNOSIS — E119 Type 2 diabetes mellitus without complications: Secondary | ICD-10-CM

## 2021-10-23 DIAGNOSIS — D562 Delta-beta thalassemia: Secondary | ICD-10-CM

## 2021-10-23 DIAGNOSIS — E1140 Type 2 diabetes mellitus with diabetic neuropathy, unspecified: Secondary | ICD-10-CM

## 2021-10-23 DIAGNOSIS — R748 Abnormal levels of other serum enzymes: Secondary | ICD-10-CM

## 2021-10-23 DIAGNOSIS — I10 Essential (primary) hypertension: Secondary | ICD-10-CM

## 2021-10-23 DIAGNOSIS — E785 Hyperlipidemia, unspecified: Secondary | ICD-10-CM

## 2021-10-23 NOTE — Chronic Care Management (AMB) (Signed)
Chronic Care Management    Clinical Social Work Note  10/23/2021 Name: Bailey [name] MRN: [id number] DOB: 1956-05-03  Bailey [name] is a 66 y.o. year old female who is a primary care patient of Copland, Gay Filler, MD. The CCM team was consulted to assist the patient with chronic disease management and/or care coordination needs related to: [employer] and Caregiver Stress.   Engaged with patient by telephone for initial visit in response to provider referral for social work chronic care management and care coordination services.   Consent to Services:  The patient was given information about Chronic Care Management services, agreed to services, and gave verbal consent prior to initiation of services.  Please see initial visit note for detailed documentation.   Patient agreed to services and consent obtained.   Assessment: Review of patient past medical history, allergies, medications, and health status, including review of relevant consultants reports was performed today as part of a comprehensive evaluation and provision of chronic care management and care coordination services.     SDOH (Social Determinants of Health) assessments and interventions performed:  SDOH Interventions    Flowsheet Row Most Recent Value  SDOH Interventions   Food Insecurity Interventions Intervention Not Indicated  Financial Strain Interventions Intervention Not Indicated  Housing Interventions Intervention Not Indicated  Intimate Partner Violence Interventions Intervention Not Indicated  Physical Activity Interventions Intervention Not Indicated, Other (Comments)  [Encouraged to Increase level of Activity and Exercise]  Stress Interventions Offered [employer], Provide Counseling, Patient Refused  Social Connections Interventions Intervention Not Indicated  Transportation Interventions Intervention Not Indicated        Advanced Directives Status: Not ready or willing  to discuss.  CCM Care Plan  No Known Allergies  Outpatient Encounter Medications as of 10/23/2021  Medication Sig   lisinopril (ZESTRIL) 20 MG tablet Take 1 tablet by mouth once daily   Accu-Chek Softclix Lancets lancets Use to check blood sugar once a day. Dx code: E11.9   acetaminophen (TYLENOL) 500 MG tablet Take 1,000 mg by mouth every 6 (six) hours as needed (pain). Reported on 04/08/2016   albuterol (VENTOLIN HFA) 108 (90 Base) MCG/ACT inhaler Inhale 2 puffs into the lungs every 6 (six) hours as needed for wheezing or shortness of breath.   atorvastatin (LIPITOR) 40 MG tablet TAKE 1 TABLET EVERY DAY   blood glucose meter kit and supplies KIT Dispense based on patient and insurance preference. Use up to four times daily as directed. (FOR ICD-9 250.00, 250.01).   Blood Glucose Monitoring Suppl (ACCU-CHEK GUIDE ME) w/Device KIT Use to check blood sugar once a day. Dx code: E11.9   cholestyramine (QUESTRAN) 4 g packet DISSOLVE & TAKE 1 POWDER PACKET BY MOUTH TWICE DAILY   cyclobenzaprine (FLEXERIL) 5 MG tablet Take 1 tablet (5 mg total) by mouth at bedtime. Use as needed for neck pain   dicyclomine (BENTYL) 20 MG tablet Take 1 tablet (20 mg total) by mouth every 4 (four) hours.   glimepiride (AMARYL) 4 MG tablet TAKE 2 TABLETS EVERY DAY WITH BREAKFAST   glucose blood (ACCU-CHEK GUIDE) test strip USE ONE TIME DAILY TO TEST BLOOD SUGAR   INVOKANA 100 MG TABS tablet TAKE 1 TABLET BY MOUTH ONCE DAILY BEFORE BREAKFAST   metFORMIN (GLUCOPHAGE) 1000 MG tablet TAKE 1 TABLET BY MOUTH TWICE DAILY WITH MEALS. APPOINTMENT REQUIRED FOR FUTURE REFILLS.   pantoprazole (PROTONIX) 40 MG tablet TAKE 1 TABLET TWICE DAILY   predniSONE (DELTASONE) 20 MG tablet Take one tablet  by mouth daily for 5 days   sucralfate (CARAFATE) 1 GM/10ML suspension Take 10 mLs (1 g total) by mouth 4 (four) times daily -  with meals and at bedtime.   triamcinolone cream (KENALOG) 0.1 % Apply 1 application topically 2 (two) times  daily. Use as needed on dry or scaly areas   No facility-administered encounter medications on file as of 10/23/2021.    Patient Active Problem List   Diagnosis Date Noted   Osteoporosis 10/16/2021   Type 2 diabetes mellitus with diabetic neuropathy, unspecified ([place]) 07/02/2021   Delta beta thalassemia ([place]) 11/22/2019   Diabetic retinopathy ([place]) 02/20/2017   Fatty liver 11/24/2016   GERD (gastroesophageal reflux disease) 07/26/2015   Hyperlipidemia 07/26/2015   Dyspepsia 07/26/2015   Essential hypertension 07/26/2015   Fibrocystic breast disease 03/22/2015   Type 2 diabetes mellitus treated without insulin ([place]) 02/16/2013    Conditions to be addressed/monitored: HTN, DMII, and Caregiver Stress.  Film/video editor, Limited Access to Caregiver, and [employer] of [employer].  Care Plan : LCSW Plan of Care  Updates made by Francis Gaines, LCSW since 10/23/2021 12:00 AM     Problem: Find Help in My Community. Resolved 10/23/2021  Priority: High     Goal: Find Help in My Community. Completed 10/23/2021  Start Date: 10/23/2021  Expected End Date: 10/23/2021  This Visit's Progress: On track  Priority: High  Note:   Current Barriers:   Patient with Hypertension, Type II Diabetes Mellitus with Diabetic Neuropathy and Retinopathy, Osteoporosis, Hyperlipidemia, Delta Beta Thalassemia, and Caregiver Stress needs Support, Education, Resources, Referrals, Advocacy, and Care Coordination, to resolve unmet personal care needs in the home for 67 year old disabled sister. Patient is still trying to work full-time, as well as perform primary caregiver services for 67 year old disabled sister, currently residing with patient.   Patient's sister is unable to consistently perform ADL's/IADL's independently, or self-administer medications as prescribed. Financial constraints due to patient's 67 year old disabled sister not having any source of income or health insurance coverage, and  ineligible to receive Social Security Disability or Adult Medicaid coverage.   Patient lacks knowledge of available community agencies and resources, nor is she aware of how to become a paid in-home caregiver for her 67 year old disabled sister.   Clinical Goals:  Patient's sister will have in-home care services in place, either through Rosealee Albee, Education officer, museum II with the [place] Program, or through a private agency of choice. Patient will work with LCSW and Rosealee Albee, Social Worker II with the [place] Program, to coordinate care for in-home aide services for 67 year old disabled sister. Patient will receive, review and consider arranging in-home care services for her 67 year old disabled sister through a private agency of choice, from the list provided. Patient will attend all scheduled medical appointments, as evidenced by patient report and care team review of appointment completion in electronic medical record. Patient will demonstrate improved health management independence, as evidenced by having in-home care services in place for 68 year old disabled sister. Clinical Interventions: Patient interviewed and appropriate assessments performed. Collaboration with Primary Care Physician, Dr. Janett Billow Copland regarding development and update of comprehensive plan of care as evidenced by provider attestation and co-signature. Inter-disciplinary care team collaboration (see longitudinal plan of care). Interventions performed:  Problem Solving/Task Centered, Psychoeducation/Health Education, Quality of Sleep Assessed and Sleep Hygiene Techniques Promoted, Caregiver Stress Acknowledged and Consideration of In-Home Care Services Encouraged. Referral placed  to Rosealee Albee, Social Worker II with the [place],  for 67 year old disabled sister. Collaboration with Rosealee Albee, Social Worker II with the [place] Program, to confirm receipt of application for processing.   Emailed ([email or web address]) patient a complete list of [place], [place] Service Providers, and Adult Day Care Programs, for review and consideration. Provided education to patient regarding level of care options for 67 year old disabled sister. Assessed needs, level of care concerns, basic eligibility and provided education on [employer] application process. Identified resources and durable medical equipment needed in the home to improve safety and promote independence for 67 year old disabled sister. Patient Goals/Self-Care Activities:  Follow-up with Rosealee Albee, Social Worker II with the [place] Program, to check the status of your 67 year old disabled sister's application process. Review list of private agencies providing in-home care services, from the list provided, and begin contacting agencies of interest:  ~ [place]  ~ [place]  ~ [place] Providers  ~ Adult Day Care Programs Contact LCSW [phone]) if you have questions, need assistance, or if additional social work needs are identified in the near future. No Follow-Up Required.    [place] Licensed Clinical Social Worker [facility] [phone]

## 2021-10-23 NOTE — Patient Instructions (Signed)
Visit Information   Thank you for taking time to visit with me today. Please don't hesitate to contact me if I can be of assistance to you before our next scheduled telephone appointment.  Following are the goals we discussed today:  Patient Goals/Self-Care Activities:  Follow-up with Rosealee Albee, Social Worker II with the [place] Program, to check the status of your 67 year old disabled sister's application process. Review list of private agencies providing in-home care services, from the list provided, and begin contacting agencies of interest:  ~ [place]  ~ [place]  ~ [place] Providers  ~ Adult Day Care Programs Contact LCSW [phone]) if you have questions, need assistance, or if additional social work needs are identified in the near future. No Follow-Up Required.    Please call the care guide team at [phone] if you need to cancel or reschedule your appointment.   If you are experiencing a Mental Health or [place] or need someone to talk to, please call the Suicide and Crisis Lifeline: 988 call the [place] National Suicide Prevention Lifeline: 343-180-5656 or TTY: 365-858-0643 TTY (980)723-9184) to talk to a trained counselor call 1-800-273-TALK (toll free, 24 hour hotline) go to [facility] [address] [place] [phone]) call the [facility]: [phone] call 911   Following is a copy of your full care plan:  Care Plan : LCSW Plan of Care  Updates made by Francis Gaines, LCSW since 10/23/2021 12:00 AM     Problem: Find Help in My Community. Resolved 10/23/2021  Priority: High     Goal: Find Help in My Community. Completed 10/23/2021  Start Date: 10/23/2021  Expected End Date: 10/23/2021  This Visit's Progress: On track  Priority: High  Note:   Current Barriers:    Patient with Hypertension, Type II Diabetes Mellitus with Diabetic Neuropathy and Retinopathy, Osteoporosis, Hyperlipidemia, Delta Beta Thalassemia, and Caregiver Stress needs Support, Education, Resources, Referrals, Advocacy, and Care Coordination, to resolve unmet personal care needs in the home for 67 year old disabled sister. Patient is still trying to work full-time, as well as perform primary caregiver services for 67 year old disabled sister, currently residing with patient.   Patient's sister is unable to consistently perform ADL's/IADL's independently, or self-administer medications as prescribed. Financial constraints due to patient's 67 year old disabled sister not having any source of income or health insurance coverage, and ineligible to receive Social Security Disability or Adult Medicaid coverage.   Patient lacks knowledge of available community agencies and resources, nor is she aware of how to become a paid in-home caregiver for her 67 year old disabled sister.   Clinical Goals:  Patient's sister will have in-home care services in place, either through Rosealee Albee, Education officer, museum II with the [place] Program, or through a private agency of choice. Patient will work with LCSW and Rosealee Albee, Social Worker II with the [place] Program, to coordinate care for in-home aide services for 67 year old disabled sister. Patient will receive, review and consider arranging in-home care services for her 67 year old disabled sister through a private agency of choice, from the list provided. Patient will attend all scheduled medical appointments, as evidenced by patient report and care team review of appointment completion in electronic medical record. Patient will demonstrate improved health management independence, as evidenced by having in-home  care services in place for  67 year old disabled sister. Clinical Interventions: Patient interviewed and appropriate assessments performed. Collaboration with Primary Care Physician, Dr. Janett Billow Copland regarding development and update of comprehensive plan of care as evidenced by provider attestation and co-signature. Inter-disciplinary care team collaboration (see longitudinal plan of care). Interventions performed:  Problem Solving/Task Centered, Psychoeducation/Health Education, Quality of Sleep Assessed and Sleep Hygiene Techniques Promoted, Caregiver Stress Acknowledged and Consideration of In-Home Care Services Encouraged. Referral placed to Rosealee Albee, Social Worker II with the [place], for 67 year old disabled sister. Collaboration with Rosealee Albee, Social Worker II with the [place] Program, to confirm receipt of application for processing.   Emailed ([email or web address]) patient a complete list of [place], [place] Service Providers, and Adult Day Care Programs, for review and consideration. Provided education to patient regarding level of care options for 67 year old disabled sister. Assessed needs, level of care concerns, basic eligibility and provided education on [employer] application process. Identified resources and durable medical equipment needed in the home to improve safety and promote independence for 67 year old disabled sister. Patient Goals/Self-Care Activities:  Follow-up with Rosealee Albee, Social Worker II with the [place] Program, to check the status of your 67 year old disabled sister's application process. Review list of private agencies providing in-home care services, from the list provided, and begin contacting agencies of interest:  ~  [place]  ~ [place]  ~ [place] Providers  ~ Adult Day Care Programs Contact LCSW [phone]) if you have questions, need assistance, or if additional social work needs are identified in the near future. No Follow-Up Required.          Consent to CCM Services: Ms. Brys was given information about Chronic Care Management services including:  CCM service includes personalized support from designated clinical staff supervised by her physician, including individualized plan of care and coordination with other care providers 24/7 contact phone numbers for assistance for urgent and routine care needs. Service will only be billed when office clinical staff spend 20 minutes or more in a month to coordinate care. Only one practitioner may furnish and bill the service in a calendar month. The patient may stop CCM services at any time (effective at the end of the month) by phone call to the office staff. The patient will be responsible for cost sharing (co-pay) of up to 20% of the service fee (after annual deductible is met).  Patient agreed to services and verbal consent obtained.   Patient verbalizes understanding of instructions and care plan provided today and agrees to view in [place]. Active MyChart status confirmed with patient.    [place] Licensed Clinical Social Worker [facility] [phone]

## 2021-10-30 ENCOUNTER — Ambulatory Visit: Payer: Medicare HMO

## 2021-11-07 ENCOUNTER — Other Ambulatory Visit: Payer: Self-pay | Admitting: Family Medicine

## 2021-11-13 DIAGNOSIS — H2512 Age-related nuclear cataract, left eye: Secondary | ICD-10-CM | POA: Diagnosis not present

## 2021-11-13 DIAGNOSIS — E113393 Type 2 diabetes mellitus with moderate nonproliferative diabetic retinopathy without macular edema, bilateral: Secondary | ICD-10-CM | POA: Diagnosis not present

## 2021-11-13 DIAGNOSIS — H35033 Hypertensive retinopathy, bilateral: Secondary | ICD-10-CM | POA: Diagnosis not present

## 2021-11-13 DIAGNOSIS — H04123 Dry eye syndrome of bilateral lacrimal glands: Secondary | ICD-10-CM | POA: Diagnosis not present

## 2021-11-13 LAB — HM DIABETES EYE EXAM

## 2021-11-14 ENCOUNTER — Encounter: Payer: Self-pay | Admitting: *Deleted

## 2021-11-18 DIAGNOSIS — E1169 Type 2 diabetes mellitus with other specified complication: Secondary | ICD-10-CM

## 2021-11-18 DIAGNOSIS — I10 Essential (primary) hypertension: Secondary | ICD-10-CM

## 2021-11-18 DIAGNOSIS — Z7984 Long term (current) use of oral hypoglycemic drugs: Secondary | ICD-10-CM

## 2021-12-11 ENCOUNTER — Other Ambulatory Visit: Payer: Self-pay | Admitting: Family Medicine

## 2021-12-11 DIAGNOSIS — E785 Hyperlipidemia, unspecified: Secondary | ICD-10-CM

## 2021-12-11 DIAGNOSIS — E119 Type 2 diabetes mellitus without complications: Secondary | ICD-10-CM

## 2021-12-28 NOTE — Patient Instructions (Addendum)
Good to see you again today- assuming all is well please see me in about 6 months ?Pneumonia vaccine given today ?Please consider getting the shingles vaccine series (shingrix) and covid booster at your pharmacy ?We will start you on Fosamax once a week for your bone density - rescan in 2 years ?Referral to dermatology for the skin lesion on your back today- however if they are not able to see you in the next couple of months please schedule a biopsy appt with me instead  ? ?Let me know if you do wish to try medication for depression/ stress  ?

## 2021-12-28 NOTE — Progress Notes (Addendum)
Therapist, music at [employer] ?[address], [address] ?[place], [place] [address] ?336 [phone] ?Fax [phone] ? ?Date:  01/05/2022  ? ?Name:  Bailey [name]   DOB:  06-11-1956   MRN:  [id number] ? ?PCP:  Darreld Mclean, MD  ? ? ?Chief Complaint: 6 month follow up (Concerns/ questions: 1.Currently being treated for a UTI. 3 days left on antibiotic. 2. She says she has had no energy. ) ? ? ?History of Present Illness: ? ?Bailey [name] is a 66 y.o. very pleasant female patient who presents with the following: ? ?Pt seen today for follow-up ?Last visit with myself in December for a resp illness  ?History of hyperlipidemia, diabetes with retinopathy, hypertension, GERD, beta thalassemia ? ?Shingrix- recommend  ?Dexa - done via mobile January - she does have osteoporosis and would like to start on fosamax  ?Covid booster- she plans to do this soon ?Can use a 2nd dose of pneumovax- give today  ? ?CMP, CBC, lipids, A1c done in October  ? ?She is being treated for a UTI currently- by her GYN  ? ?She is taking care of her younger sister who is disabled- she lives with her in a 2 bedroom appt.  This is stressful but pt feels a strong sense of duty to take care of her family  ?We discussed trying medication for depression but she declines at this time  ? ?She had a mobile Dexa done in January: she would like to start treatment for osteoporosis  ?DUAL X-RAY ABSORPTIOMETRY (DXA) FOR BONE MINERAL DENSITY ?ASSESSMENT: Patient's diagnostic category is OSTEOPOROSIS by WHO ?Criteria. ? ? ?Lab Results  ?Component Value Date  ? HGBA1C 6.7 (H) 07/02/2021  ? ? ? ? ?Patient Active Problem List  ? Diagnosis Date Noted  ? Osteoporosis 10/16/2021  ? Type 2 diabetes mellitus with diabetic neuropathy, unspecified ([place]) 07/02/2021  ? Delta beta thalassemia ([place]) 11/22/2019  ? Diabetic retinopathy ([place]) 02/20/2017  ? Fatty liver 11/24/2016  ? GERD (gastroesophageal reflux disease) 07/26/2015  ? Hyperlipidemia  07/26/2015  ? Dyspepsia 07/26/2015  ? Essential hypertension 07/26/2015  ? Fibrocystic breast disease 03/22/2015  ? Type 2 diabetes mellitus treated without insulin ([place]) 02/16/2013  ? ? ?Past Medical History:  ?Diagnosis Date  ? Arthritis   ? Cataract   ? COPD (chronic obstructive pulmonary disease) ([place])   ? Diabetes mellitus without complication ([place])   ? Fibrocystic breast disease July 2016  ? GERD (gastroesophageal reflux disease)   ? Hyperlipidemia   ? Hypertension   ? ? ?Past Surgical History:  ?Procedure Laterality Date  ? BREAST BIOPSY Right 04/19/2015  ? benign  ? BREAST BIOPSY Right 11/01/2020  ? fibroadenoma  ? CATARACT EXTRACTION    ? CHOLECYSTECTOMY    ? COLONOSCOPY    ? GALLBLADDER SURGERY    ? UTERINE FIBROID SURGERY    ? ? ?Social History  ? ?Tobacco Use  ? Smoking status: Every Day  ?  Packs/day: 1.00  ?  Years: 34.00  ?  Pack years: 34.00  ?  Types: Cigarettes  ?  Passive exposure: Current  ? Smokeless tobacco: Never  ? Tobacco comments:  ?  Tobacco info given 10/11/15  ?Vaping Use  ? Vaping Use: Never used  ?Substance Use Topics  ? Alcohol use: Not Currently  ? Drug use: No  ? ? ?Family History  ?Problem Relation Age of Onset  ? Diabetes Mother   ? Stroke Mother   ?  Dementia Mother   ? Heart disease Father   ? Diabetes Father   ? Diabetes Brother   ? Dementia Brother   ? Renal Disease Brother   ? Diabetes Sister   ? Narcolepsy Sister   ? Arthritis Sister   ? Diabetes Sister   ? Obesity Sister   ? Diabetes Brother   ? Heart disease Brother   ? Cancer Brother   ?     pancreatic  ? Diabetes Brother   ? Diabetes Brother   ? Stomach cancer Sister   ? Colon cancer Neg Hx   ? Esophageal cancer Neg Hx   ? Rectal cancer Neg Hx   ? ? ?No Known Allergies ? ?Medication list has been reviewed and updated. ? ?Current Outpatient Medications on File Prior to Visit  ?Medication Sig Dispense Refill  ? Accu-Chek Softclix Lancets lancets Use to check blood sugar once a day. Dx code: E11.9 100 each 1  ? acetaminophen  (TYLENOL) 500 MG tablet Take 1,000 mg by mouth every 6 (six) hours as needed (pain). Reported on 04/08/2016    ? albuterol (VENTOLIN HFA) 108 (90 Base) MCG/ACT inhaler Inhale 2 puffs into the lungs every 6 (six) hours as needed for wheezing or shortness of breath. 1 each 3  ? atorvastatin (LIPITOR) 40 MG tablet TAKE 1 TABLET EVERY DAY 90 tablet 1  ? blood glucose meter kit and supplies KIT Dispense based on patient and insurance preference. Use up to four times daily as directed. (FOR ICD-9 250.00, 250.01). 1 each 0  ? Blood Glucose Monitoring Suppl (ACCU-CHEK GUIDE ME) w/Device KIT Use to check blood sugar once a day. Dx code: E11.9 1 kit 0  ? Boric Acid Vaginal (AZO BORIC ACID) 600 MG SUPP Place 1 suppository vaginally at bedtime. 14 suppository 0  ? cholestyramine (QUESTRAN) 4 g packet DISSOLVE & TAKE 1 POWDER PACKET BY MOUTH TWICE DAILY 180 packet 3  ? cyclobenzaprine (FLEXERIL) 5 MG tablet Take 1 tablet (5 mg total) by mouth at bedtime. Use as needed for neck pain 30 tablet 1  ? dicyclomine (BENTYL) 20 MG tablet Take 1 tablet (20 mg total) by mouth every 4 (four) hours. 120 tablet 3  ? glimepiride (AMARYL) 4 MG tablet TAKE 2 TABLETS EVERY DAY WITH BREAKFAST 180 tablet 1  ? glucose blood (ACCU-CHEK GUIDE) test strip USE ONE TIME DAILY TO TEST BLOOD SUGAR 100 strip 1  ? INVOKANA 100 MG TABS tablet TAKE 1 TABLET BY MOUTH ONCE DAILY BEFORE BREAKFAST 90 tablet 1  ? lisinopril (ZESTRIL) 20 MG tablet Take 1 tablet by mouth once daily 90 tablet 0  ? metFORMIN (GLUCOPHAGE) 1000 MG tablet TAKE 1 TABLET BY MOUTH TWICE DAILY WITH  MEALS 180 tablet 0  ? metroNIDAZOLE (FLAGYL) 500 MG tablet Take 1 tablet (500 mg total) by mouth 2 (two) times daily. 14 tablet 2  ? pantoprazole (PROTONIX) 40 MG tablet TAKE 1 TABLET TWICE DAILY 180 tablet 1  ? predniSONE (DELTASONE) 20 MG tablet Take one tablet by mouth daily for 5 days 5 tablet 0  ? sucralfate (CARAFATE) 1 GM/10ML suspension Take 10 mLs (1 g total) by mouth 4 (four) times daily  -  with meals and at bedtime. 420 mL 3  ? sulfamethoxazole-trimethoprim (BACTRIM DS) 800-160 MG tablet Take 1 tablet by mouth 2 (two) times daily. 14 tablet 0  ? triamcinolone cream (KENALOG) 0.1 % Apply 1 application topically 2 (two) times daily. Use as needed on dry or scaly areas 60  g 1  ? ?No current facility-administered medications on file prior to visit.  ? ? ?Review of Systems: ? ?As per HPI- otherwise negative. ? ? ?Physical Examination: ?Vitals:  ? 01/05/22 0820  ?BP: 140/82  ?Pulse: 95  ?Resp: 18  ?Temp: 98 ?F (36.7 ?C)  ?SpO2: 99%  ? ?Vitals:  ? 01/05/22 0820  ?Weight: 196 lb 12.8 oz (89.3 kg)  ?Height: 5' 5"  (1.651 m)  ? ?Body mass index is 32.75 kg/m?. ?Ideal Body Weight: Weight in (lb) to have BMI = 25: 149.9 ? ?GEN: no acute distress.  Mild obesity, looks well  ?HEENT: Atraumatic, Normocephalic.  Bilateral TM wnl, oropharynx normal.  PEERL,EOMI.   ?Ears and Nose: No external deformity. ?CV: RRR, No M/G/R. No JVD. No thrill. No extra heart sounds. ?PULM: CTA B, no wheezes, crackles, rhonchi. No retractions. No resp. distress. No accessory muscle use. ?ABD: S, NT, ND, +BS. No rebound. No HSM. ?EXTR: No c/c/e ?PSYCH: Normally interactive. Conversant.  ?She notes a nodule on her left flank- there for a few months ? ? ? ? ?Assessment and Plan: ?Type 2 diabetes mellitus with diabetic neuropathy, without long-term current use of insulin ([place]) - Plan: Hemoglobin A1c ? ?Essential hypertension - Plan: Comp Met (CMET) ? ?Delta beta thalassemia ([place]) ? ?Screening for thyroid disorder - Plan: TSH ? ?Age-related osteoporosis without current pathological fracture - Plan: alendronate (FOSAMAX) 70 MG tablet ? ?Immunization due - Plan: Pneumococcal polysaccharide vaccine 23-valent greater than or equal to 2yo subcutaneous/IM ? ?Skin lesion of back - Plan: Ambulatory referral to Dermatology ? ?Following up today ?Start fosamax ?Give pneumonia booster ?Nodule- ?fibroadenoma but pigmentation is concerning  ?Referral to  derm- if she cannot get in for several months however I asked her to see me for a bx ?Will plan further follow- up pending labs. ? ? ?Signed ?Lamar Blinks, MD ? ?Received labs as below, message to pa

## 2021-12-31 ENCOUNTER — Other Ambulatory Visit (HOSPITAL_COMMUNITY)
Admission: RE | Admit: 2021-12-31 | Discharge: 2021-12-31 | Disposition: A | Discharge status: Home / Self Care | Payer: Medicare HMO | Source: Ambulatory Visit | Attending: Obstetrics | Admitting: Obstetrics

## 2021-12-31 ENCOUNTER — Encounter: Payer: Self-pay | Admitting: Obstetrics

## 2021-12-31 ENCOUNTER — Ambulatory Visit (INDEPENDENT_AMBULATORY_CARE_PROVIDER_SITE_OTHER): Payer: Medicare HMO | Admitting: Obstetrics

## 2021-12-31 VITALS — BP 177/80 | HR 111 | Wt 196.5 lb

## 2021-12-31 DIAGNOSIS — I10 Essential (primary) hypertension: Secondary | ICD-10-CM

## 2021-12-31 DIAGNOSIS — Z1151 Encounter for screening for human papillomavirus (HPV): Secondary | ICD-10-CM | POA: Insufficient documentation

## 2021-12-31 DIAGNOSIS — Z01419 Encounter for gynecological examination (general) (routine) without abnormal findings: Secondary | ICD-10-CM | POA: Diagnosis not present

## 2021-12-31 DIAGNOSIS — E139 Other specified diabetes mellitus without complications: Secondary | ICD-10-CM | POA: Diagnosis not present

## 2021-12-31 DIAGNOSIS — R350 Frequency of micturition: Secondary | ICD-10-CM

## 2021-12-31 DIAGNOSIS — Z716 Tobacco abuse counseling: Secondary | ICD-10-CM | POA: Diagnosis not present

## 2021-12-31 DIAGNOSIS — N814 Uterovaginal prolapse, unspecified: Secondary | ICD-10-CM | POA: Diagnosis not present

## 2021-12-31 DIAGNOSIS — E669 Obesity, unspecified: Secondary | ICD-10-CM | POA: Diagnosis not present

## 2021-12-31 LAB — POCT URINALYSIS DIPSTICK
Bilirubin, UA: NEGATIVE
Glucose, UA: POSITIVE — AB
Ketones, UA: NEGATIVE
Leukocytes, UA: NEGATIVE
Nitrite, UA: NEGATIVE
Odor: NEGATIVE
Protein, UA: NEGATIVE
Spec Grav, UA: 1.015 (ref 1.010–1.025)
Urobilinogen, UA: 0.2 E.U./dL
pH, UA: 7 (ref 5.0–8.0)

## 2021-12-31 MED ORDER — SULFAMETHOXAZOLE-TRIMETHOPRIM 800-160 MG PO TABS: 1.0000 | ORAL_TABLET | Freq: Two times a day (BID) | ORAL | 0 refills | Status: DC

## 2021-12-31 NOTE — Progress Notes (Signed)
Pt reports urinary frequency x 1 week and inner vaginal itching x 1 week.  ? ?

## 2021-12-31 NOTE — Progress Notes (Signed)
? ?Subjective: ? ? ?  ?  ? Bailey [name] is a 66 y.o. female here for a routine exam.  Current complaints: Urinary frequency..   ? ?Personal health questionnaire:  ?Is patient Ashkenazi Jewish, have a family history of breast and/or ovarian cancer: no ?Is there a family history of uterine cancer diagnosed at age < 23, gastrointestinal cancer, urinary tract cancer, family member who is a Field seismologist syndrome-associated carrier: no ?Is the patient overweight and hypertensive, family history of diabetes, personal history of gestational diabetes, preeclampsia or PCOS: yes ?Is patient over 74, have PCOS,  family history of premature CHD under age 2, diabetes, smoke, have hypertension or peripheral artery disease:  yes ?At any time, has a partner hit, kicked or otherwise hurt or frightened you?: no ?Over the past 2 weeks, have you felt down, depressed or hopeless?: no ?Over the past 2 weeks, have you felt little interest or pleasure in doing things?:no ? ? ?Gynecologic History ?No LMP recorded. Patient is postmenopausal. ?Contraception: post menopausal status ?Last Pap: unknown. Results were: unknown ?Last mammogram: 10-16-2021. Results were: normal ? ?Obstetric History ?OB History  ?Gravida Para Term Preterm AB Living  ?2 2 2     2   ?SAB IAB Ectopic Multiple Live Births  ?        2  ?  ?# Outcome Date GA Lbr Len/2nd Weight Sex Delivery Anes PTL Lv  ?2 Term      Vag-Spont     ?1 Term      Vag-Spont     ? ? ?Past Medical History:  ?Diagnosis Date  ? Arthritis   ? Cataract   ? COPD (chronic obstructive pulmonary disease) ([place])   ? Diabetes mellitus without complication ([place])   ? Fibrocystic breast disease July 2016  ? GERD (gastroesophageal reflux disease)   ? Hyperlipidemia   ? Hypertension   ?  ?Past Surgical History:  ?Procedure Laterality Date  ? BREAST BIOPSY Right 04/19/2015  ? benign  ? BREAST BIOPSY Right 11/01/2020  ? fibroadenoma  ? CATARACT EXTRACTION    ? CHOLECYSTECTOMY    ? COLONOSCOPY    ? GALLBLADDER  SURGERY    ? UTERINE FIBROID SURGERY    ?  ? ?Current Outpatient Medications:  ?  Accu-Chek Softclix Lancets lancets, Use to check blood sugar once a day. Dx code: E11.9, Disp: 100 each, Rfl: 1 ?  albuterol (VENTOLIN HFA) 108 (90 Base) MCG/ACT inhaler, Inhale 2 puffs into the lungs every 6 (six) hours as needed for wheezing or shortness of breath., Disp: 1 each, Rfl: 3 ?  atorvastatin (LIPITOR) 40 MG tablet, TAKE 1 TABLET EVERY DAY, Disp: 90 tablet, Rfl: 1 ?  blood glucose meter kit and supplies KIT, Dispense based on patient and insurance preference. Use up to four times daily as directed. (FOR ICD-9 250.00, 250.01)., Disp: 1 each, Rfl: 0 ?  Blood Glucose Monitoring Suppl (ACCU-CHEK GUIDE ME) w/Device KIT, Use to check blood sugar once a day. Dx code: E11.9, Disp: 1 kit, Rfl: 0 ?  cholestyramine (QUESTRAN) 4 g packet, DISSOLVE & TAKE 1 POWDER PACKET BY MOUTH TWICE DAILY, Disp: 180 packet, Rfl: 3 ?  cyclobenzaprine (FLEXERIL) 5 MG tablet, Take 1 tablet (5 mg total) by mouth at bedtime. Use as needed for neck pain, Disp: 30 tablet, Rfl: 1 ?  dicyclomine (BENTYL) 20 MG tablet, Take 1 tablet (20 mg total) by mouth every 4 (four) hours., Disp: 120 tablet, Rfl: 3 ?  glimepiride (AMARYL) 4 MG tablet,  TAKE 2 TABLETS EVERY DAY WITH BREAKFAST, Disp: 180 tablet, Rfl: 1 ?  glucose blood (ACCU-CHEK GUIDE) test strip, USE ONE TIME DAILY TO TEST BLOOD SUGAR, Disp: 100 strip, Rfl: 1 ?  INVOKANA 100 MG TABS tablet, TAKE 1 TABLET BY MOUTH ONCE DAILY BEFORE BREAKFAST, Disp: 90 tablet, Rfl: 1 ?  lisinopril (ZESTRIL) 20 MG tablet, Take 1 tablet by mouth once daily, Disp: 90 tablet, Rfl: 0 ?  metFORMIN (GLUCOPHAGE) 1000 MG tablet, TAKE 1 TABLET BY MOUTH TWICE DAILY WITH  MEALS, Disp: 180 tablet, Rfl: 0 ?  pantoprazole (PROTONIX) 40 MG tablet, TAKE 1 TABLET TWICE DAILY, Disp: 180 tablet, Rfl: 1 ?  sucralfate (CARAFATE) 1 GM/10ML suspension, Take 10 mLs (1 g total) by mouth 4 (four) times daily -  with meals and at bedtime., Disp: 420 mL,  Rfl: 3 ?  sulfamethoxazole-trimethoprim (BACTRIM DS) 800-160 MG tablet, Take 1 tablet by mouth 2 (two) times daily., Disp: 14 tablet, Rfl: 0 ?  triamcinolone cream (KENALOG) 0.1 %, Apply 1 application topically 2 (two) times daily. Use as needed on dry or scaly areas, Disp: 60 g, Rfl: 1 ?  acetaminophen (TYLENOL) 500 MG tablet, Take 1,000 mg by mouth every 6 (six) hours as needed (pain). Reported on 04/08/2016 (Patient not taking: Reported on 12/31/2021), Disp: , Rfl:  ?  predniSONE (DELTASONE) 20 MG tablet, Take one tablet by mouth daily for 5 days (Patient not taking: Reported on 12/31/2021), Disp: 5 tablet, Rfl: 0 ?No Known Allergies  ?Social History  ? ?Tobacco Use  ? Smoking status: Every Day  ?  Packs/day: 1.00  ?  Years: 34.00  ?  Pack years: 34.00  ?  Types: Cigarettes  ?  Passive exposure: Current  ? Smokeless tobacco: Never  ? Tobacco comments:  ?  Tobacco info given 10/11/15  ?Substance Use Topics  ? Alcohol use: Not Currently  ?  ?Family History  ?Problem Relation Age of Onset  ? Diabetes Mother   ? Stroke Mother   ? Dementia Mother   ? Heart disease Father   ? Diabetes Father   ? Diabetes Brother   ? Dementia Brother   ? Renal Disease Brother   ? Diabetes Sister   ? Narcolepsy Sister   ? Arthritis Sister   ? Diabetes Sister   ? Obesity Sister   ? Diabetes Brother   ? Heart disease Brother   ? Cancer Brother   ?     pancreatic  ? Diabetes Brother   ? Diabetes Brother   ? Stomach cancer Sister   ? Colon cancer Neg Hx   ? Esophageal cancer Neg Hx   ? Rectal cancer Neg Hx   ?  ? ? ?Review of Systems ? ?Constitutional: negative for fatigue and weight loss ?Respiratory: negative for cough and wheezing ?Cardiovascular: negative for chest pain, fatigue and palpitations ?Gastrointestinal: negative for abdominal pain and change in bowel habits ?Musculoskeletal:negative for myalgias ?Neurological: negative for gait problems and tremors ?Behavioral/Psych: negative for abusive relationship, depression ?Endocrine:  negative for temperature intolerance    ?Genitourinary:negative for abnormal menstrual periods, genital lesions, hot flashes, sexual problems and vaginal discharge ?Integument/breast: negative for breast lump, breast tenderness, nipple discharge and skin lesion(s) ? ?  ?Objective:  ? ?    ?BP (!) 177/80   Pulse (!) 111   Wt 196 lb 8 oz (89.1 kg)   BMI 32.70 kg/m?  ?General:   alert  ?Skin:   no rash or abnormalities  ?Lungs:  clear to auscultation bilaterally  ?Heart:   regular rate and rhythm, S1, S2 normal, no murmur, click, rub or gallop  ?Breasts:   normal without suspicious masses, skin or nipple changes or axillary nodes  ?Abdomen:  normal findings: no organomegaly, soft, non-tender and no hernia  ?Pelvis:  External genitalia: normal general appearance ?Urinary system: urethral meatus normal and bladder without fullness, nontender ?Vaginal: normal without tenderness, induration or masses ?Cervix: normal appearance ?Adnexa: normal bimanual exam ?Uterus: anteverted and non-tender, normal size  ? ?Lab Review ?Urine pregnancy test ?Labs reviewed yes ?Radiologic studies reviewed yes ? ?I have spent a total of 20 minutes of face-to-face time, excluding clinical staff time, reviewing notes and preparing to see patient, ordering tests and/or medications, and counseling the patient.  ? ?Assessment:  ? ? 1. Encounter for routine gynecological examination with Papanicolaou smear of cervix ?2 ? ?2. Uterine prolapse, complete procidentia ?Rx: ?- US PELVIC COMPLETE WITH TRANSVAGINAL; Future ? ?3. Urinary frequency ?Rx: ?- POCT Urinalysis Dipstick ?- Urine Culture ?- sulfamethoxazole-trimethoprim (BACTRIM DS) 800-160 MG tablet; Take 1 tablet by mouth 2 (two) times daily.  Dispense: 14 tablet; Refill: 0 ? 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  Managed by PCP ? ?5. HTN (hypertension), benign ?- managed by PCP ? ?6. Obesity (BMI 30.0-34.9) ?- weight reduction with the aid of dietary changes, exercise  and behavioral modification  ? ?7. Tobacco abuse counseling ?- cessation recommended ?  ?  ?Plan:  ? ? Education reviewed: calcium supplements, depression evaluation, low fat, low cholesterol diet, safe sex/STD preve

## 2021-12-31 NOTE — Addendum Note (Signed)
Addended by: Maryruth Eve on: 12/31/2021 04:49 PM ? ? Modules accepted: Orders ? ?

## 2022-01-02 ENCOUNTER — Other Ambulatory Visit: Payer: Self-pay | Admitting: Obstetrics

## 2022-01-02 DIAGNOSIS — B9689 Other specified bacterial agents as the cause of diseases classified elsewhere: Secondary | ICD-10-CM

## 2022-01-02 DIAGNOSIS — B379 Candidiasis, unspecified: Secondary | ICD-10-CM

## 2022-01-02 LAB — CERVICOVAGINAL ANCILLARY ONLY
Bacterial Vaginitis (gardnerella): POSITIVE — AB
Candida Glabrata: POSITIVE — AB
Candida Vaginitis: NEGATIVE
Chlamydia: NEGATIVE
Comment: NEGATIVE
Comment: NEGATIVE
Comment: NEGATIVE
Comment: NEGATIVE
Comment: NEGATIVE
Comment: NORMAL
Neisseria Gonorrhea: NEGATIVE
Trichomonas: NEGATIVE

## 2022-01-02 MED ORDER — METRONIDAZOLE 500 MG PO TABS: 500.0000 mg | ORAL_TABLET | Freq: Two times a day (BID) | ORAL | 2 refills | Status: DC

## 2022-01-02 MED ORDER — AZO BORIC ACID 600 MG VA SUPP: 1.0000 | Freq: Every day | VAGINAL | 0 refills | Status: DC

## 2022-01-03 LAB — URINE CULTURE

## 2022-01-05 ENCOUNTER — Other Ambulatory Visit: Payer: Medicare HMO

## 2022-01-05 ENCOUNTER — Other Ambulatory Visit: Payer: Self-pay | Admitting: Obstetrics

## 2022-01-05 ENCOUNTER — Telehealth: Payer: Self-pay | Admitting: Emergency Medicine

## 2022-01-05 ENCOUNTER — Ambulatory Visit (INDEPENDENT_AMBULATORY_CARE_PROVIDER_SITE_OTHER): Payer: Medicare HMO | Admitting: Family Medicine

## 2022-01-05 ENCOUNTER — Encounter: Payer: Self-pay | Admitting: Family Medicine

## 2022-01-05 VITALS — BP 140/82 | HR 95 | Temp 98.0°F | Resp 18 | Ht 65.0 in | Wt 196.8 lb

## 2022-01-05 DIAGNOSIS — Z1329 Encounter for screening for other suspected endocrine disorder: Secondary | ICD-10-CM

## 2022-01-05 DIAGNOSIS — E1140 Type 2 diabetes mellitus with diabetic neuropathy, unspecified: Secondary | ICD-10-CM

## 2022-01-05 DIAGNOSIS — Z23 Encounter for immunization: Secondary | ICD-10-CM

## 2022-01-05 DIAGNOSIS — D562 Delta-beta thalassemia: Secondary | ICD-10-CM

## 2022-01-05 DIAGNOSIS — M810 Age-related osteoporosis without current pathological fracture: Secondary | ICD-10-CM | POA: Diagnosis not present

## 2022-01-05 DIAGNOSIS — L989 Disorder of the skin and subcutaneous tissue, unspecified: Secondary | ICD-10-CM

## 2022-01-05 DIAGNOSIS — I10 Essential (primary) hypertension: Secondary | ICD-10-CM | POA: Diagnosis not present

## 2022-01-05 LAB — COMPREHENSIVE METABOLIC PANEL
ALT: 28 U/L (ref 0–35)
AST: 16 U/L (ref 0–37)
Albumin: 4 g/dL (ref 3.5–5.2)
Alkaline Phosphatase: 118 U/L — ABNORMAL HIGH (ref 39–117)
BUN: 9 mg/dL (ref 6–23)
CO2: 24 mEq/L (ref 19–32)
Calcium: 9.3 mg/dL (ref 8.4–10.5)
Chloride: 102 mEq/L (ref 96–112)
Creatinine, Ser: 0.79 mg/dL (ref 0.40–1.20)
GFR: 77.87 mL/min (ref >60.00)
Glucose, Bld: 143 mg/dL — ABNORMAL HIGH (ref 70–99)
Potassium: 4.2 mEq/L (ref 3.5–5.1)
Sodium: 135 mEq/L (ref 135–145)
Total Bilirubin: 0.3 mg/dL (ref 0.2–1.2)
Total Protein: 7 g/dL (ref 6.0–8.3)

## 2022-01-05 LAB — TSH: TSH: 1.04 u[IU]/mL (ref 0.35–5.50)

## 2022-01-05 LAB — CYTOLOGY - PAP
Comment: NEGATIVE
Diagnosis: NEGATIVE
High risk HPV: NEGATIVE

## 2022-01-05 MED ORDER — ALENDRONATE SODIUM 70 MG PO TABS: 70.0000 mg | ORAL_TABLET | ORAL | 3 refills | Status: DC

## 2022-01-05 NOTE — Telephone Encounter (Signed)
TC to patient to inform of results and Rx.  ?

## 2022-01-06 ENCOUNTER — Encounter: Payer: Self-pay | Admitting: Family Medicine

## 2022-01-06 LAB — HEMOGLOBIN A1C
Hgb A1c MFr Bld: 7.2 % of total Hgb — ABNORMAL HIGH (ref <5.7)
Mean Plasma Glucose: 160 mg/dL
eAG (mmol/L): 8.9 mmol/L

## 2022-01-08 ENCOUNTER — Ambulatory Visit
Admission: RE | Admit: 2022-01-08 | Discharge: 2022-01-08 | Disposition: A | Discharge status: Home / Self Care | Payer: Medicare HMO | Source: Ambulatory Visit | Attending: Obstetrics | Admitting: Obstetrics

## 2022-01-08 DIAGNOSIS — N814 Uterovaginal prolapse, unspecified: Secondary | ICD-10-CM | POA: Diagnosis not present

## 2022-01-08 DIAGNOSIS — D259 Leiomyoma of uterus, unspecified: Secondary | ICD-10-CM | POA: Diagnosis not present

## 2022-01-09 ENCOUNTER — Other Ambulatory Visit: Payer: Self-pay

## 2022-01-09 ENCOUNTER — Telehealth: Payer: Self-pay | Admitting: Family Medicine

## 2022-01-09 DIAGNOSIS — I10 Essential (primary) hypertension: Secondary | ICD-10-CM

## 2022-01-09 MED ORDER — LISINOPRIL 20 MG PO TABS: 20.0000 mg | ORAL_TABLET | Freq: Every day | ORAL | 0 refills | Status: DC

## 2022-01-09 NOTE — Telephone Encounter (Signed)
Refill sent.

## 2022-01-09 NOTE — Telephone Encounter (Signed)
?  lisinopril (ZESTRIL) 20 MG tablet [[id number]]  ? ? ? ? ?[facility], [place] TO BE  FOR ALLL MEDICATIONS FROM NOW ON ?

## 2022-02-20 ENCOUNTER — Other Ambulatory Visit: Payer: Self-pay

## 2022-02-20 ENCOUNTER — Telehealth: Payer: Self-pay | Admitting: Family Medicine

## 2022-02-20 MED ORDER — CANAGLIFLOZIN 100 MG PO TABS: 100.0000 mg | ORAL_TABLET | Freq: Every day | ORAL | 1 refills | Status: DC

## 2022-02-20 MED ORDER — PANTOPRAZOLE SODIUM 40 MG PO TBEC: 40.0000 mg | DELAYED_RELEASE_TABLET | Freq: Two times a day (BID) | ORAL | 1 refills | Status: DC

## 2022-02-20 MED ORDER — METFORMIN HCL 1000 MG PO TABS: 1000.0000 mg | ORAL_TABLET | Freq: Two times a day (BID) | ORAL | 0 refills | Status: DC

## 2022-02-20 NOTE — Telephone Encounter (Signed)
Refills sent

## 2022-02-20 NOTE — Telephone Encounter (Signed)
Pt requesting rx refills.   Medication:  pantoprazole (PROTONIX) 40 MG tablet INVOKANA 100 MG TABS tablet metFORMIN (GLUCOPHAGE) 1000 MG tablet  Has the patient contacted their pharmacy? No. (If no, request that the patient contact the pharmacy for the refill.) (If yes, when and what did the pharmacy advise?)     Preferred Pharmacy (with phone number or street name):  [facility] [place] [facility] [place] [facility]  [address], [place] [address]  Phone:  [phone]  Fax:  [phone]

## 2022-03-12 ENCOUNTER — Other Ambulatory Visit: Payer: Self-pay

## 2022-03-12 DIAGNOSIS — H35373 Puckering of macula, bilateral: Secondary | ICD-10-CM | POA: Diagnosis not present

## 2022-03-12 DIAGNOSIS — H43813 Vitreous degeneration, bilateral: Secondary | ICD-10-CM | POA: Diagnosis not present

## 2022-03-12 DIAGNOSIS — H35033 Hypertensive retinopathy, bilateral: Secondary | ICD-10-CM | POA: Diagnosis not present

## 2022-03-12 DIAGNOSIS — E113393 Type 2 diabetes mellitus with moderate nonproliferative diabetic retinopathy without macular edema, bilateral: Secondary | ICD-10-CM | POA: Diagnosis not present

## 2022-03-12 MED ORDER — CANAGLIFLOZIN 100 MG PO TABS: 100.0000 mg | ORAL_TABLET | Freq: Every day | ORAL | 1 refills | Status: DC

## 2022-03-30 ENCOUNTER — Ambulatory Visit (HOSPITAL_COMMUNITY)
Admission: EM | Admit: 2022-03-30 | Discharge: 2022-03-30 | Disposition: A | Discharge status: Other Acute Inpatient Hospital | Payer: Medicare HMO

## 2022-03-30 ENCOUNTER — Encounter (HOSPITAL_COMMUNITY): Payer: Self-pay

## 2022-03-30 ENCOUNTER — Emergency Department (HOSPITAL_COMMUNITY): Payer: Medicare HMO

## 2022-03-30 ENCOUNTER — Emergency Department (HOSPITAL_COMMUNITY)
Admission: EM | Admit: 2022-03-30 | Discharge: 2022-03-31 | Disposition: A | Discharge status: Home / Self Care | Payer: Medicare HMO | Attending: Emergency Medicine | Admitting: Emergency Medicine

## 2022-03-30 ENCOUNTER — Other Ambulatory Visit: Payer: Self-pay

## 2022-03-30 DIAGNOSIS — Z79899 Other long term (current) drug therapy: Secondary | ICD-10-CM | POA: Insufficient documentation

## 2022-03-30 DIAGNOSIS — R519 Headache, unspecified: Secondary | ICD-10-CM

## 2022-03-30 DIAGNOSIS — I10 Essential (primary) hypertension: Secondary | ICD-10-CM

## 2022-03-30 DIAGNOSIS — J449 Chronic obstructive pulmonary disease, unspecified: Secondary | ICD-10-CM | POA: Insufficient documentation

## 2022-03-30 DIAGNOSIS — Z7984 Long term (current) use of oral hypoglycemic drugs: Secondary | ICD-10-CM | POA: Diagnosis not present

## 2022-03-30 DIAGNOSIS — E119 Type 2 diabetes mellitus without complications: Secondary | ICD-10-CM | POA: Diagnosis not present

## 2022-03-30 LAB — CBC WITH DIFFERENTIAL/PLATELET
Abs Immature Granulocytes: 0.04 10*3/uL (ref 0.00–0.07)
Basophils Absolute: 0.1 10*3/uL (ref 0.0–0.1)
Basophils Relative: 1 %
Eosinophils Absolute: 0.2 10*3/uL (ref 0.0–0.5)
Eosinophils Relative: 2 %
HCT: 38.4 % (ref 36.0–46.0)
Hemoglobin: 11.8 g/dL — ABNORMAL LOW (ref 12.0–15.0)
Immature Granulocytes: 0 %
Lymphocytes Relative: 41 %
Lymphs Abs: 4.3 10*3/uL — ABNORMAL HIGH (ref 0.7–4.0)
MCH: 22.1 pg — ABNORMAL LOW (ref 26.0–34.0)
MCHC: 30.7 g/dL (ref 30.0–36.0)
MCV: 71.9 fL — ABNORMAL LOW (ref 80.0–100.0)
Monocytes Absolute: 0.6 10*3/uL (ref 0.1–1.0)
Monocytes Relative: 6 %
Neutro Abs: 5.2 10*3/uL (ref 1.7–7.7)
Neutrophils Relative %: 50 %
Platelets: 308 10*3/uL (ref 150–400)
RBC: 5.34 MIL/uL — ABNORMAL HIGH (ref 3.87–5.11)
RDW: 17.7 % — ABNORMAL HIGH (ref 11.5–15.5)
WBC: 10.4 10*3/uL (ref 4.0–10.5)
nRBC: 0 % (ref 0.0–0.2)

## 2022-03-30 LAB — COMPREHENSIVE METABOLIC PANEL
ALT: 19 U/L (ref 0–44)
AST: 15 U/L (ref 15–41)
Albumin: 3.4 g/dL — ABNORMAL LOW (ref 3.5–5.0)
Alkaline Phosphatase: 89 U/L (ref 38–126)
Anion gap: 9 (ref 5–15)
BUN: 12 mg/dL (ref 8–23)
CO2: 22 mmol/L (ref 22–32)
Calcium: 9.4 mg/dL (ref 8.9–10.3)
Chloride: 105 mmol/L (ref 98–111)
Creatinine, Ser: 0.58 mg/dL (ref 0.44–1.00)
GFR, Estimated: >60 mL/min (ref >60)
Glucose, Bld: 133 mg/dL — ABNORMAL HIGH (ref 70–99)
Potassium: 3.8 mmol/L (ref 3.5–5.1)
Sodium: 136 mmol/L (ref 135–145)
Total Bilirubin: 0.4 mg/dL (ref 0.3–1.2)
Total Protein: 6.6 g/dL (ref 6.5–8.1)

## 2022-03-30 NOTE — ED Provider Triage Note (Signed)
Emergency Medicine Provider Triage Evaluation Note  Gerre Ranum , a 66 y.o. female  was evaluated in triage.  Pt complains of headache for three weeks.  She was seen at urgent care and sent here for a CT scan on her head.  She denies any weakness or dizziness. Her pain is left sided and now into her left eye  She reports medical compliance.   Physical Exam  BP (!) 175/76 (BP Location: Right Arm)   Pulse 93   Temp 98.4 F (36.9 C) (Oral)   Resp 15   SpO2 98%  Gen:   Awake, no distress   Resp:  Normal effort  MSK:   Moves extremities without difficulty  Other:  Normal speech.  Moves with out difficulty.   Medical Decision Making  Medically screening exam initiated at 7:05 PM.  Appropriate orders placed.  Sherri Rad was informed that the remainder of the evaluation will be completed by another provider, this initial triage assessment does not replace that evaluation, and the importance of remaining in the ED until their evaluation is complete.     Lorin Glass, [place] 03/30/22 Einar Crow

## 2022-03-30 NOTE — ED Triage Notes (Signed)
Pt reports headache to left side of her head x 3 weeks which now has moved to her left eye. She was sent from urgent care. Denies weakness/tingling/numbness

## 2022-03-30 NOTE — Discharge Instructions (Addendum)
Due to the severity of your headache in combination with your diabetes, uncontrolled HTN and smoking hx, we really need you to be seen in the ER for a further workup, including a CT Scan. We cannot safely treat you outpatient not knowing the cause of this severe pain. Please head straight to the ER for a further workup.

## 2022-03-30 NOTE — ED Triage Notes (Signed)
Pt presents with c/o HA x 3 weeks and eye pain.

## 2022-03-30 NOTE — ED Provider Notes (Signed)
Bailey [name]    CSN: [id number] Arrival date & time: 03/30/22  1649      History   Chief Complaint Chief Complaint  Patient presents with   Headache   Eye Pain    HPI Bailey [name] is a 66 y.o. female.   Pleasant 66 year old female presents today due to a 3-week history of left sided headache.  She states its been in the parietal region.  She reports that it is sharp in nature, currently an 8 out of 10.  She states over the past 3 weeks, she has been having other issues as well but has been "distracting her from her headache", but states over the past 24 hours, the pain is now also affecting her left eye.  She has been trying numerous over-the-counter medications and cannot get it to go away.  She denies any trauma to her head.  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  She has a history of hypertension and diabetes, she also smokes.  She states she has not been taking care of herself recently.  She denies a history of headaches or migraines and states this is out of her normal.  She admits to a change in vision, but states "likely due to needing new glasses". Denies slurred speech, weakness of an extremity, numbness or tingling. It is not worse with position, and denies any additional URI sx.   Headache Associated symptoms: eye pain   Eye Pain Associated symptoms include headaches.    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   Diabetic retinopathy ([place]) 02/20/2017   Fatty liver 11/24/2016   GERD  (gastroesophageal reflux disease) 07/26/2015   Hyperlipidemia 07/26/2015   Dyspepsia 07/26/2015   Essential hypertension 07/26/2015   Fibrocystic breast disease 03/22/2015   Type 2 diabetes mellitus treated without insulin ([place]) 02/16/2013    Past Surgical History:  Procedure Laterality Date   BREAST BIOPSY Right 04/19/2015   benign   BREAST BIOPSY Right 11/01/2020   fibroadenoma   CATARACT EXTRACTION     CHOLECYSTECTOMY     COLONOSCOPY     GALLBLADDER SURGERY     UTERINE FIBROID SURGERY      OB History     Gravida  2   Para  2   Term  2   Preterm      AB      Living  2      SAB      IAB      Ectopic      Multiple      Live Births  2            Home Medications    Prior to Admission medications   Medication Sig Start Date End Date Taking? Authorizing Provider  Accu-Chek Softclix Lancets lancets Use to check blood sugar once a day. Dx code: E11.9 10/24/20   Copland, Gay Filler, MD  acetaminophen (TYLENOL) 500 MG tablet Take 1,000 mg by mouth every 6 (six) hours as needed (pain). Reported  on 04/08/2016    [provider]  albuterol (VENTOLIN HFA) 108 (90 Base) MCG/ACT inhaler Inhale 2 puffs into the lungs every 6 (six) hours as needed for wheezing or shortness of breath. 08/21/21   Copland, Gay Filler, MD  alendronate (FOSAMAX) 70 MG tablet Take 1 tablet (70 mg total) by mouth every 7 (seven) days. Take with a full glass of water on an empty stomach. 01/05/22   Copland, Gay Filler, MD  atorvastatin (LIPITOR) 40 MG tablet TAKE 1 TABLET EVERY DAY 12/12/21   Copland, Gay Filler, MD  blood glucose meter kit and supplies KIT Dispense based on patient and insurance preference. Use up to four times daily as directed. (FOR ICD-9 250.00, 250.01). 09/26/15   Jaynee Eagles, PA-C  Blood Glucose Monitoring Suppl (ACCU-CHEK GUIDE ME) w/Device KIT Use to check blood sugar once a day. Dx code: E11.9 10/24/20   Copland, Gay Filler, MD  Boric Acid Vaginal (AZO BORIC ACID) 600 MG  SUPP Place 1 suppository vaginally at bedtime. 01/02/22   Shelly Bombard, MD  canagliflozin (INVOKANA) 100 MG TABS tablet Take 1 tablet (100 mg total) by mouth daily before breakfast. 03/12/22   Copland, Gay Filler, MD  cholestyramine (QUESTRAN) 4 g packet DISSOLVE & TAKE 1 POWDER PACKET BY MOUTH TWICE DAILY 02/19/21   Copland, Gay Filler, MD  cyclobenzaprine (FLEXERIL) 5 MG tablet Take 1 tablet (5 mg total) by mouth at bedtime. Use as needed for neck pain 07/02/21   Copland, Gay Filler, MD  dicyclomine (BENTYL) 20 MG tablet Take 1 tablet (20 mg total) by mouth every 4 (four) hours. 06/04/21   Levin Erp, PA  glimepiride (AMARYL) 4 MG tablet TAKE 2 TABLETS EVERY DAY WITH BREAKFAST 12/12/21   Copland, Gay Filler, MD  glucose blood (ACCU-CHEK GUIDE) test strip USE ONE TIME DAILY TO TEST BLOOD SUGAR 03/28/21   Copland, Gay Filler, MD  lisinopril (ZESTRIL) 20 MG tablet Take 1 tablet (20 mg total) by mouth daily. 01/09/22   Copland, Gay Filler, MD  metFORMIN (GLUCOPHAGE) 1000 MG tablet Take 1 tablet (1,000 mg total) by mouth 2 (two) times daily with a meal. 02/20/22   Copland, Gay Filler, MD  pantoprazole (PROTONIX) 40 MG tablet Take 1 tablet (40 mg total) by mouth 2 (two) times daily. 02/20/22   Copland, Gay Filler, MD  triamcinolone cream (KENALOG) 0.1 % Apply 1 application topically 2 (two) times daily. Use as needed on dry or scaly areas 03/30/19   Copland, Gay Filler, MD    Family History Family History  Problem Relation Age of Onset   Diabetes Mother    Stroke Mother    Dementia Mother    Heart disease Father    Diabetes Father    Diabetes Brother    Dementia Brother    Renal Disease Brother    Diabetes Sister    Narcolepsy Sister    Arthritis Sister    Diabetes Sister    Obesity Sister    Diabetes Brother    Heart disease Brother    Cancer Brother        pancreatic   Diabetes Brother    Diabetes Brother    Stomach cancer Sister    Colon cancer Neg Hx    Esophageal cancer Neg Hx     Rectal cancer Neg Hx     Social History Social History   Tobacco Use   Smoking status: Every Day    Packs/day: 1.00    Years: 34.00  Total pack years: 34.00    Types: Cigarettes    Passive exposure: Current   Smokeless tobacco: Never   Tobacco comments:    Tobacco info given 10/11/15  Vaping Use   Vaping Use: Never used  Substance Use Topics   Alcohol use: Not Currently   Drug use: No     Allergies   Patient has no known allergies.   Review of Systems Review of Systems  Eyes:  Positive for pain.  Neurological:  Positive for headaches.     Physical Exam Triage Vital Signs ED Triage Vitals  Enc Vitals Group     BP 03/30/22 1729 (!) 177/70     Pulse Rate 03/30/22 1729 96     Resp 03/30/22 1729 14     Temp 03/30/22 1729 98 F (36.7 C)     Temp Source 03/30/22 1729 Oral     SpO2 03/30/22 1729 99 %     Weight --      Height --      Head Circumference --      Peak Flow --      Pain Score 03/30/22 1728 8     Pain Loc --      Pain Edu? --      Excl. in [place]? --    No data found.  Updated Vital Signs BP (!) 177/70 (BP Location: Left Arm)   Pulse 96   Temp 98 F (36.7 C) (Oral)   Resp 14   SpO2 99%   Visual Acuity Right Eye Distance:   Left Eye Distance:   Bilateral Distance:    Right Eye Near:   Left Eye Near:    Bilateral Near:     Physical Exam Vitals and nursing note reviewed.  Constitutional:      General: She is in acute distress (pt frequently wincing in pain).     Appearance: She is well-developed. She is obese. She is not ill-appearing, toxic-appearing or diaphoretic.  HENT:     Head: Normocephalic and atraumatic.     Mouth/Throat:     Mouth: Mucous membranes are moist.  Eyes:     General: No visual field deficit or scleral icterus.    Extraocular Movements: Extraocular movements intact.     Pupils: Pupils are equal, round, and reactive to light.  Cardiovascular:     Rate and Rhythm: Normal rate and regular rhythm.     Heart  sounds: Normal heart sounds. No murmur heard. Pulmonary:     Effort: Pulmonary effort is normal. No respiratory distress.     Breath sounds: Normal breath sounds.  Abdominal:     Palpations: Abdomen is soft.  Neurological:     Mental Status: She is alert and oriented to person, place, and time. Mental status is at baseline.     Cranial Nerves: No cranial nerve deficit, dysarthria or facial asymmetry.     Motor: No weakness.     Coordination: Romberg sign negative (negative, however pt unstable).      UC Treatments / Results  Labs (all labs ordered are listed, but only abnormal results are displayed) Labs Reviewed - No data to display  EKG   Radiology No results found.  Procedures Procedures (including critical care time)  Medications Ordered in UC Medications - No data to display  Initial Impression / Assessment and Plan / UC Course  I have reviewed the triage vital signs and the nursing notes.  Pertinent labs & imaging results that were available during my care  of the patient were reviewed by me and considered in my medical decision making (see chart for details).     Acute intractable headache - pt complaining of severe, intractable headache to left parietal region.  Patient has significant risk factors including smoking, diabetes, uncontrolled hypertension.  Given "borderline worst headache of life" now also involving L eye, further evaluation in the ER is recommended.  CT scan necessary. Hypertension -uncontrolled in office, even with recheck. Head to ER for further workup   Final Clinical Impressions(s) / UC Diagnoses   Final diagnoses:  Acute intractable headache, unspecified headache type  Primary hypertension     Discharge Instructions      Due to the severity of your headache in combination with your diabetes, uncontrolled HTN and smoking hx, we really need you to be seen in the ER for a further workup, including a CT Scan. We cannot safely treat you  outpatient not knowing the cause of this severe pain. Please head straight to the ER for a further workup.     ED Prescriptions   None    PDMP not reviewed this encounter.   Chaney Malling, [place] 03/30/22 [phone]

## 2022-03-30 NOTE — ED Notes (Signed)
Patient is being discharged from the Urgent Care and sent to the Emergency Department via POV . Per Geryl Councilman, patient is in need of higher level of care due to HA. Patient is aware and verbalizes understanding of plan of care.  Vitals:   03/30/22 1729  BP: (!) 177/70  Pulse: 96  Resp: 14  Temp: 98 F (36.7 C)  SpO2: 99%

## 2022-03-31 DIAGNOSIS — R519 Headache, unspecified: Secondary | ICD-10-CM | POA: Diagnosis not present

## 2022-03-31 LAB — SEDIMENTATION RATE: Sed Rate: 16 mm/hr (ref 0–22)

## 2022-03-31 MED ORDER — PROCHLORPERAZINE EDISYLATE 10 MG/2ML IJ SOLN
10.0000 mg | Freq: Once | INTRAMUSCULAR | Status: AC
  Administered 2022-03-31: 10 mg via INTRAVENOUS
  Filled 2022-03-31: qty 2

## 2022-03-31 MED ORDER — DIPHENHYDRAMINE HCL 50 MG/ML IJ SOLN
25.0000 mg | Freq: Once | INTRAMUSCULAR | Status: AC
  Administered 2022-03-31: 25 mg via INTRAVENOUS
  Filled 2022-03-31: qty 1

## 2022-03-31 NOTE — Discharge Instructions (Signed)
You were seen today for a headache.  You were treated for migraine cocktail.  I placed a referral for neurology as an outpatient.  If you have recurrence of symptoms, you should follow-up.

## 2022-03-31 NOTE — ED Provider Notes (Signed)
[facility] EMERGENCY DEPARTMENT Provider Note   CSN: [id number] Arrival date & time: 03/30/22  1826     History  Chief Complaint  Patient presents with   Headache    Lyndsee Casa is a 66 y.o. female.  HPI     This is a 66 year old female who presents with headache.  No known history of headaches or migraines.  States that she has had a persistent constant headache over the left side of her head over the last 3 weeks.  She states over the last 24 to 48 hours that has moved into her right eye.  She denies any vision changes but does report pain in the eye.  She has taken over-the-counter pain medications with no relief.  Denies weakness, numbness, speech difficulty, strokelike symptoms.  No recent fevers or upper respiratory symptoms.  Denies photosensitivity.  Home Medications Prior to Admission medications   Medication Sig Start Date End Date Taking? Authorizing Provider  Accu-Chek Softclix Lancets lancets Use to check blood sugar once a day. Dx code: E11.9 10/24/20   Copland, Gay Filler, MD  acetaminophen (TYLENOL) 500 MG tablet Take 1,000 mg by mouth every 6 (six) hours as needed (pain). Reported on 04/08/2016    [provider]  albuterol (VENTOLIN HFA) 108 (90 Base) MCG/ACT inhaler Inhale 2 puffs into the lungs every 6 (six) hours as needed for wheezing or shortness of breath. 08/21/21   Copland, Gay Filler, MD  alendronate (FOSAMAX) 70 MG tablet Take 1 tablet (70 mg total) by mouth every 7 (seven) days. Take with a full glass of water on an empty stomach. 01/05/22   Copland, Gay Filler, MD  atorvastatin (LIPITOR) 40 MG tablet TAKE 1 TABLET EVERY DAY 12/12/21   Copland, Gay Filler, MD  blood glucose meter kit and supplies KIT Dispense based on patient and insurance preference. Use up to four times daily as directed. (FOR ICD-9 250.00, 250.01). 09/26/15   Jaynee Eagles, PA-C  Blood Glucose Monitoring Suppl (ACCU-CHEK GUIDE ME) w/Device KIT Use to check blood  sugar once a day. Dx code: E11.9 10/24/20   Copland, Gay Filler, MD  Boric Acid Vaginal (AZO BORIC ACID) 600 MG SUPP Place 1 suppository vaginally at bedtime. 01/02/22   Shelly Bombard, MD  canagliflozin (INVOKANA) 100 MG TABS tablet Take 1 tablet (100 mg total) by mouth daily before breakfast. 03/12/22   Copland, Gay Filler, MD  cholestyramine (QUESTRAN) 4 g packet DISSOLVE & TAKE 1 POWDER PACKET BY MOUTH TWICE DAILY 02/19/21   Copland, Gay Filler, MD  cyclobenzaprine (FLEXERIL) 5 MG tablet Take 1 tablet (5 mg total) by mouth at bedtime. Use as needed for neck pain 07/02/21   Copland, Gay Filler, MD  dicyclomine (BENTYL) 20 MG tablet Take 1 tablet (20 mg total) by mouth every 4 (four) hours. 06/04/21   Levin Erp, PA  glimepiride (AMARYL) 4 MG tablet TAKE 2 TABLETS EVERY DAY WITH BREAKFAST 12/12/21   Copland, Gay Filler, MD  glucose blood (ACCU-CHEK GUIDE) test strip USE ONE TIME DAILY TO TEST BLOOD SUGAR 03/28/21   Copland, Gay Filler, MD  lisinopril (ZESTRIL) 20 MG tablet Take 1 tablet (20 mg total) by mouth daily. 01/09/22   Copland, Gay Filler, MD  metFORMIN (GLUCOPHAGE) 1000 MG tablet Take 1 tablet (1,000 mg total) by mouth 2 (two) times daily with a meal. 02/20/22   Copland, Gay Filler, MD  pantoprazole (PROTONIX) 40 MG tablet Take 1 tablet (40 mg total) by mouth 2 (two)  times daily. 02/20/22   Copland, Gay Filler, MD  triamcinolone cream (KENALOG) 0.1 % Apply 1 application topically 2 (two) times daily. Use as needed on dry or scaly areas 03/30/19   Copland, Gay Filler, MD      Allergies    Patient has no known allergies.    Review of Systems   Review of Systems  Constitutional:  Negative for fever.  Neurological:  Positive for headaches. Negative for dizziness and speech difficulty.  All other systems reviewed and are negative.   Physical Exam Updated Vital Signs BP (!) 174/72   Pulse 84   Temp 98.4 F (36.9 C) (Oral)   Resp 18   Ht 1.664 m (5' 5.5")   Wt 88.5 kg   SpO2 98%   BMI 31.96  kg/m  Physical Exam Vitals and nursing note reviewed.  Constitutional:      Appearance: She is well-developed. She is obese. She is not ill-appearing.  HENT:     Head: Normocephalic and atraumatic.     Mouth/Throat:     Mouth: Mucous membranes are moist.  Eyes:     Extraocular Movements: Extraocular movements intact.     Pupils: Pupils are equal, round, and reactive to light.     Comments: No temporal artery tenderness to palpation  Cardiovascular:     Rate and Rhythm: Normal rate and regular rhythm.     Heart sounds: Normal heart sounds.  Pulmonary:     Effort: Pulmonary effort is normal. No respiratory distress.     Breath sounds: No wheezing.  Abdominal:     Palpations: Abdomen is soft.  Musculoskeletal:     Cervical back: Neck supple.  Skin:    General: Skin is warm and dry.  Neurological:     Mental Status: She is alert and oriented to person, place, and time.     Comments: Cranial nerves II through XII intact, 5 out of 5 strength in all 4 extremities, no dysmetria to finger-nose-finger  Psychiatric:        Mood and Affect: Mood normal.     ED Results / Procedures / Treatments   Labs (all labs ordered are listed, but only abnormal results are displayed) Labs Reviewed  COMPREHENSIVE METABOLIC PANEL - Abnormal; Notable for the following components:      Result Value   Glucose, Bld 133 (*)    Albumin 3.4 (*)    All other components within normal limits  CBC WITH DIFFERENTIAL/PLATELET - Abnormal; Notable for the following components:   RBC 5.34 (*)    Hemoglobin 11.8 (*)    MCV 71.9 (*)    MCH 22.1 (*)    RDW 17.7 (*)    Lymphs Abs 4.3 (*)    All other components within normal limits  SEDIMENTATION RATE    EKG None  Radiology CT Head Wo Contrast  Result Date: 03/30/2022 CLINICAL DATA:  Headache EXAM: CT HEAD WITHOUT CONTRAST TECHNIQUE: Contiguous axial images were obtained from the base of the skull through the vertex without intravenous contrast.  RADIATION DOSE REDUCTION: This exam was performed according to the departmental dose-optimization program which includes automated exposure control, adjustment of the mA and/or kV according to patient size and/or use of iterative reconstruction technique. COMPARISON:  09/26/2013 FINDINGS: Brain: No acute intracranial abnormality. Specifically, no hemorrhage, hydrocephalus, mass lesion, acute infarction, or significant intracranial injury. Vascular: No hyperdense vessel or unexpected calcification. Skull: No acute calvarial abnormality. Sinuses/Orbits: No acute findings Other: None IMPRESSION: No acute intracranial abnormality. Electronically  Signed   By: Rolm Baptise M.D.   On: 03/30/2022 20:43    Procedures Procedures    Medications Ordered in ED Medications  prochlorperazine (COMPAZINE) injection 10 mg (10 mg Intravenous Given 03/31/22 0301)  diphenhydrAMINE (BENADRYL) injection 25 mg (25 mg Intravenous Given 03/31/22 0300)    ED Course/ Medical Decision Making/ A&P                           Medical Decision Making Amount and/or Complexity of Data Reviewed Labs: ordered.  Risk Prescription drug management.   This patient presents to the ED for concern of headache, this involves an extensive number of treatment options, and is a complaint that carries with it a high risk of complications and morbidity.  I considered the following differential and admission for this acute, potentially life threatening condition.  The differential diagnosis includes new onset migraine, tension headache, cluster headache, less likely subarachnoid hemorrhage, giant cell arteritis  MDM:    This is a 66 year old female who presents with 3-week history of headache.  She is nontoxic and vital signs are notable for blood pressure 175/76.  She is nonfocal on exam.  She does not have any tenderness over the temporal artery although given location of pain, giant cell arteritis would be a consideration.  No history of  migraines but this would also be a consideration.  CT scan obtained and shows no evidence of acute bleed.  While she has had headache for 3 weeks, have low suspicion for subarachnoid hemorrhage.  Patient was given a migraine cocktail.  Labs obtained and largely reassuring including sed rate.  On recheck, patient improved after migraine cocktail.  She states she no longer has a headache.  Will place an ambulatory referral to neurology.  (Labs, imaging, consults)  Labs: I Ordered, and personally interpreted labs.  The pertinent results include: CBC, BMP, sedimentation rate  Imaging Studies ordered: I ordered imaging studies including CT head I independently visualized and interpreted imaging. I agree with the radiologist interpretation  Additional history obtained from chart review.  External records from outside source obtained and reviewed including urgent care notes  Cardiac Monitoring: The patient was maintained on a cardiac monitor.  I personally viewed and interpreted the cardiac monitored which showed an underlying rhythm of: Normal sinus rhythm  Reevaluation: After the interventions noted above, I reevaluated the patient and found that they have :resolved  Social Determinants of Health: Lives independent  Disposition: Discharge  Co morbidities that complicate the patient evaluation  Past Medical History:  Diagnosis Date   Arthritis    Cataract    COPD (chronic obstructive pulmonary disease) ([place])    Diabetes mellitus without complication ([place])    Fibrocystic breast disease July 2016   GERD (gastroesophageal reflux disease)    Hyperlipidemia    Hypertension      Medicines Meds ordered this encounter  Medications   prochlorperazine (COMPAZINE) injection 10 mg   diphenhydrAMINE (BENADRYL) injection 25 mg    I have reviewed the patients home medicines and have made adjustments as needed  Problem List / ED Course: Problem List Items Addressed This Visit   None Visit  Diagnoses     Bad headache    -  Primary                   Final Clinical Impression(s) / ED Diagnoses Final diagnoses:  Bad headache    Rx / DC Orders ED Discharge  Orders          Ordered    Ambulatory referral to Neurology       Comments: An appointment is requested in approximately: 2 weeks   03/31/22 0424              Skylinn Vialpando, Barbette Hair, MD 03/31/22 [phone]

## 2022-04-06 ENCOUNTER — Other Ambulatory Visit: Payer: Self-pay | Admitting: Family Medicine

## 2022-04-06 DIAGNOSIS — I10 Essential (primary) hypertension: Secondary | ICD-10-CM

## 2022-04-10 ENCOUNTER — Telehealth: Payer: Self-pay | Admitting: Emergency Medicine

## 2022-04-10 NOTE — Telephone Encounter (Signed)
RC to patient. Patient requesting address for UroGYN apt next week. Address and name of provider given.  Barkley Boards, RN

## 2022-04-11 DIAGNOSIS — H524 Presbyopia: Secondary | ICD-10-CM | POA: Diagnosis not present

## 2022-04-14 NOTE — Progress Notes (Unsigned)
[place] at [facility] [address], [place], [place] [address] [phone]  Date:  04/16/2022   Name:  Bailey [name]   DOB:  April 10, 1956   MRN:  [id number]  PCP:  Darreld Mclean, MD    Chief Complaint: No chief complaint on file.   History of Present Illness:  Bailey [name] is a 66 y.o. 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-they live together in a small apartment We noted a concerning skin lesion on her back, I made a referral to dermatology at that time  She was seen in the emergency room on July 10 with headache she had a CT scan which looked okay, treated with migraine cocktail-improved, released to home  Shingrix COVID booster Can update A1c if she would like Lab Results  Component Value Date   HGBA1C 7.2 (H) 01/05/2022     Patient Active Problem List   Diagnosis Date Noted   Osteoporosis 10/16/2021   Type 2 diabetes mellitus with diabetic neuropathy, unspecified ([place]) 07/02/2021   Delta beta thalassemia ([place]) 11/22/2019   Diabetic retinopathy ([place]) 02/20/2017   Fatty liver 11/24/2016   GERD (gastroesophageal reflux disease) 07/26/2015   Hyperlipidemia 07/26/2015   Dyspepsia 07/26/2015   Essential hypertension 07/26/2015   Fibrocystic breast disease 03/22/2015   Type 2 diabetes mellitus treated without insulin ([place]) 02/16/2013    Past Medical History:  Diagnosis Date   Arthritis    Cataract    COPD (chronic obstructive pulmonary disease) ([place])    Diabetes mellitus without complication ([place])    Fibrocystic breast disease July 2016   GERD (gastroesophageal reflux disease)    Hyperlipidemia    Hypertension     Past  Surgical History:  Procedure Laterality Date   BREAST BIOPSY Right 04/19/2015   benign   BREAST BIOPSY Right 11/01/2020   fibroadenoma   CATARACT EXTRACTION     CHOLECYSTECTOMY     COLONOSCOPY     GALLBLADDER SURGERY     UTERINE FIBROID SURGERY      Social History   Tobacco Use   Smoking status: Every Day    Packs/day: 1.00    Years: 34.00    Total pack years: 34.00    Types: Cigarettes    Passive exposure: Current   Smokeless tobacco: Never   Tobacco comments:    Tobacco info given 10/11/15  Vaping Use   Vaping Use: Never used  Substance Use Topics   Alcohol use: Not Currently   Drug use: No    Family History  Problem Relation Age of Onset   Diabetes Mother    Stroke Mother    Dementia Mother    Heart disease Father    Diabetes Father    Diabetes Brother    Dementia Brother    Renal Disease Brother    Diabetes Sister    Narcolepsy Sister    Arthritis Sister    Diabetes Sister    Obesity Sister    Diabetes Brother    Heart disease Brother    Cancer Brother        pancreatic   Diabetes Brother    Diabetes Brother    Stomach cancer Sister    Colon cancer Neg  Hx    Esophageal cancer Neg Hx    Rectal cancer Neg Hx     No Known Allergies  Medication list has been reviewed and updated.  Current Outpatient Medications on File Prior to Visit  Medication Sig Dispense Refill   Accu-Chek Softclix Lancets lancets Use to check blood sugar once a day. Dx code: E11.9 100 each 1   acetaminophen (TYLENOL) 500 MG tablet Take 1,000 mg by mouth every 6 (six) hours as needed (pain). Reported on 04/08/2016     albuterol (VENTOLIN HFA) 108 (90 Base) MCG/ACT inhaler Inhale 2 puffs into the lungs every 6 (six) hours as needed for wheezing or shortness of breath. 1 each 3   alendronate (FOSAMAX) 70 MG tablet Take 1 tablet (70 mg total) by mouth every 7 (seven) days. Take with a full glass of water on an empty stomach. 12 tablet 3   atorvastatin (LIPITOR) 40 MG tablet TAKE 1  TABLET EVERY DAY 90 tablet 1   blood glucose meter kit and supplies KIT Dispense based on patient and insurance preference. Use up to four times daily as directed. (FOR ICD-9 250.00, 250.01). 1 each 0   Blood Glucose Monitoring Suppl (ACCU-CHEK GUIDE ME) w/Device KIT Use to check blood sugar once a day. Dx code: E11.9 1 kit 0   Boric Acid Vaginal (AZO BORIC ACID) 600 MG SUPP Place 1 suppository vaginally at bedtime. 14 suppository 0   canagliflozin (INVOKANA) 100 MG TABS tablet Take 1 tablet (100 mg total) by mouth daily before breakfast. 90 tablet 1   cholestyramine (QUESTRAN) 4 g packet DISSOLVE & TAKE 1 POWDER PACKET BY MOUTH TWICE DAILY 180 packet 3   cyclobenzaprine (FLEXERIL) 5 MG tablet Take 1 tablet (5 mg total) by mouth at bedtime. Use as needed for neck pain 30 tablet 1   dicyclomine (BENTYL) 20 MG tablet Take 1 tablet (20 mg total) by mouth every 4 (four) hours. 120 tablet 3   glimepiride (AMARYL) 4 MG tablet TAKE 2 TABLETS EVERY DAY WITH BREAKFAST 180 tablet 1   glucose blood (ACCU-CHEK GUIDE) test strip USE ONE TIME DAILY TO TEST BLOOD SUGAR 100 strip 1   lisinopril (ZESTRIL) 20 MG tablet TAKE 1 TABLET(20 MG) BY MOUTH DAILY 90 tablet 0   metFORMIN (GLUCOPHAGE) 1000 MG tablet Take 1 tablet (1,000 mg total) by mouth 2 (two) times daily with a meal. 180 tablet 0   pantoprazole (PROTONIX) 40 MG tablet Take 1 tablet (40 mg total) by mouth 2 (two) times daily. 180 tablet 1   triamcinolone cream (KENALOG) 0.1 % Apply 1 application topically 2 (two) times daily. Use as needed on dry or scaly areas 60 g 1   No current facility-administered medications on file prior to visit.    Review of Systems:  As per HPI- otherwise negative.   Physical Examination: There were no vitals filed for this visit. There were no vitals filed for this visit. There is no height or weight on file to calculate BMI. Ideal Body Weight:    GEN: no acute distress. HEENT: Atraumatic, Normocephalic.  Ears and  Nose: No external deformity. CV: RRR, No M/G/R. No JVD. No thrill. No extra heart sounds. PULM: CTA B, no wheezes, crackles, rhonchi. No retractions. No resp. distress. No accessory muscle use. ABD: S, NT, ND, +BS. No rebound. No HSM. EXTR: No c/c/e PSYCH: Normally interactive. Conversant. '  Assessment and Plan: ***  Signed Lamar Blinks, MD

## 2022-04-16 ENCOUNTER — Other Ambulatory Visit: Payer: Self-pay

## 2022-04-16 ENCOUNTER — Encounter: Payer: Self-pay | Admitting: Physician Assistant

## 2022-04-16 ENCOUNTER — Ambulatory Visit (INDEPENDENT_AMBULATORY_CARE_PROVIDER_SITE_OTHER): Payer: Medicare HMO | Admitting: Family Medicine

## 2022-04-16 VITALS — BP 152/82 | HR 92 | Temp 97.8°F | Resp 18 | Ht 65.0 in | Wt 196.8 lb

## 2022-04-16 DIAGNOSIS — M810 Age-related osteoporosis without current pathological fracture: Secondary | ICD-10-CM

## 2022-04-16 DIAGNOSIS — E1140 Type 2 diabetes mellitus with diabetic neuropathy, unspecified: Secondary | ICD-10-CM | POA: Diagnosis not present

## 2022-04-16 DIAGNOSIS — G44019 Episodic cluster headache, not intractable: Secondary | ICD-10-CM | POA: Diagnosis not present

## 2022-04-16 DIAGNOSIS — I10 Essential (primary) hypertension: Secondary | ICD-10-CM | POA: Diagnosis not present

## 2022-04-16 MED ORDER — BUTALBITAL-APAP-CAFFEINE 50-325-40 MG PO TABS: 1.0000 | ORAL_TABLET | Freq: Four times a day (QID) | ORAL | 0 refills | Status: DC | PRN

## 2022-04-16 MED ORDER — LISINOPRIL 40 MG PO TABS: 40.0000 mg | ORAL_TABLET | Freq: Every day | ORAL | 3 refills | Status: DC

## 2022-04-16 NOTE — Patient Instructions (Signed)
Good to see you today- I am sorry you are under so much stress. Try the Fioricet medication for your headaches- please let me know how this works for you  I will see you in October

## 2022-04-17 ENCOUNTER — Encounter: Payer: Self-pay | Admitting: Obstetrics and Gynecology

## 2022-04-17 ENCOUNTER — Ambulatory Visit (INDEPENDENT_AMBULATORY_CARE_PROVIDER_SITE_OTHER): Payer: Medicare HMO | Admitting: Obstetrics and Gynecology

## 2022-04-17 ENCOUNTER — Telehealth: Payer: Self-pay

## 2022-04-17 VITALS — BP 138/82 | HR 88

## 2022-04-17 DIAGNOSIS — N8110 Cystocele, unspecified: Secondary | ICD-10-CM

## 2022-04-17 DIAGNOSIS — R350 Frequency of micturition: Secondary | ICD-10-CM | POA: Diagnosis not present

## 2022-04-17 DIAGNOSIS — N813 Complete uterovaginal prolapse: Secondary | ICD-10-CM

## 2022-04-17 DIAGNOSIS — N816 Rectocele: Secondary | ICD-10-CM

## 2022-04-17 LAB — POCT URINALYSIS DIPSTICK
Bilirubin, UA: NEGATIVE
Blood, UA: NEGATIVE
Glucose, UA: POSITIVE — AB
Ketones, UA: NEGATIVE
Leukocytes, UA: NEGATIVE
Nitrite, UA: NEGATIVE
Protein, UA: NEGATIVE
Spec Grav, UA: 1.02 (ref 1.010–1.025)
Urobilinogen, UA: 0.2 E.U./dL
pH, UA: 6 (ref 5.0–8.0)

## 2022-04-17 NOTE — Patient Instructions (Signed)

## 2022-04-17 NOTE — Telephone Encounter (Signed)
Initiated:   Marland [place]: [id number]  Outcome:   "Available without authorization." Per CoverMyMeds

## 2022-04-17 NOTE — Progress Notes (Signed)
[facility] Return Visit  SUBJECTIVE  History of Present Illness: Bailey [name] is a 66 y.o. female seen in follow-up for stage IV prolapse and incontinence. Prior visit she was interested in surgery and plan was for her to undergo urodynamic testing.   Prolapse has been worsening and she has been having more discomfort. Tries to push it back inside but "pops" right back out. She continues to have urine leakage on the way to the bathroom. Feels she sometimes has difficulty emptying the bladder and a weak urine stream. Denies any urinary tract infections. Bowel movements are regular once daily. Not sexually active   Last Hgb A1c on 01/05/22- 7.2%  Smokes daily.   Past Medical History: Patient  has a past medical history of Arthritis, Cataract, COPD (chronic obstructive pulmonary disease) ([place]), Diabetes mellitus without complication ([place]), Fibrocystic breast disease (July 2016), GERD (gastroesophageal reflux disease), Hyperlipidemia, and Hypertension.   Past Surgical History: She  has a past surgical history that includes Cholecystectomy; Uterine fibroid surgery; Cataract extraction; Colonoscopy; Breast biopsy (Right, 04/19/2015); Gallbladder surgery; and Breast biopsy (Right, 11/01/2020).   Medications: She has a current medication list which includes the following prescription(s): acetaminophen, albuterol, alendronate, atorvastatin, blood glucose meter kit and supplies, accu-chek guide me, azo boric acid, butalbital-acetaminophen-caffeine, canagliflozin, cholestyramine, cyclobenzaprine, dicyclomine, glimepiride, accu-chek guide, lisinopril, metformin, pantoprazole, and triamcinolone cream.   Allergies: Patient has no allergies on file.   Social History: Patient  reports that she has been smoking cigarettes. She has a 34.00 pack-year smoking history. She has been exposed to tobacco smoke. She has never used smokeless tobacco. She reports current alcohol use. She reports  that she does not use drugs.      OBJECTIVE     Physical Exam: Vitals:   04/17/22 1145  BP: 138/82  Pulse: 88   Gen: No apparent distress, A&O x 3.  Detailed Urogynecologic Evaluation:   Pelvic Exam: Normal external female genitalia; Bartholin's and Skene's glands normal in appearance; urethral meatus normal in appearance, no urethral masses or discharge.    CST: negative   Complete procedentia noted. Normal vaginal mucosa with atrophy. Cervix normal appearance. Uterus normal single, nontender. Adnexa no mass, fullness, tenderness.     POP-Q  3                                            Aa   7                                           Ba  7                                              C   5.5                                            Gh  4  Pb  7                                            tvl   3                                            Ap  7                                            Bp  -3                                              D      Post-Void Residual (PVR) by Bladder Scan: In order to evaluate bladder emptying, we discussed obtaining a postvoid residual and she agreed to this procedure.   Procedure: The ultrasound unit was placed on the patient's abdomen in the suprapubic region after the patient had voided. A PVR of 89 ml was obtained by bladder scan.   Pelvic US 01/08/22: Uterus Measurements: 7.7 x 3.2 x 4.4 cm = volume: 57.1 mL. Uterus is not distinctly seen in the transabdominal images, possibly related to prolapse. In the transvaginal images there are few focal abnormalities with coarse calcifications. Is 1.2 x 1.2 x 1 cm submucosal fibroid in the posterior body. There is 1.2 x 1.2 x 1.1 cm fibroid in the posterior body. There is 0.5 x 0.4 x 0.3 cm fibroid in the fundus.   Endometrium   Thickness: 1.3 mm.  No focal abnormality visualized.   Right ovary   Not sonographically visualized.    Left ovary   Measurements: 2.4 x 1.1 x 1.7 cm = volume: 2 point mL. Normal appearance/no adnexal mass.    ASSESSMENT AND PLAN    [name] is a 66 y.o. with:  1. Uterovaginal prolapse, complete   2. Prolapse of anterior vaginal wall   3. Prolapse of posterior vaginal wall   4. Urinary frequency    - We discussed options for surgery. Due to her smoking history and DM, she would best be a candidate for vaginal surgery without mesh. She wants the option to be sexually active. Recommended: TVH, BSO, Uterosacral suspension, A&P repair.  - Will have her proceed with urodynamic testing to further assess leakage then will discuss final plan for surgery.   Return for urodynamics  Jaquita Folds, MD  Time spent: I spent 25 minutes dedicated to the care of this patient on the date of this encounter to include pre-visit review of records, face-to-face time with the patient and post visit documentation.

## 2022-04-22 ENCOUNTER — Ambulatory Visit: Admitting: Psychiatry

## 2022-04-22 ENCOUNTER — Encounter: Payer: Self-pay | Admitting: Psychiatry

## 2022-04-23 ENCOUNTER — Ambulatory Visit (INDEPENDENT_AMBULATORY_CARE_PROVIDER_SITE_OTHER): Payer: Medicare HMO | Admitting: Obstetrics and Gynecology

## 2022-04-23 ENCOUNTER — Encounter: Payer: Self-pay | Admitting: Obstetrics and Gynecology

## 2022-04-23 VITALS — BP 172/92 | HR 88

## 2022-04-23 DIAGNOSIS — R339 Retention of urine, unspecified: Secondary | ICD-10-CM | POA: Diagnosis not present

## 2022-04-23 DIAGNOSIS — R350 Frequency of micturition: Secondary | ICD-10-CM

## 2022-04-23 LAB — POCT URINALYSIS DIPSTICK
Bilirubin, UA: NEGATIVE
Blood, UA: NEGATIVE
Glucose, UA: POSITIVE — AB
Ketones, UA: NEGATIVE
Leukocytes, UA: NEGATIVE
Nitrite, UA: NEGATIVE
Protein, UA: NEGATIVE
Spec Grav, UA: 1.02 (ref 1.010–1.025)
Urobilinogen, UA: 0.2 E.U./dL
pH, UA: 5.5 (ref 5.0–8.0)

## 2022-04-23 NOTE — Progress Notes (Signed)
[facility] Urodynamics Procedure  Referring Physician: Darreld Mclean, MD Date of Procedure: 04/23/2022  Bailey [name] is a 66 y.o. female who presents for urodynamic evaluation. Indication(s) for study: mixed incontinence  Vital Signs: BP (!) 172/92   Pulse 88   Laboratory Results: A catheterized urine specimen revealed:  POC urine: positive glucose, otherwise negative  Voiding Diary: Not performed.  Procedure Timeout:  The correct patient was verified and the correct procedure was verified. The patient was in the correct position and safety precautions were reviewed based on at the patient's history.  Urodynamic Procedure A 360F dual lumen urodynamics catheter was placed under sterile conditions into the patient's bladder. A 360F catheter was placed into the rectum in order to measure abdominal pressure. EMG patches were placed in the appropriate position.  All connections were confirmed and calibrations/adjusted made. Saline was instilled into the bladder through the dual lumen catheters.  Cough/valsalva pressures were measured periodically during filling.  Patient was allowed to void.  The bladder was then emptied of its residual.  UROFLOW: Revealed a Qmax of 4.3 mL/sec.  She voided 36 mL and had a residual of 100 mL.  It was a normal pattern and represented normal habits though interpretation limited due to low voided volume.  CMG: This was performed with sterile water in the sitting position at a fill rate of 30 mL/min.    First sensation of fullness was 104 mLs,  First urge was 155 mLs,  Strong urge was 193 mLs and  Capacity was 507 mLs  Stress incontinence was not demonstrated with reduction of prolapse in both sitting and standing positions. Highest negative Barrier CLPP was 123 cmH20 at 310 ml. Highest negative Barrier VLPP was 88 cmH20 at 310 ml in the standing position.   Detrusor function was normal, with no phasic contractions seen.    Compliance:  normal. End fill detrusor pressure was -30cmH20.    UPP: MUCP with barrier reduction was 91 cm of water.    MICTURITION STUDY: Voiding was performed with reduction using scopettes in the sitting position.  Pdet at Qmax was 25.5 cm of water.  Qmax was 5.9 mL/sec.  It was a normal but prolonged pattern.  She voided 97 mL and had a residual of 410 mL.  It was a volitional void, sustained detrusor contraction was present and abdominal straining was not present  EMG: This was performed with patches.  She had voluntary contractions, recruitment with fill was present and urethral sphincter was relaxed with void.  The details of the procedure with the study tracings have been scanned into EPIC.   Urodynamic Impression:  1. Sensation was normal; capacity was normal 2. Stress Incontinence was not demonstrated 3. Detrusor Overactivity was not demonstrated  4. Emptying was dysfunctional with a elevated PVR (480m), a sustained detrusor contraction present,  abdominal straining not present, normal urethral sphincter activity on EMG.  Plan: - The patient will follow up  to discuss the findings and treatment options.

## 2022-04-23 NOTE — Patient Instructions (Signed)
Taking Care of Yourself after Urodynamics, Cystoscopy, Coaptite Injection, or Botox Injection   Drink plenty of water for a day or two following your procedure. Try to have about 8 ounces (one cup) at a time, and do this 6 times or more per day unless you have fluid restrictitons AVOID irritative beverages such as coffee, tea, soda, alcoholic or citrus drinks for a day or two, as this may cause burning with urination.  For the first 1-2 days after the procedure, your urine may be pink or red in color. You may have some blood in your urine as a normal side effect of the procedure. Large amounts of bleeding or difficulty urinating are NOT normal. Call the nurse line if this happens or go to the nearest Emergency Room if the bleeding is heavy or you cannot urinate at all and it is after hours. You may experience some discomfort or a burning sensation with urination after having this procedure. You can use over the counter Azo or pyridium to help with burning and follow the instructions on the packaging. If it does not improve within 1-2 days, or other symptoms appear (fever, chills, or difficulty urinating) call the office to speak to a nurse.  You may return to normal daily activities such as work, school, driving, exercising and housework on the day of the procedure.  

## 2022-05-08 ENCOUNTER — Encounter: Payer: Self-pay | Admitting: Obstetrics and Gynecology

## 2022-05-08 ENCOUNTER — Ambulatory Visit (INDEPENDENT_AMBULATORY_CARE_PROVIDER_SITE_OTHER): Payer: Medicare HMO | Admitting: Obstetrics and Gynecology

## 2022-05-08 VITALS — BP 153/83 | HR 87

## 2022-05-08 DIAGNOSIS — N8110 Cystocele, unspecified: Secondary | ICD-10-CM | POA: Diagnosis not present

## 2022-05-08 DIAGNOSIS — N816 Rectocele: Secondary | ICD-10-CM | POA: Diagnosis not present

## 2022-05-08 DIAGNOSIS — N813 Complete uterovaginal prolapse: Secondary | ICD-10-CM

## 2022-05-08 NOTE — Progress Notes (Signed)
[facility] Return Visit  SUBJECTIVE  History of Present Illness: Bailey [name] is a 66 y.o. female seen in follow-up for prolapse. She underwent urodynamic testing and is anxious to proceed with surgery.   Urodynamic Impression:  1. Sensation was normal; capacity was normal 2. Stress Incontinence was not demonstrated 3. Detrusor Overactivity was not demonstrated  4. Emptying was dysfunctional with a elevated PVR (440m), a sustained detrusor contraction present,  abdominal straining not present, normal urethral sphincter activity on EMG.  Past Medical History: Patient  has a past medical history of Arthritis, Cataract, COPD (chronic obstructive pulmonary disease) ([place], Diabetes mellitus without complication ([place], Fibrocystic breast disease (July 2016), GERD (gastroesophageal reflux disease), Hyperlipidemia, and Hypertension.   Past Surgical History: She  has a past surgical history that includes Cholecystectomy; Uterine fibroid surgery; Cataract extraction; Colonoscopy; Breast biopsy (Right, 04/19/2015); Gallbladder surgery; and Breast biopsy (Right, 11/01/2020).   Medications: She has a current medication list which includes the following prescription(s): acetaminophen, albuterol, alendronate, atorvastatin, blood glucose meter kit and supplies, accu-chek guide me, azo boric acid, butalbital-acetaminophen-caffeine, canagliflozin, cholestyramine, cyclobenzaprine, dicyclomine, glimepiride, accu-chek guide, lisinopril, metformin, pantoprazole, and triamcinolone cream.   Allergies: Patient has no allergies on file.   Social History: Patient  reports that she has been smoking cigarettes. She has a 34.00 pack-year smoking history. She has been exposed to tobacco smoke. She has never used smokeless tobacco. She reports current alcohol use. She reports that she does not use drugs.      OBJECTIVE     Physical Exam: Vitals:   05/08/22 0813  BP: (!) 153/83  Pulse: 87    Gen: No apparent distress, A&O x 3.  Detailed Urogynecologic Evaluation:  Deferred. Prior exam showed:  POP-Q   3                                            Aa   7                                           Ba   7                                              C    5.5                                            Gh   4                                            Pb   7                                            tvl    3  Ap   7                                            Bp   -3                                              D      ASSESSMENT AND PLAN    Ms. Vollrath is a 66 y.o. with:  1. Uterovaginal prolapse, complete   2. Prolapse of anterior vaginal wall   3. Prolapse of posterior vaginal wall    -We reviewed the results of her UDT. She will not need anti-incontinence procedure. We discussed that she is at risk of needing a catheter after surgery as she has incomplete bladder emptying. This should improve with fixing the prolapse but can take some time for her bladder to recovery fully.   Plan for surgery: Exam under anesthesia, total vaginal hysterectomy, bilateral salpingo-oophorectomy, uterosacral ligament suspension, anterior and posterior repair, perineorrhaphy, cystoscopy   - We reviewed the patient's specific anatomic and functional findings, with the assistance of diagrams, and together finalized the above procedure. The planned surgical procedures were discussed along with the surgical risks outlined below, which were also provided on a detailed handout. Additional treatment options including expectant management, conservative management, medical management were discussed where appropriate.  We reviewed the benefits and risks of each treatment option.   General Surgical Risks: For all procedures, there are risks of bleeding, infection, damage to surrounding organs including but not limited to bowel, bladder, blood vessels,  ureters and nerves, and need for further surgery if an injury were to occur. These risks are all low with minimally invasive surgery.   There are risks of numbness and weakness at any body site or buttock/rectal pain.  It is possible that baseline pain can be worsened by surgery, either with or without mesh. If surgery is vaginal, there is also a low risk of possible conversion to laparoscopy or open abdominal incision where indicated. Very rare risks include blood transfusion, blood clot, heart attack, pneumonia, or death.   There is also a risk of short-term postoperative urinary retention with need to use a catheter. About half of patients need to go home from surgery with a catheter, which is then later removed in the office. The risk of long-term need for a catheter is very low. There is also a risk of worsening of overactive bladder.   Prolapse (with or without mesh): Risk factors for surgical failure  include things that put pressure on your pelvis and the surgical repair, including obesity, chronic cough, and heavy lifting or straining (including lifting children or adults, straining on the toilet, or lifting heavy objects such as furniture or anything weighing >25 lbs. Risks of recurrence is 20-30% with vaginal native tissue repair and a less than 10% with sacrocolpopexy with mesh.    - For preop Visit:  She is required to have a visit within 30 days of her surgery.   Today we reviewed pre-operative preparation, peri-operative expectations, and post-operative instructions/recovery.  She was provided with instructional handouts. She understands not to take aspirin (>52m) or NSAIDs 7 days prior to surgery. Prescriptions will be provided for: Oxycodone 567m Ibuprofen 60013mTylenol 500m22miralax. These prescriptions  will be sent prior to surgery.  - Medical clearance: not required A1c on 01/05/22 was 7.2 - Anticoagulant use: No - Medicaid Hysterectomy form: No - Accepts blood transfusion:  Yes - Expected length of stay: outpatient  Request sent for surgery scheduling.   Jaquita Folds, MD

## 2022-05-15 ENCOUNTER — Emergency Department (HOSPITAL_COMMUNITY): Payer: Medicare HMO

## 2022-05-15 ENCOUNTER — Inpatient Hospital Stay (HOSPITAL_COMMUNITY): Payer: Medicare HMO | Admitting: Anesthesiology

## 2022-05-15 ENCOUNTER — Encounter (HOSPITAL_COMMUNITY): Payer: Self-pay | Admitting: Neurology

## 2022-05-15 ENCOUNTER — Inpatient Hospital Stay (HOSPITAL_COMMUNITY): Payer: Medicare HMO

## 2022-05-15 ENCOUNTER — Encounter (HOSPITAL_COMMUNITY)
Admission: EM | Disposition: A | Discharge status: Skilled Nursing Facility | Payer: Self-pay | Source: Home / Self Care | Attending: Neurology

## 2022-05-15 ENCOUNTER — Inpatient Hospital Stay (HOSPITAL_COMMUNITY)
Admission: EM | Admit: 2022-05-15 | Discharge: 2022-05-28 | DRG: 023 | Disposition: A | Discharge status: Skilled Nursing Facility | Payer: Medicare HMO | Attending: Neurology | Admitting: Neurology

## 2022-05-15 DIAGNOSIS — G936 Cerebral edema: Secondary | ICD-10-CM | POA: Diagnosis not present

## 2022-05-15 DIAGNOSIS — E7800 Pure hypercholesterolemia, unspecified: Secondary | ICD-10-CM | POA: Diagnosis not present

## 2022-05-15 DIAGNOSIS — I63232 Cerebral infarction due to unspecified occlusion or stenosis of left carotid arteries: Secondary | ICD-10-CM | POA: Diagnosis not present

## 2022-05-15 DIAGNOSIS — I161 Hypertensive emergency: Secondary | ICD-10-CM | POA: Diagnosis not present

## 2022-05-15 DIAGNOSIS — R4182 Altered mental status, unspecified: Principal | ICD-10-CM

## 2022-05-15 DIAGNOSIS — J449 Chronic obstructive pulmonary disease, unspecified: Secondary | ICD-10-CM | POA: Diagnosis not present

## 2022-05-15 DIAGNOSIS — I6602 Occlusion and stenosis of left middle cerebral artery: Secondary | ICD-10-CM

## 2022-05-15 DIAGNOSIS — F4489 Other dissociative and conversion disorders: Secondary | ICD-10-CM | POA: Diagnosis not present

## 2022-05-15 DIAGNOSIS — I6523 Occlusion and stenosis of bilateral carotid arteries: Secondary | ICD-10-CM | POA: Diagnosis not present

## 2022-05-15 DIAGNOSIS — R414 Neurologic neglect syndrome: Secondary | ICD-10-CM | POA: Diagnosis not present

## 2022-05-15 DIAGNOSIS — Y832 Surgical operation with anastomosis, bypass or graft as the cause of abnormal reaction of the patient, or of later complication, without mention of misadventure at the time of the procedure: Secondary | ICD-10-CM | POA: Diagnosis not present

## 2022-05-15 DIAGNOSIS — Z7401 Bed confinement status: Secondary | ICD-10-CM | POA: Diagnosis not present

## 2022-05-15 DIAGNOSIS — I63512 Cerebral infarction due to unspecified occlusion or stenosis of left middle cerebral artery: Principal | ICD-10-CM | POA: Diagnosis present

## 2022-05-15 DIAGNOSIS — E785 Hyperlipidemia, unspecified: Secondary | ICD-10-CM | POA: Diagnosis present

## 2022-05-15 DIAGNOSIS — R0689 Other abnormalities of breathing: Secondary | ICD-10-CM | POA: Diagnosis not present

## 2022-05-15 DIAGNOSIS — I610 Nontraumatic intracerebral hemorrhage in hemisphere, subcortical: Secondary | ICD-10-CM | POA: Diagnosis not present

## 2022-05-15 DIAGNOSIS — D649 Anemia, unspecified: Secondary | ICD-10-CM | POA: Diagnosis present

## 2022-05-15 DIAGNOSIS — J9601 Acute respiratory failure with hypoxia: Secondary | ICD-10-CM | POA: Diagnosis not present

## 2022-05-15 DIAGNOSIS — R29722 NIHSS score 22: Secondary | ICD-10-CM | POA: Diagnosis present

## 2022-05-15 DIAGNOSIS — I639 Cerebral infarction, unspecified: Secondary | ICD-10-CM | POA: Diagnosis not present

## 2022-05-15 DIAGNOSIS — R531 Weakness: Secondary | ICD-10-CM | POA: Diagnosis not present

## 2022-05-15 DIAGNOSIS — G8191 Hemiplegia, unspecified affecting right dominant side: Secondary | ICD-10-CM | POA: Diagnosis not present

## 2022-05-15 DIAGNOSIS — I6389 Other cerebral infarction: Secondary | ICD-10-CM | POA: Diagnosis not present

## 2022-05-15 DIAGNOSIS — Z833 Family history of diabetes mellitus: Secondary | ICD-10-CM

## 2022-05-15 DIAGNOSIS — Z7983 Long term (current) use of bisphosphonates: Secondary | ICD-10-CM

## 2022-05-15 DIAGNOSIS — I10 Essential (primary) hypertension: Secondary | ICD-10-CM | POA: Diagnosis present

## 2022-05-15 DIAGNOSIS — Z6832 Body mass index (BMI) 32.0-32.9, adult: Secondary | ICD-10-CM

## 2022-05-15 DIAGNOSIS — F17210 Nicotine dependence, cigarettes, uncomplicated: Secondary | ICD-10-CM | POA: Diagnosis not present

## 2022-05-15 DIAGNOSIS — T82858A Stenosis of vascular prosthetic devices, implants and grafts, initial encounter: Secondary | ICD-10-CM | POA: Diagnosis not present

## 2022-05-15 DIAGNOSIS — E1151 Type 2 diabetes mellitus with diabetic peripheral angiopathy without gangrene: Secondary | ICD-10-CM | POA: Diagnosis not present

## 2022-05-15 DIAGNOSIS — R262 Difficulty in walking, not elsewhere classified: Secondary | ICD-10-CM | POA: Diagnosis not present

## 2022-05-15 DIAGNOSIS — Z800 Family history of malignant neoplasm of digestive organs: Secondary | ICD-10-CM | POA: Diagnosis not present

## 2022-05-15 DIAGNOSIS — E669 Obesity, unspecified: Secondary | ICD-10-CM | POA: Diagnosis present

## 2022-05-15 DIAGNOSIS — I69351 Hemiplegia and hemiparesis following cerebral infarction affecting right dominant side: Secondary | ICD-10-CM | POA: Diagnosis not present

## 2022-05-15 DIAGNOSIS — R1310 Dysphagia, unspecified: Secondary | ICD-10-CM | POA: Diagnosis present

## 2022-05-15 DIAGNOSIS — N390 Urinary tract infection, site not specified: Secondary | ICD-10-CM | POA: Diagnosis not present

## 2022-05-15 DIAGNOSIS — E11649 Type 2 diabetes mellitus with hypoglycemia without coma: Secondary | ICD-10-CM | POA: Diagnosis not present

## 2022-05-15 DIAGNOSIS — I63522 Cerebral infarction due to unspecified occlusion or stenosis of left anterior cerebral artery: Secondary | ICD-10-CM

## 2022-05-15 DIAGNOSIS — I69822 Dysarthria following other cerebrovascular disease: Secondary | ICD-10-CM | POA: Diagnosis not present

## 2022-05-15 DIAGNOSIS — E118 Type 2 diabetes mellitus with unspecified complications: Secondary | ICD-10-CM | POA: Diagnosis not present

## 2022-05-15 DIAGNOSIS — Z79899 Other long term (current) drug therapy: Secondary | ICD-10-CM

## 2022-05-15 DIAGNOSIS — Z4682 Encounter for fitting and adjustment of non-vascular catheter: Secondary | ICD-10-CM | POA: Diagnosis not present

## 2022-05-15 DIAGNOSIS — Z7984 Long term (current) use of oral hypoglycemic drugs: Secondary | ICD-10-CM | POA: Diagnosis not present

## 2022-05-15 DIAGNOSIS — I69391 Dysphagia following cerebral infarction: Secondary | ICD-10-CM | POA: Diagnosis not present

## 2022-05-15 DIAGNOSIS — R4701 Aphasia: Secondary | ICD-10-CM | POA: Diagnosis present

## 2022-05-15 DIAGNOSIS — Z8673 Personal history of transient ischemic attack (TIA), and cerebral infarction without residual deficits: Secondary | ICD-10-CM | POA: Diagnosis not present

## 2022-05-15 DIAGNOSIS — I959 Hypotension, unspecified: Secondary | ICD-10-CM | POA: Diagnosis not present

## 2022-05-15 DIAGNOSIS — I619 Nontraumatic intracerebral hemorrhage, unspecified: Secondary | ICD-10-CM | POA: Diagnosis not present

## 2022-05-15 DIAGNOSIS — Z823 Family history of stroke: Secondary | ICD-10-CM

## 2022-05-15 DIAGNOSIS — R1312 Dysphagia, oropharyngeal phase: Secondary | ICD-10-CM | POA: Diagnosis not present

## 2022-05-15 DIAGNOSIS — R29810 Facial weakness: Secondary | ICD-10-CM | POA: Diagnosis present

## 2022-05-15 DIAGNOSIS — D72829 Elevated white blood cell count, unspecified: Secondary | ICD-10-CM | POA: Diagnosis not present

## 2022-05-15 DIAGNOSIS — F17200 Nicotine dependence, unspecified, uncomplicated: Secondary | ICD-10-CM | POA: Diagnosis not present

## 2022-05-15 DIAGNOSIS — Z8249 Family history of ischemic heart disease and other diseases of the circulatory system: Secondary | ICD-10-CM

## 2022-05-15 DIAGNOSIS — M6281 Muscle weakness (generalized): Secondary | ICD-10-CM | POA: Diagnosis not present

## 2022-05-15 DIAGNOSIS — R29818 Other symptoms and signs involving the nervous system: Secondary | ICD-10-CM | POA: Diagnosis not present

## 2022-05-15 DIAGNOSIS — R000 Tachycardia, unspecified: Secondary | ICD-10-CM | POA: Diagnosis not present

## 2022-05-15 DIAGNOSIS — I69320 Aphasia following cerebral infarction: Secondary | ICD-10-CM | POA: Diagnosis not present

## 2022-05-15 DIAGNOSIS — E1165 Type 2 diabetes mellitus with hyperglycemia: Secondary | ICD-10-CM | POA: Diagnosis present

## 2022-05-15 HISTORY — PX: RADIOLOGY WITH ANESTHESIA: SHX6223

## 2022-05-15 LAB — HEMOGLOBIN AND HEMATOCRIT, BLOOD
HCT: 41.1 % (ref 36.0–46.0)
Hemoglobin: 12.5 g/dL (ref 12.0–15.0)

## 2022-05-15 LAB — I-STAT CHEM 8, ED
BUN: 6 mg/dL — ABNORMAL LOW (ref 8–23)
Calcium, Ion: 0.81 mmol/L — CL (ref 1.15–1.40)
Chloride: 116 mmol/L — ABNORMAL HIGH (ref 98–111)
Creatinine, Ser: <0.2 mg/dL — ABNORMAL LOW (ref 0.44–1.00)
Glucose, Bld: 88 mg/dL (ref 70–99)
HCT: 26 % — ABNORMAL LOW (ref 36.0–46.0)
Hemoglobin: 8.8 g/dL — ABNORMAL LOW (ref 12.0–15.0)
Potassium: 2.6 mmol/L — CL (ref 3.5–5.1)
Sodium: 146 mmol/L — ABNORMAL HIGH (ref 135–145)
TCO2: 15 mmol/L — ABNORMAL LOW (ref 22–32)

## 2022-05-15 LAB — COMPREHENSIVE METABOLIC PANEL
ALT: 9 U/L (ref 0–44)
ALT: 19 U/L (ref 0–44)
AST: 8 U/L — ABNORMAL LOW (ref 15–41)
AST: 20 U/L (ref 15–41)
Albumin: <1.5 g/dL — ABNORMAL LOW (ref 3.5–5.0)
Albumin: 3.6 g/dL (ref 3.5–5.0)
Alkaline Phosphatase: 37 U/L — ABNORMAL LOW (ref 38–126)
Alkaline Phosphatase: 86 U/L (ref 38–126)
Anion gap: 3 — ABNORMAL LOW (ref 5–15)
Anion gap: 10 (ref 5–15)
BUN: 5 mg/dL — ABNORMAL LOW (ref 8–23)
BUN: 10 mg/dL (ref 8–23)
CO2: 12 mmol/L — ABNORMAL LOW (ref 22–32)
CO2: 20 mmol/L — ABNORMAL LOW (ref 22–32)
Calcium: 4.3 mg/dL — CL (ref 8.9–10.3)
Calcium: 9.8 mg/dL (ref 8.9–10.3)
Chloride: 129 mmol/L — ABNORMAL HIGH (ref 98–111)
Chloride: 107 mmol/L (ref 98–111)
Creatinine, Ser: <0.3 mg/dL — ABNORMAL LOW (ref 0.44–1.00)
Creatinine, Ser: 0.62 mg/dL (ref 0.44–1.00)
GFR, Estimated: >60 mL/min (ref >60)
Glucose, Bld: 79 mg/dL (ref 70–99)
Glucose, Bld: 130 mg/dL — ABNORMAL HIGH (ref 70–99)
Potassium: <2 mmol/L — CL (ref 3.5–5.1)
Potassium: 4.3 mmol/L (ref 3.5–5.1)
Sodium: 144 mmol/L (ref 135–145)
Sodium: 137 mmol/L (ref 135–145)
Total Bilirubin: <0.1 mg/dL — ABNORMAL LOW (ref 0.3–1.2)
Total Bilirubin: 0.4 mg/dL (ref 0.3–1.2)
Total Protein: <3 g/dL — ABNORMAL LOW (ref 6.5–8.1)
Total Protein: 7.2 g/dL (ref 6.5–8.1)

## 2022-05-15 LAB — DIFFERENTIAL
Abs Immature Granulocytes: 0.04 10*3/uL (ref 0.00–0.07)
Basophils Absolute: 0 10*3/uL (ref 0.0–0.1)
Basophils Relative: 0 %
Eosinophils Absolute: 0 10*3/uL (ref 0.0–0.5)
Eosinophils Relative: 0 %
Immature Granulocytes: 1 %
Lymphocytes Relative: 13 %
Lymphs Abs: 0.9 10*3/uL (ref 0.7–4.0)
Monocytes Absolute: 0.4 10*3/uL (ref 0.1–1.0)
Monocytes Relative: 6 %
Neutro Abs: 5.5 10*3/uL (ref 1.7–7.7)
Neutrophils Relative %: 80 %

## 2022-05-15 LAB — ACETAMINOPHEN LEVEL: Acetaminophen (Tylenol), Serum: <10 ug/mL — ABNORMAL LOW (ref 10–30)

## 2022-05-15 LAB — CBG MONITORING, ED: Glucose-Capillary: 152 mg/dL — ABNORMAL HIGH (ref 70–99)

## 2022-05-15 LAB — CBC
HCT: 20.3 % — ABNORMAL LOW (ref 36.0–46.0)
Hemoglobin: 6.1 g/dL — CL (ref 12.0–15.0)
MCH: 22.3 pg — ABNORMAL LOW (ref 26.0–34.0)
MCHC: 30 g/dL (ref 30.0–36.0)
MCV: 74.4 fL — ABNORMAL LOW (ref 80.0–100.0)
Platelets: 158 10*3/uL (ref 150–400)
RBC: 2.73 MIL/uL — ABNORMAL LOW (ref 3.87–5.11)
RDW: 18.7 % — ABNORMAL HIGH (ref 11.5–15.5)
WBC: 6.9 10*3/uL (ref 4.0–10.5)
nRBC: 0 % (ref 0.0–0.2)

## 2022-05-15 LAB — ETHANOL
Alcohol, Ethyl (B): <10 mg/dL (ref <10)
Alcohol, Ethyl (B): <10 mg/dL (ref <10)

## 2022-05-15 LAB — URINALYSIS, ROUTINE W REFLEX MICROSCOPIC
Bacteria, UA: NONE SEEN
Bilirubin Urine: NEGATIVE
Glucose, UA: >=500 mg/dL — AB
Ketones, ur: NEGATIVE mg/dL
Nitrite: NEGATIVE
Protein, ur: NEGATIVE mg/dL
Specific Gravity, Urine: 1.012 (ref 1.005–1.030)
WBC, UA: >50 WBC/hpf — ABNORMAL HIGH (ref 0–5)
pH: 5 (ref 5.0–8.0)

## 2022-05-15 LAB — APTT: aPTT: 23 seconds — ABNORMAL LOW (ref 24–36)

## 2022-05-15 LAB — PROTIME-INR
INR: 1 (ref 0.8–1.2)
Prothrombin Time: 13.2 seconds (ref 11.4–15.2)

## 2022-05-15 LAB — MAGNESIUM: Magnesium: 1.5 mg/dL — ABNORMAL LOW (ref 1.7–2.4)

## 2022-05-15 LAB — SALICYLATE LEVEL: Salicylate Lvl: <7 mg/dL — ABNORMAL LOW (ref 7.0–30.0)

## 2022-05-15 SURGERY — IR WITH ANESTHESIA
Anesthesia: General

## 2022-05-15 MED ORDER — IOHEXOL 350 MG/ML SOLN
100.0000 mL | Freq: Once | INTRAVENOUS | Status: AC | PRN
  Administered 2022-05-15: 100 mL via INTRAVENOUS

## 2022-05-15 MED ORDER — CEFAZOLIN SODIUM-DEXTROSE 2-4 GM/100ML-% IV SOLN
INTRAVENOUS | Status: AC
  Filled 2022-05-15: qty 100

## 2022-05-15 MED ORDER — CLEVIDIPINE BUTYRATE 0.5 MG/ML IV EMUL
INTRAVENOUS | Status: AC
  Filled 2022-05-15: qty 50

## 2022-05-15 MED ORDER — IOHEXOL 300 MG/ML  SOLN
100.0000 mL | Freq: Once | INTRAMUSCULAR | Status: AC | PRN
  Administered 2022-05-15: 75 mL via INTRA_ARTERIAL

## 2022-05-15 MED ORDER — NITROGLYCERIN 1 MG/10 ML FOR IR/CATH LAB
INTRA_ARTERIAL | Status: AC
  Filled 2022-05-15: qty 10

## 2022-05-15 MED ORDER — ACETAMINOPHEN 325 MG PO TABS: 650.0000 mg | ORAL_TABLET | ORAL | Status: DC | PRN

## 2022-05-15 MED ORDER — PANTOPRAZOLE SODIUM 40 MG IV SOLR
40.0000 mg | Freq: Every day | INTRAVENOUS | Status: DC
  Administered 2022-05-16 (×2): 40 mg via INTRAVENOUS
  Filled 2022-05-15 (×2): qty 10

## 2022-05-15 MED ORDER — LIDOCAINE HCL (CARDIAC) PF 50 MG/5ML IV SOSY
PREFILLED_SYRINGE | INTRAVENOUS | Status: DC | PRN
  Administered 2022-05-15: 100 mg via INTRAVENOUS

## 2022-05-15 MED ORDER — PHENYLEPHRINE HCL (PRESSORS) 10 MG/ML IV SOLN
INTRAVENOUS | Status: DC | PRN
  Administered 2022-05-15: 80 ug via INTRAVENOUS
  Administered 2022-05-15: 120 ug via INTRAVENOUS
  Administered 2022-05-16: 80 ug via INTRAVENOUS

## 2022-05-15 MED ORDER — FENTANYL CITRATE (PF) 100 MCG/2ML IJ SOLN
INTRAMUSCULAR | Status: AC
  Filled 2022-05-15: qty 2

## 2022-05-15 MED ORDER — PHENYLEPHRINE HCL-NACL 20-0.9 MG/250ML-% IV SOLN
INTRAVENOUS | Status: DC | PRN
  Administered 2022-05-15: 50 ug/min via INTRAVENOUS

## 2022-05-15 MED ORDER — ACETAMINOPHEN 160 MG/5ML PO SOLN: 650.0000 mg | ORAL | Status: DC | PRN

## 2022-05-15 MED ORDER — SODIUM CHLORIDE 0.9 % IV SOLN: INTRAVENOUS | Status: DC

## 2022-05-15 MED ORDER — ASPIRIN 81 MG PO CHEW
CHEWABLE_TABLET | ORAL | Status: AC
  Filled 2022-05-15: qty 1

## 2022-05-15 MED ORDER — FENTANYL CITRATE (PF) 100 MCG/2ML IJ SOLN
INTRAMUSCULAR | Status: DC | PRN
  Administered 2022-05-15 (×2): 25 ug via INTRAVENOUS
  Administered 2022-05-16: 50 ug via INTRAVENOUS

## 2022-05-15 MED ORDER — SODIUM CHLORIDE 0.9% FLUSH: 3.0000 mL | Freq: Once | INTRAVENOUS | Status: DC

## 2022-05-15 MED ORDER — ACETAMINOPHEN 650 MG RE SUPP: 650.0000 mg | RECTAL | Status: DC | PRN

## 2022-05-15 MED ORDER — PROPOFOL 10 MG/ML IV BOLUS
INTRAVENOUS | Status: DC | PRN
  Administered 2022-05-15: 200 mg via INTRAVENOUS
  Administered 2022-05-16: 40 mg via INTRAVENOUS
  Administered 2022-05-16: 20 mg via INTRAVENOUS

## 2022-05-15 MED ORDER — CLEVIDIPINE BUTYRATE 0.5 MG/ML IV EMUL
INTRAVENOUS | Status: DC | PRN
  Administered 2022-05-15: 4 mg/h via INTRAVENOUS

## 2022-05-15 MED ORDER — ROCURONIUM BROMIDE 100 MG/10ML IV SOLN
INTRAVENOUS | Status: DC | PRN
  Administered 2022-05-15: 80 mg via INTRAVENOUS
  Administered 2022-05-16: 20 mg via INTRAVENOUS

## 2022-05-15 MED ORDER — TICAGRELOR 60 MG PO TABS
ORAL_TABLET | ORAL | Status: AC | PRN
  Administered 2022-05-15: 180 mg

## 2022-05-15 MED ORDER — SODIUM CHLORIDE 0.9 % IV SOLN: INTRAVENOUS | Status: DC | PRN

## 2022-05-15 MED ORDER — SENNOSIDES-DOCUSATE SODIUM 8.6-50 MG PO TABS: 1.0000 | ORAL_TABLET | Freq: Every evening | ORAL | Status: DC | PRN

## 2022-05-15 MED ORDER — STROKE: EARLY STAGES OF RECOVERY BOOK
Freq: Once | Status: AC
  Filled 2022-05-15: qty 1

## 2022-05-15 MED ORDER — TICAGRELOR 90 MG PO TABS
ORAL_TABLET | ORAL | Status: AC
  Filled 2022-05-15: qty 2

## 2022-05-15 MED ORDER — ASPIRIN 81 MG PO CHEW
CHEWABLE_TABLET | ORAL | Status: AC | PRN
  Administered 2022-05-15: 81 mg

## 2022-05-15 MED ORDER — SODIUM CHLORIDE 0.9 % IV BOLUS: 1000.0000 mL | Freq: Once | INTRAVENOUS | Status: DC

## 2022-05-15 NOTE — Anesthesia Procedure Notes (Signed)
Procedure Name: Intubation Date/Time: 05/15/2022 10:43 PM  Performed by: Suzy Bouchard, CRNAPre-anesthesia Checklist: Patient identified, Emergency Drugs available, Suction available, Patient being monitored and Timeout performed Patient Re-evaluated:Patient Re-evaluated prior to induction Oxygen Delivery Method: Circle system utilized Preoxygenation: Pre-oxygenation with 100% oxygen Induction Type: IV induction Ventilation: Mask ventilation without difficulty Laryngoscope Size: Glidescope and 4 Grade View: Grade I Tube type: Oral Tube size: 7.0 mm Number of attempts: 1 Airway Equipment and Method: Stylet and Video-laryngoscopy Placement Confirmation: ETT inserted through vocal cords under direct vision, positive ETCO2 and breath sounds checked- equal and bilateral Secured at: 22 cm Tube secured with: Tape Dental Injury: Teeth and Oropharynx as per pre-operative assessment

## 2022-05-15 NOTE — ED Triage Notes (Signed)
Pt BIB [facility] from home, no LKW. Patient leaning to the right, repetitive statements, unable to follow commands. EMS reports that patient tried to call her dr at 1115 this am.

## 2022-05-15 NOTE — ED Provider Notes (Signed)
[facility] EMERGENCY DEPARTMENT Provider Note   CSN: [id number] Arrival date & time: 05/15/22  1641     History HTN, DM, tobacco use Chief Complaint  Patient presents with   Altered Mental Status    Bailey [name] is a 66 y.o. female.  66 y.o female with a PMH DM, HTN, Tobacco use presents to the ED via EMS for altered mental status.  Patient found with repetitive words, stating "I cant talk", last normal is unknown.  Granddaughter called her dad who checked on own patient.  The last phone call on her cell phone was at 11 AM as she was trying to call her doctor.  She somewhat worsened in route, experiencing now some facial droop, does not follow commands, weakness to the right arm and leg.  Difficult to obtain history at this time due to mental status.  The history is provided by the EMS personnel and medical records.  Altered Mental Status      Home Medications Prior to Admission medications   Medication Sig Start Date End Date Taking? Authorizing Provider  acetaminophen (TYLENOL) 500 MG tablet Take 1,000 mg by mouth every 6 (six) hours as needed (pain). Reported on 04/08/2016    [provider]  albuterol (VENTOLIN HFA) 108 (90 Base) MCG/ACT inhaler Inhale 2 puffs into the lungs every 6 (six) hours as needed for wheezing or shortness of breath. 08/21/21   Copland, Gay Filler, MD  alendronate (FOSAMAX) 70 MG tablet Take 1 tablet (70 mg total) by mouth every 7 (seven) days. Take with a full glass of water on an empty stomach. 01/05/22   Copland, Gay Filler, MD  atorvastatin (LIPITOR) 40 MG tablet TAKE 1 TABLET EVERY DAY 12/12/21   Copland, Gay Filler, MD  blood glucose meter kit and supplies KIT Dispense based on patient and insurance preference. Use up to four times daily as directed. (FOR ICD-9 250.00, 250.01). 09/26/15   Jaynee Eagles, PA-C  Blood Glucose Monitoring Suppl (ACCU-CHEK GUIDE ME) w/Device KIT Use to check blood sugar once a day. Dx code: E11.9  10/24/20   Copland, Gay Filler, MD  Boric Acid Vaginal (AZO BORIC ACID) 600 MG SUPP Place 1 suppository vaginally at bedtime. 01/02/22   Shelly Bombard, MD  butalbital-acetaminophen-caffeine (FIORICET) [phone] MG tablet Take 1-2 tablets by mouth every 6 (six) hours as needed for headache. Max 6 per day 04/16/22   Copland, Gay Filler, MD  canagliflozin (INVOKANA) 100 MG TABS tablet Take 1 tablet (100 mg total) by mouth daily before breakfast. 03/12/22   Copland, Gay Filler, MD  cholestyramine (QUESTRAN) 4 g packet DISSOLVE & TAKE 1 POWDER PACKET BY MOUTH TWICE DAILY 02/19/21   Copland, Gay Filler, MD  cyclobenzaprine (FLEXERIL) 5 MG tablet Take 1 tablet (5 mg total) by mouth at bedtime. Use as needed for neck pain 07/02/21   Copland, Gay Filler, MD  dicyclomine (BENTYL) 20 MG tablet Take 1 tablet (20 mg total) by mouth every 4 (four) hours. 06/04/21   Levin Erp, PA  glimepiride (AMARYL) 4 MG tablet TAKE 2 TABLETS EVERY DAY WITH BREAKFAST 12/12/21   Copland, Gay Filler, MD  glucose blood (ACCU-CHEK GUIDE) test strip USE ONE TIME DAILY TO TEST BLOOD SUGAR 03/28/21   Copland, Gay Filler, MD  lisinopril (ZESTRIL) 40 MG tablet Take 1 tablet (40 mg total) by mouth daily. 04/16/22   Copland, Gay Filler, MD  metFORMIN (GLUCOPHAGE) 1000 MG tablet Take 1 tablet (1,000 mg total) by mouth 2 (two)  times daily with a meal. 02/20/22   Copland, Gay Filler, MD  pantoprazole (PROTONIX) 40 MG tablet Take 1 tablet (40 mg total) by mouth 2 (two) times daily. 02/20/22   Copland, Gay Filler, MD  triamcinolone cream (KENALOG) 0.1 % Apply 1 application topically 2 (two) times daily. Use as needed on dry or scaly areas 03/30/19   Copland, Gay Filler, MD      Allergies    Patient has no allergy information on record.    Review of Systems   Review of Systems  Unable to perform ROS: Mental status change    Physical Exam Updated Vital Signs BP (!) 195/101   Pulse 99   Temp 99.2 F (37.3 C) (Oral)   Resp 18   SpO2 99%  Physical  Exam Vitals and nursing note reviewed.  HENT:     Head: Normocephalic and atraumatic.     Mouth/Throat:     Mouth: Mucous membranes are dry.  Eyes:     Pupils: Pupils are equal, round, and reactive to light.  Cardiovascular:     Rate and Rhythm: Tachycardia present.  Pulmonary:     Breath sounds: No wheezing.  Abdominal:     General: Abdomen is flat.  Musculoskeletal:     Cervical back: Normal range of motion and neck supple.  Neurological:     Mental Status: She is alert. She is disoriented.     Cranial Nerves: Dysarthria and facial asymmetry present.     Comments: There is a facial droop note to the left side Decrease strength from right upper extremity, right lower extremity.  Patient does not follow commands, some dysarthria noted. Unable to answers questions.      ED Results / Procedures / Treatments   Labs (all labs ordered are listed, but only abnormal results are displayed) Labs Reviewed  APTT - Abnormal; Notable for the following components:      Result Value   aPTT 23 (*)    All other components within normal limits  CBC - Abnormal; Notable for the following components:   RBC 2.73 (*)    Hemoglobin 6.1 (*)    HCT 20.3 (*)    MCV 74.4 (*)    MCH 22.3 (*)    RDW 18.7 (*)    All other components within normal limits  COMPREHENSIVE METABOLIC PANEL - Abnormal; Notable for the following components:   Potassium <2.0 (*)    Chloride 129 (*)    CO2 12 (*)    BUN 5 (*)    Creatinine, Ser <0.30 (*)    Calcium 4.3 (*)    Total Protein <3.0 (*)    Albumin <1.5 (*)    AST 8 (*)    Alkaline Phosphatase 37 (*)    Total Bilirubin <0.1 (*)    Anion gap 3 (*)    All other components within normal limits  COMPREHENSIVE METABOLIC PANEL - Abnormal; Notable for the following components:   CO2 20 (*)    Glucose, Bld 130 (*)    All other components within normal limits  MAGNESIUM - Abnormal; Notable for the following components:   Magnesium 1.5 (*)    All other  components within normal limits  SALICYLATE LEVEL - Abnormal; Notable for the following components:   Salicylate Lvl <9.4 (*)    All other components within normal limits  ACETAMINOPHEN LEVEL - Abnormal; Notable for the following components:   Acetaminophen (Tylenol), Serum <10 (*)    All other components within normal  limits  URINALYSIS, ROUTINE W REFLEX MICROSCOPIC - Abnormal; Notable for the following components:   APPearance HAZY (*)    Glucose, UA >=500 (*)    Hgb urine dipstick SMALL (*)    Leukocytes,Ua LARGE (*)    WBC, UA >50 (*)    All other components within normal limits  I-STAT CHEM 8, ED - Abnormal; Notable for the following components:   Sodium 146 (*)    Potassium 2.6 (*)    Chloride 116 (*)    BUN 6 (*)    Creatinine, Ser <0.20 (*)    Calcium, Ion 0.81 (*)    TCO2 15 (*)    Hemoglobin 8.8 (*)    HCT 26.0 (*)    All other components within normal limits  CBG MONITORING, ED - Abnormal; Notable for the following components:   Glucose-Capillary 152 (*)    All other components within normal limits  PROTIME-INR  DIFFERENTIAL  ETHANOL  HEMOGLOBIN AND HEMATOCRIT, BLOOD  ETHANOL  CALCIUM, IONIZED  RAPID URINE DRUG SCREEN, HOSP PERFORMED  HIV ANTIBODY (ROUTINE TESTING W REFLEX)  LIPID PANEL    EKG EKG Interpretation  Date/Time:  Friday May 15 2022 16:46:51 EDT Ventricular Rate:  112 PR Interval:  150 QRS Duration: 94 QT Interval:  332 QTC Calculation: 452 R Axis:   8 Text Interpretation: Sinus tachycardia Probable left atrial enlargement LVH with secondary repolarization abnormality Artifact in lead(s) I III aVR aVL Nonspecific T wave abnormality RESOLVED SINCE PREVIOUS Confirmed by Blanchie Dessert [phone]) on 05/15/2022 4:49:01 PM  Radiology CT ANGIO HEAD NECK W WO CM W PERF (CODE STROKE)  Result Date: 05/15/2022 CLINICAL DATA:  Right-sided weakness.  Left MCA territory infarcts. EXAM: CT ANGIOGRAPHY HEAD AND NECK CT PERFUSION BRAIN TECHNIQUE:  Multidetector CT imaging of the head and neck was performed using the standard protocol during bolus administration of intravenous contrast. Multiplanar CT image reconstructions and MIPs were obtained to evaluate the vascular anatomy. Carotid stenosis measurements (when applicable) are obtained utilizing NASCET criteria, using the distal internal carotid diameter as the denominator. Multiphase CT imaging of the brain was performed following IV bolus contrast injection. Subsequent parametric perfusion maps were calculated using RAPID software. RADIATION DOSE REDUCTION: This exam was performed according to the departmental dose-optimization program which includes automated exposure control, adjustment of the mA and/or kV according to patient size and/or use of iterative reconstruction technique. CONTRAST:  174m OMNIPAQUE IOHEXOL 350 MG/ML SOLN COMPARISON:  CT HEAD without contrast 05/15/2022 9:34 P.M. FINDINGS: CTA NECK FINDINGS Aortic arch: Atherosclerotic changes are present the aortic arch and great vessel origins. No significant stenosis or aneurysm is present. Narrowing of less than 50% is present at the origin of the left common carotid artery due to noncalcified plaque. Right carotid system: Right common carotid artery is within normal limits. Calcified and noncalcified plaque bifurcation results in narrowing of the proximal right ICA. The lumen is narrowed to 1.5 mm. This compares with a more normal distal segment of 4 mm just below the skull base. There is some irregularity in the mid right ICA. Left carotid system: The left common carotid artery is within normal limits beyond the proximal segment. The left internal carotid artery is occluded without significant reconstitution in the neck. Vertebral arteries: The left vertebral artery is the dominant vessel. The right vertebral artery is occluded proximally, reconstituted at the level of C6. Additional segmental narrowing is present in the right P2 segment.  The more distal right vertebral artery is more normal. No  significant stenosis is present in the dominant left vertebral artery. Skeleton: Mild degenerative changes are present in the lower cervical spine. No focal osseous lesions are present. Alignment is anatomic. Straightening of the normal cervical lordosis is present. Other neck: Soft tissues the neck are otherwise unremarkable. Salivary glands are within normal limits. Thyroid is normal. No significant adenopathy is present. No focal mucosal or submucosal lesions are present. Upper chest: The lung apices are clear. Thoracic inlet is within normal limits. Review of the MIP images confirms the above findings CTA HEAD FINDINGS Anterior circulation: Atherosclerotic calcifications are present within the cavernous right ICA. Narrowing of less than 50% is noted relative to the more distal normal ICA terminus. The left cavernous ICA is reconstituted above the skull base. Dense calcifications are present in the anterior genu. There is some flow in the left ICA terminus. The right M1 and A1 segments are normal. The anterior communicating artery is patent. The left A1 segment is narrowed. Mild narrowing is present in the proximal left M1 segment. Contrast opacification is present in the distal left M1 segment. The right MCA branch vessels and ACA branch vessels are within normal limits. Anterior MCA branch vessels are intact. The posterior left M2 segment is occluded minimal collateral vessels are present. Posterior circulation: The left vertebral artery is dominant vessel. PICA origins are visualized and normal. Vertebrobasilar junction and basilar artery normal. Both posterior cerebral arteries originate from the basilar tip. Right PCA vessels within normal limits. High-grade stenosis or occlusion is present in the left P2 segment. Distal branch vessels are partially reconstituted. Venous sinuses: The dural sinuses are patent. The straight sinus deep cerebral veins are  intact. Cortical veins are within normal limits. No significant vascular malformation is evident. Anatomic variants: None Review of the MIP images confirms the above findings CT Brain Perfusion Findings: ASPECTS: 6/10 CBF (<30%) Volume: 70m Perfusion (Tmax>6.0s) volume: 1265mMismatch Volume: 6196mnfarction Location:Left posterior MCA territory These results were called by telephone at the time of interpretation on 05/15/2022 at 9:50 Pm to provider [facility] verbally acknowledged these results. IMPRESSION: 1. 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 2. The posterior left M2 segment is occluded with poor collaterals. 3. 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 4. High-grade stenosis or occlusion of the left P2 segment. 5. The left A1 segment is narrowed. 6. Atherosclerotic changes within the cavernous right ICA without significant stenosis. 7. Narrowing of less than 50% at the origin of the left common carotid artery. 8. 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 9. Aortic Atherosclerosis (ICD10-I70.0). These results were called by telephone at the time of interpretation on 05/15/2022 at 9:50pm to provider [facility]who verbally acknowledged these results. Electronically Signed   By: ChrSan MorelleD.   On: 05/15/2022 22:17   CT HEAD CODE STROKE WO CONTRAST  Addendum Date: 05/15/2022   ADDENDUM REPORT: 05/15/2022 21:49 ADDENDUM: After discussion with Dr. BhaCurly Shorespects is 6/10 within additional point deducted at the ganglionic level. Electronically Signed   By: ChrSan MorelleD.   On: 05/15/2022 21:49   Result Date: 05/15/2022 CLINICAL DATA:  Code stroke. Left MCA syndrome. Last known well 2 days ago. EXAM: CT HEAD WITHOUT CONTRAST TECHNIQUE: Contiguous axial images were  obtained from the base of the skull through the vertex without  intravenous contrast. RADIATION DOSE REDUCTION: This exam was performed according to the departmental dose-optimization program which includes automated exposure control, adjustment of the mA and/or kV according to patient size and/or use of iterative reconstruction technique. COMPARISON:  CT head without contrast 05/15/2022 at 5:23 p.m. FINDINGS: Brain: Interval progression of loss of gray-white differentiation involves the posterior left insular cortex the posterior left superior temporal gyrus and left parietal lobe. Sulci are effaced. No acute hemorrhage is present. Basal ganglia are intact. Scattered subcortical T2 hyperintensities are otherwise stable. The brainstem and cerebellum are within normal limits. Vascular: Atherosclerotic calcifications are present within the cavernous internal carotid arteries bilaterally. Hyperdense left M2 branches are noted. Skull: Calvarium is intact. No focal lytic or blastic lesions are present. No significant extracranial soft tissue lesion is present. Sinuses/Orbits: Right lens replacement is present. Globes and orbits are otherwise within normal limits. The paranasal sinuses and mastoid air cells are clear. ASPECTS [facility] Stroke Program Early CT Score) - Ganglionic level infarction (caudate, lentiform nuclei, internal capsule, insula, M1-M3 cortex): 5/7 - Supraganglionic infarction (M4-M6 cortex): 2/3 Total score (0-10 with 10 being normal): 7/10 IMPRESSION: 1. Interval progression of loss of gray-white differentiation involving the posterior left insular cortex the posterior left superior temporal gyrus and left parietal lobe compatible with acute/subacute infarct. 2. Hyperdense left M2 branches compatible with acute thrombus. 3. ASPECTS is 7/10. 4. Stable atrophy and white matter disease. This likely reflects the sequela of chronic microvascular ischemia. The above was relayed via text pager to Dr. Lesleigh Noe on 05/15/2022 at 21:43 . Electronically Signed: By: San Morelle M.D. On: 05/15/2022 21:43   DG Chest Portable 1 View  Result Date: 05/15/2022 CLINICAL DATA:  Altered mental status EXAM: PORTABLE CHEST 1 VIEW COMPARISON:  Chest x-ray dated June 23, 2021 FINDINGS: The heart size and mediastinal contours are within normal limits. Both lungs are clear. The visualized skeletal structures are unremarkable. IMPRESSION: No active disease. Electronically Signed   By: Yetta Glassman M.D.   On: 05/15/2022 17:47   CT HEAD WO CONTRAST (5MM)  Result Date: 05/15/2022 CLINICAL DATA:  Neuro deficit. Suspected stroke. RIGHT-sided weakness. RIGHT-sided facial droop. EXAM: CT HEAD WITHOUT CONTRAST TECHNIQUE: Contiguous axial images were obtained from the base of the skull through the vertex without intravenous contrast. RADIATION DOSE REDUCTION: This exam was performed according to the departmental dose-optimization program which includes automated exposure control, adjustment of the mA and/or kV according to patient size and/or use of iterative reconstruction technique. COMPARISON:  None Available. FINDINGS: Brain: There is central and cortical atrophy. Minimal focal areas of periventricular white matter changes are consistent with small vessel disease. There is no intra or extra-axial fluid collection or mass lesion. The basilar cisterns and ventricles have a normal appearance. There is no CT evidence for acute infarction or hemorrhage. Vascular: No hyperdense vessel or unexpected calcification. Skull: Normal. Negative for fracture or focal lesion. Sinuses/Orbits: No acute finding. Other: None. IMPRESSION: No evidence for acute intracranial abnormality. Electronically Signed   By: Nolon Nations M.D.   On: 05/15/2022 17:23    Procedures Procedures    Medications Ordered in ED Medications  sodium chloride flush (NS) 0.9 % injection 3 mL (has no administration in time range)  sodium chloride 0.9 %  bolus 1,000 mL (has no administration in time range)  ceFAZolin (ANCEF) 2-4 GM/100ML-% IVPB (has no administration in time range)  nitroGLYCERIN 100 mcg/mL intra-arterial injection (has no administration in time range)  aspirin 81 MG chewable tablet (has  no administration in time range)  ticagrelor (BRILINTA) 90 MG tablet (has no administration in time range)  aspirin chewable tablet (81 mg Per Tube Given 05/15/22 2300)  ticagrelor (BRILINTA) tablet (180 mg Per Tube Given 05/15/22 2300)   stroke: early stages of recovery book (has no administration in time range)  0.9 %  sodium chloride infusion (has no administration in time range)  acetaminophen (TYLENOL) tablet 650 mg (has no administration in time range)    Or  acetaminophen (TYLENOL) 160 MG/5ML solution 650 mg (has no administration in time range)    Or  acetaminophen (TYLENOL) suppository 650 mg (has no administration in time range)  senna-docusate (Senokot-S) tablet 1 tablet (has no administration in time range)  pantoprazole (PROTONIX) injection 40 mg (has no administration in time range)  iohexol (OMNIPAQUE) 350 MG/ML injection 100 mL (100 mLs Intravenous Contrast Given 05/15/22 2140)  clevidipine (CLEVIPREX) 0.5 MG/ML infusion (  Override pull for Anesthesia 05/15/22 2257)  iohexol (OMNIPAQUE) 300 MG/ML solution 100 mL (75 mLs Intra-arterial Contrast Given 05/15/22 2311)  fentaNYL (SUBLIMAZE) 100 MCG/2ML injection (  Override pull for Anesthesia 05/15/22 2349)    ED Course/ Medical Decision Making/ A&P Clinical Course as of 05/15/22 2353  Fri May 15, 2022  2157 WBC, UA(!): >50 [JS]  2158 Leukocytes,Ua(!): LARGE [JS]  2158 Budding Yeast: PRESENT [JS]    Clinical Course User Index [JS] Janeece Fitting, PA-C                           Medical Decision Making Amount and/or Complexity of Data Reviewed Labs: ordered. Decision-making details documented in ED Course. Radiology: ordered.  Risk Decision regarding  hospitalization.     This patient presents to the ED for concern of AMS, this involves a number of treatment options, and is a complaint that carries with it a high risk of complications and morbidity.  The differential diagnosis includes CVA, hemorrhage, encephalopathy versus infection.   Co morbidities: Discussed in HPI   Brief History:  Dependent living 66 year old here via EMS for altered mental status.  Noted to be acting differently by family members.  Last known normal is unknown.  Granddaughter noted patient to not be making sense.  Patient did try to use her phone today at 11:15 AM.  Here not answer any questions.  EMR reviewed including pt PMHx, past surgical history and past visits to ER.   See HPI for more details   Lab Tests:  I ordered and independently interpreted labs.  The pertinent results include:    I personally reviewed all laboratory work and imaging. Metabolic panel without any acute abnormality specifically kidney function within normal limits and no significant electrolyte abnormalities. CBC without leukocytosis or significant anemia. Patient's prior labs were obtained and they showed multiple discrepancies, some suspicion for hemolysis therefore this were redrawn.  Labs are overall within her baseline.  If acetaminophen, salicylate, ethanol.  Imaging Studies:  CT Head with no acute findings.  Xray of the chest showed no acute findings.    Cardiac Monitoring:  The patient was maintained on a cardiac monitor.  I personally viewed and interpreted the cardiac monitored which showed an underlying rhythm of: Sinus tachycardia 112 EKG non-ischemic  Consults:  I requested consultation with Neurology Dr. Rory Percy,  and discussed lab and imaging findings as well as pertinent plan - they recommend: not code stroke at this time but will need to do workup for CVA. 9:14 PM Second consultation  with Dr. Maurine Minister who recommended perfusion  study.  Reevaluation:  After the interventions noted above I re-evaluated patient and found that they have :stayed the same   Social Determinants of Health:  The patient's social determinants of health were a factor in the care of this patient    Problem List / ED Course:  Patient arrived in the ED for altered mental status, according to EMS report.  Patient was found at home, after son called the ambulance as patient did not appear normal.  According to EMS, patient had spoken to granddaughter earlier and seemed to be somewhat different.  Therefore son called EMS, however he had not been at the scene as he was at work. Patient's last known normal is unknown, when she arrived into the ED she was noted to have facial droop, and right upper and lower weakness. Although on exam, she was moving her arms and legs when not asked to do so. Patient is not answering questions aside from saying "she has to use the bathroom" and trying to hold her right arm with her left hand. She was evaluated upon arrival from EMS by myself and Dr. Theora Gianotti, as she did not know last known normal, and no history obtained from family she was not made a code stroke.  Workup for focus on neurological versus metabolic component.  CT was obtained on arrival which did not show any acute infarct at the time.  Labs originally hemolyzed with abnormal labs throughout, therefore repeat lab was obtained.  CMP is overall benign.  CBC benign as well, H&H was repeated due to low hemoglobin reported, this was then within normal limits.  Magnesium slightly decreased.  Patient continued to be altered, I spoke to son at the bedside and he was in formal all results that have been obtained such as CBC, CMP, CT head along with chest x-ray.  I did discuss with him that patient will likely need admission for further work-up as patient has not returned back to baseline. During the course of the ED patient remained hemodynamically stable, however  mental status did not improve during her stay, it continued to be altered.  I did ask him to identify her son however she did not really respond.  Did have a high suspicion for UTI causing some delirium however right arm and right leg weakness where concerning for stroke.  I reconsulted Dr. Curly Shores of neurology in order to obtain further recommendations, she recommended a CT angio perfusion study.  At the same time urine was resolved did which showed a white blood cell count of 50,000, large leukocytes concerning for infection in the setting of change in mental status.  Patient was taken back to CT perfusion study was obtained and CT was added to show cute versus subacute infarct.  Patient was made a code IR.  Dispostion:  Patient made a code IR and taken for intervention by neurology for further management.     Portions of this note were generated with [employer]. Dictation errors may occur despite best attempts at proofreading.   Final Clinical Impression(s) / ED Diagnoses Final diagnoses:  Altered mental status, unspecified altered mental status type    Rx / DC Orders ED Discharge Orders     None         Janeece Fitting, PA-C 05/15/22 2353    Blanchie Dessert, MD 05/16/22 8115    Blanchie Dessert, MD 05/16/22 [phone]

## 2022-05-15 NOTE — Anesthesia Procedure Notes (Signed)
Arterial Line Insertion Start/End8/25/2023 10:30 PM, 05/15/2022 10:35 PM Performed by: Suzy Bouchard, CRNA, CRNA  Patient location: OOR procedure area. Emergency situation Lidocaine 1% used for infiltration Left, radial was placed Catheter size: 20 G Hand hygiene performed , maximum sterile barriers used  and Seldinger technique used Allen's test indicative of satisfactory collateral circulation Attempts: 1 Procedure performed without using ultrasound guided technique. Following insertion, dressing applied and Biopatch. Post procedure assessment: normal  Patient tolerated the procedure well with no immediate complications.

## 2022-05-15 NOTE — H&P (Signed)
Neurology H&P  CC: Altered mental status  History is obtained from: EDP, family, chart review   HPI: Bailey [name] is a 66 y.o. female with past medical history significant for hypertension, hyperlipidemia, diabetes, tobacco abuse.  She last talked to family several days before admission.  Symptom discovery was at 2 PM on 8/26, and she was triaged in the ED at 4:41 PM.  Case was discussed with neurology by ED provider and code stroke was not activated due to unknown last known well.  Initial head CT was read with no acute intracranial process, initial labs were all highly abnormal  Her initial examination for ED provider was notable for fluctuating right-sided weakness.  She was making repetitive statements ("I can't talk") and not following commands  On my review of the case, initial head CT was concerning for hyperdense left MCA vessel sign with aspects of 10 and therefore I assessed the patient for potential intra-arterial thrombectomy emergently  LKW: 2 days prior to admission tPA given?: No, out of the window IA performed?:  Yes, delayed due to being out of the window, and initial labs demonstrating multiple metabolic abnormalities, initial waxing and waning symptoms leading to delayed neurology evaluation with advanced imaging demonstrating favorable profile for intervention Premorbid modified rankin scale: 0-1     0 - No symptoms.     1 - No significant disability. Able to carry out all usual activities, despite some symptoms.  ROS: Unable to obtain due to altered mental status.   Past Medical History:  Diagnosis Date   Arthritis    Cataract    COPD (chronic obstructive pulmonary disease) ([place])    Diabetes mellitus without complication ([place])    Fibrocystic breast disease July 2016   GERD (gastroesophageal reflux disease)    Hyperlipidemia    Hypertension    Past Surgical History:  Procedure Laterality Date   BREAST BIOPSY Right 04/19/2015   benign   BREAST BIOPSY  Right 11/01/2020   fibroadenoma   CATARACT EXTRACTION     CHOLECYSTECTOMY     COLONOSCOPY     GALLBLADDER SURGERY     UTERINE FIBROID SURGERY     Family History  Problem Relation Age of Onset   Diabetes Mother    Stroke Mother    Dementia Mother    Heart disease Father    Diabetes Father    Diabetes Brother    Dementia Brother    Renal Disease Brother    Diabetes Sister    Narcolepsy Sister    Arthritis Sister    Diabetes Sister    Obesity Sister    Diabetes Brother    Heart disease Brother    Cancer Brother        pancreatic   Diabetes Brother    Diabetes Brother    Stomach cancer Sister    Colon cancer Neg Hx    Esophageal cancer Neg Hx    Rectal cancer Neg Hx    Current Outpatient Medications  Medication Instructions   acetaminophen (TYLENOL) 1,000 mg, Oral, Every 6 hours PRN, Reported on 04/08/2016   albuterol (VENTOLIN HFA) 108 (90 Base) MCG/ACT inhaler 2 puffs, Inhalation, Every 6 hours PRN   alendronate (FOSAMAX) 70 mg, Oral, Every 7 days, Take with a full glass of water on an empty stomach.   atorvastatin (LIPITOR) 40 MG tablet TAKE 1 TABLET EVERY DAY   blood glucose meter kit and supplies KIT Dispense based on patient and insurance preference. Use up to four times daily  as directed. (FOR ICD-9 250.00, 250.01).   Blood Glucose Monitoring Suppl (ACCU-CHEK GUIDE ME) w/Device KIT Use to check blood sugar once a day. Dx code: E11.9   Boric Acid Vaginal (AZO BORIC ACID) 600 MG SUPP 1 suppository, Vaginal, Daily at bedtime   butalbital-acetaminophen-caffeine (FIORICET) 50-325-40 MG tablet 1-2 tablets, Oral, Every 6 hours PRN, Max 6 per day   canagliflozin (INVOKANA) 100 mg, Oral, Daily before breakfast   cholestyramine (QUESTRAN) 4 g packet DISSOLVE & TAKE 1 POWDER PACKET BY MOUTH TWICE DAILY   cyclobenzaprine (FLEXERIL) 5 mg, Oral, Daily at bedtime, Use as needed for neck pain   dicyclomine (BENTYL) 20 mg, Oral, Every 4 hours   glimepiride (AMARYL) 4 MG tablet TAKE 2  TABLETS EVERY DAY WITH BREAKFAST   glucose blood (ACCU-CHEK GUIDE) test strip USE ONE TIME DAILY TO TEST BLOOD SUGAR   lisinopril (ZESTRIL) 40 mg, Oral, Daily   metFORMIN (GLUCOPHAGE) 1,000 mg, Oral, 2 times daily with meals   pantoprazole (PROTONIX) 40 mg, Oral, 2 times daily   triamcinolone cream (KENALOG) 0.1 % 1 application , Topical, 2 times daily, Use as needed on dry or scaly areas   Social History:  reports that she has been smoking cigarettes. She has a 34.00 pack-year smoking history. She has been exposed to tobacco smoke. She has never used smokeless tobacco. She reports current alcohol use. She reports that she does not use drugs.  Exam: Current vital signs: BP (!) 195/101   Pulse 99   Temp 99.2 F (37.3 C) (Oral)   Resp 18   SpO2 99%  Vital signs in last 24 hours: Temp:  [99.2 F (37.3 C)] 99.2 F (37.3 C) (08/25 1948) Pulse Rate:  [84-113] 99 (08/25 2206) Resp:  [15-19] 18 (08/25 2206) BP: (174-196)/(74-157) 195/101 (08/25 2200) SpO2:  [97 %-100 %] 99 % (08/25 2100)   Physical Exam  Constitutional: Appears well-developed and well-nourished.  Psych: Affect flat, not interactive with examiner Eyes: No scleral injection HENT: No oropharyngeal obstruction.  MSK: no joint deformities.  Cardiovascular: Normal rate and regular rhythm. Perfusing extremities well Respiratory: Effort normal, non-labored breathing GI: Soft.  No distension. There is no tenderness.  Skin: Warm dry and intact visible skin  Neuro: Mental Status: Patient is awake, alert, utters some incomprehensible sounds, does not follow any commands, has neglect of the right side Cranial Nerves: II: Visual Fields are notable for right hemianopia. Pupils are equal, round, and reactive to light.   III,IV, VI: EOMI with left gaze preference but can look to the right V: Facial sensation is symmetric to light eyelash brush VII: Facial movement is notable for right facial droop.  VIII: hearing is intact to  voice Remainder unable to assess due to mental status Motor: She has trace movement in the right upper extremity, right lower extremity triple flexion.  She moves that left side spontaneously antigravity and is at least 4/5 Sensory: Reduced sensation in the right upper and lower extremity Plantar reflexes: Upgoing on the right and downgoing on the left Cerebellar: Unable to assess secondary to patient's mental status  Gait:  Deferred for patient safety  NIHSS total 22 Score breakdown: 2 points for not following commands, 2 points for not answering questions, one-point for gaze palsy, 2 points for right hemianopia, 2 points for right facial droop, 3 points for right upper extremity weakness, one-point for left lower extremity weakness, 3 points for right lower extremity weakness, one-point for sensory loss on the right side, 2 points for  severe aphasia, 2 points for severe dysarthria, one-point for neglect of the right side Performed at 21:25   I have reviewed labs in epic and the results pertinent to this consultation are:  Basic Metabolic Panel: Recent Labs  Lab 05/15/22 1656 05/15/22 1703 05/15/22 1824  NA 144 146* 137  K <2.0* 2.6* 4.3  CL 129* 116* 107  CO2 12*  --  20*  GLUCOSE 79 88 130*  BUN 5* 6* 10  CREATININE <0.30* <0.20* 0.62  CALCIUM 4.3*  --  9.8  MG  --   --  1.5*    CBC: Recent Labs  Lab 05/15/22 1656 05/15/22 1703 05/15/22 1824  WBC 6.9  --   --   NEUTROABS 5.5  --   --   HGB 6.1* 8.8* 12.5  HCT 20.3* 26.0* 41.1  MCV 74.4*  --   --   PLT 158  --   --     Coagulation Studies: Recent Labs    05/15/22 1656  LABPROT 13.2  INR 1.0      I have reviewed the images obtained:  Initial head CT personally reviewed, radiology read negative for acute intracranial process but possible left MCA hyperdense vessel on my read  Repeat head CT personally reviewed, agree with radiology: 1. Interval progression of loss of gray-white  differentiation involving the posterior left insular cortex the posterior left superior temporal gyrus and left parietal lobe compatible with acute/subacute infarct. 2. Hyperdense left M2 branches compatible with acute thrombus. 3. ASPECTS is 6/10. [Initially reported as 7 out of 10] 4. Stable atrophy and white matter disease. This likely reflects the sequela of chronic microvascular ischemia.   CTA/CTP personally reviewed, agree with radiology:   1. 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 [Approximately 60 mL additional tissue at risk with a mismatch ratio of 2] 2. The posterior left M2 segment is occluded with poor collaterals. 3. 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 4. High-grade stenosis or occlusion of the left P2 segment. 5. The left A1 segment is narrowed. 6. Atherosclerotic changes within the cavernous right ICA without significant stenosis. 7. Narrowing of less than 50% at the origin of the left common carotid artery. 8. 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 9. Aortic Atherosclerosis (ICD10-I70.0).  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  Discussed with son that overall this condition has high risk of mortality and the patient has already had significant injury.  Intervention was offered with the intention of minimizing injury and reducing risk of death though it was discussed that the intervention itself does carry risk of bleeding and death.  Son expressed understanding of the risks and benefits and consented for the procedure  Recommendations: # Left carotid occlusion with left MCA stroke - Stroke labs HgbA1c, fasting lipid panel - MRI brain when stabilized (to be ordered) - Frequent neuro checks -  Echocardiogram - Antiplatelets as needed per neuro IR - Risk factor modification - Telemetry monitoring; 30 day event monitor on discharge if no arrythmias captured  - Blood pressure goal   - Post successful uncomplicated revascularization SBP 120 - 140 for 24 hours; if complications have arisen or only partial revascularization reach out to interventionalist or neurologist on call for BP goal - PT consult, OT consult, Speech  consult - Admitted to stroke team  Lesleigh Noe MD-PhD [facility] [phone] Available 7 PM to 7 AM, outside of these hours please call Neurologist on call as listed on Amion.   Total critical care time: 75 minutes   Critical care time was exclusive of separately billable procedures and treating other patients.   Critical care was necessary to treat or prevent imminent or life-threatening deterioration.   Critical care was time spent personally by me on the following activities: development of treatment plan with patient and/or surrogate as well as nursing, discussions with consultants/primary team, evaluation of patient's response to treatment, examination of patient, obtaining history from patient or surrogate, ordering and performing treatments and interventions, ordering and review of laboratory studies, ordering and review of radiographic studies, and re-evaluation of patient's condition as needed, as documented above.

## 2022-05-15 NOTE — Anesthesia Preprocedure Evaluation (Addendum)
Anesthesia Evaluation  Patient identified by MRN, date of birth, ID band Patient confused  General Assessment Comment: Aphasic, leftward gaze   Reviewed: Allergy & Precautions, Patient's Chart, lab work & pertinent test resultsPreop documentation limited or incomplete due to emergent nature of procedure.  History of Anesthesia Complications Negative for: history of anesthetic complications  Airway Mallampati: Unable to assess  TM Distance: >3 FB     Dental   Pulmonary COPD, Current Smoker,    Pulmonary exam normal        Cardiovascular hypertension, Pt. on medications + Peripheral Vascular Disease  Normal cardiovascular exam   '19 Myoperfusion - Nuclear stress EF: 52%. Visually, the LV EF appears to be greater than the 52% calculated by the computer. The EF appears to be normal . There was no ST segment deviation noted during stress. This is a low risk study. There is no evidence of ischemia and no evidence of previous infarction The study is normal.     Neuro/Psych CVA, Residual Symptoms    GI/Hepatic GERD  Medicated,  Endo/Other  diabetes, Type 2, Oral Hypoglycemic Agents Obesity   Renal/GU      Musculoskeletal  (+) Arthritis ,   Abdominal   Peds  Hematology  Delta beta thalassemia     Anesthesia Other Findings CTA Head/Neck - IMPRESSION: 1. 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 2. The posterior left M2 segment is occluded with poor collaterals. 3. 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 4. High-grade stenosis or occlusion of the left P2 segment. 5. The left A1 segment is narrowed. 6. Atherosclerotic changes within the cavernous right ICA without significant stenosis. 7. Narrowing of less than 50% at the origin of the left common carotid  artery. 8. 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 9. Aortic Atherosclerosis (ICD10-I70.0).   Reproductive/Obstetrics                            Anesthesia Physical Anesthesia Plan  ASA: 4 and emergent  Anesthesia Plan: General   Post-op Pain Management: Minimal or no pain anticipated   Induction: Intravenous and Rapid sequence  PONV Risk Score and Plan: 3 and Treatment may vary due to age or medical condition and Ondansetron  Airway Management Planned: Oral ETT  Additional Equipment: Arterial line  Intra-op Plan:   Post-operative Plan: Possible Post-op intubation/ventilation  Informed Consent:     Only emergency history available and History available from chart only  Plan Discussed with: CRNA and Anesthesiologist  Anesthesia Plan Comments:        Anesthesia Quick Evaluation

## 2022-05-16 ENCOUNTER — Inpatient Hospital Stay (HOSPITAL_COMMUNITY): Payer: Medicare HMO

## 2022-05-16 ENCOUNTER — Other Ambulatory Visit (HOSPITAL_COMMUNITY): Payer: Medicare HMO

## 2022-05-16 DIAGNOSIS — I6602 Occlusion and stenosis of left middle cerebral artery: Secondary | ICD-10-CM | POA: Diagnosis present

## 2022-05-16 DIAGNOSIS — I63232 Cerebral infarction due to unspecified occlusion or stenosis of left carotid arteries: Secondary | ICD-10-CM | POA: Diagnosis not present

## 2022-05-16 DIAGNOSIS — I6389 Other cerebral infarction: Secondary | ICD-10-CM | POA: Diagnosis not present

## 2022-05-16 HISTORY — PX: IR INTRAVSC STENT CERV CAROTID W/O EMB-PROT MOD SED INC ANGIO: IMG2304

## 2022-05-16 HISTORY — PX: IR ANGIO VERTEBRAL SEL VERTEBRAL UNI L MOD SED: IMG5367

## 2022-05-16 HISTORY — PX: IR PERCUTANEOUS ART THROMBECTOMY/INFUSION INTRACRANIAL INC DIAG ANGIO: IMG6087

## 2022-05-16 HISTORY — PX: IR CT HEAD LTD: IMG2386

## 2022-05-16 HISTORY — PX: IR ANGIO INTRA EXTRACRAN SEL COM CAROTID INNOMINATE UNI R MOD SED: IMG5359

## 2022-05-16 LAB — LIPID PANEL
Cholesterol: 131 mg/dL (ref 0–200)
HDL: 36 mg/dL — ABNORMAL LOW (ref >40)
LDL Cholesterol: 25 mg/dL (ref 0–99)
Total CHOL/HDL Ratio: 3.6 RATIO
Triglycerides: 349 mg/dL — ABNORMAL HIGH (ref <150)
VLDL: 70 mg/dL — ABNORMAL HIGH (ref 0–40)

## 2022-05-16 LAB — RAPID URINE DRUG SCREEN, HOSP PERFORMED
Amphetamines: NOT DETECTED
Barbiturates: NOT DETECTED
Benzodiazepines: NOT DETECTED
Cocaine: NOT DETECTED
Opiates: NOT DETECTED
Tetrahydrocannabinol: NOT DETECTED

## 2022-05-16 LAB — CBC WITH DIFFERENTIAL/PLATELET
Abs Immature Granulocytes: 0.06 10*3/uL (ref 0.00–0.07)
Basophils Absolute: 0.1 10*3/uL (ref 0.0–0.1)
Basophils Relative: 0 %
Eosinophils Absolute: 0.1 10*3/uL (ref 0.0–0.5)
Eosinophils Relative: 1 %
HCT: 32.5 % — ABNORMAL LOW (ref 36.0–46.0)
Hemoglobin: 10.7 g/dL — ABNORMAL LOW (ref 12.0–15.0)
Immature Granulocytes: 1 %
Lymphocytes Relative: 19 %
Lymphs Abs: 2.3 10*3/uL (ref 0.7–4.0)
MCH: 22.6 pg — ABNORMAL LOW (ref 26.0–34.0)
MCHC: 32.9 g/dL (ref 30.0–36.0)
MCV: 68.7 fL — ABNORMAL LOW (ref 80.0–100.0)
Monocytes Absolute: 0.8 10*3/uL (ref 0.1–1.0)
Monocytes Relative: 7 %
Neutro Abs: 8.8 10*3/uL — ABNORMAL HIGH (ref 1.7–7.7)
Neutrophils Relative %: 72 %
Platelets: 272 10*3/uL (ref 150–400)
RBC: 4.73 MIL/uL (ref 3.87–5.11)
RDW: 18.3 % — ABNORMAL HIGH (ref 11.5–15.5)
WBC: 12.1 10*3/uL — ABNORMAL HIGH (ref 4.0–10.5)
nRBC: 0 % (ref 0.0–0.2)

## 2022-05-16 LAB — POCT I-STAT 7, (LYTES, BLD GAS, ICA,H+H)
Acid-base deficit: 4 mmol/L — ABNORMAL HIGH (ref 0.0–2.0)
Bicarbonate: 21 mmol/L (ref 20.0–28.0)
Calcium, Ion: 1.21 mmol/L (ref 1.15–1.40)
HCT: 33 % — ABNORMAL LOW (ref 36.0–46.0)
Hemoglobin: 11.2 g/dL — ABNORMAL LOW (ref 12.0–15.0)
O2 Saturation: 97 %
Patient temperature: 98.6
Potassium: 3.7 mmol/L (ref 3.5–5.1)
Sodium: 138 mmol/L (ref 135–145)
TCO2: 22 mmol/L (ref 22–32)
pCO2 arterial: 35.9 mmHg (ref 32–48)
pH, Arterial: 7.376 (ref 7.35–7.45)
pO2, Arterial: 89 mmHg (ref 83–108)

## 2022-05-16 LAB — COMPREHENSIVE METABOLIC PANEL
ALT: 16 U/L (ref 0–44)
AST: 13 U/L — ABNORMAL LOW (ref 15–41)
Albumin: 2.9 g/dL — ABNORMAL LOW (ref 3.5–5.0)
Alkaline Phosphatase: 71 U/L (ref 38–126)
Anion gap: 9 (ref 5–15)
BUN: 7 mg/dL — ABNORMAL LOW (ref 8–23)
CO2: 20 mmol/L — ABNORMAL LOW (ref 22–32)
Calcium: 8.5 mg/dL — ABNORMAL LOW (ref 8.9–10.3)
Chloride: 107 mmol/L (ref 98–111)
Creatinine, Ser: 0.54 mg/dL (ref 0.44–1.00)
GFR, Estimated: >60 mL/min (ref >60)
Glucose, Bld: 214 mg/dL — ABNORMAL HIGH (ref 70–99)
Potassium: 3.5 mmol/L (ref 3.5–5.1)
Sodium: 136 mmol/L (ref 135–145)
Total Bilirubin: 0.5 mg/dL (ref 0.3–1.2)
Total Protein: 5.7 g/dL — ABNORMAL LOW (ref 6.5–8.1)

## 2022-05-16 LAB — PHOSPHORUS: Phosphorus: 3.6 mg/dL (ref 2.5–4.6)

## 2022-05-16 LAB — ECHOCARDIOGRAM COMPLETE
AR max vel: 2.52 cm2
AV Area VTI: 2.5 cm2
AV Area mean vel: 2.48 cm2
AV Mean grad: 5 mmHg
AV Peak grad: 10.4 mmHg
Ao pk vel: 1.61 m/s
Area-P 1/2: 3.17 cm2
Height: 65 in
S' Lateral: 2.4 cm
Weight: 3068.8 oz

## 2022-05-16 LAB — HIV ANTIBODY (ROUTINE TESTING W REFLEX): HIV Screen 4th Generation wRfx: NONREACTIVE

## 2022-05-16 LAB — MRSA NEXT GEN BY PCR, NASAL: MRSA by PCR Next Gen: NOT DETECTED

## 2022-05-16 LAB — GLUCOSE, CAPILLARY
Glucose-Capillary: 186 mg/dL — ABNORMAL HIGH (ref 70–99)
Glucose-Capillary: 200 mg/dL — ABNORMAL HIGH (ref 70–99)
Glucose-Capillary: 205 mg/dL — ABNORMAL HIGH (ref 70–99)
Glucose-Capillary: 223 mg/dL — ABNORMAL HIGH (ref 70–99)
Glucose-Capillary: 171 mg/dL — ABNORMAL HIGH (ref 70–99)
Glucose-Capillary: 194 mg/dL — ABNORMAL HIGH (ref 70–99)

## 2022-05-16 LAB — MAGNESIUM: Magnesium: 1.7 mg/dL (ref 1.7–2.4)

## 2022-05-16 LAB — CALCIUM, IONIZED: Calcium, Ionized, Serum: 5.5 mg/dL (ref 4.5–5.6)

## 2022-05-16 MED ORDER — LISINOPRIL 20 MG PO TABS
40.0000 mg | ORAL_TABLET | Freq: Every day | ORAL | Status: DC
Start: 2022-05-16 — End: 2022-05-29
  Administered 2022-05-16 – 2022-05-28 (×13): 40 mg
  Filled 2022-05-16 (×13): qty 2

## 2022-05-16 MED ORDER — POLYETHYLENE GLYCOL 3350 17 G PO PACK
17.0000 g | PACK | Freq: Every day | ORAL | Status: DC
  Administered 2022-05-16 – 2022-05-18 (×3): 17 g
  Filled 2022-05-16 (×3): qty 1

## 2022-05-16 MED ORDER — CLEVIDIPINE BUTYRATE 0.5 MG/ML IV EMUL
0.0000 mg/h | INTRAVENOUS | Status: DC
  Administered 2022-05-16: 20 mg/h via INTRAVENOUS
  Administered 2022-05-16: 16 mg/h via INTRAVENOUS
  Administered 2022-05-16 (×2): 21 mg/h via INTRAVENOUS
  Administered 2022-05-16: 14 mg/h via INTRAVENOUS
  Administered 2022-05-16: 18 mg/h via INTRAVENOUS
  Administered 2022-05-16: 12 mg/h via INTRAVENOUS
  Administered 2022-05-16: 18 mg/h via INTRAVENOUS
  Administered 2022-05-16: 12 mg/h via INTRAVENOUS
  Administered 2022-05-16: 14 mg/h via INTRAVENOUS
  Administered 2022-05-16: 12 mg/h via INTRAVENOUS
  Administered 2022-05-16: 21 mg/h via INTRAVENOUS
  Filled 2022-05-16 (×11): qty 50

## 2022-05-16 MED ORDER — DOCUSATE SODIUM 50 MG/5ML PO LIQD
100.0000 mg | Freq: Two times a day (BID) | ORAL | Status: DC
  Administered 2022-05-16 – 2022-05-23 (×12): 100 mg
  Filled 2022-05-16 (×16): qty 10

## 2022-05-16 MED ORDER — AMLODIPINE BESYLATE 10 MG PO TABS: 10.0000 mg | ORAL_TABLET | Freq: Every day | ORAL | Status: DC

## 2022-05-16 MED ORDER — MAGNESIUM SULFATE 4 GM/100ML IV SOLN
4.0000 g | Freq: Once | INTRAVENOUS | Status: AC
  Administered 2022-05-16: 4 g via INTRAVENOUS
  Filled 2022-05-16: qty 100

## 2022-05-16 MED ORDER — ASPIRIN 81 MG PO CHEW
81.0000 mg | CHEWABLE_TABLET | Freq: Every day | ORAL | Status: DC
  Administered 2022-05-17 – 2022-05-22 (×6): 81 mg
  Filled 2022-05-16 (×9): qty 1

## 2022-05-16 MED ORDER — TICAGRELOR 90 MG PO TABS
90.0000 mg | ORAL_TABLET | Freq: Two times a day (BID) | ORAL | Status: DC
  Administered 2022-05-16 – 2022-05-24 (×16): 90 mg
  Filled 2022-05-16 (×20): qty 1

## 2022-05-16 MED ORDER — CANGRELOR TETRASODIUM 50 MG IV SOLR
INTRAVENOUS | Status: AC
  Filled 2022-05-16: qty 50

## 2022-05-16 MED ORDER — FENTANYL CITRATE PF 50 MCG/ML IJ SOSY
25.0000 ug | PREFILLED_SYRINGE | INTRAMUSCULAR | Status: DC | PRN
  Filled 2022-05-16: qty 1

## 2022-05-16 MED ORDER — ORAL CARE MOUTH RINSE: 15.0000 mL | OROMUCOSAL | Status: DC | PRN

## 2022-05-16 MED ORDER — IOHEXOL 300 MG/ML  SOLN
100.0000 mL | Freq: Once | INTRAMUSCULAR | Status: AC | PRN
  Administered 2022-05-16: 50 mL via INTRA_ARTERIAL

## 2022-05-16 MED ORDER — ACETAMINOPHEN 160 MG/5ML PO SOLN
650.0000 mg | ORAL | Status: DC | PRN
Start: 2022-05-16 — End: 2022-05-29
  Administered 2022-05-18 – 2022-05-23 (×4): 650 mg
  Filled 2022-05-16 (×4): qty 20.3

## 2022-05-16 MED ORDER — PROPOFOL 1000 MG/100ML IV EMUL
0.0000 ug/kg/min | INTRAVENOUS | Status: DC
  Administered 2022-05-16: 10 ug/kg/min via INTRAVENOUS
  Administered 2022-05-16: 35 ug/kg/min via INTRAVENOUS
  Administered 2022-05-16: 50 ug/kg/min via INTRAVENOUS
  Filled 2022-05-16 (×3): qty 100

## 2022-05-16 MED ORDER — ACETAMINOPHEN 325 MG PO TABS
650.0000 mg | ORAL_TABLET | ORAL | Status: DC | PRN
Start: 2022-05-16 — End: 2022-05-29
  Administered 2022-05-25: 650 mg via ORAL
  Filled 2022-05-16 (×2): qty 2

## 2022-05-16 MED ORDER — ORAL CARE MOUTH RINSE
15.0000 mL | OROMUCOSAL | Status: DC
  Administered 2022-05-16 – 2022-05-18 (×20): 15 mL via OROMUCOSAL

## 2022-05-16 MED ORDER — CLEVIDIPINE BUTYRATE 0.5 MG/ML IV EMUL: 0.0000 mg/h | INTRAVENOUS | Status: DC

## 2022-05-16 MED ORDER — FENTANYL CITRATE PF 50 MCG/ML IJ SOSY
25.0000 ug | PREFILLED_SYRINGE | INTRAMUSCULAR | Status: DC | PRN
  Filled 2022-05-16: qty 2

## 2022-05-16 MED ORDER — TICAGRELOR 90 MG PO TABS
90.0000 mg | ORAL_TABLET | Freq: Two times a day (BID) | ORAL | Status: DC
  Administered 2022-05-23 – 2022-05-28 (×9): 90 mg via ORAL
  Filled 2022-05-16 (×11): qty 1

## 2022-05-16 MED ORDER — ACETAMINOPHEN 650 MG RE SUPP: 650.0000 mg | RECTAL | Status: DC | PRN

## 2022-05-16 MED ORDER — CLEVIDIPINE BUTYRATE 0.5 MG/ML IV EMUL
0.0000 mg/h | INTRAVENOUS | Status: DC
  Administered 2022-05-16: 14 mg/h via INTRAVENOUS
  Administered 2022-05-17: 4 mg/h via INTRAVENOUS
  Filled 2022-05-16 (×2): qty 100

## 2022-05-16 MED ORDER — ASPIRIN 81 MG PO CHEW
81.0000 mg | CHEWABLE_TABLET | Freq: Every day | ORAL | Status: DC
  Administered 2022-05-23 – 2022-05-28 (×6): 81 mg via ORAL
  Filled 2022-05-16 (×7): qty 1

## 2022-05-16 MED ORDER — IOHEXOL 300 MG/ML  SOLN
100.0000 mL | Freq: Once | INTRAMUSCULAR | Status: AC | PRN
  Administered 2022-05-16: 25 mL via INTRA_ARTERIAL

## 2022-05-16 MED ORDER — INSULIN ASPART 100 UNIT/ML IJ SOLN
0.0000 [IU] | INTRAMUSCULAR | Status: DC
  Administered 2022-05-16: 2 [IU] via SUBCUTANEOUS
  Administered 2022-05-16 (×3): 1 [IU] via SUBCUTANEOUS
  Administered 2022-05-16: 2 [IU] via SUBCUTANEOUS
  Administered 2022-05-17 (×3): 1 [IU] via SUBCUTANEOUS
  Administered 2022-05-18: 2 [IU] via SUBCUTANEOUS
  Administered 2022-05-18 (×2): 1 [IU] via SUBCUTANEOUS
  Administered 2022-05-18 (×2): 2 [IU] via SUBCUTANEOUS
  Administered 2022-05-18: 1 [IU] via SUBCUTANEOUS
  Administered 2022-05-19: 2 [IU] via SUBCUTANEOUS
  Administered 2022-05-19 (×2): 3 [IU] via SUBCUTANEOUS

## 2022-05-16 MED ORDER — POTASSIUM CHLORIDE 20 MEQ PO PACK
40.0000 meq | PACK | Freq: Once | ORAL | Status: AC
  Administered 2022-05-16: 40 meq
  Filled 2022-05-16: qty 2

## 2022-05-16 MED ORDER — SODIUM CHLORIDE 0.9 % IV SOLN: INTRAVENOUS | Status: DC

## 2022-05-16 MED ORDER — CEFAZOLIN SODIUM-DEXTROSE 2-3 GM-%(50ML) IV SOLR
INTRAVENOUS | Status: DC | PRN
  Administered 2022-05-15: 2 g via INTRAVENOUS

## 2022-05-16 MED ORDER — SODIUM CHLORIDE 0.9 % IV SOLN
INTRAVENOUS | Status: AC | PRN
  Administered 2022-05-16: 2 ug/kg/min via INTRAVENOUS

## 2022-05-16 MED ORDER — PROPOFOL 500 MG/50ML IV EMUL
INTRAVENOUS | Status: DC | PRN
  Administered 2022-05-16: 50 ug/kg/min via INTRAVENOUS

## 2022-05-16 MED ORDER — LABETALOL HCL 5 MG/ML IV SOLN
20.0000 mg | INTRAVENOUS | Status: DC | PRN
  Administered 2022-05-16 – 2022-05-19 (×15): 20 mg via INTRAVENOUS
  Filled 2022-05-16 (×16): qty 4

## 2022-05-16 MED ORDER — CANGRELOR BOLUS VIA INFUSION
INTRAVENOUS | Status: AC | PRN
  Administered 2022-05-16: 1339.5 ug via INTRAVENOUS

## 2022-05-16 MED ORDER — FENTANYL 2500MCG IN NS 250ML (10MCG/ML) PREMIX INFUSION
0.0000 ug/h | INTRAVENOUS | Status: DC
  Administered 2022-05-16: 25 ug/h via INTRAVENOUS
  Filled 2022-05-16: qty 250

## 2022-05-16 MED ORDER — CHLORHEXIDINE GLUCONATE CLOTH 2 % EX PADS
6.0000 | MEDICATED_PAD | Freq: Every day | CUTANEOUS | Status: DC
  Administered 2022-05-17 – 2022-05-28 (×11): 6 via TOPICAL

## 2022-05-16 NOTE — Progress Notes (Signed)
Pt to CT and back to 7P39 without complications.

## 2022-05-16 NOTE — Progress Notes (Signed)
Patient on nasal cannula 4L and oxygen saturation 100%.

## 2022-05-16 NOTE — Progress Notes (Signed)
eLink Physician-Brief Progress Note Patient Name: Bailey [name] DOB: 04-Apr-1956 MRN: [id number]   Date of Service  05/16/2022  HPI/Events of Note  Pt has self extubated.  On minimal vent settings at the time (FiO2 30%, PEEP 5)  eICU Interventions  Sedation has been held. Pt awake.  Demonstrates intact gag and cough reflexes. No stridor or increased WOB noted.   Presently on NRB.  Will downtitrate FiO2 as tolerated.         [place] 05/16/2022, 11:39 PM

## 2022-05-16 NOTE — Progress Notes (Signed)
eLink Physician-Brief Progress Note Patient Name: Damaya Channing DOB: 04-05-1956 MRN: [id number]   Date of Service  05/16/2022  HPI/Events of Note  Patient admitted to 27 N s/p IR thrombectomy  of left MCA territory thrombus causing ischemic stroke. She was left intubated, sedated, and mechanically ventilated.  eICU Interventions  New Patient Evaluation.        Kerry Kass Kentrel Clevenger 05/16/2022, 2:19 AM

## 2022-05-16 NOTE — Progress Notes (Addendum)
PT Cancellation Note  Patient Details Name: Bailey [name] MRN: [id number] DOB: April 12, 1956   Cancelled Treatment:    Reason Eval/Treat Not Completed: Patient not medically ready. Pt remains intubated and sedated. Scheduled for MRI at noon and possible extubation this afternoon. PT to follow.   Lorriane Shire 05/16/2022, 9:07 AM  Lorrin Goodell, PT  Office # [phone] Pager [phone]

## 2022-05-16 NOTE — Progress Notes (Signed)
OT Cancellation Note  Patient Details Name: Bailey [name] MRN: [id number] DOB: November 08, 1955   Cancelled Treatment:    Reason Eval/Treat Not Completed: Active bedrest order Awaiting extubation.  [name] [name] 05/16/2022, 10:35 AM

## 2022-05-16 NOTE — Progress Notes (Signed)
  Echocardiogram 2D Echocardiogram has been performed.  [name] F 05/16/2022, 11:47 AM

## 2022-05-16 NOTE — Progress Notes (Signed)
Pt transferred to and from MRI on the ventilator without incident.

## 2022-05-16 NOTE — Procedures (Signed)
INR Status post bilateral common carotid artery and left vertebral arteriograms. Right CFA approach. Findings. 1.  Occluded left internal carotid artery at the bulb to the terminus with occluded anterior cerebral artery in the left middle cerebral artery. Treatment.   Status post endovascular revascularization of occluded left internal carotid artery artery and left middle cerebral artery M1 segment and trifurcation branches with 1 pass with 3 mm x 20 mm Solitaire X retrieval device and proximal aspiration, and 2 passes of Solitaire X 4 mm x 40 mm retrieval device and proximal aspiration achieving a TICI 2C  revascularization. Status post stent assisted angioplasty of preocclusive left internal carotid artery at the bulb secondary to thick calcified atherosclerotic plaque. Patient is loaded with aspirin 81 mg, and Brilinta 180 mg via orogastric tube.  Also given cangrelor IV bolus at time of stent placement, with a 4-hour infusion. Infusion to stop at 4:31 AM.  Obtain CT of the brain without infusion. 3 CT scans of the brain obtained during the procedure demonstrated a approximately half centimeter of hemorrhage in the left posterior basal ganglia region surrounded by contrast stain. Patient left intubated due to heart medical accommodation.  8 French Angio-Seal closure device applied for hemostasis of the right groin puncture site.  Distal pulses unchanged with dopplerable DP and posterior tibial on the left, and dopplerable posterior tibial on the right.  Arlean Hopping MD.

## 2022-05-16 NOTE — Consult Note (Signed)
NAME:  Bailey [name], MRN:  [id number], DOB:  February 28, 1956, LOS: 1 ADMISSION DATE:  05/15/2022, CONSULTATION DATE:  05/16/22 REFERRING MD:  He Deveshwar, CHIEF COMPLAINT:  post operative resp failure   History of Present Illness:  66 yo female with pmh HTN, hyperlipedmia, DM2, tobacco abuse whom presented from home by EMS with waxing and waning stroke symptoms. She was last known normal by family 2 days ago, but apparently also attempted to contact her PCP at 83 this am. Upon EMS arrival she was noted to be leaning to R with repetitive statements and unable to follow commands.   Her BP was remarkable for sbp >220 at presentation to ED. She cont to have similar symptoms that appeared to improve but then worsen. She was taken for [facility] and there was L MCA hyperdense area noted with her unknown last known normal she was no longer candidate for tpa but was taken for thrombectomy.   Pt had angioplasty with stent placement but there is unfortunately residual thrombus distally and a small bleed noted. She remains intubated and sedated as well as on cleviprex for BP control for which CCM has been consulted.   Pertinent  Medical History  HTN Hyperlipidemia Dm2 Tobacco abuse LMCA CVA  Significant Hospital Events: Including procedures, antibiotic start and stop dates in addition to other pertinent events   8/25: presented with L MCA CVA like symptoms s/p thrombectomy/stent   Interim History / Subjective:    Objective   Blood pressure (!) 195/101, pulse 99, temperature 99.2 F (37.3 C), temperature source Oral, resp. rate 18, SpO2 99 %.        Intake/Output Summary (Last 24 hours) at 05/16/2022 0214 Last data filed at 05/16/2022 0145 Gross per 24 hour  Intake 1050 ml  Output 800 ml  Net 250 ml   There were no vitals filed for this visit.  Examination: General: sedated and unresponsive on vent.  HENT: NCAT, pupils pinpoint bilaterally and sluggish but appear equal, mmmp with some oral  secretions noted Lungs: ctab Cardiovascular: rrr no m/g/r Abdomen: soft, nt,nd bs + Extremities: no C/C/E, withdraws to pain in all extremities, spontaneously moves L side.  Neuro: as above. Otherwise on 38mg propofol so complete neuro exam is challenging GU: deferred  Resolved Hospital Problem list     Assessment & Plan:  L MCA CVA:  -s/p thrombectomy -stent placement.  -remains on cangrelor -goal sbp 140's -cleviprex titration Basal ganglia bleed: -post cva and intervention.  -BP control -repeat cth at 0Erinemergency:  -titrate cleviprex -resume oral agents as able.   Dm2:  -ssi -hgb a1c pedning.   Acute post operative hypoxic respiratory failure:  -titrate vent -sat/sbt once re-eval'd and bp less labile   Best Practice (right click and "Reselect all SmartList Selections" daily)   Diet/type: NPO DVT prophylaxis: SCD GI prophylaxis: PPI Lines: N/A Foley:  Yes, and it is still needed Code Status:  full code Last date of multidisciplinary goals of care discussion [per primary]  Labs   CBC: Recent Labs  Lab 05/15/22 1656 05/15/22 1703 05/15/22 1824  WBC 6.9  --   --   NEUTROABS 5.5  --   --   HGB 6.1* 8.8* 12.5  HCT 20.3* 26.0* 41.1  MCV 74.4*  --   --   PLT 158  --   --     Basic Metabolic Panel: Recent Labs  Lab 05/15/22 1656 05/15/22 1703 05/15/22 1824  NA 144 146* 137  K <  2.0* 2.6* 4.3  CL 129* 116* 107  CO2 12*  --  20*  GLUCOSE 79 88 130*  BUN 5* 6* 10  CREATININE <0.30* <0.20* 0.62  CALCIUM 4.3*  --  9.8  MG  --   --  1.5*   GFR: CrCl cannot be calculated (Unknown ideal weight.). Recent Labs  Lab 05/15/22 1656  WBC 6.9    Liver Function Tests: Recent Labs  Lab 05/15/22 1656 05/15/22 1824  AST 8* 20  ALT 9 19  ALKPHOS 37* 86  BILITOT <0.1* 0.4  PROT <3.0* 7.2  ALBUMIN <1.5* 3.6   No results for input(s): "LIPASE", "AMYLASE" in the last 168 hours. No results for input(s): "AMMONIA" in the last 168  hours.  ABG    Component Value Date/Time   TCO2 15 (L) 05/15/2022 1703     Coagulation Profile: Recent Labs  Lab 05/15/22 1656  INR 1.0    Cardiac Enzymes: No results for input(s): "CKTOTAL", "CKMB", "CKMBINDEX", "TROPONINI" in the last 168 hours.  HbA1C: Hgb A1c MFr Bld  Date/Time Value Ref Range Status  01/05/2022 01:02 PM 7.2 (H) <5.7 % of total Hgb Final    Comment:    For someone without known diabetes, a hemoglobin A1c value of 6.5% or greater indicates that they may have  diabetes and this should be confirmed with a follow-up  test. . For someone with known diabetes, a value <7% indicates  that their diabetes is well controlled and a value  greater than or equal to 7% indicates suboptimal  control. A1c targets should be individualized based on  duration of diabetes, age, comorbid conditions, and  other considerations. . Currently, no consensus exists regarding use of hemoglobin A1c for diagnosis of diabetes for children. .   07/02/2021 04:30 PM 6.7 (H) <5.7 % of total Hgb Final    Comment:    For someone without known diabetes, a hemoglobin A1c value of 6.5% or greater indicates that they may have  diabetes and this should be confirmed with a follow-up  test. . For someone with known diabetes, a value <7% indicates  that their diabetes is well controlled and a value  greater than or equal to 7% indicates suboptimal  control. A1c targets should be individualized based on  duration of diabetes, age, comorbid conditions, and  other considerations. . Currently, no consensus exists regarding use of hemoglobin A1c for diagnosis of diabetes for children. .     CBG: Recent Labs  Lab 05/15/22 1651  GLUCAP 152*    Review of Systems:   Unobtainable 2/2 intubated  Past Medical History:  She,  has a past medical history of Arthritis, Cataract, COPD (chronic obstructive pulmonary disease) ([place]), Diabetes mellitus without complication ([place]), Fibrocystic  breast disease (July 2016), GERD (gastroesophageal reflux disease), Hyperlipidemia, and Hypertension.   Surgical History:   Past Surgical History:  Procedure Laterality Date   BREAST BIOPSY Right 04/19/2015   benign   BREAST BIOPSY Right 11/01/2020   fibroadenoma   CATARACT EXTRACTION     CHOLECYSTECTOMY     COLONOSCOPY     GALLBLADDER SURGERY     UTERINE FIBROID SURGERY       Social History:   reports that she has been smoking cigarettes. She has a 34.00 pack-year smoking history. She has been exposed to tobacco smoke. She has never used smokeless tobacco. She reports current alcohol use. She reports that she does not use drugs.   Family History:  Her family history includes Arthritis  in her sister; Cancer in her brother; Dementia in her brother and mother; Diabetes in her brother, brother, brother, brother, father, mother, sister, and sister; Heart disease in her brother and father; Narcolepsy in her sister; Obesity in her sister; Renal Disease in her brother; Stomach cancer in her sister; Stroke in her mother. There is no history of Colon cancer, Esophageal cancer, or Rectal cancer.   Allergies Not on File   Home Medications  Prior to Admission medications   Medication Sig Start Date End Date Taking? Authorizing Provider  acetaminophen (TYLENOL) 500 MG tablet Take 1,000 mg by mouth every 6 (six) hours as needed (pain). Reported on 04/08/2016    [provider]  albuterol (VENTOLIN HFA) 108 (90 Base) MCG/ACT inhaler Inhale 2 puffs into the lungs every 6 (six) hours as needed for wheezing or shortness of breath. 08/21/21   Copland, Gay Filler, MD  alendronate (FOSAMAX) 70 MG tablet Take 1 tablet (70 mg total) by mouth every 7 (seven) days. Take with a full glass of water on an empty stomach. 01/05/22   Copland, Gay Filler, MD  atorvastatin (LIPITOR) 40 MG tablet TAKE 1 TABLET EVERY DAY 12/12/21   Copland, Gay Filler, MD  blood glucose meter kit and supplies KIT Dispense based on  patient and insurance preference. Use up to four times daily as directed. (FOR ICD-9 250.00, 250.01). 09/26/15   Jaynee Eagles, PA-C  Blood Glucose Monitoring Suppl (ACCU-CHEK GUIDE ME) w/Device KIT Use to check blood sugar once a day. Dx code: E11.9 10/24/20   Copland, Gay Filler, MD  Boric Acid Vaginal (AZO BORIC ACID) 600 MG SUPP Place 1 suppository vaginally at bedtime. 01/02/22   Shelly Bombard, MD  butalbital-acetaminophen-caffeine (FIORICET) [phone] MG tablet Take 1-2 tablets by mouth every 6 (six) hours as needed for headache. Max 6 per day 04/16/22   Copland, Gay Filler, MD  canagliflozin (INVOKANA) 100 MG TABS tablet Take 1 tablet (100 mg total) by mouth daily before breakfast. 03/12/22   Copland, Gay Filler, MD  cholestyramine (QUESTRAN) 4 g packet DISSOLVE & TAKE 1 POWDER PACKET BY MOUTH TWICE DAILY 02/19/21   Copland, Gay Filler, MD  cyclobenzaprine (FLEXERIL) 5 MG tablet Take 1 tablet (5 mg total) by mouth at bedtime. Use as needed for neck pain 07/02/21   Copland, Gay Filler, MD  dicyclomine (BENTYL) 20 MG tablet Take 1 tablet (20 mg total) by mouth every 4 (four) hours. 06/04/21   Levin Erp, PA  glimepiride (AMARYL) 4 MG tablet TAKE 2 TABLETS EVERY DAY WITH BREAKFAST 12/12/21   Copland, Gay Filler, MD  glucose blood (ACCU-CHEK GUIDE) test strip USE ONE TIME DAILY TO TEST BLOOD SUGAR 03/28/21   Copland, Gay Filler, MD  lisinopril (ZESTRIL) 40 MG tablet Take 1 tablet (40 mg total) by mouth daily. 04/16/22   Copland, Gay Filler, MD  metFORMIN (GLUCOPHAGE) 1000 MG tablet Take 1 tablet (1,000 mg total) by mouth 2 (two) times daily with a meal. 02/20/22   Copland, Gay Filler, MD  pantoprazole (PROTONIX) 40 MG tablet Take 1 tablet (40 mg total) by mouth 2 (two) times daily. 02/20/22   Copland, Gay Filler, MD  triamcinolone cream (KENALOG) 0.1 % Apply 1 application topically 2 (two) times daily. Use as needed on dry or scaly areas 03/30/19   Copland, Gay Filler, MD     Critical care time: 43 min cc time

## 2022-05-16 NOTE — Progress Notes (Signed)
K+ 3.5  Replaced per protocol  

## 2022-05-16 NOTE — Progress Notes (Signed)
NAME:  Bailey [name], MRN:  [id number], DOB:  11-14-1955, LOS: 1 ADMISSION DATE:  05/15/2022, CONSULTATION DATE:  05/16/22 REFERRING MD:  He Deveshwar, CHIEF COMPLAINT:  post operative resp failure   History of Present Illness:  66 yo female with pmh HTN, hyperlipedmia, DM2, tobacco abuse whom presented from home by EMS with waxing and waning stroke symptoms. She was last known normal by family 2 days ago, but apparently also attempted to contact her PCP at 48 this am. Upon EMS arrival she was noted to be leaning to R with repetitive statements and unable to follow commands.   Her BP was remarkable for sbp >220 at presentation to ED. She cont to have similar symptoms that appeared to improve but then worsen. She was taken for [facility] and there was L MCA hyperdense area noted with her unknown last known normal she was no longer candidate for tpa but was taken for thrombectomy.   Pt had angioplasty with stent placement but there is unfortunately residual thrombus distally and a small bleed noted. She remains intubated and sedated as well as on cleviprex for BP control for which CCM has been consulted.   Pertinent  Medical History  HTN Hyperlipidemia Dm2 Tobacco abuse LMCA CVA  Significant Hospital Events: Including procedures, antibiotic start and stop dates in addition to other pertinent events   8/25: presented with L MCA CVA like symptoms s/p thrombectomy/stent   Interim History / Subjective:  This morning on propofol 10 and fentanyl 50 she is awakening eyes, following intermittent commands and purposeful.  Has not had SBT yet.  On high dose cleviprex.   Objective   Blood pressure (!) 87/51, pulse (!) 101, temperature 98.4 F (36.9 C), temperature source Axillary, resp. rate 19, height '5\' 5"'$  (1.651 m), weight 87 kg, SpO2 100 %.    Vent Mode: PRVC FiO2 (%):  [40 %] 40 % Set Rate:  [16 bmp] 16 bmp Vt Set:  [450 mL] 450 mL PEEP:  [5 cmH20] 5 cmH20 Plateau Pressure:  [15  cmH20-17 cmH20] 17 cmH20   Intake/Output Summary (Last 24 hours) at 05/16/2022 0853 Last data filed at 05/16/2022 0800 Gross per 24 hour  Intake 1769.59 ml  Output 1750 ml  Net 19.59 ml   Filed Weights   05/16/22 0243  Weight: 87 kg    Examination: Gen:      Intubated, sedated, acutely ill appearing HEENT:  ETT to vent Lungs:    sounds of mechanical ventilation auscultated no wheezes or crackles CV:         tachycardic, regular Abd:      + bowel sounds; soft, non-tender; no palpable masses, no distension Ext:    No edema Skin:      Warm and dry; no rashes Neuro:   RASS -1, follows commands, purposeful and withdraws to pain, active on left, flaccid on right   Resolved Hospital Problem list     Assessment & Plan:  Acute L MCA CVA Basal ganglia bleed -s/p thrombectomy and stent placement with some residual thrombus -remains on cangrelor -goal sbp 140's - adding back home lisinopril and titrating off cleviprex as needed  Htn emergency:  -titrate cleviprex - lisinopril as above. Can add additional agents as neeed  Dm2:  -ssi, holding home oral agents -hgb a1c pedning.   Acute post operative hypoxic respiratory failure:  -titrate vent - sedation holding discussed with nursing staff - will plan for SBT today coordinate with RT. Will evaluate for extubation.  Best Practice (right click and "Reselect all SmartList Selections" daily)   Diet/type: NPO DVT prophylaxis: SCD GI prophylaxis: PPI Lines: N/A Foley:  Yes, and it is still needed Code Status:  full code Last date of multidisciplinary goals of care discussion [per primary]  This time is my own personal additional cc from what was documented earlier on 8/26.   The patient is critically ill due to respiratory failure, stroke.  Critical care was necessary to treat or prevent imminent or life-threatening deterioration.  Critical care was time spent personally by me on the following activities: development of  treatment plan with patient and/or surrogate as well as nursing, discussions with consultants, evaluation of patient's response to treatment, examination of patient, obtaining history from patient or surrogate, ordering and performing treatments and interventions, ordering and review of laboratory studies, ordering and review of radiographic studies, pulse oximetry, re-evaluation of patient's condition and participation in multidisciplinary rounds.   Critical Care Time devoted to patient care services described in this note is 34 minutes. This time reflects time of care of this [place] . This critical care time does not reflect separately billable procedures or procedure time, teaching time or supervisory time of PA/NP/Med student/Med Resident etc but could involve care discussion time.       Spero Geralds [facility] 05/16/2022 8:53 AM  Pager: see AMION  If no response to pager , please call critical care on call (see AMION) until 7pm After 7:00 pm call Elink

## 2022-05-16 NOTE — Progress Notes (Signed)
Received the okay for Brilinta by Neuro IR prior to med administration.

## 2022-05-16 NOTE — Progress Notes (Signed)
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  Not following commands.  Minimal right toe movement/withdrawal.   Allergies: Patient has no allergy information on record.  Medications: Prior to Admission medications   Medication Sig Start Date End Date Taking? Authorizing Provider  acetaminophen (TYLENOL) 500 MG tablet Take 1,000 mg by mouth every 6 (six) hours as needed (pain). Reported on 04/08/2016    [provider]  albuterol (VENTOLIN HFA) 108 (90 Base) MCG/ACT inhaler Inhale 2 puffs into the lungs every 6 (six) hours as needed for wheezing or shortness of breath. 08/21/21   Copland, Gay Filler, MD  alendronate (FOSAMAX) 70 MG tablet Take 1 tablet (70 mg total) by mouth every 7 (seven) days. Take with a full glass of water on an empty stomach. 01/05/22   Copland, Gay Filler, MD  atorvastatin (LIPITOR) 40 MG tablet TAKE 1 TABLET EVERY DAY 12/12/21   Copland, Gay Filler, MD  blood glucose meter kit and supplies KIT Dispense based on patient and insurance preference. Use up to four times daily as directed. (FOR ICD-9 250.00, 250.01). 09/26/15   Jaynee Eagles, PA-C  Blood Glucose Monitoring Suppl (ACCU-CHEK GUIDE ME) w/Device KIT Use to check blood sugar once a day. Dx code: E11.9 10/24/20   Copland, Gay Filler, MD  Boric Acid Vaginal (AZO BORIC ACID) 600 MG SUPP Place 1 suppository vaginally at bedtime. 01/02/22   Shelly Bombard, MD  butalbital-acetaminophen-caffeine (FIORICET) [phone] MG tablet Take 1-2 tablets by mouth every 6 (six) hours as needed for headache. Max 6 per day 04/16/22   Copland, Gay Filler, MD  canagliflozin (INVOKANA) 100 MG TABS tablet Take 1 tablet (100 mg total) by mouth daily before breakfast. 03/12/22   Copland, Gay Filler, MD  cholestyramine (QUESTRAN) 4 g packet DISSOLVE & TAKE 1 POWDER PACKET  BY MOUTH TWICE DAILY 02/19/21   Copland, Gay Filler, MD  cyclobenzaprine (FLEXERIL) 5 MG tablet Take 1 tablet (5 mg total) by mouth at bedtime. Use as needed for neck pain 07/02/21   Copland, Gay Filler, MD  dicyclomine (BENTYL) 20 MG tablet Take 1 tablet (20 mg total) by mouth every 4 (four) hours. 06/04/21   Levin Erp, PA  glimepiride (AMARYL) 4 MG tablet TAKE 2 TABLETS EVERY DAY WITH BREAKFAST 12/12/21   Copland, Gay Filler, MD  glucose blood (ACCU-CHEK GUIDE) test strip USE ONE TIME DAILY TO TEST BLOOD SUGAR 03/28/21   Copland, Gay Filler, MD  lisinopril (ZESTRIL) 40 MG tablet Take 1 tablet (40 mg total) by mouth daily. 04/16/22   Copland, Gay Filler, MD  metFORMIN (GLUCOPHAGE) 1000 MG tablet Take 1 tablet (1,000 mg total) by mouth 2 (two) times daily with a meal. 02/20/22   Copland, Gay Filler, MD  pantoprazole (PROTONIX) 40 MG tablet Take 1 tablet (40 mg total) by mouth 2 (two) times daily. 02/20/22   Copland, Gay Filler, MD  triamcinolone cream (KENALOG) 0.1 % Apply 1 application topically 2 (two) times daily. Use as needed on dry or scaly areas 03/30/19   Copland, Gay Filler, MD     Vital Signs: BP (!) 139/51   Pulse 99   Temp 98.1 F (36.7 C) (Axillary)   Resp 13   Ht _0  (1.651 m)   Wt 191 lb 12.8 oz (87 kg)   SpO2 100%   BMI 31.92 kg/m  Physical Exam Intubated, sedated, alert Neuro: Tracking to left, no movement of eyes to right across midline.  Will not move head to the right. Spontaneous movement in left hand and leg.  Minimal withdrawal to pain with toes on right.   Imaging: CT HEAD WO CONTRAST (5MM)  Result Date: 05/16/2022 CLINICAL DATA:  Stroke follow-up EXAM: CT HEAD WITHOUT CONTRAST TECHNIQUE: Contiguous axial images were obtained from the base of the skull through the vertex without intravenous contrast. RADIATION DOSE REDUCTION: This exam was performed according to the departmental dose-optimization program which includes automated exposure control, adjustment of the mA  and/or kV according to patient size and/or use of iterative reconstruction technique. COMPARISON:  Flat plate CT from yesterday FINDINGS: Brain: Cytotoxic edema appearance in the low left parietal and posterior temporal cortex. High-density in the deep white matter on the left, more faintly seen at the left putamen, some combination of hemorrhage and contrast staining, non progressed. No hydrocephalus. Vascular: No focal hyperdensity. Skull: No acute finding Sinuses/Orbits: No acute finding IMPRESSION: 1. Contrast staining versus hemorrhage without mass effect, stable from intraprocedural CT yesterday. 2.  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 Electronically Signed   By: Jorje Guild M.D.   On: 05/16/2022 05:09   CT ANGIO HEAD NECK W WO CM W PERF (CODE STROKE)  Result Date: 05/15/2022 CLINICAL DATA:  Right-sided weakness.  Left MCA territory infarcts. EXAM: CT ANGIOGRAPHY HEAD AND NECK CT PERFUSION BRAIN TECHNIQUE: Multidetector CT imaging of the head and neck was performed using the standard protocol during bolus administration of intravenous contrast. Multiplanar CT image reconstructions and MIPs were obtained to evaluate the vascular anatomy. Carotid stenosis measurements (when applicable) are obtained utilizing NASCET criteria, using the distal internal carotid diameter as the denominator. Multiphase CT imaging of the brain was performed following IV bolus contrast injection. Subsequent parametric perfusion maps were calculated using RAPID software. RADIATION DOSE REDUCTION: This exam was performed according to the departmental dose-optimization program which includes automated exposure control, adjustment of the mA and/or kV according to patient size and/or use of iterative reconstruction technique. CONTRAST:  167mL OMNIPAQUE IOHEXOL 350 MG/ML SOLN COMPARISON:  CT HEAD without contrast 05/15/2022 9:34 P.M. FINDINGS: CTA NECK FINDINGS Aortic arch: Atherosclerotic changes are present the  aortic arch and great vessel origins. No significant stenosis or aneurysm is present. Narrowing of less than 50% is present at the origin of the left common carotid artery due to noncalcified plaque. Right carotid system: Right common carotid artery is within normal limits. Calcified and noncalcified plaque bifurcation results in narrowing of the proximal right ICA. The lumen is narrowed to 1.5 mm. This compares with a more normal distal segment of 4 mm just below the skull base. There is some irregularity in the mid right ICA. Left carotid system: The left common carotid artery is within normal limits beyond the proximal segment. The left internal carotid artery is occluded without significant reconstitution in the neck. Vertebral arteries: The left vertebral artery is the dominant vessel. The right vertebral artery is occluded proximally, reconstituted at the level of C6. Additional segmental narrowing is present in the right P2 segment. The more distal right vertebral artery is more normal. No significant stenosis is present in the dominant left vertebral artery. Skeleton: Mild degenerative changes are present in the lower cervical spine. No focal osseous lesions are present. Alignment is anatomic. Straightening of the normal cervical lordosis is present. Other neck: Soft tissues the neck are otherwise unremarkable. Salivary glands are within normal  limits. Thyroid is normal. No significant adenopathy is present. No focal mucosal or submucosal lesions are present. Upper chest: The lung apices are clear. Thoracic inlet is within normal limits. Review of the MIP images confirms the above findings CTA HEAD FINDINGS Anterior circulation: Atherosclerotic calcifications are present within the cavernous right ICA. Narrowing of less than 50% is noted relative to the more distal normal ICA terminus. The left cavernous ICA is reconstituted above the skull base. Dense calcifications are present in the anterior genu. There  is some flow in the left ICA terminus. The right M1 and A1 segments are normal. The anterior communicating artery is patent. The left A1 segment is narrowed. Mild narrowing is present in the proximal left M1 segment. Contrast opacification is present in the distal left M1 segment. The right MCA branch vessels and ACA branch vessels are within normal limits. Anterior MCA branch vessels are intact. The posterior left M2 segment is occluded minimal collateral vessels are present. Posterior circulation: The left vertebral artery is dominant vessel. PICA origins are visualized and normal. Vertebrobasilar junction and basilar artery normal. Both posterior cerebral arteries originate from the basilar tip. Right PCA vessels within normal limits. High-grade stenosis or occlusion is present in the left P2 segment. Distal branch vessels are partially reconstituted. Venous sinuses: The dural sinuses are patent. The straight sinus deep cerebral veins are intact. Cortical veins are within normal limits. No significant vascular malformation is evident. Anatomic variants: None Review of the MIP images confirms the above findings CT Brain Perfusion Findings: ASPECTS: 6/10 CBF (<30%) Volume: 42m Perfusion (Tmax>6.0s) volume: 1236mMismatch Volume: 6158mnfarction Location:Left posterior MCA territory These results were called by telephone at the time of interpretation on 05/15/2022 at 9:50 Pm to [facility] these [facility]. IMPRESSION: 1. 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 2. The posterior left M2 segment is occluded with poor collaterals. 3. 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 4. High-grade stenosis or occlusion of the left P2 segment. 5. The left A1 segment is narrowed. 6. Atherosclerotic changes within the cavernous right  ICA without significant stenosis. 7. Narrowing of less than 50% at the origin of the left common carotid artery. 8. 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 9. Aortic Atherosclerosis (ICD10-I70.0). These results were called by telephone at the time of interpretation on 05/15/2022 at 9:50pm to provider [facility] verb[facility] these results. Electronically Signed   By: ChrSan MorelleD.   On: 05/15/2022 22:17   CT HEAD CODE STROKE WO CONTRAST  Addendum Date: 05/15/2022   ADDENDUM REPORT: 05/15/2022 21:49 ADDENDUM: After discussion with Dr. BhaCurly Shorespects is 6/10 within additional point deducted at the ganglionic level. Electronically Signed   By: ChrSan MorelleD.   On: 05/15/2022 21:49   Result Date: 05/15/2022 CLINICAL DATA:  Code stroke. Left MCA syndrome. Last known well 2 days ago. EXAM: CT HEAD WITHOUT CONTRAST TECHNIQUE: Contiguous axial images were obtained from the base of the skull through the vertex without intravenous contrast. RADIATION DOSE REDUCTION: This exam was performed according to the departmental dose-optimization program which includes automated exposure control, adjustment of the mA and/or kV according to patient size and/or use of iterative reconstruction technique. COMPARISON:  CT head without contrast 05/15/2022 at 5:23 p.m. FINDINGS: Brain: Interval progression of loss  of gray-white differentiation involves the posterior left insular cortex the posterior left superior temporal gyrus and left parietal lobe. Sulci are effaced. No acute hemorrhage is present. Basal ganglia are intact. Scattered subcortical T2 hyperintensities are otherwise stable. The brainstem and cerebellum are within normal limits. Vascular: Atherosclerotic calcifications are present within the cavernous internal carotid arteries bilaterally. Hyperdense left M2 branches are noted. Skull: Calvarium is intact. No focal lytic or blastic lesions  are present. No significant extracranial soft tissue lesion is present. Sinuses/Orbits: Right lens replacement is present. Globes and orbits are otherwise within normal limits. The paranasal sinuses and mastoid air cells are clear. ASPECTS [facility] Stroke Program Early CT Score) - Ganglionic level infarction (caudate, lentiform nuclei, internal capsule, insula, M1-M3 cortex): 5/7 - Supraganglionic infarction (M4-M6 cortex): 2/3 Total score (0-10 with 10 being normal): 7/10 IMPRESSION: 1. Interval progression of loss of gray-white differentiation involving the posterior left insular cortex the posterior left superior temporal gyrus and left parietal lobe compatible with acute/subacute infarct. 2. Hyperdense left M2 branches compatible with acute thrombus. 3. ASPECTS is 7/10. 4. Stable atrophy and white matter disease. This likely reflects the sequela of chronic microvascular ischemia. The above was relayed via text pager to Dr. Lesleigh Noe on 05/15/2022 at 21:43 . Electronically Signed: By: San Morelle M.D. On: 05/15/2022 21:43   DG Chest Portable 1 View  Result Date: 05/15/2022 CLINICAL DATA:  Altered mental status EXAM: PORTABLE CHEST 1 VIEW COMPARISON:  Chest x-ray dated June 23, 2021 FINDINGS: The heart size and mediastinal contours are within normal limits. Both lungs are clear. The visualized skeletal structures are unremarkable. IMPRESSION: No active disease. Electronically Signed   By: Yetta Glassman M.D.   On: 05/15/2022 17:47   CT HEAD WO CONTRAST (5MM)  Result Date: 05/15/2022 CLINICAL DATA:  Neuro deficit. Suspected stroke. RIGHT-sided weakness. RIGHT-sided facial droop. EXAM: CT HEAD WITHOUT CONTRAST TECHNIQUE: Contiguous axial images were obtained from the base of the skull through the vertex without intravenous contrast. RADIATION DOSE REDUCTION: This exam was performed according to the departmental dose-optimization program which includes automated exposure control, adjustment  of the mA and/or kV according to patient size and/or use of iterative reconstruction technique. COMPARISON:  None Available. FINDINGS: Brain: There is central and cortical atrophy. Minimal focal areas of periventricular white matter changes are consistent with small vessel disease. There is no intra or extra-axial fluid collection or mass lesion. The basilar cisterns and ventricles have a normal appearance. There is no CT evidence for acute infarction or hemorrhage. Vascular: No hyperdense vessel or unexpected calcification. Skull: Normal. Negative for fracture or focal lesion. Sinuses/Orbits: No acute finding. Other: None. IMPRESSION: No evidence for acute intracranial abnormality. Electronically Signed   By: Nolon Nations M.D.   On: 05/15/2022 17:23    Labs:  CBC: Recent Labs    06/23/21 0723 03/30/22 1920 05/15/22 1656 05/15/22 1703 05/15/22 1824 05/16/22 0304 05/16/22 0405  WBC 9.5 10.4 6.9  --   --   --  12.1*  HGB 13.5 11.8* 6.1* 8.8* 12.5 11.2* 10.7*  HCT 43.8 38.4 20.3* 26.0* 41.1 33.0* 32.5*  PLT 318 308 158  --   --   --  272    COAGS: Recent Labs    05/15/22 1656  INR 1.0  APTT 23*    BMP: Recent Labs    03/30/22 1920 05/15/22 1656 05/15/22 1703 05/15/22 1824 05/16/22 0304 05/16/22 0405  NA 136 144 146* 137 138 136  K 3.8 <2.0* 2.6* 4.3 3.7  3.5  CL 105 129* 116* 107  --  107  CO2 22 12*  --  20*  --  20*  GLUCOSE 133* 79 88 130*  --  214*  BUN 12 5* 6* 10  --  7*  CALCIUM 9.4 4.3*  --  9.8  --  8.5*  CREATININE 0.58 <0.30* <0.20* 0.62  --  0.54  GFRNONAA >60 NOT CALCULATED  --  >60  --  >60    LIVER FUNCTION TESTS: Recent Labs    03/30/22 1920 05/15/22 1656 05/15/22 1824 05/16/22 0405  BILITOT 0.4 <0.1* 0.4 0.5  AST 15 8* 20 13*  ALT _0 ALKPHOS 89 37* 86 71  PROT 6.6 <3.0* 7.2 5.7*  ALBUMIN 3.4* <1.5* 3.6 2.9*    Assessment and Plan: 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 Also with stent-assisted angioplasty of preocclusive L ICA. Patient loaded with Brilinta 172m, aspirin 855mand was given cangrelor IV infusion overnight.  Brilinta 9076meld this AM due to concern for contrast vs. Hemorrhage on CT Head this AM.  Dr. DevEstanislado Pandys discussed with Dr. SetLeonie ManOk to give Brilinta due to high re-occlusion risk.   NIR to follow.   Electronically Signed: KacDocia BarrierA 05/16/2022, 12:43 PM   I spent a total of 15 Minutes at the the patient's bedside AND on the patient's hospital floor or unit, greater than 50% of which was counseling/coordinating care for L MCA

## 2022-05-16 NOTE — Progress Notes (Signed)
Patient self extubated at 11:38 despite being restrained on the left side. Patient does not move the right side. Patient slid down in bed and reached for the tube with her left arm and pulled out the tube. RN assessed patient, cough and gag present. Non rebreather placed on patient.

## 2022-05-16 NOTE — Progress Notes (Addendum)
STROKE TEAM PROGRESS NOTE   INTERVAL HISTORY No family at the bedside. S/p revascularization of occluded left internal carotid artery artery and left middle cerebral artery M1. Patient is loaded with aspirin 81 mg, and Brilinta 180 mg via orogastric tube.  Also given cangrelor IV bolus at time of stent placement, with a 4-hour infusion. MRI and MRA brain ordered. She remains intubated. Plan to titrate down sedation after MRI in hopes of extubation per CCM. Cleviprex on board for BP control. Home lisinopril resumed, PRN labetalol ordered.  No family at the bedside. Vitals:   05/16/22 0645 05/16/22 0646 05/16/22 0700 05/16/22 0707  BP:      Pulse: 77 77 78 77  Resp: '16 16 16 16  '$ Temp:      TempSrc:      SpO2: 99% 99% 99% 99%  Weight:      Height:       CBC:  Recent Labs  Lab 05/15/22 1656 05/15/22 1703 05/16/22 0304 05/16/22 0405  WBC 6.9  --   --  12.1*  NEUTROABS 5.5  --   --  8.8*  HGB 6.1*   < > 11.2* 10.7*  HCT 20.3*   < > 33.0* 32.5*  MCV 74.4*  --   --  68.7*  PLT 158  --   --  272   < > = values in this interval not displayed.   Basic Metabolic Panel:  Recent Labs  Lab 05/15/22 1824 05/16/22 0304 05/16/22 0405  NA 137 138 136  K 4.3 3.7 3.5  CL 107  --  107  CO2 20*  --  20*  GLUCOSE 130*  --  214*  BUN 10  --  7*  CREATININE 0.62  --  0.54  CALCIUM 9.8  --  8.5*  MG 1.5*  --  1.7  PHOS  --   --  3.6   Lipid Panel:  Recent Labs  Lab 05/16/22 0405  CHOL 131  TRIG 349*  HDL 36*  CHOLHDL 3.6  VLDL 70*  LDLCALC 25   HgbA1c: No results for input(s): "HGBA1C" in the last 168 hours. Urine Drug Screen:  Recent Labs  Lab 05/16/22 0405  LABOPIA NONE DETECTED  COCAINSCRNUR NONE DETECTED  LABBENZ NONE DETECTED  AMPHETMU NONE DETECTED  THCU NONE DETECTED  LABBARB NONE DETECTED    Alcohol Level  Recent Labs  Lab 05/15/22 1824  ETH <10    IMAGING past 24 hours CT HEAD WO CONTRAST (5MM)  Result Date: 05/16/2022 CLINICAL DATA:  Stroke follow-up  EXAM: CT HEAD WITHOUT CONTRAST TECHNIQUE: Contiguous axial images were obtained from the base of the skull through the vertex without intravenous contrast. RADIATION DOSE REDUCTION: This exam was performed according to the departmental dose-optimization program which includes automated exposure control, adjustment of the mA and/or kV according to patient size and/or use of iterative reconstruction technique. COMPARISON:  Flat plate CT from yesterday FINDINGS: Brain: Cytotoxic edema appearance in the low left parietal and posterior temporal cortex. High-density in the deep white matter on the left, more faintly seen at the left putamen, some combination of hemorrhage and contrast staining, non progressed. No hydrocephalus. Vascular: No focal hyperdensity. Skull: No acute finding Sinuses/Orbits: No acute finding IMPRESSION: 1. Contrast staining versus hemorrhage without mass effect, stable from intraprocedural CT yesterday. 2.  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 Electronically Signed   By: Jorje Guild M.D.   On: 05/16/2022 05:09   CT ANGIO HEAD NECK W WO CM  W PERF (CODE STROKE)  Result Date: 05/15/2022 CLINICAL DATA:  Right-sided weakness.  Left MCA territory infarcts. EXAM: CT ANGIOGRAPHY HEAD AND NECK CT PERFUSION BRAIN TECHNIQUE: Multidetector CT imaging of the head and neck was performed using the standard protocol during bolus administration of intravenous contrast. Multiplanar CT image reconstructions and MIPs were obtained to evaluate the vascular anatomy. Carotid stenosis measurements (when applicable) are obtained utilizing NASCET criteria, using the distal internal carotid diameter as the denominator. Multiphase CT imaging of the brain was performed following IV bolus contrast injection. Subsequent parametric perfusion maps were calculated using RAPID software. RADIATION DOSE REDUCTION: This exam was performed according to the departmental dose-optimization program which includes  automated exposure control, adjustment of the mA and/or kV according to patient size and/or use of iterative reconstruction technique. CONTRAST:  139m OMNIPAQUE IOHEXOL 350 MG/ML SOLN COMPARISON:  CT HEAD without contrast 05/15/2022 9:34 P.M. FINDINGS: CTA NECK FINDINGS Aortic arch: Atherosclerotic changes are present the aortic arch and great vessel origins. No significant stenosis or aneurysm is present. Narrowing of less than 50% is present at the origin of the left common carotid artery due to noncalcified plaque. Right carotid system: Right common carotid artery is within normal limits. Calcified and noncalcified plaque bifurcation results in narrowing of the proximal right ICA. The lumen is narrowed to 1.5 mm. This compares with a more normal distal segment of 4 mm just below the skull base. There is some irregularity in the mid right ICA. Left carotid system: The left common carotid artery is within normal limits beyond the proximal segment. The left internal carotid artery is occluded without significant reconstitution in the neck. Vertebral arteries: The left vertebral artery is the dominant vessel. The right vertebral artery is occluded proximally, reconstituted at the level of C6. Additional segmental narrowing is present in the right P2 segment. The more distal right vertebral artery is more normal. No significant stenosis is present in the dominant left vertebral artery. Skeleton: Mild degenerative changes are present in the lower cervical spine. No focal osseous lesions are present. Alignment is anatomic. Straightening of the normal cervical lordosis is present. Other neck: Soft tissues the neck are otherwise unremarkable. Salivary glands are within normal limits. Thyroid is normal. No significant adenopathy is present. No focal mucosal or submucosal lesions are present. Upper chest: The lung apices are clear. Thoracic inlet is within normal limits. Review of the MIP images confirms the above findings  CTA HEAD FINDINGS Anterior circulation: Atherosclerotic calcifications are present within the cavernous right ICA. Narrowing of less than 50% is noted relative to the more distal normal ICA terminus. The left cavernous ICA is reconstituted above the skull base. Dense calcifications are present in the anterior genu. There is some flow in the left ICA terminus. The right M1 and A1 segments are normal. The anterior communicating artery is patent. The left A1 segment is narrowed. Mild narrowing is present in the proximal left M1 segment. Contrast opacification is present in the distal left M1 segment. The right MCA branch vessels and ACA branch vessels are within normal limits. Anterior MCA branch vessels are intact. The posterior left M2 segment is occluded minimal collateral vessels are present. Posterior circulation: The left vertebral artery is dominant vessel. PICA origins are visualized and normal. Vertebrobasilar junction and basilar artery normal. Both posterior cerebral arteries originate from the basilar tip. Right PCA vessels within normal limits. High-grade stenosis or occlusion is present in the left P2 segment. Distal branch vessels are partially reconstituted. Venous sinuses:  The dural sinuses are patent. The straight sinus deep cerebral veins are intact. Cortical veins are within normal limits. No significant vascular malformation is evident. Anatomic variants: None Review of the MIP images confirms the above findings CT Brain Perfusion Findings: ASPECTS: 6/10 CBF (<30%) Volume: 22m Perfusion (Tmax>6.0s) volume: 121mMismatch Volume: 6119mnfarction Location:Left posterior MCA territory These results were called by telephone at the time of interpretation on 05/15/2022 at 9:50 Pm to provider SRICare Onewho verbally acknowledged these results. IMPRESSION: 1. 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 2. The posterior left M2 segment is occluded with poor collaterals. 3.  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 4. High-grade stenosis or occlusion of the left P2 segment. 5. The left A1 segment is narrowed. 6. Atherosclerotic changes within the cavernous right ICA without significant stenosis. 7. Narrowing of less than 50% at the origin of the left common carotid artery. 8. 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 9. Aortic Atherosclerosis (ICD10-I70.0). These results were called by telephone at the time of interpretation on 05/15/2022 at 9:50pm to provider [facility]who verbally acknowledged these results. Electronically Signed   By: ChrSan MorelleD.   On: 05/15/2022 22:17   CT HEAD CODE STROKE WO CONTRAST  Addendum Date: 05/15/2022   ADDENDUM REPORT: 05/15/2022 21:49 ADDENDUM: After discussion with Dr. BhaCurly Shorespects is 6/10 within additional point deducted at the ganglionic level. Electronically Signed   By: ChrSan MorelleD.   On: 05/15/2022 21:49   Result Date: 05/15/2022 CLINICAL DATA:  Code stroke. Left MCA syndrome. Last known well 2 days ago. EXAM: CT HEAD WITHOUT CONTRAST TECHNIQUE: Contiguous axial images were obtained from the base of the skull through the vertex without intravenous contrast. RADIATION DOSE REDUCTION: This exam was performed according to the departmental dose-optimization program which includes automated exposure control, adjustment of the mA and/or kV according to patient size and/or use of iterative reconstruction technique. COMPARISON:  CT head without contrast 05/15/2022 at 5:23 p.m. FINDINGS: Brain: Interval progression of loss of gray-white differentiation involves the posterior left insular cortex the posterior left superior temporal gyrus and left parietal lobe. Sulci are effaced. No acute hemorrhage is present. Basal ganglia are intact. Scattered  subcortical T2 hyperintensities are otherwise stable. The brainstem and cerebellum are within normal limits. Vascular: Atherosclerotic calcifications are present within the cavernous internal carotid arteries bilaterally. Hyperdense left M2 branches are noted. Skull: Calvarium is intact. No focal lytic or blastic lesions are present. No significant extracranial soft tissue lesion is present. Sinuses/Orbits: Right lens replacement is present. Globes and orbits are otherwise within normal limits. The paranasal sinuses and mastoid air cells are clear. ASPECTS ([facility] Early CT Score) - Ganglionic level infarction (caudate, lentiform nuclei, internal capsule, insula, M1-M3 cortex): 5/7 - Supraganglionic infarction (M4-M6 cortex): 2/3 Total score (0-10 with 10 being normal): 7/10 IMPRESSION: 1. Interval progression of loss of gray-white differentiation involving the posterior left insular cortex the posterior left superior temporal gyrus and left parietal lobe compatible with acute/subacute infarct. 2. Hyperdense left M2 branches compatible with acute thrombus. 3. ASPECTS is 7/10. 4. Stable atrophy and white matter disease. This likely reflects the sequela of chronic microvascular ischemia. The above was relayed via text pager to Dr. SRILesleigh Noe 05/15/2022 at 21:43 . Electronically Signed: By: ChrSan Morelle  M.D. On: 05/15/2022 21:43   DG Chest Portable 1 View  Result Date: 05/15/2022 CLINICAL DATA:  Altered mental status EXAM: PORTABLE CHEST 1 VIEW COMPARISON:  Chest x-ray dated June 23, 2021 FINDINGS: The heart size and mediastinal contours are within normal limits. Both lungs are clear. The visualized skeletal structures are unremarkable. IMPRESSION: No active disease. Electronically Signed   By: Yetta Glassman M.D.   On: 05/15/2022 17:47   CT HEAD WO CONTRAST (5MM)  Result Date: 05/15/2022 CLINICAL DATA:  Neuro deficit. Suspected stroke. RIGHT-sided weakness. RIGHT-sided facial  droop. EXAM: CT HEAD WITHOUT CONTRAST TECHNIQUE: Contiguous axial images were obtained from the base of the skull through the vertex without intravenous contrast. RADIATION DOSE REDUCTION: This exam was performed according to the departmental dose-optimization program which includes automated exposure control, adjustment of the mA and/or kV according to patient size and/or use of iterative reconstruction technique. COMPARISON:  None Available. FINDINGS: Brain: There is central and cortical atrophy. Minimal focal areas of periventricular white matter changes are consistent with small vessel disease. There is no intra or extra-axial fluid collection or mass lesion. The basilar cisterns and ventricles have a normal appearance. There is no CT evidence for acute infarction or hemorrhage. Vascular: No hyperdense vessel or unexpected calcification. Skull: Normal. Negative for fracture or focal lesion. Sinuses/Orbits: No acute finding. Other: None. IMPRESSION: No evidence for acute intracranial abnormality. Electronically Signed   By: Nolon Nations M.D.   On: 05/15/2022 17:23    PHYSICAL EXAM  Physical Exam  Constitutional: Appears well-developed and well-nourished middle-age African-American lady who is intubated Cardiovascular: Normal rate and regular rhythm.  Respiratory: Effort normal, non-labored breathing  Neuro: Mental Status: Intubated and sedated.  Eyes are closed.  She responds to sternal rub with partially opening her eyes.  She does not follow commands.  She is globally aphasic. Cranial Nerves: II: PERRL, 15m reactive III,IV, VI: EOMI without ptosis or diploplia. Left gaze preferences, does not cross midline. Does not consistently track VIII: no response to sound X: cough and gag intact XI: Head turned to the left Motor: Tone is normal. Bulk is normal. Spontaneous movement noted on the left.  No movement on the right but flaccid hemiplegia Sensory: Withdraws to pain on the left,  minimally on the right    ASSESSMENT/PLAN Ms. SAvyanna Spadais a 65y.o. female with history of HTN, hyperlipedmia, DM2, tobacco abuse presenting with waxing and waning stroke symptoms.  She last talked to family several days before admission.  Symptom discovery was at 2 PM on 8/26. Initial head CT was read with no acute intracranial process. Her initial examination for ED provider was notable for fluctuating right-sided weakness.  She was making repetitive statements ("I can't talk") and not following commands. She was taken to IR emergently for a mechanical thrombectomy and stent assisted angioplasty of the left ICA. Remained intubated post procedure. MRI and MRA show reocclusion of stent.  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 Small vessel disease. Atrophy. ASPECTS 10.    CTA head & neck - Left posterior MCA territory infarct with core infarct volume of 62 mL. The posterior left M2 segment is occluded with poor collaterals. High-grade stenosis or occlusion of the left internal carotid artery at the bifurcation with reconstitution in the  left cavernous segment, likely from the ophthalmic artery. High-grade stenosis or occlusion of the left P2 segment.  CT perfusion Core 28m, Perfusion 128m Mismatch 61 MRI- large left MCA territory infarct, some ACA territory involvement as well MRA  No flow in the left ICA. Mod stenosis of the left P2  2D Echo EF 65-70% LDL 25 HgbA1c 7.2 VTE prophylaxis - SCDs    Diet   Diet NPO time specified   No antithrombotic prior to admission, now on aspirin 81 mg daily and Brilinta (ticagrelor) 90 mg bid.  Therapy recommendations:  Pending Disposition:  pending  Occluded Left ICA anad MCA at M1 s/p mechanical thrombectomy with  TICI2c revascularization  Stent assisted angioplasty of preocclusive left internal carotid artery at the bulb secondary to thick calcified atherosclerotic plaque Re occluded on MRA Continue brilinta  Hypertension Home meds:  lisinopril Unstable BP 120-140 per NIR  Cleviprex infusing  Hyperlipidemia Home meds:  Atorvastatin '40mg'$  LDL 25, goal < 70 Continue statin at discharge  Diabetes type II Uncontrolled Home meds:  Metformin  HgbA1c 7.2, goal < 7.0 CBGs Recent Labs    05/15/22 1651 05/16/22 0339  GLUCAP 152* 186*    SSI  Other Stroke Risk Factors Advanced Age >/= 6574Cigarette smoker, advised to stop smoking Obesity, Body mass index is 31.92 kg/m., BMI >/= 30 associated with increased stroke risk, recommend weight loss, diet and exercise as appropriate   Other Active Problems Acute Respiratory Failure Intubated for IR CCM on board for vent management and possible extubation  Sedation - fentanyl, propofol Dysphagia OG in place for meds Consider TF tomorrow if unable to extubate   Hospital day # 1  Patient seen and examined by NP/APP with MD. MD to update note as needed.   DeJanine OresDNP, FNP-BC [facility] Pager: [phone] STROKE MD NOTE :  I have personally obtained history,examined this patient, reviewed notes, independently viewed imaging studies, participated in medical decision making and plan of care.ROS completed by me personally and pertinent positives fully documented  I have made any additions or clarifications directly to the above note. Agree with note above.  Patient presented with aphasia and right hemiparesis secondary to left ICA, MCA and ACA occlusion and underwent successful mechanical thrombectomy requiring rescue proximal ICA stent.  Postcontrast CT showed possible blush versus small subarachnoid hemorrhage.  Recommend close neurological follow-up as per post thrombectomy protocol and strict blood pressure control with  goal 120-140 for the first 24 hours.  Continue aspirin and Brilinta for left ICA stent.  Check MRI scan of the brain and then consider extubation if patient needs criteria.  No family available at the bedside.  Discussed with Dr. DeShearon Stallsritical care medicine.  Discussed with Dr. DeEstanislado Pandyhis patient is critically ill and at significant risk of neurological worsening, death and care requires constant monitoring of vital signs, hemodynamics,respiratory and cardiac monitoring, extensive review of multiple databases, frequent neurological assessment, discussion with family, other specialists and medical decision making of high complexity.I have made any additions or clarifications directly to the above note.This critical care time does not reflect procedure time, or teaching time or supervisory time of PA/NP/Med Resident etc but could involve care discussion time.  I spent 30 minutes of neurocritical care time  in the care of  this patient.      PrAntony ContrasMD Medical Director MoParksager: [phone]/26/2023 5:04 PM   To contact Stroke Continuity provider, please refer to [email or web address]  hours, contact General Neurology

## 2022-05-16 NOTE — Anesthesia Postprocedure Evaluation (Signed)
Anesthesia Post Note  Patient: Bailey [name]  Procedure(s) Performed: IR WITH ANESTHESIA     Patient location during evaluation: ICU Anesthesia Type: General Level of consciousness: sedated and patient remains intubated per anesthesia plan Pain management: pain level controlled Vital Signs Assessment: post-procedure vital signs reviewed and stable Respiratory status: patient remains intubated per anesthesia plan Cardiovascular status: stable Postop Assessment: no apparent nausea or vomiting Anesthetic complications: no   No notable events documented.  Last Vitals:  Vitals:   05/15/22 2200 05/15/22 2206  BP: (!) 195/101   Pulse: 90 99  Resp: 15 18  Temp:    SpO2:      Last Pain:  Vitals:   05/15/22 1948  TempSrc: Oral                 Audry Pili

## 2022-05-16 NOTE — Transfer of Care (Signed)
Immediate Anesthesia Transfer of Care Note  Patient: Bailey [name]  Procedure(s) Performed: IR WITH ANESTHESIA  Patient Location: ICU  Anesthesia Type:General  Level of Consciousness: sedated and Patient remains intubated per anesthesia plan  Airway & Oxygen Therapy: Patient remains intubated per anesthesia plan and Patient placed on Ventilator (see vital sign flow sheet for setting)  Post-op Assessment: Report given to RN and Post -op Vital signs reviewed and stable.  Moving left arm towards ETT.  Post vital signs: Reviewed and stable  Last Vitals:  Vitals Value Taken Time  BP 113/47 05/16/22 0211  Temp    Pulse 93 05/16/22 0217  Resp 23 05/16/22 0217  SpO2 100 % 05/16/22 0217  Vitals shown include unvalidated device data.  Last Pain:  Vitals:   05/15/22 1948  TempSrc: Oral         Complications: No notable events documented.

## 2022-05-17 ENCOUNTER — Inpatient Hospital Stay (HOSPITAL_COMMUNITY): Payer: Medicare HMO

## 2022-05-17 DIAGNOSIS — I63232 Cerebral infarction due to unspecified occlusion or stenosis of left carotid arteries: Secondary | ICD-10-CM | POA: Diagnosis not present

## 2022-05-17 LAB — GLUCOSE, CAPILLARY
Glucose-Capillary: 187 mg/dL — ABNORMAL HIGH (ref 70–99)
Glucose-Capillary: 171 mg/dL — ABNORMAL HIGH (ref 70–99)
Glucose-Capillary: 148 mg/dL — ABNORMAL HIGH (ref 70–99)
Glucose-Capillary: 139 mg/dL — ABNORMAL HIGH (ref 70–99)
Glucose-Capillary: 132 mg/dL — ABNORMAL HIGH (ref 70–99)
Glucose-Capillary: 169 mg/dL — ABNORMAL HIGH (ref 70–99)

## 2022-05-17 LAB — TRIGLYCERIDES: Triglycerides: 79 mg/dL (ref <150)

## 2022-05-17 MED ORDER — OSMOLITE 1.2 CAL PO LIQD: 1000.0000 mL | ORAL | Status: DC

## 2022-05-17 MED ORDER — PROSOURCE TF20 ENFIT COMPATIBL EN LIQD
60.0000 mL | Freq: Every day | ENTERAL | Status: DC
Start: 2022-05-18 — End: 2022-05-22
  Administered 2022-05-18 – 2022-05-22 (×5): 60 mL
  Filled 2022-05-17 (×5): qty 60

## 2022-05-17 MED ORDER — OSMOLITE 1.5 CAL PO LIQD
1000.0000 mL | ORAL | Status: DC
Start: 2022-05-17 — End: 2022-05-18
  Administered 2022-05-17: 1000 mL
  Filled 2022-05-17: qty 1000

## 2022-05-17 NOTE — Progress Notes (Signed)
NAME:  Bailey [name], MRN:  [id number], DOB:  [birth year], LOS: 2 ADMISSION DATE:  05/15/2022, CONSULTATION DATE:  05/16/22 REFERRING MD:  He Deveshwar, CHIEF COMPLAINT:  post operative resp failure   History of Present Illness:  66 yo female with pmh HTN, hyperlipedmia, DM2, tobacco abuse whom presented from home by EMS with waxing and waning stroke symptoms. She was last known normal by family 2 days ago, but apparently also attempted to contact her PCP at 45 this am. Upon EMS arrival she was noted to be leaning to R with repetitive statements and unable to follow commands.   Her BP was remarkable for sbp >220 at presentation to ED. She cont to have similar symptoms that appeared to improve but then worsen. She was taken for [facility] and there was L MCA hyperdense area noted with her unknown last known normal she was no longer candidate for tpa but was taken for thrombectomy.   Pt had angioplasty with stent placement but there is unfortunately residual thrombus distally and a small bleed noted. She remains intubated and sedated as well as on cleviprex for BP control for which CCM has been consulted.   Pertinent  Medical History  HTN Hyperlipidemia Dm2 Tobacco abuse LMCA CVA  Significant Hospital Events: Including procedures, antibiotic start and stop dates in addition to other pertinent events   8/25: presented with L MCA CVA like symptoms s/p thrombectomy/stent 8/26 MRI shows re-occlusion of stent placement 8/26 at night patient self extubated  Interim History / Subjective:  Off cleviprex. Patient self-extubated overnight. This morning awake, alert, calm. Working with PT.   Objective   Blood pressure (!) 137/44, pulse 80, temperature 98.5 F (36.9 C), temperature source Oral, resp. rate 12, height '5\' 5"'$  (1.651 m), weight 87 kg, SpO2 100 %.    Vent Mode: PRVC FiO2 (%):  [40 %] 40 % Set Rate:  [16 bmp] 16 bmp Vt Set:  [450 mL] 450 mL PEEP:  [5 cmH20] 5 cmH20 Pressure Support:   [5 cmH20] 5 cmH20   Intake/Output Summary (Last 24 hours) at 05/17/2022 1001 Last data filed at 05/17/2022 0900 Gross per 24 hour  Intake 2115.51 ml  Output 1285 ml  Net 830.51 ml   Filed Weights   05/16/22 0243  Weight: 87 kg    Examination: Gen:      Sitting up, resting in bed HEENT:  on nasal cannula Lungs:    non labored, lungs clear CV:         RRR no mrg Abd:      + bowel sounds; soft, non-tender; no palpable masses, no distension Ext:    No edema Skin:      Warm and dry; no rashes Neuro:   awake, alert, right visual field neglect, right sided hemiplegia, global aphasia. Able to track, hold my hand with her left and intermittently follow commands   Resolved Hospital Problem list   Respiratory failure Hypertensive emergency Assessment & Plan:  Acute L MCA CVA Basal ganglia bleed -s/p thrombectomy and stent placement with stent occlusion - anti-platelet agents. Ok to place NG tube  -goal sbp 140's - adding back home lisinopril and titrating off cleviprex as needed - needs Pt/OT/SLP evaluation.   Htn :  - lisinopril  Dm2:  -ssi, holding home oral agents -hgb a1c pedning.    Best Practice (right click and "Reselect all SmartList Selections" daily)   Diet/type: NPO, advance per SLP. Tube feeds if needed.  DVT prophylaxis: SCD GI prophylaxis: PPI  will be stopped today Lines: N/A Foley:  Yes, and it is still needed Code Status:  full code Last date of multidisciplinary goals of care discussion [I updated the patient's son over the phone 8/26 regarding plan of care. Currently pursuing all aggressive measures]  Hopefully she can leave ICU today. PCCM will follow as needed. Please call if we can be of further assistance.  Lenice Llamas, MD Pulmonary and [place] 05/17/2022 10:05 AM Pager: see AMION  If no response to pager, please call critical care on call (see AMION) until 7pm After 7:00 pm call Elink

## 2022-05-17 NOTE — Progress Notes (Signed)
Nutrition Brief Note   RD received consult for initiation and management of tube feeds  Adult tube feeding protocol initiated. Full assessment by RD will follow.   Daneesha Quinteros MS, RD, LDN Please refer to AMION for RD and/or RD on-call/weekend/after hours pager  

## 2022-05-17 NOTE — Evaluation (Signed)
Speech Language Pathology Evaluation Patient Details Name: Bailey [name] MRN: [id number] DOB: June 19, 1956 Today's Date: 05/17/2022 Time: 1962-2297 SLP Time Calculation (min) (ACUTE ONLY): 11 min  Problem List:  Patient Active Problem List   Diagnosis Date Noted   Middle cerebral artery embolism, 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   Acute cerebrovascular accident (CVA) due to occlusion of left carotid artery ([place]) 05/15/2022   Osteoporosis 10/16/2021   Type 2 diabetes mellitus with diabetic neuropathy, unspecified ([place]) 07/02/2021   Delta beta thalassemia ([place]) 11/22/2019   Diabetic retinopathy ([place]) 02/20/2017   Fatty liver 11/24/2016   GERD (gastroesophageal reflux disease) 07/26/2015   Hyperlipidemia 07/26/2015   Dyspepsia 07/26/2015   Essential hypertension 07/26/2015   Fibrocystic breast disease 03/22/2015   Type 2 diabetes mellitus treated without insulin ([place]) 02/16/2013   Past Medical History:  Past Medical History:  Diagnosis Date   Arthritis    Cataract    COPD (chronic obstructive pulmonary disease) ([place])    Diabetes mellitus without complication ([place])    Fibrocystic breast disease July 2016   GERD (gastroesophageal reflux disease)    Hyperlipidemia    Hypertension    Past Surgical History:  Past Surgical History:  Procedure Laterality Date   BREAST BIOPSY Right 04/19/2015   benign   BREAST BIOPSY Right 11/01/2020   fibroadenoma   CATARACT EXTRACTION     CHOLECYSTECTOMY     COLONOSCOPY     GALLBLADDER SURGERY     UTERINE FIBROID SURGERY     HPI:  Pt is a 66 y.o. female who presented to the ED via EMS for AMS and R side weakness.  CTA revealed large L MCA infarct. Pt underwent endovascular revascularization of occluded left internal carotid artery artery and left middle cerebral artery M1 8/26. Intubated 8/25 and self extubated 8/26. PMH:  HTN, hyperlipidemia, diabetes, tobacco abuse.   Assessment / Plan / Recommendation Clinical Impression  Pt was seen for initial  speech/language assessment immediately following swallowing assessment. She currently presents with global aphasia.  She was unable to follow simple commands nor imitate their execution with max assist.  She did smile occasionally and made repetitive opening/closing gestures of left hand in response to clinician cues targeting other areas of body- motions were perseverative with max cues required to cease activity. She was not stimulable for any speech output, no voicing despite provision of rhythmic modeling; no attempts to verbalize.  SLP will follow while in acute care to address fundamental communication processes. Agree that [facility] would be excellent option for her for rehab.    SLP Assessment  SLP Recommendation/Assessment: Patient needs continued Speech Lanaguage Pathology Services SLP Visit Diagnosis: Dysphagia, oropharyngeal phase (R13.12)    Recommendations for follow up therapy are one component of a multi-disciplinary discharge planning process, led by the attending physician.  Recommendations may be updated based on patient status, additional functional criteria and insurance authorization.    Follow Up Recommendations  Acute inpatient rehab (3hours/day)    Assistance Recommended at Discharge  Frequent or constant Supervision/Assistance  Functional Status Assessment Patient has had a recent decline in their functional status and demonstrates the ability to make significant improvements in function in a reasonable and predictable amount of time.  Frequency and Duration min 3x week  2 weeks      SLP Evaluation Cognition  Overall Cognitive Status: Impaired/Different from baseline       Comprehension  Auditory Comprehension Overall Auditory Comprehension: Impaired Yes/No Questions: Impaired Basic Biographical Questions: 0-25% accurate Commands: Impaired One Step Basic Commands:  0-24% accurate Visual Recognition/Discrimination Discrimination: Exceptions to [facility] Common Objects:  Unable to indentify Reading Comprehension Reading Status: Not tested    Expression Verbal Expression Overall Verbal Expression: Impaired Initiation: Impaired Automatic Speech:  (no speech) Repetition: Impaired Common Objects: Unable to indentify Pragmatics: Impairment Written Expression Dominant Hand: Right Written Expression: Not tested   Oral / Motor  Oral Motor/Sensory Function Overall Oral Motor/Sensory Function: Other (comment) (no obvious facial asymmetry) Motor Speech Overall Motor Speech: Impaired            [name] 05/17/2022, 10:52 AM Estill Bamberg L. Tivis Ringer, MA CCC/SLP Clinical Specialist - [place] Office number [phone]

## 2022-05-17 NOTE — Evaluation (Signed)
Physical Therapy Evaluation Patient Details Name: Bailey [name] MRN: [id number] DOB: [birth year] Today's Date: 05/17/2022  History of Present Illness  Pt is a 66 y.o. female who presented to the ED via EMS for AMS and R side weakness.  CTA revealed L MCA infarct. Pt underwent endovascular revascularization of occluded left internal carotid artery artery and left middle cerebral artery M1 8/26. Intubated 8/25 and self extubated 8/26. PMH:  HTN, hyperlipidemia, diabetes, tobacco abuse.   Clinical Impression  Pt admitted with above diagnosis. PTA pt lived at home alone. Pt unable to provide further history and no family present. Pt currently with functional limitations due to the deficits listed below (see PT Problem List). On eval, pt required total assist rolling, +2 total assist supine to sit, and mod to min guard assist to maintain sitting balance EOB. Pt following simple commands < 25% of trials, with increased time. No active movement noted RUE/LE. Pt presents with R inattention and L gaze preference. Pt will benefit from skilled PT to increase their independence and safety with mobility to allow discharge to the venue listed below.          Recommendations for follow up therapy are one component of a multi-disciplinary discharge planning process, led by the attending physician.  Recommendations may be updated based on patient status, additional functional criteria and insurance authorization.  Follow Up Recommendations Acute inpatient rehab (3hours/day)      Assistance Recommended at Discharge Frequent or constant Supervision/Assistance  Patient can return home with the following  Two people to help with walking and/or transfers;Two people to help with bathing/dressing/bathroom;Assistance with cooking/housework;Assist for transportation;Help with stairs or ramp for entrance;Direct supervision/assist for medications management    Equipment Recommendations Other (comment) (TBD)   Recommendations for Other Services       Functional Status Assessment Patient has had a recent decline in their functional status and demonstrates the ability to make significant improvements in function in a reasonable and predictable amount of time.     Precautions / Restrictions Precautions Precautions: Fall;Other (comment) Precaution Comments: R hemiparesis, R inattention      Mobility  Bed Mobility Overal bed mobility: Needs Assistance Bed Mobility: Rolling, Supine to Sit, Sit to Supine Rolling: Total assist   Supine to sit: +2 for physical assistance, Total assist Sit to supine: Total assist, +2 for physical assistance   General bed mobility comments: assist with BLE and trunk, use of bed pad to scoot to EOB    Transfers                   General transfer comment: deferred    Ambulation/Gait                  Stairs            Wheelchair Mobility    Modified Rankin (Stroke Patients Only) Modified Rankin (Stroke Patients Only) Pre-Morbid Rankin Score: No symptoms Modified Rankin: Severe disability     Balance Overall balance assessment: Needs assistance Sitting-balance support: Feet supported, Single extremity supported Sitting balance-Leahy Scale: Poor Sitting balance - Comments: initially mod assist to maintain balance EOB, progressed to short episode of sitting min guard (with R lean) Postural control: Right lateral lean                                   Pertinent Vitals/Pain Pain Assessment Pain Assessment: Faces Faces Pain Scale: No hurt  Home Living Family/patient expects to be discharged to:: Private residence Living Arrangements: Alone                 Additional Comments: Per chart, pt from home alone. Pt unable to provide further history. No family available.    Prior Function                       Hand Dominance        Extremity/Trunk Assessment   Upper Extremity  Assessment Upper Extremity Assessment: RUE deficits/detail RUE Deficits / Details: no active movement noted. Able to move through full PROM    Lower Extremity Assessment Lower Extremity Assessment: RLE deficits/detail RLE Deficits / Details: no active movement noted. Able to move through full PROM. No reaction to noxious stimuli.       Communication      Cognition Arousal/Alertness: Awake/alert Behavior During Therapy: Flat affect Overall Cognitive Status: Difficult to assess                                 General Comments: global aphasia. Following simple commands < 25% of trials with increased time        General Comments General comments (skin integrity, edema, etc.): VSS on 2L. L gaze preference. Able to cross midline visually with verbal cues, physical assist with turning head R, and extended time.    Exercises     Assessment/Plan    PT Assessment Patient needs continued PT services  PT Problem List Decreased strength;Decreased mobility;Decreased knowledge of precautions;Decreased activity tolerance;Decreased cognition;Decreased knowledge of use of DME;Decreased balance       PT Treatment Interventions DME instruction;Therapeutic activities;Cognitive remediation;Therapeutic exercise;Patient/family education;Balance training;Functional mobility training;Gait training;Neuromuscular re-education    PT Goals (Current goals can be found in the Care Plan section)  Acute Rehab PT Goals Patient Stated Goal: unable to state PT Goal Formulation: Patient unable to participate in goal setting Time For Goal Achievement: 05/31/22 Potential to Achieve Goals: Good    Frequency Min 4X/week     Co-evaluation PT/OT/SLP Co-Evaluation/Treatment: Yes Reason for Co-Treatment: For patient/therapist safety;Necessary to address cognition/behavior during functional activity PT goals addressed during session: Mobility/safety with mobility;Balance         AM-PAC PT "6  Clicks" Mobility  Outcome Measure Help needed turning from your back to your side while in a flat bed without using bedrails?: Total Help needed moving from lying on your back to sitting on the side of a flat bed without using bedrails?: Total Help needed moving to and from a bed to a chair (including a wheelchair)?: Total Help needed standing up from a chair using your arms (e.g., wheelchair or bedside chair)?: Total Help needed to walk in hospital room?: Total Help needed climbing 3-5 steps with a railing? : Total 6 Click Score: 6    End of Session Equipment Utilized During Treatment: Oxygen Activity Tolerance: Patient tolerated treatment well Patient left: in bed;with call bell/phone within reach Nurse Communication: Mobility status PT Visit Diagnosis: Other abnormalities of gait and mobility (R26.89);Hemiplegia and hemiparesis Hemiplegia - Right/Left: Right Hemiplegia - caused by: Cerebral infarction    Time: 1610-9604 PT Time Calculation (min) (ACUTE ONLY): 26 min   Charges:   PT Evaluation $PT Eval Moderate Complexity: 1 Mod          Lorrin Goodell, PT  Office # [phone] Pager [phone]   Lorriane Shire 05/17/2022, 9:32 AM

## 2022-05-17 NOTE — Progress Notes (Signed)
A line flat lining and not flushing. Discontinued patient's A line per CCM's order.

## 2022-05-17 NOTE — Progress Notes (Signed)
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  Not following commands. Aphasic.  Right-sided neglect. Withdrawals R toes to pain only.   Allergies: Patient has no known allergies.  Medications: Prior to Admission medications   Medication Sig Start Date End Date Taking? Authorizing Provider  acetaminophen (TYLENOL) 500 MG tablet Take 1,000 mg by mouth every 6 (six) hours as needed (pain). Reported on 04/08/2016    [provider]  albuterol (VENTOLIN HFA) 108 (90 Base) MCG/ACT inhaler Inhale 2 puffs into the lungs every 6 (six) hours as needed for wheezing or shortness of breath. 08/21/21   Copland, Gay Filler, MD  alendronate (FOSAMAX) 70 MG tablet Take 1 tablet (70 mg total) by mouth every 7 (seven) days. Take with a full glass of water on an empty stomach. 01/05/22   Copland, Gay Filler, MD  atorvastatin (LIPITOR) 40 MG tablet TAKE 1 TABLET EVERY DAY 12/12/21   Copland, Gay Filler, MD  blood glucose meter kit and supplies KIT Dispense based on patient and insurance preference. Use up to four times daily as directed. (FOR ICD-9 250.00, 250.01). 09/26/15   Jaynee Eagles, PA-C  Blood Glucose Monitoring Suppl (ACCU-CHEK GUIDE ME) w/Device KIT Use to check blood sugar once a day. Dx code: E11.9 10/24/20   Copland, Gay Filler, MD  Boric Acid Vaginal (AZO BORIC ACID) 600 MG SUPP Place 1 suppository vaginally at bedtime. 01/02/22   Shelly Bombard, MD  butalbital-acetaminophen-caffeine (FIORICET) [phone] MG tablet Take 1-2 tablets by mouth every 6 (six) hours as needed for headache. Max 6 per day 04/16/22   Copland, Gay Filler, MD  canagliflozin (INVOKANA) 100 MG TABS tablet Take 1 tablet (100 mg total) by mouth daily before breakfast. 03/12/22   Copland, Gay Filler, MD  cholestyramine (QUESTRAN) 4 g packet DISSOLVE & TAKE 1 POWDER PACKET BY MOUTH TWICE  DAILY 02/19/21   Copland, Gay Filler, MD  cyclobenzaprine (FLEXERIL) 5 MG tablet Take 1 tablet (5 mg total) by mouth at bedtime. Use as needed for neck pain 07/02/21   Copland, Gay Filler, MD  dicyclomine (BENTYL) 20 MG tablet Take 1 tablet (20 mg total) by mouth every 4 (four) hours. 06/04/21   Levin Erp, PA  glimepiride (AMARYL) 4 MG tablet TAKE 2 TABLETS EVERY DAY WITH BREAKFAST 12/12/21   Copland, Gay Filler, MD  glucose blood (ACCU-CHEK GUIDE) test strip USE ONE TIME DAILY TO TEST BLOOD SUGAR 03/28/21   Copland, Gay Filler, MD  lisinopril (ZESTRIL) 40 MG tablet Take 1 tablet (40 mg total) by mouth daily. 04/16/22   Copland, Gay Filler, MD  metFORMIN (GLUCOPHAGE) 1000 MG tablet Take 1 tablet (1,000 mg total) by mouth 2 (two) times daily with a meal. 02/20/22   Copland, Gay Filler, MD  pantoprazole (PROTONIX) 40 MG tablet Take 1 tablet (40 mg total) by mouth 2 (two) times daily. 02/20/22   Copland, Gay Filler, MD  triamcinolone cream (KENALOG) 0.1 % Apply 1 application topically 2 (two) times daily. Use as needed on dry or scaly areas 03/30/19   Copland, Gay Filler, MD     Vital Signs: BP (!) 135/56   Pulse 80   Temp 98.6 F (37 C) (Axillary)   Resp 17   Ht 5' 5"  (1.651 m)   Wt 191 lb 12.8 oz (87 kg)   SpO2 100%   BMI 31.92 kg/m   Physical Exam  Alert, but non-communicative Neuro: Aphasic, Right sided facial droop.Tracking to left, no movement of eyes to right across midline.  Will not move head to the right. Spontaneous movement in left hand and leg.  Minimal withdrawal to pain with toes on right.   Imaging: DG Abd Portable 1V  Result Date: 05/17/2022 CLINICAL DATA:  NG tube placement EXAM: PORTABLE ABDOMEN - 1 VIEW COMPARISON:  None Available. FINDINGS: The NG tube side port and distal tip are in the left upper quadrant, likely in the stomach. IMPRESSION: The NG tube is in good position with the side port and distal tip in the region of the stomach. Electronically Signed   By: Dorise Bullion III M.D.   On: 05/17/2022 09:33   MR BRAIN WO CONTRAST  Result Date: 05/16/2022 CLINICAL DATA:  Stroke, follow-up. Status post reperfusion of left cervical internal carotid artery and left M2 vessels. EXAM: MRI HEAD WITHOUT CONTRAST TECHNIQUE: Multiplanar, multiecho pulse sequences of the brain and surrounding structures were obtained without intravenous contrast. 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 Additional punctate foci of restricted diffusion are present along the watershed distribution. A 5 mm infarct is present medial left thalamus. Distal left ACA territory infarct extends over 4 cm. T2 and FLAIR hyperintensity associated with the areas acute. Additional moderate periventricular and subcortical white matter changes are present bilaterally, left greater than right. Basal ganglia are within normal. No other significant ischemic changes are present in thalami. The infarcted cortex is swollen with partial effacement of the sulci. The internal auditory canals are within normal limits. Insert normal brainstem Vascular: The left internal carotid artery is occluded below the ophthalmic segment. Signal in the posterior left MCA branch vessels is consistent slow or occluded. Normal flow voids are present in the right internal carotid artery proximal anterior cerebral arteries. Normal flow voids are present posterior circulation. Skull and upper cervical spine: The craniocervical junction is normal. Upper cervical spine is within normal limits. Marrow signal is unremarkable. Sinuses/Orbits: Mild mucosal thickening is present inferior maxillary sinuses bilaterally. Fluid is present in the nasopharynx. Bilateral mastoid effusions are present. Paranasal sinuses are otherwise clear. A right lens replacement is present. Globes and  orbits are otherwise within normal limits. IMPRESSION: 1. Acute nonhemorrhagic infarct involving the posterior left MCA territory and posterior left insula. 2. Distal left PCA territory infarct is outside the initial area of ischemic penumbra 3. Additional punctate foci of acute nonhemorrhagic infarct are present along the watershed distribution. 4. 5 mm acute nonhemorrhagic infarct in the medial left thalamus. 5. Moderate periventricular and subcortical white matter changes bilaterally are moderately advanced for age. This likely reflects the sequela of chronic microvascular ischemia. The above was relayed via text pager to Dr. Leonie Man on 05/16/2022 at 14:19 . Electronically Signed   By: San Morelle M.D.   On: 05/16/2022 16:27   MR ANGIO NECK WO CONTRAST  Result Date: 05/16/2022 CLINICAL DATA:  Status post reperfusion of left ICA occlusion. EXAM: MRA NECK WITHOUT CONTRAST TECHNIQUE: Angiographic images of the neck were acquired using MRA technique without intravenous contrast. Carotid stenosis measurements (when applicable) are obtained utilizing NASCET criteria, using the distal internal carotid diameter as the denominator. COMPARISON:  Cerebral and carotid angiography 05/15/2022 FINDINGS: Aortic arch: Moderate signal loss is present at the origin of the left subclavian artery. Mild narrowing is present in the proximal left common carotid  artery. Right carotid system: The right common carotid artery is within normal limits. Bifurcation is unremarkable. Mild narrowing is present in the proximal right ICA, less severe than suggested on the CTA. The more distal right internal carotid artery is within normal limits. Left carotid system: No flow signal is present in the left internal carotid artery in the neck. Vertebral arteries: The left vertebral artery is the dominant vessel. Flow is antegrade in both vertebral arteries in the neck. Other: None. IMPRESSION: 1. No flow signal in the left internal carotid  artery in the neck. The vessel appears to have reoccluded. 2. Mild narrowing of the proximal left common carotid artery. 3. Mild narrowing of the proximal right ICA is less severe than suggested on the CTA. 4. 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 The above was relayed via text pager to Dr. Leonie Man On 05/16/2022 at 14:19 . Electronically Signed   By: San Morelle M.D.   On: 05/16/2022 14:22   MR ANGIO HEAD WO CONTRAST  Result Date: 05/16/2022 CLINICAL DATA:  Left MCA territory infarct. Mechanical thrombectomy and reperfusion. EXAM: MRA HEAD WITHOUT CONTRAST TECHNIQUE: Angiographic images of the Circle of Willis were acquired using MRA technique without intravenous contrast. COMPARISON:  Cerebral arteriogram 05/15/2022 FINDINGS: Anterior circulation: No flow is present in the left internal carotid proximal to the ICA terminus. Flow is reconstituted via a left posterior communicating artery. Flow is present in the left M1 segment. The anterior left M2 segment and branch vessels are visualized. Right internal carotid artery demonstrates mild atherosclerotic changes within the cavernous segment without significant stenosis greater than 50%. The right A1 and M1 segments are normal. Anterior communicating artery is patent. The right ACA and MCA branch vessels are within normal limits. There is some attenuation distal left ACA branch vessels. Posterior circulation: The left vertebral artery is the dominant vessel. The vertebrobasilar junction basilar artery is normal. Prominent AICA vessels are present. Both posterior cerebral arteries originate from basilar tip. Moderate stenosis is present left P2 segment with asymmetric attenuation distal left PCA branches. Anatomic variants: None IMPRESSION: 1. No flow in the left internal carotid artery proximal to the ICA terminus. Flow is reconstituted via a left posterior communicating artery. 2. The left anterior communicating artery is patent. 3.  Moderate stenosis of the left P2 segment with asymmetric attenuation distal left PCA branch vessels. Electronically Signed   By: San Morelle M.D.   On: 05/16/2022 14:17   ECHOCARDIOGRAM COMPLETE  Result Date: 05/16/2022    ECHOCARDIOGRAM REPORT   Patient Name:   Bailey [name] Date of Exam: 05/16/2022 Medical Rec #:  [id number]           Height:       65.0 in Accession #:    [id number]          Weight:       191.8 lb Date of Birth:  Apr 21, 1956           BSA:          1.943 m Patient Age:    39 years            BP:           124/45 mmHg Patient Gender: F                   HR:           78 bpm. Exam Location:  Inpatient Procedure: 2D Echo, Cardiac Doppler and Color Doppler  Indications:    Stroke  History:        Patient has no prior history of Echocardiogram examinations.                 Respiratory failure requiring mechanical ventilation.  Sonographer:    [name] RDCS Referring Phys: Lorenza Chick  Sonographer Comments: Echo performed with patient supine and on artificial respirator. IMPRESSIONS  1. Left ventricular ejection fraction, by estimation, is 65 to 70%. The left ventricle has normal function. The left ventricle has no regional wall motion abnormalities. Left ventricular diastolic parameters are indeterminate.  2. Right ventricular systolic function is normal. The right ventricular size is normal.  3. Left atrial size was mildly dilated.  4. The mitral valve is abnormal. Trivial mitral valve regurgitation. No evidence of mitral stenosis.  5. The aortic valve is grossly normal. Aortic valve regurgitation is trivial. No aortic stenosis is present. Comparison(s): No prior Echocardiogram. Conclusion(s)/Recommendation(s): Otherwise normal echocardiogram, with minor abnormalities described in the report. No clear cardiac source of embolism. Consider TEE once clinically stable. FINDINGS  Left Ventricle: Left ventricular ejection fraction, by estimation, is 65 to 70%. The left ventricle has  normal function. The left ventricle has no regional wall motion abnormalities. The left ventricular internal cavity size was normal in size. There is  borderline left ventricular hypertrophy. Left ventricular diastolic parameters are indeterminate. Right Ventricle: The right ventricular size is normal. Right vetricular wall thickness was not well visualized. Right ventricular systolic function is normal. Left Atrium: Left atrial size was mildly dilated. Right Atrium: Right atrial size was normal in size. Pericardium: There is no evidence of pericardial effusion. Mitral Valve: The mitral valve is abnormal. There is mild thickening of the mitral valve leaflet(s). There is moderate calcification of the mitral valve leaflet(s). Trivial mitral valve regurgitation. No evidence of mitral valve stenosis. Tricuspid Valve: The tricuspid valve is grossly normal. Tricuspid valve regurgitation is trivial. No evidence of tricuspid stenosis. Aortic Valve: The aortic valve is grossly normal. Aortic valve regurgitation is trivial. No aortic stenosis is present. Aortic valve mean gradient measures 5.0 mmHg. Aortic valve peak gradient measures 10.4 mmHg. Aortic valve area, by VTI measures 2.50 cm. Pulmonic Valve: The pulmonic valve was not well visualized. Pulmonic valve regurgitation is not visualized. No evidence of pulmonic stenosis. Aorta: The aortic root, ascending aorta, aortic arch and descending aorta are all structurally normal, with no evidence of dilitation or obstruction. Venous: IVC assessment for right atrial pressure unable to be performed due to mechanical ventilation. IAS/Shunts: The atrial septum is grossly normal.  LEFT VENTRICLE PLAX 2D LVIDd:         4.00 cm   Diastology LVIDs:         2.40 cm   LV e' medial:    5.55 cm/s LV PW:         1.20 cm   LV E/e' medial:  13.9 LV IVS:        1.10 cm   LV e' lateral:   8.05 cm/s LVOT diam:     2.10 cm   LV E/e' lateral: 9.6 LV SV:         78 LV SV Index:   40 LVOT Area:      3.46 cm  RIGHT VENTRICLE             IVC RV Basal diam:  3.20 cm     IVC diam: 2.00 cm RV S prime:     14.10 cm/s TAPSE (  M-mode): 2.2 cm LEFT ATRIUM             Index        RIGHT ATRIUM           Index LA diam:        3.90 cm 2.01 cm/m   RA Area:     15.10 cm LA Vol (A2C):   57.0 ml 29.33 ml/m  RA Volume:   34.10 ml  17.55 ml/m LA Vol (A4C):   58.1 ml 29.90 ml/m LA Biplane Vol: 59.5 ml 30.62 ml/m  AORTIC VALVE AV Area (Vmax):    2.52 cm AV Area (Vmean):   2.48 cm AV Area (VTI):     2.50 cm AV Vmax:           161.00 cm/s AV Vmean:          106.000 cm/s AV VTI:            0.313 m AV Peak Grad:      10.4 mmHg AV Mean Grad:      5.0 mmHg LVOT Vmax:         117.00 cm/s LVOT Vmean:        75.900 cm/s LVOT VTI:          0.226 m LVOT/AV VTI ratio: 0.72  AORTA Ao Root diam: 3.10 cm MITRAL VALVE MV Area (PHT): 3.17 cm     SHUNTS MV Decel Time: 239 msec     Systemic VTI:  0.23 m MV E velocity: 77.40 cm/s   Systemic Diam: 2.10 cm MV A velocity: 108.00 cm/s MV E/A ratio:  0.72 Buford Dresser MD Electronically signed by Buford Dresser MD Signature Date/Time: 05/16/2022/1:16:18 PM    Final    CT HEAD WO CONTRAST (5MM)  Result Date: 05/16/2022 CLINICAL DATA:  Stroke follow-up EXAM: CT HEAD WITHOUT CONTRAST TECHNIQUE: Contiguous axial images were obtained from the base of the skull through the vertex without intravenous contrast. RADIATION DOSE REDUCTION: This exam was performed according to the departmental dose-optimization program which includes automated exposure control, adjustment of the mA and/or kV according to patient size and/or use of iterative reconstruction technique. COMPARISON:  Flat plate CT from yesterday FINDINGS: Brain: Cytotoxic edema appearance in the low left parietal and posterior temporal cortex. High-density in the deep white matter on the left, more faintly seen at the left putamen, some combination of hemorrhage and contrast staining, non progressed. No hydrocephalus.  Vascular: No focal hyperdensity. Skull: No acute finding Sinuses/Orbits: No acute finding IMPRESSION: 1. Contrast staining versus hemorrhage without mass effect, stable from intraprocedural CT yesterday. 2.  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 Electronically Signed   By: Jorje Guild M.D.   On: 05/16/2022 05:09   CT ANGIO HEAD NECK W WO CM W PERF (CODE STROKE)  Result Date: 05/15/2022 CLINICAL DATA:  Right-sided weakness.  Left MCA territory infarcts. EXAM: CT ANGIOGRAPHY HEAD AND NECK CT PERFUSION BRAIN TECHNIQUE: Multidetector CT imaging of the head and neck was performed using the standard protocol during bolus administration of intravenous contrast. Multiplanar CT image reconstructions and MIPs were obtained to evaluate the vascular anatomy. Carotid stenosis measurements (when applicable) are obtained utilizing NASCET criteria, using the distal internal carotid diameter as the denominator. Multiphase CT imaging of the brain was performed following IV bolus contrast injection. Subsequent parametric perfusion maps were calculated using RAPID software. RADIATION DOSE REDUCTION: This exam was performed according to the departmental dose-optimization program which includes automated exposure control, adjustment of the  mA and/or kV according to patient size and/or use of iterative reconstruction technique. CONTRAST:  127m OMNIPAQUE IOHEXOL 350 MG/ML SOLN COMPARISON:  CT HEAD without contrast 05/15/2022 9:34 P.M. FINDINGS: CTA NECK FINDINGS Aortic arch: Atherosclerotic changes are present the aortic arch and great vessel origins. No significant stenosis or aneurysm is present. Narrowing of less than 50% is present at the origin of the left common carotid artery due to noncalcified plaque. Right carotid system: Right common carotid artery is within normal limits. Calcified and noncalcified plaque bifurcation results in narrowing of the proximal right ICA. The lumen is narrowed to 1.5 mm. This  compares with a more normal distal segment of 4 mm just below the skull base. There is some irregularity in the mid right ICA. Left carotid system: The left common carotid artery is within normal limits beyond the proximal segment. The left internal carotid artery is occluded without significant reconstitution in the neck. Vertebral arteries: The left vertebral artery is the dominant vessel. The right vertebral artery is occluded proximally, reconstituted at the level of C6. Additional segmental narrowing is present in the right P2 segment. The more distal right vertebral artery is more normal. No significant stenosis is present in the dominant left vertebral artery. Skeleton: Mild degenerative changes are present in the lower cervical spine. No focal osseous lesions are present. Alignment is anatomic. Straightening of the normal cervical lordosis is present. Other neck: Soft tissues the neck are otherwise unremarkable. Salivary glands are within normal limits. Thyroid is normal. No significant adenopathy is present. No focal mucosal or submucosal lesions are present. Upper chest: The lung apices are clear. Thoracic inlet is within normal limits. Review of the MIP images confirms the above findings CTA HEAD FINDINGS Anterior circulation: Atherosclerotic calcifications are present within the cavernous right ICA. Narrowing of less than 50% is noted relative to the more distal normal ICA terminus. The left cavernous ICA is reconstituted above the skull base. Dense calcifications are present in the anterior genu. There is some flow in the left ICA terminus. The right M1 and A1 segments are normal. The anterior communicating artery is patent. The left A1 segment is narrowed. Mild narrowing is present in the proximal left M1 segment. Contrast opacification is present in the distal left M1 segment. The right MCA branch vessels and ACA branch vessels are within normal limits. Anterior MCA branch vessels are intact. The  posterior left M2 segment is occluded minimal collateral vessels are present. Posterior circulation: The left vertebral artery is dominant vessel. PICA origins are visualized and normal. Vertebrobasilar junction and basilar artery normal. Both posterior cerebral arteries originate from the basilar tip. Right PCA vessels within normal limits. High-grade stenosis or occlusion is present in the left P2 segment. Distal branch vessels are partially reconstituted. Venous sinuses: The dural sinuses are patent. The straight sinus deep cerebral veins are intact. Cortical veins are within normal limits. No significant vascular malformation is evident. Anatomic variants: None Review of the MIP images confirms the above findings CT Brain Perfusion Findings: ASPECTS: 6/10 CBF (<30%) Volume: 689mPerfusion (Tmax>6.0s) volume: 12379mismatch Volume: 48m51mfarction Location:Left posterior MCA territory These results were called by telephone at the time of interpretation on 05/15/2022 at 9:50 Pm to [facility] verbally acknowledged these results. IMPRESSION: 1. 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 2. The posterior left M2 segment is occluded with poor collaterals. 3. High-grade stenosis or occlusion of the left internal carotid artery at the  bifurcation with reconstitution in the left cavernous segment, likely from the ophthalmic artery. 4. High-grade stenosis or occlusion of the left P2 segment. 5. The left A1 segment is narrowed. 6. Atherosclerotic changes within the cavernous right ICA without significant stenosis. 7. Narrowing of less than 50% at the origin of the left common carotid artery. 8. 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 9. Aortic Atherosclerosis (ICD10-I70.0). These results were called by telephone at the time of interpretation on 05/15/2022 at 9:50pm to provider [facility] , who verbally acknowledged these results.  Electronically Signed   By: San Morelle M.D.   On: 05/15/2022 22:17   CT HEAD CODE STROKE WO CONTRAST  Addendum Date: 05/15/2022   ADDENDUM REPORT: 05/15/2022 21:49 ADDENDUM: After discussion with Dr. Curly [place] aspects is 6/10 within additional point deducted at the ganglionic level. Electronically Signed   By: San Morelle M.D.   On: 05/15/2022 21:49   Result Date: 05/15/2022 CLINICAL DATA:  Code stroke. Left MCA syndrome. Last known well 2 days ago. EXAM: CT HEAD WITHOUT CONTRAST TECHNIQUE: Contiguous axial images were obtained from the base of the skull through the vertex without intravenous contrast. RADIATION DOSE REDUCTION: This exam was performed according to the departmental dose-optimization program which includes automated exposure control, adjustment of the mA and/or kV according to patient size and/or use of iterative reconstruction technique. COMPARISON:  CT head without contrast 05/15/2022 at 5:23 p.m. FINDINGS: Brain: Interval progression of loss of gray-white differentiation involves the posterior left insular cortex the posterior left superior temporal gyrus and left parietal lobe. Sulci are effaced. No acute hemorrhage is present. Basal ganglia are intact. Scattered subcortical T2 hyperintensities are otherwise stable. The brainstem and cerebellum are within normal limits. Vascular: Atherosclerotic calcifications are present within the cavernous internal carotid arteries bilaterally. Hyperdense left M2 branches are noted. Skull: Calvarium is intact. No focal lytic or blastic lesions are present. No significant extracranial soft tissue lesion is present. Sinuses/Orbits: Right lens replacement is present. Globes and orbits are otherwise within normal limits. The paranasal sinuses and mastoid air cells are clear. ASPECTS [facility] Stroke Program Early CT Score) - Ganglionic level infarction (caudate, lentiform nuclei, internal capsule, insula, M1-M3 cortex): 5/7 - Supraganglionic  infarction (M4-M6 cortex): 2/3 Total score (0-10 with 10 being normal): 7/10 IMPRESSION: 1. Interval progression of loss of gray-white differentiation involving the posterior left insular cortex the posterior left superior temporal gyrus and left parietal lobe compatible with acute/subacute infarct. 2. Hyperdense left M2 branches compatible with acute thrombus. 3. ASPECTS is 7/10. 4. Stable atrophy and white matter disease. This likely reflects the sequela of chronic microvascular ischemia. The above was relayed via text pager to Dr. Lesleigh Noe on 05/15/2022 at 21:43 . Electronically Signed: By: San Morelle M.D. On: 05/15/2022 21:43   DG Chest Portable 1 View  Result Date: 05/15/2022 CLINICAL DATA:  Altered mental status EXAM: PORTABLE CHEST 1 VIEW COMPARISON:  Chest x-ray dated June 23, 2021 FINDINGS: The heart size and mediastinal contours are within normal limits. Both lungs are clear. The visualized skeletal structures are unremarkable. IMPRESSION: No active disease. Electronically Signed   By: Yetta Glassman M.D.   On: 05/15/2022 17:47   CT HEAD WO CONTRAST (5MM)  Result Date: 05/15/2022 CLINICAL DATA:  Neuro deficit. Suspected stroke. RIGHT-sided weakness. RIGHT-sided facial droop. EXAM: CT HEAD WITHOUT CONTRAST TECHNIQUE: Contiguous axial images were obtained from the base of the skull through the vertex without intravenous contrast. RADIATION DOSE REDUCTION: This  exam was performed according to the departmental dose-optimization program which includes automated exposure control, adjustment of the mA and/or kV according to patient size and/or use of iterative reconstruction technique. COMPARISON:  None Available. FINDINGS: Brain: There is central and cortical atrophy. Minimal focal areas of periventricular white matter changes are consistent with small vessel disease. There is no intra or extra-axial fluid collection or mass lesion. The basilar cisterns and ventricles have a normal  appearance. There is no CT evidence for acute infarction or hemorrhage. Vascular: No hyperdense vessel or unexpected calcification. Skull: Normal. Negative for fracture or focal lesion. Sinuses/Orbits: No acute finding. Other: None. IMPRESSION: No evidence for acute intracranial abnormality. Electronically Signed   By: Nolon Nations M.D.   On: 05/15/2022 17:23    Labs:  CBC: Recent Labs    06/23/21 0723 03/30/22 1920 05/15/22 1656 05/15/22 1703 05/15/22 1824 05/16/22 0304 05/16/22 0405  WBC 9.5 10.4 6.9  --   --   --  12.1*  HGB 13.5 11.8* 6.1* 8.8* 12.5 11.2* 10.7*  HCT 43.8 38.4 20.3* 26.0* 41.1 33.0* 32.5*  PLT 318 308 158  --   --   --  272     COAGS: Recent Labs    05/15/22 1656  INR 1.0  APTT 23*     BMP: Recent Labs    03/30/22 1920 05/15/22 1656 05/15/22 1703 05/15/22 1824 05/16/22 0304 05/16/22 0405  NA 136 144 146* 137 138 136  K 3.8 <2.0* 2.6* 4.3 3.7 3.5  CL 105 129* 116* 107  --  107  CO2 22 12*  --  20*  --  20*  GLUCOSE 133* 79 88 130*  --  214*  BUN 12 5* 6* 10  --  7*  CALCIUM 9.4 4.3*  --  9.8  --  8.5*  CREATININE 0.58 <0.30* <0.20* 0.62  --  0.54  GFRNONAA >60 NOT CALCULATED  --  >60  --  >60     LIVER FUNCTION TESTS: Recent Labs    03/30/22 1920 05/15/22 1656 05/15/22 1824 05/16/22 0405  BILITOT 0.4 <0.1* 0.4 0.5  AST 15 8* 20 13*  ALT 19 9 19 16   ALKPHOS 89 37* 86 71  PROT 6.6 <3.0* 7.2 5.7*  ALBUMIN 3.4* <1.5* 3.6 2.9*     Assessment and Plan: 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 Also with stent-assisted angioplasty of preocclusive L ICA. Patient extubated.   Neuro exam largely unchanged.  Being worked up for [facility] admission.  On Brilinta 9m BID.  Did get an MRA Head/Neck.  Will review with Dr. DEstanislado Pandy   NIR to follow.   Electronically Signed: KDocia Barrier  PA 05/17/2022, 2:20 PM   I spent a total of 15 Minutes at the the patient's bedside AND on the patient's hospital floor or unit, greater than 50% of which was counseling/coordinating care for L MCA occlusion

## 2022-05-17 NOTE — Progress Notes (Addendum)
STROKE TEAM PROGRESS NOTE   INTERVAL HISTORY No family at the bedside. Self extubated overnight.  Dense right hemiplegia, left gaze deviation. Hemodynamically stable.  MRI shows an acute nonhemorrhagic infarct involving the posterior left MCA territory and posterior left insula. Additional punctate foci of acute nonhemorrhagic infarct are present along the watershed distribution. 67m acute nonhemorrhagic infarct in the medial left thalamus.  MR angiogram of the neck showed occluded left carotid stent and neck.  MR angiogram of the brain shows some cross-filling of the left MCA from collaterals from P-comm.  No antegrade flow in the terminal left carotid.  2D echo showed ejection fraction of 60 to 65%. Vitals:   05/17/22 0500 05/17/22 0600 05/17/22 0700 05/17/22 0751  BP: (!) 146/67 (!) 151/65 (!) 138/55   Pulse: 79 81 93   Resp: 14 (!) 25 16   Temp:    98.5 F (36.9 C)  TempSrc:    Oral  SpO2: 100% 100% 100%   Weight:      Height:       CBC:  Recent Labs  Lab 05/15/22 1656 05/15/22 1703 05/16/22 0304 05/16/22 0405  WBC 6.9  --   --  12.1*  NEUTROABS 5.5  --   --  8.8*  HGB 6.1*   < > 11.2* 10.7*  HCT 20.3*   < > 33.0* 32.5*  MCV 74.4*  --   --  68.7*  PLT 158  --   --  272   < > = values in this interval not displayed.    Basic Metabolic Panel:  Recent Labs  Lab 05/15/22 1824 05/16/22 0304 05/16/22 0405  NA 137 138 136  K 4.3 3.7 3.5  CL 107  --  107  CO2 20*  --  20*  GLUCOSE 130*  --  214*  BUN 10  --  7*  CREATININE 0.62  --  0.54  CALCIUM 9.8  --  8.5*  MG 1.5*  --  1.7  PHOS  --   --  3.6    Lipid Panel:  Recent Labs  Lab 05/16/22 0405 05/17/22 0725  CHOL 131  --   TRIG 349* 79  HDL 36*  --   CHOLHDL 3.6  --   VLDL 70*  --   LDLCALC 25  --     HgbA1c: No results for input(s): "HGBA1C" in the last 168 hours. Urine Drug Screen:  Recent Labs  Lab 05/16/22 0405  LABOPIA NONE DETECTED  COCAINSCRNUR NONE DETECTED  LABBENZ NONE DETECTED   AMPHETMU NONE DETECTED  THCU NONE DETECTED  LABBARB NONE DETECTED     Alcohol Level  Recent Labs  Lab 05/15/22 1824  ETH <10     IMAGING past 24 hours MR BRAIN WO CONTRAST  Result Date: 05/16/2022 CLINICAL DATA:  Stroke, follow-up. Status post reperfusion of left cervical internal carotid artery and left M2 vessels. EXAM: MRI HEAD WITHOUT CONTRAST TECHNIQUE: Multiplanar, multiecho pulse sequences of the brain and surrounding structures were obtained without intravenous contrast. 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 Additional punctate foci of restricted diffusion are present along the watershed distribution. A 5 mm infarct is present medial left thalamus. Distal left ACA territory infarct extends over 4 cm. T2 and FLAIR hyperintensity associated with the areas acute. Additional moderate periventricular and subcortical white matter changes are present bilaterally, left greater than right. Basal  ganglia are within normal. No other significant ischemic changes are present in thalami. The infarcted cortex is swollen with partial effacement of the sulci. The internal auditory canals are within normal limits. Insert normal brainstem Vascular: The left internal carotid artery is occluded below the ophthalmic segment. Signal in the posterior left MCA branch vessels is consistent slow or occluded. Normal flow voids are present in the right internal carotid artery proximal anterior cerebral arteries. Normal flow voids are present posterior circulation. Skull and upper cervical spine: The craniocervical junction is normal. Upper cervical spine is within normal limits. Marrow signal is unremarkable. Sinuses/Orbits: Mild mucosal thickening is present inferior maxillary sinuses bilaterally. Fluid is present in the nasopharynx.  Bilateral mastoid effusions are present. Paranasal sinuses are otherwise clear. A right lens replacement is present. Globes and orbits are otherwise within normal limits. IMPRESSION: 1. Acute nonhemorrhagic infarct involving the posterior left MCA territory and posterior left insula. 2. Distal left PCA territory infarct is outside the initial area of ischemic penumbra 3. Additional punctate foci of acute nonhemorrhagic infarct are present along the watershed distribution. 4. 5 mm acute nonhemorrhagic infarct in the medial left thalamus. 5. Moderate periventricular and subcortical white matter changes bilaterally are moderately advanced for age. This likely reflects the sequela of chronic microvascular ischemia. The above was relayed via text pager to Dr. Leonie Man on 05/16/2022 at 14:19 . Electronically Signed   By: San Morelle M.D.   On: 05/16/2022 16:27   MR ANGIO NECK WO CONTRAST  Result Date: 05/16/2022 CLINICAL DATA:  Status post reperfusion of left ICA occlusion. EXAM: MRA NECK WITHOUT CONTRAST TECHNIQUE: Angiographic images of the neck were acquired using MRA technique without intravenous contrast. Carotid stenosis measurements (when applicable) are obtained utilizing NASCET criteria, using the distal internal carotid diameter as the denominator. COMPARISON:  Cerebral and carotid angiography 05/15/2022 FINDINGS: Aortic arch: Moderate signal loss is present at the origin of the left subclavian artery. Mild narrowing is present in the proximal left common carotid artery. Right carotid system: The right common carotid artery is within normal limits. Bifurcation is unremarkable. Mild narrowing is present in the proximal right ICA, less severe than suggested on the CTA. The more distal right internal carotid artery is within normal limits. Left carotid system: No flow signal is present in the left internal carotid artery in the neck. Vertebral arteries: The left vertebral artery is the dominant vessel.  Flow is antegrade in both vertebral arteries in the neck. Other: None. IMPRESSION: 1. No flow signal in the left internal carotid artery in the neck. The vessel appears to have reoccluded. 2. Mild narrowing of the proximal left common carotid artery. 3. Mild narrowing of the proximal right ICA is less severe than suggested on the CTA. 4. 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 The above was relayed via text pager to Dr. Leonie Man On 05/16/2022 at 14:19 . Electronically Signed   By: San Morelle M.D.   On: 05/16/2022 14:22   MR ANGIO HEAD WO CONTRAST  Result Date: 05/16/2022 CLINICAL DATA:  Left MCA territory infarct. Mechanical thrombectomy and reperfusion. EXAM: MRA HEAD WITHOUT CONTRAST TECHNIQUE: Angiographic images of the Circle of Willis were acquired using MRA technique without intravenous contrast. COMPARISON:  Cerebral arteriogram 05/15/2022 FINDINGS: Anterior circulation: No flow is present in the left internal carotid proximal to the ICA terminus. Flow is reconstituted via a left posterior communicating artery. Flow is present in the left M1 segment. The anterior left M2 segment and branch  vessels are visualized. Right internal carotid artery demonstrates mild atherosclerotic changes within the cavernous segment without significant stenosis greater than 50%. The right A1 and M1 segments are normal. Anterior communicating artery is patent. The right ACA and MCA branch vessels are within normal limits. There is some attenuation distal left ACA branch vessels. Posterior circulation: The left vertebral artery is the dominant vessel. The vertebrobasilar junction basilar artery is normal. Prominent AICA vessels are present. Both posterior cerebral arteries originate from basilar tip. Moderate stenosis is present left P2 segment with asymmetric attenuation distal left PCA branches. Anatomic variants: None IMPRESSION: 1. No flow in the left internal carotid artery proximal to the ICA  terminus. Flow is reconstituted via a left posterior communicating artery. 2. The left anterior communicating artery is patent. 3. Moderate stenosis of the left P2 segment with asymmetric attenuation distal left PCA branch vessels. Electronically Signed   By: San Morelle M.D.   On: 05/16/2022 14:17   ECHOCARDIOGRAM COMPLETE  Result Date: 05/16/2022    ECHOCARDIOGRAM REPORT   Patient Name:   Bailey [name] Date of Exam: 05/16/2022 Medical Rec #:  [id number]           Height:       65.0 in Accession #:    [id number]          Weight:       191.8 lb Date of Birth:  1956/01/09           BSA:          1.943 m Patient Age:    8 years            BP:           124/45 mmHg Patient Gender: F                   HR:           78 bpm. Exam Location:  Inpatient Procedure: 2D Echo, Cardiac Doppler and Color Doppler Indications:    Stroke  History:        Patient has no prior history of Echocardiogram examinations.                 Respiratory failure requiring mechanical ventilation.  Sonographer:    [name] RDCS Referring Phys: Lorenza Chick  Sonographer Comments: Echo performed with patient supine and on artificial respirator. IMPRESSIONS  1. Left ventricular ejection fraction, by estimation, is 65 to 70%. The left ventricle has normal function. The left ventricle has no regional wall motion abnormalities. Left ventricular diastolic parameters are indeterminate.  2. Right ventricular systolic function is normal. The right ventricular size is normal.  3. Left atrial size was mildly dilated.  4. The mitral valve is abnormal. Trivial mitral valve regurgitation. No evidence of mitral stenosis.  5. The aortic valve is grossly normal. Aortic valve regurgitation is trivial. No aortic stenosis is present. Comparison(s): No prior Echocardiogram. Conclusion(s)/Recommendation(s): Otherwise normal echocardiogram, with minor abnormalities described in the report. No clear cardiac source of embolism. Consider TEE once  clinically stable. FINDINGS  Left Ventricle: Left ventricular ejection fraction, by estimation, is 65 to 70%. The left ventricle has normal function. The left ventricle has no regional wall motion abnormalities. The left ventricular internal cavity size was normal in size. There is  borderline left ventricular hypertrophy. Left ventricular diastolic parameters are indeterminate. Right Ventricle: The right ventricular size is normal. Right vetricular wall thickness was not well visualized. Right ventricular systolic function is  normal. Left Atrium: Left atrial size was mildly dilated. Right Atrium: Right atrial size was normal in size. Pericardium: There is no evidence of pericardial effusion. Mitral Valve: The mitral valve is abnormal. There is mild thickening of the mitral valve leaflet(s). There is moderate calcification of the mitral valve leaflet(s). Trivial mitral valve regurgitation. No evidence of mitral valve stenosis. Tricuspid Valve: The tricuspid valve is grossly normal. Tricuspid valve regurgitation is trivial. No evidence of tricuspid stenosis. Aortic Valve: The aortic valve is grossly normal. Aortic valve regurgitation is trivial. No aortic stenosis is present. Aortic valve mean gradient measures 5.0 mmHg. Aortic valve peak gradient measures 10.4 mmHg. Aortic valve area, by VTI measures 2.50 cm. Pulmonic Valve: The pulmonic valve was not well visualized. Pulmonic valve regurgitation is not visualized. No evidence of pulmonic stenosis. Aorta: The aortic root, ascending aorta, aortic arch and descending aorta are all structurally normal, with no evidence of dilitation or obstruction. Venous: IVC assessment for right atrial pressure unable to be performed due to mechanical ventilation. IAS/Shunts: The atrial septum is grossly normal.  LEFT VENTRICLE PLAX 2D LVIDd:         4.00 cm   Diastology LVIDs:         2.40 cm   LV e' medial:    5.55 cm/s LV PW:         1.20 cm   LV E/e' medial:  13.9 LV IVS:         1.10 cm   LV e' lateral:   8.05 cm/s LVOT diam:     2.10 cm   LV E/e' lateral: 9.6 LV SV:         78 LV SV Index:   40 LVOT Area:     3.46 cm  RIGHT VENTRICLE             IVC RV Basal diam:  3.20 cm     IVC diam: 2.00 cm RV S prime:     14.10 cm/s TAPSE (M-mode): 2.2 cm LEFT ATRIUM             Index        RIGHT ATRIUM           Index LA diam:        3.90 cm 2.01 cm/m   RA Area:     15.10 cm LA Vol (A2C):   57.0 ml 29.33 ml/m  RA Volume:   34.10 ml  17.55 ml/m LA Vol (A4C):   58.1 ml 29.90 ml/m LA Biplane Vol: 59.5 ml 30.62 ml/m  AORTIC VALVE AV Area (Vmax):    2.52 cm AV Area (Vmean):   2.48 cm AV Area (VTI):     2.50 cm AV Vmax:           161.00 cm/s AV Vmean:          106.000 cm/s AV VTI:            0.313 m AV Peak Grad:      10.4 mmHg AV Mean Grad:      5.0 mmHg LVOT Vmax:         117.00 cm/s LVOT Vmean:        75.900 cm/s LVOT VTI:          0.226 m LVOT/AV VTI ratio: 0.72  AORTA Ao Root diam: 3.10 cm MITRAL VALVE MV Area (PHT): 3.17 cm     SHUNTS MV Decel Time: 239 msec     Systemic VTI:  0.23 m  MV E velocity: 77.40 cm/s   Systemic Diam: 2.10 cm MV A velocity: 108.00 cm/s MV E/A ratio:  0.72 Buford Dresser MD Electronically signed by Buford Dresser MD Signature Date/Time: 05/16/2022/1:16:18 PM    Final     PHYSICAL EXAM  Physical Exam  Constitutional: Appears well-developed and well-nourished middle-age African-American lady who is not in distress Cardiovascular: Normal rate and regular rhythm.  Respiratory: Effort normal, non-labored breathing  Neuro: Mental Status: Eyes open.  Globally aphasic with left gaze deviation, does not track or follow commands  Cranial Nerves: II: PERRL, 43m reactive III,IV, VI: EOMI without ptosis or diploplia. Left gaze deviation, does not cross midline. Does  consistently track X: cough and gag intact XI: Head turned to the left Motor: Tone is normal. Bulk is normal. Spontaneous movement noted on the left.  No movement on the right but  flaccid hemiplegia Sensory: Withdraws to pain on the left, minimally on the right    ASSESSMENT/PLAN Ms. SShakemia Maderais a 66y.o. female with history of HTN, hyperlipedmia, DM2, tobacco abuse presenting with waxing and waning stroke symptoms.  She last talked to family several days before admission.  Symptom discovery was at 2 PM on 8/26. Initial head CT was read with no acute intracranial process. Her initial examination for ED provider was notable for fluctuating right-sided weakness.  She was making repetitive statements ("I can't talk") and not following commands. She was taken to IR emergently for a mechanical thrombectomy and stent assisted angioplasty of the left ICA. Remained intubated post procedure. MRI and MRA show reocclusion of stent. Extubated 8/27.   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 Small vessel disease. Atrophy. ASPECTS 10.    CTA head & neck - Left posterior MCA territory infarct with core infarct volume of 62 mL. The posterior left M2 segment is occluded with poor collaterals. 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 High-grade stenosis or occlusion of the left P2 segment.  CT perfusion Core 615m Perfusion 12363mMismatch 61 MRI-  acute nonhemorrhagic infarct involving the posterior left MCA territory and posterior left insula. Additional punctate foci of acute nonhemorrhagic infarct are present along the watershed distribution. 5mm75mute nonhemorrhagic infarct in the medial left thalamus MRA  No flow in the left ICA. Mod stenosis of the left P2  2D Echo EF 65-70% LDL 25 HgbA1c 7.2 VTE  prophylaxis - SCDs No antithrombotic prior to admission, now on aspirin 81 mg daily and Brilinta (ticagrelor) 90 mg bid.  Therapy recommendations:  Pending Disposition:  pending  Occluded Left ICA anad MCA at M1 s/p mechanical thrombectomy with TICI2c revascularization  Stent assisted angioplasty of preocclusive left internal carotid artery at the bulb secondary to thick calcified atherosclerotic plaque Re occluded on MRA Continue brilinta  Hypertension Home meds:  lisinopril Unstable BP 120-140 per NIR  Cleviprex is off  Hyperlipidemia Home meds:  Atorvastatin '40mg'$  LDL 25, goal < 70 Continue statin at discharge  Diabetes type II Uncontrolled Home meds:  Metformin  HgbA1c 7.2, goal < 7.0 CBGs SSI  Other Stroke Risk Factors Advanced Age >/= 65  51garette smoker, advised to stop smoking Obesity, Body mass index is  31.92 kg/m., BMI >/= 30 associated with increased stroke risk, recommend weight loss, diet and exercise as appropriate   Other Active Problems Acute Respiratory Failure Extubated 8/26 CCM on board as needed for respiratory management  Dysphagia SLP consult placed  Hospital day # 2  Patient seen and examined by NP/APP with MD. MD to update note as needed.   Janine Ores, DNP, FNP-BC [facility] Pager: [phone]  I have personally obtained history,examined this patient, reviewed notes, independently viewed imaging studies, participated in medical decision making and plan of care.ROS completed by me personally and pertinent positives fully documented  I have made any additions or clarifications directly to the above note. Agree with note above.  Patient has several excluded herself.  Continue close neurological observation and mobilize out of bed.  Physical occupational and speech therapy consults.  Continue dual antiplatelet therapy and aggressive risk factor modification.  No family available at the bedside for discussion.  Discussed with Dr.  Shearon Stalls critical care medicine.This patient is critically ill and at significant risk of neurological worsening, death and care requires constant monitoring of vital signs, hemodynamics,respiratory and cardiac monitoring, extensive review of multiple databases, frequent neurological assessment, discussion with family, other specialists and medical decision making of high complexity.I have made any additions or clarifications directly to the above note.This critical care time does not reflect procedure time, or teaching time or supervisory time of PA/NP/Med Resident etc but could involve care discussion time.  I spent 30 minutes of neurocritical care time  in the care of  this patient.      Antony Contras, MD Medical Director [facility] Pager: [phone] 05/17/2022 2:12 PM    To contact Stroke Continuity provider, please refer to [email or web address] After hours, contact General Neurology

## 2022-05-17 NOTE — Evaluation (Signed)
Occupational Therapy Evaluation Patient Details Name: Bailey [name] MRN: [id number] DOB: 24-Jan-1956 Today's Date: 05/17/2022   History of Present Illness Pt is a 66 y.o. female who presented to the ED via EMS for AMS and R side weakness.  CTA revealed L MCA infarct. Pt underwent endovascular revascularization of occluded left internal carotid artery artery and left middle cerebral artery M1 8/26. Intubated 8/25 and self extubated 8/26. PMH:  HTN, hyperlipidemia, diabetes, tobacco abuse.   Clinical Impression   Bailey [name] was evaluated s/p the above admission list, she presents with communication impairments therefore she was unable to provide home set up and PLOF. Upon evaluation she has functional limitations due to flaccid R hemi body, R gaze, R inattention, impaired communication and cognition. Overall she requires total A for all aspects of her care. Once transferred to EOB with total A +2 pt was mod A-min G for sitting balance. OT to continue to follow acutely. Recommend d/c to AIR based on pt's great rehab potential.    Recommendations for follow up therapy are one component of a multi-disciplinary discharge planning process, led by the attending physician.  Recommendations may be updated based on patient status, additional functional criteria and insurance authorization.   Follow Up Recommendations  Acute inpatient rehab (3hours/day)    Assistance Recommended at Discharge Frequent or constant Supervision/Assistance  Patient can return home with the following A lot of help with walking and/or transfers;A lot of help with bathing/dressing/bathroom;Assistance with cooking/housework;Direct supervision/assist for medications management;Assistance with feeding;Direct supervision/assist for financial management;Assist for transportation;Help with stairs or ramp for entrance    Functional Status Assessment  Patient has had a recent decline in their functional status and demonstrates the ability  to make significant improvements in function in a reasonable and predictable amount of time.  Equipment Recommendations  Other (comment) (pending progression)    Recommendations for Other Services Rehab consult     Precautions / Restrictions Precautions Precautions: Fall;Other (comment) Precaution Comments: R hemiparesis, R inattention Restrictions Weight Bearing Restrictions: No      Mobility Bed Mobility Overal bed mobility: Needs Assistance Bed Mobility: Rolling, Supine to Sit, Sit to Supine Rolling: Total assist   Supine to sit: +2 for physical assistance, Total assist Sit to supine: Total assist, +2 for physical assistance   General bed mobility comments: assist with BLE and trunk, use of bed pad to scoot to EOB    Transfers                   General transfer comment: deferred      Balance Overall balance assessment: Needs assistance Sitting-balance support: Feet supported, Single extremity supported Sitting balance-Leahy Scale: Poor Sitting balance - Comments: initially mod assist to maintain balance EOB, progressed to short episode of sitting min guard (with R lean) Postural control: Right lateral lean                                 ADL either performed or assessed with clinical judgement   ADL Overall ADL's : Needs assistance/impaired                                       General ADL Comments: total A for all aspects of care     Vision Baseline Vision/History: 0 No visual deficits Vision Assessment?: Vision impaired- to be further tested in  functional context Additional Comments: R gaze, able to sustain attention to midline with increased effort and cues. May benefit from occlusion glasses to black R field            Pertinent Vitals/Pain Pain Assessment Pain Assessment: Faces Faces Pain Scale: No hurt Pain Location: no indicatio of pain throughout session Pain Intervention(s): Monitored during session      Hand Dominance Right   Extremity/Trunk Assessment Upper Extremity Assessment Upper Extremity Assessment: RUE deficits/detail;LUE deficits/detail RUE Deficits / Details: no active movement noted. Able to move through full PROM LUE Deficits / Details: moving against gravity throguhout full range - difficult to fully assess due to impiared communication/cognition   Lower Extremity Assessment Lower Extremity Assessment: Defer to PT evaluation RLE Deficits / Details: no active movement noted. Able to move through full PROM. No reaction to noxious stimuli.   Cervical / Trunk Assessment Cervical / Trunk Assessment: Normal   Communication Communication Communication: Receptive difficulties;Expressive difficulties   Cognition Arousal/Alertness: Awake/alert Behavior During Therapy: Flat affect Overall Cognitive Status: Difficult to assess                                 General Comments: global aphasia. Following simple commands < 25% of trials with increased time     General Comments  VSS on 2L            Home Living Family/patient expects to be discharged to:: Private residence Living Arrangements: Alone             Additional Comments: Per chart, pt from home alone. Pt unable to provide further history. No family available.      Prior Functioning/Environment Prior Level of Function : Patient poor historian/Family not available                        OT Problem List: Decreased strength;Decreased range of motion;Decreased activity tolerance;Impaired balance (sitting and/or standing);Impaired vision/perception;Decreased coordination;Decreased cognition;Decreased safety awareness;Decreased knowledge of use of DME or AE;Decreased knowledge of precautions;Impaired sensation;Impaired tone;Impaired UE functional use      OT Treatment/Interventions: Self-care/ADL training;Therapeutic exercise;DME and/or AE instruction;Therapeutic  activities;Patient/family education;Balance training    OT Goals(Current goals can be found in the care plan section) Acute Rehab OT Goals Patient Stated Goal: unable to state OT Goal Formulation: Patient unable to participate in goal setting Time For Goal Achievement: 05/31/22 Potential to Achieve Goals: Good  OT Frequency: Min 2X/week    Co-evaluation PT/OT/SLP Co-Evaluation/Treatment: Yes Reason for Co-Treatment: Complexity of the patient's impairments (multi-system involvement);Necessary to address cognition/behavior during functional activity;To address functional/ADL transfers PT goals addressed during session: Mobility/safety with mobility;Balance OT goals addressed during session: ADL's and self-care      AM-PAC OT "6 Clicks" Daily Activity     Outcome Measure Help from another person eating meals?: Total Help from another person taking care of personal grooming?: Total Help from another person toileting, which includes using toliet, bedpan, or urinal?: Total Help from another person bathing (including washing, rinsing, drying)?: Total Help from another person to put on and taking off regular upper body clothing?: Total Help from another person to put on and taking off regular lower body clothing?: Total 6 Click Score: 6   End of Session Equipment Utilized During Treatment: Oxygen Nurse Communication: Mobility status  Activity Tolerance: Patient tolerated treatment well Patient left: in bed;with call bell/phone within reach;with bed alarm set  OT Visit Diagnosis:  Unsteadiness on feet (R26.81);Other abnormalities of gait and mobility (R26.89);Muscle weakness (generalized) (M62.81);Cognitive communication deficit (R41.841);Hemiplegia and hemiparesis Symptoms and signs involving cognitive functions: Cerebral infarction Hemiplegia - Right/Left: Right Hemiplegia - caused by: Cerebral infarction                Time: 6761-9509 OT Time Calculation (min): 25 min Charges:  OT  General Charges $OT Visit: 1 Visit OT Evaluation $OT Eval Moderate Complexity: 1 Mod   Bulah Lurie A Randall Rampersad 05/17/2022, 9:57 AM

## 2022-05-17 NOTE — Progress Notes (Signed)
Assisted with video call to son.

## 2022-05-17 NOTE — Progress Notes (Signed)
eLink Physician-Brief Progress Note Patient Name: Akela Pocius DOB: [birth year] MRN: [id number]   Date of Service  05/17/2022  HPI/Events of Note  Arterial line waveform flat, appears to be clotted off.   Pt >24 hours from intervention, cleviprex off.  No lab draws due  at this time.   eICU Interventions  May remove arterial line.         [place] 05/17/2022, 5:03 AM

## 2022-05-17 NOTE — Progress Notes (Signed)
Inpatient Rehab Admissions Coordinator Note:   Per therapy patient was screened for CIR candidacy by Misael Mcgaha Danford Bad, CCC-SLP. At this time, pt has not yet attempted transfers and had increased difficulty following simple commands. Pt may have potential to progress to becoming a potential CIR candidate. CIR admissions team will follow to monitor for progress and participation with therapies. A consult order will be placed if pt appears to be an appropriate candidate.    Gayland Curry, [place], [place] Admissions Coordinator [phone] 05/17/22 12:48 PM

## 2022-05-17 NOTE — Evaluation (Signed)
Clinical/Bedside Swallow Evaluation Patient Details  Name: Bailey [name] MRN: [id number] Date of Birth: Aug 21, 1956  Today's Date: 05/17/2022 Time: SLP Start Time (ACUTE ONLY): 28 SLP Stop Time (ACUTE ONLY): 1016 SLP Time Calculation (min) (ACUTE ONLY): 9 min  Past Medical History:  Past Medical History:  Diagnosis Date   Arthritis    Cataract    COPD (chronic obstructive pulmonary disease) ([place])    Diabetes mellitus without complication ([place])    Fibrocystic breast disease July 2016   GERD (gastroesophageal reflux disease)    Hyperlipidemia    Hypertension    Past Surgical History:  Past Surgical History:  Procedure Laterality Date   BREAST BIOPSY Right 04/19/2015   benign   BREAST BIOPSY Right 11/01/2020   fibroadenoma   CATARACT EXTRACTION     CHOLECYSTECTOMY     COLONOSCOPY     GALLBLADDER SURGERY     UTERINE FIBROID SURGERY     HPI:  Pt is a 66 y.o. female who presented to the ED via EMS for AMS and R side weakness.  CTA revealed large L MCA infarct. Pt underwent endovascular revascularization of occluded left internal carotid artery artery and left middle cerebral artery M1 8/26. Intubated 8/25 and self extubated 8/26. PMH:  HTN, hyperlipidemia, diabetes, tobacco abuse.    Assessment / Plan / Recommendation  Clinical Impression  Pt was seen for preliminary bedside swallow assessment. RN placed NG tube this am.  She alerted to name, smiled in response to examiner, did not follow commands nor imitate functions.  SLP provided oral care. She accepted ice chips with adequate oral recognition and good effort to masticate.  A pharyngeal swallow was palpable, but likely delayed and there was mild, delayed throat-clearing post-swallow. Bailey [name] is dysphagic and not ready for an oral diet, but can begin therapeutic trials of ice chips with SLP. Will likely need an instrumental swallow study at some point. SLP will follow. SLP Visit Diagnosis: Dysphagia, oropharyngeal phase  (R13.12)    Aspiration Risk  Severe aspiration risk    Diet Recommendation   NPO  Medication Administration: Via alternative means    Other  Recommendations Oral Care Recommendations: Oral care QID    Recommendations for follow up therapy are one component of a multi-disciplinary discharge planning process, led by the attending physician.  Recommendations may be updated based on patient status, additional functional criteria and insurance authorization.  Follow up Recommendations Acute inpatient rehab (3hours/day)      Assistance Recommended at Discharge Frequent or constant Supervision/Assistance  Functional Status Assessment Patient has had a recent decline in their functional status and demonstrates the ability to make significant improvements in function in a reasonable and predictable amount of time.  Frequency and Duration min 3x week  2 weeks       Prognosis Prognosis for Safe Diet Advancement: Good      Swallow Study   General Date of Onset: 05/16/22 HPI: Pt is a 66 y.o. female who presented to the ED via EMS for AMS and R side weakness.  CTA revealed large L MCA infarct. Pt underwent endovascular revascularization of occluded left internal carotid artery artery and left middle cerebral artery M1 8/26. Intubated 8/25 and self extubated 8/26. PMH:  HTN, hyperlipidemia, diabetes, tobacco abuse. Type of Study: Bedside Swallow Evaluation Previous Swallow Assessment: no Diet Prior to this Study: NPO;NG Tube Temperature Spikes Noted: No Respiratory Status: Nasal cannula History of Recent Intubation: Yes Length of Intubations (days): 1 days (pt self extubated) Date extubated: 05/16/22  Behavior/Cognition: Alert;Doesn't follow directions Oral Cavity Assessment: Within Functional Limits Oral Care Completed by SLP: Yes Oral Cavity - Dentition: Missing dentition Self-Feeding Abilities: Total assist Patient Positioning: Upright in bed Baseline Vocal Quality: Not  observed Volitional Cough: Cognitively unable to elicit Volitional Swallow: Unable to elicit    Oral/Motor/Sensory Function Overall Oral Motor/Sensory Function: Other (comment) (no obvious facial asymmetry)   Ice Chips Ice chips: Impaired Presentation: Spoon Pharyngeal Phase Impairments: Suspected delayed Swallow;Throat Clearing - Delayed   Thin Liquid Thin Liquid: Not tested    Nectar Thick Nectar Thick Liquid: Not tested   Honey Thick Honey Thick Liquid: Not tested   Puree Puree: Not tested   Solid     Solid: Not tested     Estill Bamberg L. Tivis Ringer, MA CCC/SLP Clinical Specialist - Acute Care SLP Acute Rehabilitation Services Office number [phone]  Bailey [name] 05/17/2022,10:42 AM

## 2022-05-18 ENCOUNTER — Encounter (HOSPITAL_COMMUNITY): Payer: Self-pay | Admitting: Radiology

## 2022-05-18 DIAGNOSIS — I63232 Cerebral infarction due to unspecified occlusion or stenosis of left carotid arteries: Secondary | ICD-10-CM | POA: Diagnosis not present

## 2022-05-18 LAB — GLUCOSE, CAPILLARY
Glucose-Capillary: 171 mg/dL — ABNORMAL HIGH (ref 70–99)
Glucose-Capillary: 130 mg/dL — ABNORMAL HIGH (ref 70–99)
Glucose-Capillary: 208 mg/dL — ABNORMAL HIGH (ref 70–99)
Glucose-Capillary: 220 mg/dL — ABNORMAL HIGH (ref 70–99)
Glucose-Capillary: 225 mg/dL — ABNORMAL HIGH (ref 70–99)
Glucose-Capillary: 241 mg/dL — ABNORMAL HIGH (ref 70–99)

## 2022-05-18 LAB — HEMOGLOBIN A1C
Hgb A1c MFr Bld: 7 % — ABNORMAL HIGH (ref 4.8–5.6)
Mean Plasma Glucose: 154 mg/dL

## 2022-05-18 LAB — BASIC METABOLIC PANEL
Anion gap: 8 (ref 5–15)
BUN: 11 mg/dL (ref 8–23)
CO2: 18 mmol/L — ABNORMAL LOW (ref 22–32)
Calcium: 8.2 mg/dL — ABNORMAL LOW (ref 8.9–10.3)
Chloride: 111 mmol/L (ref 98–111)
Creatinine, Ser: 0.53 mg/dL (ref 0.44–1.00)
GFR, Estimated: >60 mL/min (ref >60)
Glucose, Bld: 226 mg/dL — ABNORMAL HIGH (ref 70–99)
Potassium: 4.2 mmol/L (ref 3.5–5.1)
Sodium: 137 mmol/L (ref 135–145)

## 2022-05-18 LAB — POTASSIUM: Potassium: 4.1 mmol/L (ref 3.5–5.1)

## 2022-05-18 LAB — PHOSPHORUS: Phosphorus: 2.5 mg/dL (ref 2.5–4.6)

## 2022-05-18 LAB — MAGNESIUM: Magnesium: 2.1 mg/dL (ref 1.7–2.4)

## 2022-05-18 MED ORDER — ENOXAPARIN SODIUM 40 MG/0.4ML IJ SOSY
40.0000 mg | PREFILLED_SYRINGE | INTRAMUSCULAR | Status: DC
  Administered 2022-05-18 – 2022-05-28 (×11): 40 mg via SUBCUTANEOUS
  Filled 2022-05-18 (×11): qty 0.4

## 2022-05-18 MED ORDER — POLYETHYLENE GLYCOL 3350 17 G PO PACK
17.0000 g | PACK | Freq: Two times a day (BID) | ORAL | Status: DC
  Administered 2022-05-18 – 2022-05-23 (×6): 17 g
  Filled 2022-05-18 (×9): qty 1

## 2022-05-18 MED ORDER — JEVITY 1.5 CAL/FIBER PO LIQD
1000.0000 mL | ORAL | Status: DC
Start: 2022-05-18 — End: 2022-05-22
  Administered 2022-05-18 – 2022-05-21 (×4): 1000 mL
  Filled 2022-05-18 (×7): qty 1000

## 2022-05-18 MED ORDER — ORAL CARE MOUTH RINSE: 15.0000 mL | OROMUCOSAL | Status: DC | PRN

## 2022-05-18 MED ORDER — ORAL CARE MOUTH RINSE
15.0000 mL | OROMUCOSAL | Status: DC
  Administered 2022-05-18 – 2022-05-28 (×41): 15 mL via OROMUCOSAL

## 2022-05-18 NOTE — Inpatient Diabetes Management (Signed)
Inpatient Diabetes Program Recommendations  AACE/ADA: New Consensus Statement on Inpatient Glycemic Control   Target Ranges:  Prepandial:   less than 140 mg/dL      Peak postprandial:   less than 180 mg/dL (1-2 hours)      Critically ill patients:  140 - 180 mg/dL     Latest Reference Range & Units 05/18/22 03:18 05/18/22 07:54 05/18/22 11:49  Glucose-Capillary 70 - 99 mg/dL 171 (H) 208 (H) 220 (H)    Latest Reference Range & Units 05/17/22 07:12 05/17/22 10:57 05/17/22 15:15 05/17/22 19:24 05/17/22 23:28  Glucose-Capillary 70 - 99 mg/dL 171 (H) 148 (H) 139 (H) 132 (H) 169 (H)   Review of Glycemic Control  Diabetes history: DM2 Outpatient Diabetes medications: Invokana 100 mg daily, Amaryl 8 mg QAM, Metformin 1000 mg BID Current orders for Inpatient glycemic control: Novolog 0-6 units Q4H; Jevity @ 55 ml/hr  Inpatient Diabetes Program Recommendations:    Insulin:  Please consider ordering Novolog 3 units Q4H for tube feeding coverage. If tube feeding is stopped or held then Novolog tube feeding coverage should also be stopped or held.  May want to consider increasing Novolog correction to 0-9 units Q4H.  Thanks, Barnie Alderman, RN, MSN, [place] Diabetes Coordinator Inpatient Diabetes Program [phone] (Team Pager from 8am to [place])

## 2022-05-18 NOTE — Progress Notes (Signed)
Initial Nutrition Assessment  DOCUMENTATION CODES:   Not applicable  INTERVENTION:   Tube feeding via NG:  D/C Osmolite 1.5  Jevity 1.5 at 55 ml/h (1320 ml per day) Prosource TF20 60 ml daily  Provides 2060 kcal, 102 gm protein, 1003 ml free water daily    NUTRITION DIAGNOSIS:   Inadequate oral intake related to inability to eat as evidenced by NPO status.  GOAL:   Patient will meet greater than or equal to 90% of their needs  MONITOR:   TF tolerance  REASON FOR ASSESSMENT:   Consult Enteral/tube feeding initiation and management  ASSESSMENT:   Pt with PMH of HTN, HLD, DM, CODP, GERD, tobacco abuse admitted with L MCA stroke s/p mechanical thrombectomy of L ICA/MCA/ACA.   Pt discussed during ICU rounds and with RN.  Per stroke service pt stable for transfer out of ICU. Therapy recommends CIR.   Pt unable to answer any questions but does move L side to assist with exam.    Medications reviewed and include: colace, SSI  Labs reviewed:  A1C: 7.0 CBG's: 169-220  16 F NG: tip in stomach   NUTRITION - FOCUSED PHYSICAL EXAM:  Flowsheet Row Most Recent Value  Orbital Region No depletion  Upper Arm Region No depletion  Thoracic and Lumbar Region No depletion  Buccal Region No depletion  Temple Region No depletion  Clavicle Bone Region No depletion  Clavicle and Acromion Bone Region No depletion  Scapular Bone Region No depletion  Dorsal Hand No depletion  Patellar Region Moderate depletion  Anterior Thigh Region Moderate depletion  Posterior Calf Region Mild depletion  Edema (RD Assessment) None  Hair Reviewed  Eyes Reviewed  Mouth Unable to assess  Skin Reviewed  Nails Reviewed       Diet Order:   Diet Order             Diet NPO time specified  Diet effective now                   EDUCATION NEEDS:   No education needs have been identified at this time  Skin:  Skin Assessment: Reviewed RN Assessment  Last BM:  unknown  Height:    Ht Readings from Last 1 Encounters:  05/16/22 '5\' 5"'$  (1.651 m)    Weight:   Wt Readings from Last 1 Encounters:  05/18/22 84.6 kg    BMI:  Body mass index is 31.04 kg/m.  Estimated Nutritional Needs:   Kcal:  1700-2000kcal/day  Protein:  85-100g/day  Fluid:  1.4-1.6L/day  Lockie Pares., RD, LDN, CNSC See AMiON for contact information

## 2022-05-18 NOTE — Progress Notes (Signed)
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  Not following commands. Aphasic.  Right-sided neglect. Withdrawals R toes to pain only.  Per RN, some random movement of R arm, not seen this am.  Allergies: Patient has no known allergies.  Medications:  Current Facility-Administered Medications:    0.9 %  sodium chloride infusion, , Intravenous, Continuous, Deveshwar, Sanjeev, MD, Last Rate: 75 mL/hr at 05/18/22 0800, Infusion Verify at 05/18/22 0800   acetaminophen (TYLENOL) tablet 650 mg, 650 mg, Oral, Q4H PRN **OR** acetaminophen (TYLENOL) 160 MG/5ML solution 650 mg, 650 mg, Per Tube, Q4H PRN, 650 mg at 05/18/22 0800 **OR** acetaminophen (TYLENOL) suppository 650 mg, 650 mg, Rectal, Q4H PRN, Deveshwar, Sanjeev, MD   aspirin chewable tablet 81 mg, 81 mg, Oral, Daily **OR** aspirin chewable tablet 81 mg, 81 mg, Per Tube, Daily, Deveshwar, Sanjeev, MD, 81 mg at 05/17/22 1052   Chlorhexidine Gluconate Cloth 2 % PADS 6 each, 6 each, Topical, Daily, Bhagat, Srishti L, MD, 6 each at 05/17/22 0945   docusate (COLACE) 50 MG/5ML liquid 100 mg, 100 mg, Per Tube, BID, Audria Nine, DO, 100 mg at 05/17/22 2144   feeding supplement (OSMOLITE 1.5 CAL) liquid 1,000 mL, 1,000 mL, Per Tube, Continuous, Spero Geralds, MD, Last Rate: 35 mL/hr at 05/18/22 0800, Infusion Verify at 05/18/22 0800   feeding supplement (PROSource TF20) liquid 60 mL, 60 mL, Per Tube, Daily, Spero Geralds, MD   insulin aspart (novoLOG) injection 0-6 Units, 0-6 Units, Subcutaneous, Q4H, Audria Nine, DO, 2 Units at 05/18/22 0802   labetalol (NORMODYNE) injection 20 mg, 20 mg, Intravenous, Q10 min PRN, Charlean Merl, Marcelino Scot, NP, 20 mg at 05/18/22 0715   lisinopril (ZESTRIL) tablet 40 mg, 40 mg, Per Tube, Daily, Charlean Merl, Marcelino Scot, NP, 40 mg at 05/17/22 1051   Oral care mouth rinse, 15  mL, Mouth Rinse, 4 times per day, Garvin Fila, MD, 15 mL at 05/18/22 0804   Oral care mouth rinse, 15 mL, Mouth Rinse, PRN, Garvin Fila, MD   polyethylene glycol (MIRALAX / GLYCOLAX) packet 17 g, 17 g, Per Tube, Daily, Audria Nine, DO, 17 g at 05/17/22 1052   senna-docusate (Senokot-S) tablet 1 tablet, 1 tablet, Per Tube, QHS PRN, Bhagat, Srishti L, MD   sodium chloride 0.9 % bolus 1,000 mL, 1,000 mL, Intravenous, Once, Bhagat, Srishti L, MD   sodium chloride flush (NS) 0.9 % injection 3 mL, 3 mL, Intravenous, Once, Blanchie Dessert, MD   ticagrelor (BRILINTA) tablet 90 mg, 90 mg, Oral, BID **OR** ticagrelor (BRILINTA) tablet 90 mg, 90 mg, Per Tube, BID, Deveshwar, Sanjeev, MD, 90 mg at 05/17/22 2144    Vital Signs: BP (!) 145/59 (BP Location: Left Arm)   Pulse 81   Temp 99.8 F (37.7 C) (Axillary)   Resp 15   Ht '5\' 5"'$  (1.651 m)   Wt 186 lb 8.2 oz (84.6 kg)   SpO2 98%   BMI 31.04 kg/m   Physical Exam Opens eyes to voice, but non-communicative and not following any simple commands. Neuro: Aphasic, Right sided facial droop.Tracking to left, no movement of eyes to right across midline.  Will not move head to the right. Spontaneous movement in left hand and leg.  Minimal withdrawal to pain with toes on right.   Imaging: DG Abd Portable 1V  Result Date: 05/17/2022 CLINICAL DATA:  NG tube placement EXAM: PORTABLE ABDOMEN -  1 VIEW COMPARISON:  None Available. FINDINGS: The NG tube side port and distal tip are in the left upper quadrant, likely in the stomach. IMPRESSION: The NG tube is in good position with the side port and distal tip in the region of the stomach. Electronically Signed   By: Dorise Bullion III M.D.   On: 05/17/2022 09:33   MR BRAIN WO CONTRAST  Result Date: 05/16/2022 CLINICAL DATA:  Stroke, follow-up. Status post reperfusion of left cervical internal carotid artery and left M2 vessels. EXAM: MRI HEAD WITHOUT CONTRAST TECHNIQUE: Multiplanar, multiecho pulse  sequences of the brain and surrounding structures were obtained without intravenous contrast. 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 Additional punctate foci of restricted diffusion are present along the watershed distribution. A 5 mm infarct is present medial left thalamus. Distal left ACA territory infarct extends over 4 cm. T2 and FLAIR hyperintensity associated with the areas acute. Additional moderate periventricular and subcortical white matter changes are present bilaterally, left greater than right. Basal ganglia are within normal. No other significant ischemic changes are present in thalami. The infarcted cortex is swollen with partial effacement of the sulci. The internal auditory canals are within normal limits. Insert normal brainstem Vascular: The left internal carotid artery is occluded below the ophthalmic segment. Signal in the posterior left MCA branch vessels is consistent slow or occluded. Normal flow voids are present in the right internal carotid artery proximal anterior cerebral arteries. Normal flow voids are present posterior circulation. Skull and upper cervical spine: The craniocervical junction is normal. Upper cervical spine is within normal limits. Marrow signal is unremarkable. Sinuses/Orbits: Mild mucosal thickening is present inferior maxillary sinuses bilaterally. Fluid is present in the nasopharynx. Bilateral mastoid effusions are present. Paranasal sinuses are otherwise clear. A right lens replacement is present. Globes and orbits are otherwise within normal limits. IMPRESSION: 1. Acute nonhemorrhagic infarct involving the posterior left MCA territory and posterior left insula. 2. Distal left PCA territory infarct is outside the initial area of ischemic penumbra 3. Additional punctate foci of acute  nonhemorrhagic infarct are present along the watershed distribution. 4. 5 mm acute nonhemorrhagic infarct in the medial left thalamus. 5. Moderate periventricular and subcortical white matter changes bilaterally are moderately advanced for age. This likely reflects the sequela of chronic microvascular ischemia. The above was relayed via text pager to Dr. Leonie Man on 05/16/2022 at 14:19 . Electronically Signed   By: San Morelle M.D.   On: 05/16/2022 16:27   MR ANGIO NECK WO CONTRAST  Result Date: 05/16/2022 CLINICAL DATA:  Status post reperfusion of left ICA occlusion. EXAM: MRA NECK WITHOUT CONTRAST TECHNIQUE: Angiographic images of the neck were acquired using MRA technique without intravenous contrast. Carotid stenosis measurements (when applicable) are obtained utilizing NASCET criteria, using the distal internal carotid diameter as the denominator. COMPARISON:  Cerebral and carotid angiography 05/15/2022 FINDINGS: Aortic arch: Moderate signal loss is present at the origin of the left subclavian artery. Mild narrowing is present in the proximal left common carotid artery. Right carotid system: The right common carotid artery is within normal limits. Bifurcation is unremarkable. Mild narrowing is present in the proximal right ICA, less severe than suggested on the CTA. The more distal right internal carotid artery is within normal limits. Left carotid system: No flow signal is present in the left internal carotid artery in the neck. Vertebral arteries: The left  vertebral artery is the dominant vessel. Flow is antegrade in both vertebral arteries in the neck. Other: None. IMPRESSION: 1. No flow signal in the left internal carotid artery in the neck. The vessel appears to have reoccluded. 2. Mild narrowing of the proximal left common carotid artery. 3. Mild narrowing of the proximal right ICA is less severe than suggested on the CTA. 4. 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 The  above was relayed via text pager to Dr. Leonie Man On 05/16/2022 at 14:19 . Electronically Signed   By: San Morelle M.D.   On: 05/16/2022 14:22   MR ANGIO HEAD WO CONTRAST  Result Date: 05/16/2022 CLINICAL DATA:  Left MCA territory infarct. Mechanical thrombectomy and reperfusion. EXAM: MRA HEAD WITHOUT CONTRAST TECHNIQUE: Angiographic images of the Circle of Willis were acquired using MRA technique without intravenous contrast. COMPARISON:  Cerebral arteriogram 05/15/2022 FINDINGS: Anterior circulation: No flow is present in the left internal carotid proximal to the ICA terminus. Flow is reconstituted via a left posterior communicating artery. Flow is present in the left M1 segment. The anterior left M2 segment and branch vessels are visualized. Right internal carotid artery demonstrates mild atherosclerotic changes within the cavernous segment without significant stenosis greater than 50%. The right A1 and M1 segments are normal. Anterior communicating artery is patent. The right ACA and MCA branch vessels are within normal limits. There is some attenuation distal left ACA branch vessels. Posterior circulation: The left vertebral artery is the dominant vessel. The vertebrobasilar junction basilar artery is normal. Prominent AICA vessels are present. Both posterior cerebral arteries originate from basilar tip. Moderate stenosis is present left P2 segment with asymmetric attenuation distal left PCA branches. Anatomic variants: None IMPRESSION: 1. No flow in the left internal carotid artery proximal to the ICA terminus. Flow is reconstituted via a left posterior communicating artery. 2. The left anterior communicating artery is patent. 3. Moderate stenosis of the left P2 segment with asymmetric attenuation distal left PCA branch vessels. Electronically Signed   By: San Morelle M.D.   On: 05/16/2022 14:17   ECHOCARDIOGRAM COMPLETE  Result Date: 05/16/2022    ECHOCARDIOGRAM REPORT   Patient Name:    Bailey [name] Date of Exam: 05/16/2022 Medical Rec #:  [id number]           Height:       65.0 in Accession #:    [id number]          Weight:       191.8 lb Date of Birth:  [birth year]           BSA:          1.943 m Patient Age:    66 years            BP:           124/45 mmHg Patient Gender: F                   HR:           78 bpm. Exam Location:  Inpatient Procedure: 2D Echo, Cardiac Doppler and Color Doppler Indications:    Stroke  History:        Patient has no prior history of Echocardiogram examinations.                 Respiratory failure requiring mechanical ventilation.  Sonographer:    [name] RDCS Referring Phys: Lorenza Chick  Sonographer Comments: Echo performed with patient supine  and on artificial respirator. IMPRESSIONS  1. Left ventricular ejection fraction, by estimation, is 65 to 70%. The left ventricle has normal function. The left ventricle has no regional wall motion abnormalities. Left ventricular diastolic parameters are indeterminate.  2. Right ventricular systolic function is normal. The right ventricular size is normal.  3. Left atrial size was mildly dilated.  4. The mitral valve is abnormal. Trivial mitral valve regurgitation. No evidence of mitral stenosis.  5. The aortic valve is grossly normal. Aortic valve regurgitation is trivial. No aortic stenosis is present. Comparison(s): No prior Echocardiogram. Conclusion(s)/Recommendation(s): Otherwise normal echocardiogram, with minor abnormalities described in the report. No clear cardiac source of embolism. Consider TEE once clinically stable. FINDINGS  Left Ventricle: Left ventricular ejection fraction, by estimation, is 65 to 70%. The left ventricle has normal function. The left ventricle has no regional wall motion abnormalities. The left ventricular internal cavity size was normal in size. There is  borderline left ventricular hypertrophy. Left ventricular diastolic parameters are indeterminate. Right Ventricle: The right  ventricular size is normal. Right vetricular wall thickness was not well visualized. Right ventricular systolic function is normal. Left Atrium: Left atrial size was mildly dilated. Right Atrium: Right atrial size was normal in size. Pericardium: There is no evidence of pericardial effusion. Mitral Valve: The mitral valve is abnormal. There is mild thickening of the mitral valve leaflet(s). There is moderate calcification of the mitral valve leaflet(s). Trivial mitral valve regurgitation. No evidence of mitral valve stenosis. Tricuspid Valve: The tricuspid valve is grossly normal. Tricuspid valve regurgitation is trivial. No evidence of tricuspid stenosis. Aortic Valve: The aortic valve is grossly normal. Aortic valve regurgitation is trivial. No aortic stenosis is present. Aortic valve mean gradient measures 5.0 mmHg. Aortic valve peak gradient measures 10.4 mmHg. Aortic valve area, by VTI measures 2.50 cm. Pulmonic Valve: The pulmonic valve was not well visualized. Pulmonic valve regurgitation is not visualized. No evidence of pulmonic stenosis. Aorta: The aortic root, ascending aorta, aortic arch and descending aorta are all structurally normal, with no evidence of dilitation or obstruction. Venous: IVC assessment for right atrial pressure unable to be performed due to mechanical ventilation. IAS/Shunts: The atrial septum is grossly normal.  LEFT VENTRICLE PLAX 2D LVIDd:         4.00 cm   Diastology LVIDs:         2.40 cm   LV e' medial:    5.55 cm/s LV PW:         1.20 cm   LV E/e' medial:  13.9 LV IVS:        1.10 cm   LV e' lateral:   8.05 cm/s LVOT diam:     2.10 cm   LV E/e' lateral: 9.6 LV SV:         78 LV SV Index:   40 LVOT Area:     3.46 cm  RIGHT VENTRICLE             IVC RV Basal diam:  3.20 cm     IVC diam: 2.00 cm RV S prime:     14.10 cm/s TAPSE (M-mode): 2.2 cm LEFT ATRIUM             Index        RIGHT ATRIUM           Index LA diam:        3.90 cm 2.01 cm/m   RA Area:     15.10 cm LA Vol  (A2C):  57.0 ml 29.33 ml/m  RA Volume:   34.10 ml  17.55 ml/m LA Vol (A4C):   58.1 ml 29.90 ml/m LA Biplane Vol: 59.5 ml 30.62 ml/m  AORTIC VALVE AV Area (Vmax):    2.52 cm AV Area (Vmean):   2.48 cm AV Area (VTI):     2.50 cm AV Vmax:           161.00 cm/s AV Vmean:          106.000 cm/s AV VTI:            0.313 m AV Peak Grad:      10.4 mmHg AV Mean Grad:      5.0 mmHg LVOT Vmax:         117.00 cm/s LVOT Vmean:        75.900 cm/s LVOT VTI:          0.226 m LVOT/AV VTI ratio: 0.72  AORTA Ao Root diam: 3.10 cm MITRAL VALVE MV Area (PHT): 3.17 cm     SHUNTS MV Decel Time: 239 msec     Systemic VTI:  0.23 m MV E velocity: 77.40 cm/s   Systemic Diam: 2.10 cm MV A velocity: 108.00 cm/s MV E/A ratio:  0.72 Buford Dresser MD Electronically signed by Buford Dresser MD Signature Date/Time: 05/16/2022/1:16:18 PM    Final    CT HEAD WO CONTRAST (5MM)  Result Date: 05/16/2022 CLINICAL DATA:  Stroke follow-up EXAM: CT HEAD WITHOUT CONTRAST TECHNIQUE: Contiguous axial images were obtained from the base of the skull through the vertex without intravenous contrast. RADIATION DOSE REDUCTION: This exam was performed according to the departmental dose-optimization program which includes automated exposure control, adjustment of the mA and/or kV according to patient size and/or use of iterative reconstruction technique. COMPARISON:  Flat plate CT from yesterday FINDINGS: Brain: Cytotoxic edema appearance in the low left parietal and posterior temporal cortex. High-density in the deep white matter on the left, more faintly seen at the left putamen, some combination of hemorrhage and contrast staining, non progressed. No hydrocephalus. Vascular: No focal hyperdensity. Skull: No acute finding Sinuses/Orbits: No acute finding IMPRESSION: 1. Contrast staining versus hemorrhage without mass effect, stable from intraprocedural CT yesterday. 2.  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 Electronically  Signed   By: Jorje Guild M.D.   On: 05/16/2022 05:09   CT ANGIO HEAD NECK W WO CM W PERF (CODE STROKE)  Result Date: 05/15/2022 CLINICAL DATA:  Right-sided weakness.  Left MCA territory infarcts. EXAM: CT ANGIOGRAPHY HEAD AND NECK CT PERFUSION BRAIN TECHNIQUE: Multidetector CT imaging of the head and neck was performed using the standard protocol during bolus administration of intravenous contrast. Multiplanar CT image reconstructions and MIPs were obtained to evaluate the vascular anatomy. Carotid stenosis measurements (when applicable) are obtained utilizing NASCET criteria, using the distal internal carotid diameter as the denominator. Multiphase CT imaging of the brain was performed following IV bolus contrast injection. Subsequent parametric perfusion maps were calculated using RAPID software. RADIATION DOSE REDUCTION: This exam was performed according to the departmental dose-optimization program which includes automated exposure control, adjustment of the mA and/or kV according to patient size and/or use of iterative reconstruction technique. CONTRAST:  169m OMNIPAQUE IOHEXOL 350 MG/ML SOLN COMPARISON:  CT HEAD without contrast 05/15/2022 9:34 P.M. FINDINGS: CTA NECK FINDINGS Aortic arch: Atherosclerotic changes are present the aortic arch and great vessel origins. No significant stenosis or aneurysm is present. Narrowing of less than 50% is present at the origin of the  left common carotid artery due to noncalcified plaque. Right carotid system: Right common carotid artery is within normal limits. Calcified and noncalcified plaque bifurcation results in narrowing of the proximal right ICA. The lumen is narrowed to 1.5 mm. This compares with a more normal distal segment of 4 mm just below the skull base. There is some irregularity in the mid right ICA. Left carotid system: The left common carotid artery is within normal limits beyond the proximal segment. The left internal carotid artery is occluded  without significant reconstitution in the neck. Vertebral arteries: The left vertebral artery is the dominant vessel. The right vertebral artery is occluded proximally, reconstituted at the level of C6. Additional segmental narrowing is present in the right P2 segment. The more distal right vertebral artery is more normal. No significant stenosis is present in the dominant left vertebral artery. Skeleton: Mild degenerative changes are present in the lower cervical spine. No focal osseous lesions are present. Alignment is anatomic. Straightening of the normal cervical lordosis is present. Other neck: Soft tissues the neck are otherwise unremarkable. Salivary glands are within normal limits. Thyroid is normal. No significant adenopathy is present. No focal mucosal or submucosal lesions are present. Upper chest: The lung apices are clear. Thoracic inlet is within normal limits. Review of the MIP images confirms the above findings CTA HEAD FINDINGS Anterior circulation: Atherosclerotic calcifications are present within the cavernous right ICA. Narrowing of less than 50% is noted relative to the more distal normal ICA terminus. The left cavernous ICA is reconstituted above the skull base. Dense calcifications are present in the anterior genu. There is some flow in the left ICA terminus. The right M1 and A1 segments are normal. The anterior communicating artery is patent. The left A1 segment is narrowed. Mild narrowing is present in the proximal left M1 segment. Contrast opacification is present in the distal left M1 segment. The right MCA branch vessels and ACA branch vessels are within normal limits. Anterior MCA branch vessels are intact. The posterior left M2 segment is occluded minimal collateral vessels are present. Posterior circulation: The left vertebral artery is dominant vessel. PICA origins are visualized and normal. Vertebrobasilar junction and basilar artery normal. Both posterior cerebral arteries originate  from the basilar tip. Right PCA vessels within normal limits. High-grade stenosis or occlusion is present in the left P2 segment. Distal branch vessels are partially reconstituted. Venous sinuses: The dural sinuses are patent. The straight sinus deep cerebral veins are intact. Cortical veins are within normal limits. No significant vascular malformation is evident. Anatomic variants: None Review of the MIP images confirms the above findings CT Brain Perfusion Findings: ASPECTS: 6/10 CBF (<30%) Volume: 2m Perfusion (Tmax>6.0s) volume: 1251mMismatch Volume: 6132mnfarction Location:Left posterior MCA territory These results were called by telephone at the time of interpretation on 05/15/2022 at 9:50 Pm to provider [facility] Incwho verbally acknowledged these results. IMPRESSION: 1. 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 2. The posterior left M2 segment is occluded with poor collaterals. 3. 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 4. High-grade stenosis or occlusion of the left P2 segment. 5. The left A1 segment is narrowed. 6. Atherosclerotic changes within the cavernous right ICA without significant stenosis. 7. Narrowing of less than 50% at the origin of the left common carotid artery. 8. 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 9. Aortic Atherosclerosis (ICD10-I70.0). These results were called by telephone at the time of interpretation on 05/15/2022 at 9:50pm to provider [facility] , who verbally acknowledged these results. Electronically Signed   By: San Morelle M.D.   On: 05/15/2022 22:17   CT HEAD CODE STROKE WO CONTRAST  Addendum Date: 05/15/2022   ADDENDUM REPORT: 05/15/2022 21:49 ADDENDUM: After discussion with Dr. Curly Shores aspects is 6/10 within additional point deducted at the  ganglionic level. Electronically Signed   By: San Morelle M.D.   On: 05/15/2022 21:49   Result Date: 05/15/2022 CLINICAL DATA:  Code stroke. Left MCA syndrome. Last known well 2 days ago. EXAM: CT HEAD WITHOUT CONTRAST TECHNIQUE: Contiguous axial images were obtained from the base of the skull through the vertex without intravenous contrast. RADIATION DOSE REDUCTION: This exam was performed according to the departmental dose-optimization program which includes automated exposure control, adjustment of the mA and/or kV according to patient size and/or use of iterative reconstruction technique. COMPARISON:  CT head without contrast 05/15/2022 at 5:23 p.m. FINDINGS: Brain: Interval progression of loss of gray-white differentiation involves the posterior left insular cortex the posterior left superior temporal gyrus and left parietal lobe. Sulci are effaced. No acute hemorrhage is present. Basal ganglia are intact. Scattered subcortical T2 hyperintensities are otherwise stable. The brainstem and cerebellum are within normal limits. Vascular: Atherosclerotic calcifications are present within the cavernous internal carotid arteries bilaterally. Hyperdense left M2 branches are noted. Skull: Calvarium is intact. No focal lytic or blastic lesions are present. No significant extracranial soft tissue lesion is present. Sinuses/Orbits: Right lens replacement is present. Globes and orbits are otherwise within normal limits. The paranasal sinuses and mastoid air cells are clear. ASPECTS [facility] Stroke Program Early CT Score) - Ganglionic level infarction (caudate, lentiform nuclei, internal capsule, insula, M1-M3 cortex): 5/7 - Supraganglionic infarction (M4-M6 cortex): 2/3 Total score (0-10 with 10 being normal): 7/10 IMPRESSION: 1. Interval progression of loss of gray-white differentiation involving the posterior left insular cortex the posterior left superior temporal gyrus and left parietal lobe compatible with  acute/subacute infarct. 2. Hyperdense left M2 branches compatible with acute thrombus. 3. ASPECTS is 7/10. 4. Stable atrophy and white matter disease. This likely reflects the sequela of chronic microvascular ischemia. The above was relayed via text pager to Dr. Lesleigh Noe on 05/15/2022 at 21:43 . Electronically Signed: By: San Morelle M.D. On: 05/15/2022 21:43   DG Chest Portable 1 View  Result Date: 05/15/2022 CLINICAL DATA:  Altered mental status EXAM: PORTABLE CHEST 1 VIEW COMPARISON:  Chest x-ray dated June 23, 2021 FINDINGS: The heart size and mediastinal contours are within normal limits. Both lungs are clear. The visualized skeletal structures are unremarkable. IMPRESSION: No active disease. Electronically Signed   By: Yetta Glassman M.D.   On: 05/15/2022 17:47   CT HEAD WO CONTRAST (5MM)  Result Date: 05/15/2022 CLINICAL DATA:  Neuro deficit. Suspected stroke. RIGHT-sided weakness. RIGHT-sided facial droop. EXAM: CT HEAD WITHOUT CONTRAST TECHNIQUE: Contiguous axial images were obtained from the base of the skull through the vertex without intravenous contrast. RADIATION DOSE REDUCTION: This exam was performed according to the departmental dose-optimization program which includes automated exposure control, adjustment of the mA and/or kV according to patient size and/or use of iterative reconstruction technique. COMPARISON:  None Available. FINDINGS: Brain: There is central and cortical atrophy. Minimal focal areas of periventricular white matter changes are consistent with small vessel disease. There is no intra or extra-axial fluid collection or mass  lesion. The basilar cisterns and ventricles have a normal appearance. There is no CT evidence for acute infarction or hemorrhage. Vascular: No hyperdense vessel or unexpected calcification. Skull: Normal. Negative for fracture or focal lesion. Sinuses/Orbits: No acute finding. Other: None. IMPRESSION: No evidence for acute intracranial  abnormality. Electronically Signed   By: Nolon Nations M.D.   On: 05/15/2022 17:23    Labs:  CBC: Recent Labs    06/23/21 0723 03/30/22 1920 05/15/22 1656 05/15/22 1703 05/15/22 1824 05/16/22 0304 05/16/22 0405  WBC 9.5 10.4 6.9  --   --   --  12.1*  HGB 13.5 11.8* 6.1* 8.8* 12.5 11.2* 10.7*  HCT 43.8 38.4 20.3* 26.0* 41.1 33.0* 32.5*  PLT 318 308 158  --   --   --  272     COAGS: Recent Labs    05/15/22 1656  INR 1.0  APTT 23*     BMP: Recent Labs    03/30/22 1920 05/15/22 1656 05/15/22 1703 05/15/22 1824 05/16/22 0304 05/16/22 0405 05/18/22 0241  NA 136 144 146* 137 138 136  --   K 3.8 <2.0* 2.6* 4.3 3.7 3.5 4.1  CL 105 129* 116* 107  --  107  --   CO2 22 12*  --  20*  --  20*  --   GLUCOSE 133* 79 88 130*  --  214*  --   BUN 12 5* 6* 10  --  7*  --   CALCIUM 9.4 4.3*  --  9.8  --  8.5*  --   CREATININE 0.58 <0.30* <0.20* 0.62  --  0.54  --   GFRNONAA >60 NOT CALCULATED  --  >60  --  >60  --      LIVER FUNCTION TESTS: Recent Labs    03/30/22 1920 05/15/22 1656 05/15/22 1824 05/16/22 0405  BILITOT 0.4 <0.1* 0.4 0.5  AST 15 8* 20 13*  ALT '19 9 19 16  '$ ALKPHOS 89 37* 86 71  PROT 6.6 <3.0* 7.2 5.7*  ALBUMIN 3.4* <1.5* 3.6 2.9*     Assessment and Plan: 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 Also with stent-assisted angioplasty of preocclusive L ICA. Appears to be re-occluded on MR Patient extubated.   Neuro exam largely unchanged.  Being worked up for [employer] admission.  On Brilinta '90mg'$  BID.   NIR to follow.   Electronically Signed: Ascencion Dike, PA-C 05/18/2022, 9:02 AM   I spent a total of 15 Minutes at the the patient's bedside AND on the patient's hospital floor or unit, greater than 50% of which was counseling/coordinating care for L MCA occlusion

## 2022-05-18 NOTE — Progress Notes (Addendum)
STROKE TEAM PROGRESS NOTE   INTERVAL HISTORY No family at the bedside. Patient's vital signs have been stable and she is ready to transfer out of the ICU.  Will place Cortrak today. She remains aphasic and cannot follow only occasional midline and one-step commands intermittently.  Continues to have left gaze deviation and dense right hemiplegia.  Vital signs stable.  Sugars are elevated will increase meal coverage with insulin. Vitals:   05/18/22 0755 05/18/22 0800 05/18/22 0900 05/18/22 1000  BP:  (!) 145/59 (!) 141/54 (!) 143/51  Pulse:  81 78 89  Resp:  '15 15 16  '$ Temp: 99.8 F (37.7 C)     TempSrc: Axillary     SpO2:  98% 97% 99%  Weight:      Height:       CBC:  Recent Labs  Lab 05/15/22 1656 05/15/22 1703 05/16/22 0304 05/16/22 0405  WBC 6.9  --   --  12.1*  NEUTROABS 5.5  --   --  8.8*  HGB 6.1*   < > 11.2* 10.7*  HCT 20.3*   < > 33.0* 32.5*  MCV 74.4*  --   --  68.7*  PLT 158  --   --  272   < > = values in this interval not displayed.    Basic Metabolic Panel:  Recent Labs  Lab 05/15/22 1824 05/16/22 0304 05/16/22 0405 05/18/22 0241  NA 137 138 136  --   K 4.3 3.7 3.5 4.1  CL 107  --  107  --   CO2 20*  --  20*  --   GLUCOSE 130*  --  214*  --   BUN 10  --  7*  --   CREATININE 0.62  --  0.54  --   CALCIUM 9.8  --  8.5*  --   MG 1.5*  --  1.7 2.1  PHOS  --   --  3.6 2.5    Lipid Panel:  Recent Labs  Lab 05/16/22 0405 05/17/22 0725  CHOL 131  --   TRIG 349* 79  HDL 36*  --   CHOLHDL 3.6  --   VLDL 70*  --   LDLCALC 25  --     HgbA1c:  Recent Labs  Lab 05/16/22 0405  HGBA1C 7.0*   Urine Drug Screen:  Recent Labs  Lab 05/16/22 0405  LABOPIA NONE DETECTED  COCAINSCRNUR NONE DETECTED  LABBENZ NONE DETECTED  AMPHETMU NONE DETECTED  THCU NONE DETECTED  LABBARB NONE DETECTED     Alcohol Level  Recent Labs  Lab 05/15/22 1824  ETH <10     IMAGING past 24 hours No results found.  PHYSICAL EXAM  Physical Exam   Constitutional: Appears well-developed and well-nourished middle-age African-American lady who is not in distress Cardiovascular: Normal rate and regular rhythm.  Respiratory: Effort normal, non-labored breathing  Neuro: Mental Status: Eyes open.  Globally aphasic with left gaze deviation, does not track but will occasionally follow simple commands to pantomime Cranial Nerves: II: PERRL, 64m reactive III,IV, VI: EOMI without ptosis or diploplia. Left gaze deviation, does not cross midline. Does  consistently track XI: Head turned to the left Motor: Tone is normal. Bulk is normal. Spontaneous movement noted on the left.  No movement on the right but flaccid hemiplegia, will move RLE to noxious stimuli Sensory: Withdraws to pain on the left, minimally on the right    ASSESSMENT/PLAN Ms. SLequisha Cammackis a 66y.o. female with history of  HTN, hyperlipedmia, DM2, tobacco abuse presenting with waxing and waning stroke symptoms.  She last talked to family several days before admission.  Symptom discovery was at 2 PM on 8/26. Initial head CT was read with no acute intracranial process. Her initial examination for ED provider was notable for fluctuating right-sided weakness.  She was making repetitive statements ("I can't talk") and not following commands. She was taken to IR emergently for a mechanical thrombectomy and stent assisted angioplasty of the left ICA. Remained intubated post procedure. MRI and MRA show reocclusion of stent. Extubated 8/27.   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 Small vessel disease. Atrophy. ASPECTS 10.    CTA head & neck - Left posterior MCA territory infarct with core infarct volume of 62 mL. The posterior left M2 segment is occluded with poor  collaterals. 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 High-grade stenosis or occlusion of the left P2 segment.  CT perfusion Core 33m, Perfusion 12739m Mismatch 61 MRI-  acute nonhemorrhagic infarct involving the posterior left MCA territory and posterior left insula. Additional punctate foci of acute nonhemorrhagic infarct are present along the watershed distribution. [id number] nonhemorrhagic infarct in the medial left thalamus MRA  No flow in the left ICA. Mod stenosis of the left P2  2D Echo EF 65-70% LDL 25 HgbA1c 7.2 VTE prophylaxis - SCDs No antithrombotic prior to admission, now on aspirin 81 mg daily and Brilinta (ticagrelor) 90 mg bid.  Therapy recommendations: Inpatient  rehab  disposition:  pending  Occluded Left ICA anad MCA at M1 s/p mechanical thrombectomy with TICI2c revascularization  Stent assisted angioplasty of preocclusive left internal carotid artery at the bulb secondary to thick calcified atherosclerotic plaque Re occluded on MRA Continue brilinta  Hypertension Home meds:  lisinopril Stable BP <160 Cleviprex is off  Hyperlipidemia Home meds:  Atorvastatin '40mg'$  LDL 25, goal < 70 Continue statin at discharge  Diabetes type II Uncontrolled Home meds:  Metformin  HgbA1c 7.2, goal < 7.0 CBGs SSI  Other Stroke Risk Factors Advanced Age >/= 65 51igarette smoker, advised to stop smoking Obesity, Body mass index is 31.04 kg/m., BMI >/= 30 associated with increased stroke risk, recommend weight loss, diet and exercise as appropriate   Other Active Problems Acute Respiratory Failure Extubated 8/26 CCM on board as needed for respiratory management  Dysphagia SLP consult placed Cortrak today  Hospital day # 3  Patient seen and examined by NP/APP with MD. MD to update note as needed.   [place]N,  AGACNP-BC [facility] See Amion for schedule and pager information 05/18/2022 11:53 AM   I have personally obtained history,examined this patient, reviewed notes, independently viewed imaging studies, participated in medical decision making and plan of care.ROS completed by me personally and pertinent positives fully documented  I have made any additions or clarifications directly to the above note. Agree with note above.  Remains with significant global aphasia, left gaze deviation and dense right hemiplegia.  Recommend continue ongoing physical occupational and speech therapy.  Mobilize out of bed.  Transfer out of ICU when bed available to neurology floor bed.  Continue aspirin and Brilinta for her  carotid stent.  She will need prolonged cardiac monitoring at discharge to look for paroxysmal A-fib.  No family available at the bedside today for discussion.This patient is critically ill and at significant risk of neurological worsening, death and care requires constant monitoring of vital signs, hemodynamics,respiratory and cardiac monitoring, extensive review of multiple databases, frequent neurological assessment, discussion with family, other specialists and medical decision making of high complexity.I have made any additions or clarifications directly to the above note.This critical care time does not reflect procedure time, or teaching time or supervisory time of PA/NP/Med Resident etc but could involve care discussion time.  I spent 30 minutes of neurocritical care time  in the care of  this patient.      Antony Contras, MD Medical Director [facility] Pager: [phone] 05/18/2022 2:44 PM    To contact Stroke Continuity provider, please refer to [email or web address] After hours, contact General Neurology

## 2022-05-18 NOTE — Progress Notes (Signed)
Video connection link was texted to participant via mobile number provided. Sent link twice, but no connection was made.  Notified nurse.

## 2022-05-18 NOTE — Progress Notes (Signed)
Physical Therapy Treatment Patient Details Name: Bailey [name] MRN: [id number] DOB: 09/24/1955 Today's Date: 05/18/2022   History of Present Illness Pt is a 66 y.o. female who presented to the ED via EMS for AMS and R side weakness.  CTA revealed L MCA infarct. Pt underwent endovascular revascularization of occluded left internal carotid artery artery and left middle cerebral artery M1 8/26. Intubated 8/25 and self extubated 8/26. PMH:  HTN, hyperlipidemia, diabetes, tobacco abuse.    PT Comments    Pt required +1 total assist bed mobility, mod to min assist to maintain sitting balance EOB, and +2 max assist sit to stand x 3 trials. Pt with heavy push R with standing trials, requiring immediate return to sit. Good participation noted from pt. No active movement noted RUE/LE. L gaze and R inattention. Unable to get pt to cross midline visually. Due to her heavy R push, unable to safely transfer into recliner. Pt returned to supine in bed at end of session.    Recommendations for follow up therapy are one component of a multi-disciplinary discharge planning process, led by the attending physician.  Recommendations may be updated based on patient status, additional functional criteria and insurance authorization.  Follow Up Recommendations  Acute inpatient rehab (3hours/day)     Assistance Recommended at Discharge Frequent or constant Supervision/Assistance  Patient can return home with the following Two people to help with walking and/or transfers;Two people to help with bathing/dressing/bathroom;Assistance with cooking/housework;Assist for transportation;Help with stairs or ramp for entrance;Direct supervision/assist for medications management   Equipment Recommendations  Other (comment) (TBD)    Recommendations for Other Services       Precautions / Restrictions Precautions Precautions: Fall;Other (comment) Precaution Comments: R hemiparesis, R inattention, cortrak      Mobility  Bed Mobility Overal bed mobility: Needs Assistance Bed Mobility: Supine to Sit, Sit to Supine     Supine to sit: Total assist, +2 for safety/equipment, HOB elevated Sit to supine: Total assist, +2 for safety/equipment   General bed mobility comments: active participation from pt but < 25%    Transfers Overall transfer level: Needs assistance   Transfers: Sit to/from Stand Sit to Stand: +2 physical assistance, Max assist           General transfer comment: good initiation to power up but heavy push R, only able to sustain stance a few seconds x 3 trials due to heavy R lean    Ambulation/Gait               General Gait Details: unable   Stairs             Wheelchair Mobility    Modified Rankin (Stroke Patients Only) Modified Rankin (Stroke Patients Only) Pre-Morbid Rankin Score: No symptoms Modified Rankin: Severe disability     Balance Overall balance assessment: Needs assistance Sitting-balance support: Feet supported, Single extremity supported Sitting balance-Leahy Scale: Poor Sitting balance - Comments: mod progressing to min asssit to maintain sitting balance EOB, R lean Postural control: Right lateral lean Standing balance support: Bilateral upper extremity supported Standing balance-Leahy Scale: Zero                              Cognition Arousal/Alertness: Awake/alert Behavior During Therapy: Flat affect Overall Cognitive Status: Impaired/Different from baseline  General Comments: global aphasia. Following simple commands < 25% of trials with increased time. Unable to get pt to look R today.        Exercises      General Comments General comments (skin integrity, edema, etc.): VSS on RA      Pertinent Vitals/Pain Pain Assessment Pain Assessment: Faces Faces Pain Scale: No hurt    Home Living                          Prior Function             PT Goals (current goals can now be found in the care plan section) Acute Rehab PT Goals Patient Stated Goal: unable to state Progress towards PT goals: Progressing toward goals    Frequency    Min 4X/week      PT Plan Current plan remains appropriate    Co-evaluation              AM-PAC PT "6 Clicks" Mobility   Outcome Measure  Help needed turning from your back to your side while in a flat bed without using bedrails?: Total Help needed moving from lying on your back to sitting on the side of a flat bed without using bedrails?: Total Help needed moving to and from a bed to a chair (including a wheelchair)?: Total Help needed standing up from a chair using your arms (e.g., wheelchair or bedside chair)?: Total Help needed to walk in hospital room?: Total Help needed climbing 3-5 steps with a railing? : Total 6 Click Score: 6    End of Session Equipment Utilized During Treatment: Gait belt Activity Tolerance: Patient tolerated treatment well Patient left: in bed;with call bell/phone within reach Nurse Communication: Mobility status PT Visit Diagnosis: Other abnormalities of gait and mobility (R26.89);Hemiplegia and hemiparesis Hemiplegia - Right/Left: Right Hemiplegia - caused by: Cerebral infarction     Time: 0725-0752 PT Time Calculation (min) (ACUTE ONLY): 27 min  Charges:  $Therapeutic Activity: 23-37 mins                     Lorrin Goodell, PT  Office # [phone] Pager [phone]    Lorriane Shire 05/18/2022, 8:49 AM

## 2022-05-18 NOTE — Progress Notes (Signed)
Family member called with password to set up a video call in patients room with an aunt who lives out of town. Elink contacted and phone number provided for video call.

## 2022-05-18 NOTE — Progress Notes (Signed)
? ?  Inpatient Rehab Admissions Coordinator : ? ?Per therapy recommendations, patient was screened for CIR candidacy by Ozzie Knobel RN MSN.  At this time patient appears to be a potential candidate for CIR. I will place a rehab consult per protocol for full assessment. Please call me with any questions. ? ?Ladelle Teodoro RN MSN ?Admissions Coordinator ?[phone] ?  ?

## 2022-05-19 ENCOUNTER — Inpatient Hospital Stay (HOSPITAL_COMMUNITY): Payer: Medicare HMO

## 2022-05-19 DIAGNOSIS — I63232 Cerebral infarction due to unspecified occlusion or stenosis of left carotid arteries: Secondary | ICD-10-CM | POA: Diagnosis not present

## 2022-05-19 LAB — CBC
HCT: 35.4 % — ABNORMAL LOW (ref 36.0–46.0)
Hemoglobin: 11.2 g/dL — ABNORMAL LOW (ref 12.0–15.0)
MCH: 21.7 pg — ABNORMAL LOW (ref 26.0–34.0)
MCHC: 31.6 g/dL (ref 30.0–36.0)
MCV: 68.7 fL — ABNORMAL LOW (ref 80.0–100.0)
Platelets: 179 10*3/uL (ref 150–400)
RBC: 5.15 MIL/uL — ABNORMAL HIGH (ref 3.87–5.11)
RDW: 18.3 % — ABNORMAL HIGH (ref 11.5–15.5)
WBC: 13.8 10*3/uL — ABNORMAL HIGH (ref 4.0–10.5)
nRBC: 0 % (ref 0.0–0.2)

## 2022-05-19 LAB — GLUCOSE, CAPILLARY
Glucose-Capillary: 198 mg/dL — ABNORMAL HIGH (ref 70–99)
Glucose-Capillary: 229 mg/dL — ABNORMAL HIGH (ref 70–99)
Glucose-Capillary: 260 mg/dL — ABNORMAL HIGH (ref 70–99)
Glucose-Capillary: 268 mg/dL — ABNORMAL HIGH (ref 70–99)
Glucose-Capillary: 269 mg/dL — ABNORMAL HIGH (ref 70–99)
Glucose-Capillary: 286 mg/dL — ABNORMAL HIGH (ref 70–99)
Glucose-Capillary: 300 mg/dL — ABNORMAL HIGH (ref 70–99)

## 2022-05-19 MED ORDER — INSULIN ASPART 100 UNIT/ML IJ SOLN: 0.0000 [IU] | Freq: Three times a day (TID) | INTRAMUSCULAR | Status: DC

## 2022-05-19 MED ORDER — INSULIN ASPART 100 UNIT/ML IJ SOLN: 0.0000 [IU] | INTRAMUSCULAR | Status: DC

## 2022-05-19 MED ORDER — INSULIN ASPART 100 UNIT/ML IJ SOLN
0.0000 [IU] | INTRAMUSCULAR | Status: DC
  Administered 2022-05-19: 3 [IU] via SUBCUTANEOUS
  Administered 2022-05-19 – 2022-05-20 (×6): 8 [IU] via SUBCUTANEOUS
  Administered 2022-05-20: 11 [IU] via SUBCUTANEOUS
  Administered 2022-05-20 (×2): 8 [IU] via SUBCUTANEOUS
  Administered 2022-05-21: 5 [IU] via SUBCUTANEOUS
  Administered 2022-05-21: 11 [IU] via SUBCUTANEOUS
  Administered 2022-05-21 (×2): 8 [IU] via SUBCUTANEOUS
  Administered 2022-05-21: 11 [IU] via SUBCUTANEOUS
  Administered 2022-05-22 (×2): 8 [IU] via SUBCUTANEOUS
  Administered 2022-05-22: 5 [IU] via SUBCUTANEOUS
  Administered 2022-05-22: 11 [IU] via SUBCUTANEOUS
  Administered 2022-05-22: 5 [IU] via SUBCUTANEOUS
  Administered 2022-05-23: 3 [IU] via SUBCUTANEOUS
  Administered 2022-05-23: 8 [IU] via SUBCUTANEOUS
  Administered 2022-05-23: 5 [IU] via SUBCUTANEOUS
  Administered 2022-05-23: 11 [IU] via SUBCUTANEOUS
  Administered 2022-05-23: 8 [IU] via SUBCUTANEOUS
  Administered 2022-05-23: 5 [IU] via SUBCUTANEOUS
  Administered 2022-05-24: 11 [IU] via SUBCUTANEOUS
  Administered 2022-05-24: 5 [IU] via SUBCUTANEOUS
  Administered 2022-05-24: 8 [IU] via SUBCUTANEOUS
  Administered 2022-05-24: 11 [IU] via SUBCUTANEOUS
  Administered 2022-05-24 – 2022-05-25 (×5): 5 [IU] via SUBCUTANEOUS
  Administered 2022-05-25: 8 [IU] via SUBCUTANEOUS

## 2022-05-19 MED ORDER — INSULIN ASPART 100 UNIT/ML IJ SOLN
3.0000 [IU] | INTRAMUSCULAR | Status: DC
  Administered 2022-05-19 – 2022-05-20 (×4): 3 [IU] via SUBCUTANEOUS

## 2022-05-19 NOTE — Progress Notes (Signed)
  Transition of Care [facility]) Screening Note   Patient Details  Name: Bailey [name] Date of Birth: April 15, 1956   Transition of Care [facility]) CM/SW Contact:    Benard Halsted, LCSW Phone Number: 05/19/2022, 10:13 AM    Transition of Care Department [facility]) has reviewed patient with current therapy recommendations for CIR. We will continue to monitor patient advancement through interdisciplinary progression rounds. If new patient transition needs arise, please place a TOC consult.

## 2022-05-19 NOTE — Procedures (Signed)
Cortrak  Person Inserting Tube:  Debbora Ang D, RD Tube Type:  Cortrak - 43 inches Tube Size:  10 Tube Location:  Left nare Secured by: Bridle Technique Used to Measure Tube Placement:  Marking at nare/corner of mouth Cortrak Secured At:  69 cm  Cortrak Tube Team Note:  Consult received to place a Cortrak feeding tube.   X-ray is required, abdominal x-ray has been ordered by the Cortrak team. Please confirm tube placement before using the Cortrak tube.   If the tube becomes dislodged please keep the tube and contact the Cortrak team at [email or web address] (password [email or web address]) for replacement.  If after hours and replacement cannot be delayed, place a NG tube and confirm placement with an abdominal x-ray.    Bailey [name], RD, LDN Clinical Dietitian RD pager # available in AMION  After hours/weekend pager # available in AMION  

## 2022-05-19 NOTE — Progress Notes (Signed)
Physical Therapy Treatment Patient Details Name: Bailey [name] MRN: [id number] DOB: 03-05-1956 Today's Date: 05/19/2022   History of Present Illness The pt is a 66 yo female presenting 8/25 with R lateral lean, perseverative speech, and unable to follow commands. CTA revealed L MCA infarct. Pt underwent endovascular revascularization of occluded left internal carotid artery artery and left middle cerebral artery M1 8/26. Intubated 8/25 and self extubated 8/26. PMH includes: HTN, HLD, DM II, and tobacco use.    PT Comments    The pt was agreeable to session and sister was present to provide information regarding living situation and PLOF. The pt continues to need max multimodal cues including tactile facilitation to initiate movements to commands. The pt did attempt to assist with rolling to R side, and was able to progress to moments of minG with static sitting EOB. The pt also attempted sit-stand transfers with maxA of 2 and assist to reposition R foot and block R knee. Pt maintaining L gaze through session but was able to visually track towards midline on a few attempts, does allow for passive cervical ROM without resistance but no attempt to turn past midline this session. Will continue to benefit from skilled PT acutely to maximize functional recovery and safety with mobility.    Recommendations for follow up therapy are one component of a multi-disciplinary discharge planning process, led by the attending physician.  Recommendations may be updated based on patient status, additional functional criteria and insurance authorization.  Follow Up Recommendations  Acute inpatient rehab (3hours/day)     Assistance Recommended at Discharge Frequent or constant Supervision/Assistance  Patient can return home with the following Two people to help with walking and/or transfers;Two people to help with bathing/dressing/bathroom;Assistance with cooking/housework;Assist for transportation;Help with  stairs or ramp for entrance;Direct supervision/assist for medications management   Equipment Recommendations  Other (comment) (defer to post acute)    Recommendations for Other Services       Precautions / Restrictions Precautions Precautions: Fall;Other (comment) Precaution Comments: R hemiparesis, R inattention, cortrak Restrictions Weight Bearing Restrictions: No     Mobility  Bed Mobility Overal bed mobility: Needs Assistance Bed Mobility: Supine to Sit, Sit to Supine, Rolling Rolling: Max assist   Supine to sit: Total assist, +2 for physical assistance, +2 for safety/equipment, HOB elevated Sit to supine: Total assist, +2 for physical assistance, +2 for safety/equipment   General bed mobility comments: pt with some attempt to assist with rolling to the R, reaching with L arm and pushing with L leg once cued for positioning. The pt did need totalA due to lack of command following for LE movement and trunk movement to come to sitting EOB    Transfers Overall transfer level: Needs assistance Equipment used: 2 person hand held assist Transfers: Sit to/from Stand Sit to Stand: Max assist, +2 physical assistance, From elevated surface           General transfer comment: pt completed x2 with use of bed pad for hip extenstion and blocking of R knee. needed physical assist to reposition R foot and cues to wt shift laterally to maintain wt on both legs    Ambulation/Gait               General Gait Details: unable    Modified Rankin (Stroke Patients Only) Modified Rankin (Stroke Patients Only) Pre-Morbid Rankin Score: No symptoms Modified Rankin: Severe disability     Balance Overall balance assessment: Needs assistance Sitting-balance support: Feet supported, Single extremity supported Sitting  balance-Leahy Scale: Poor Sitting balance - Comments: initially modA, progressing to moments of minG Postural control: Right lateral lean Standing balance support:  Bilateral upper extremity supported Standing balance-Leahy Scale: Zero Standing balance comment: dependent on therapist support and blocking R knee                            Cognition Arousal/Alertness: Awake/alert Behavior During Therapy: Flat affect Overall Cognitive Status: Impaired/Different from baseline                                 General Comments: pt with aphasia. Able to visually track when not distracted and with max cues. following <25% of commands despite max cues and increased time.        Exercises      General Comments General comments (skin integrity, edema, etc.): VSS on RA        Home Living Family/patient expects to be discharged to:: Private residence Living Arrangements: Alone Available Help at Discharge: Family;Available 24 hours/day (but unable to provide physical assist, PTA the pt was helping the sister) Type of Home: Apartment Home Access: Stairs to enter Entrance Stairs-Rails: Right Entrance Stairs-Number of Steps: 3   Home Layout: One level Home Equipment: None Additional Comments: info provided by sister    Prior Function            PT Goals (current goals can now be found in the care plan section) Acute Rehab PT Goals Patient Stated Goal: unable to state PT Goal Formulation: Patient unable to participate in goal setting Time For Goal Achievement: 05/31/22 Potential to Achieve Goals: Good Progress towards PT goals: Progressing toward goals    Frequency    Min 4X/week      PT Plan Current plan remains appropriate    Co-evaluation PT/OT/SLP Co-Evaluation/Treatment: Yes Reason for Co-Treatment: Complexity of the patient's impairments (multi-system involvement);Necessary to address cognition/behavior during functional activity;For patient/therapist safety;To address functional/ADL transfers PT goals addressed during session: Mobility/safety with mobility;Balance;Strengthening/ROM        AM-PAC PT  "6 Clicks" Mobility   Outcome Measure  Help needed turning from your back to your side while in a flat bed without using bedrails?: Total Help needed moving from lying on your back to sitting on the side of a flat bed without using bedrails?: Total Help needed moving to and from a bed to a chair (including a wheelchair)?: Total Help needed standing up from a chair using your arms (e.g., wheelchair or bedside chair)?: Total Help needed to walk in hospital room?: Total Help needed climbing 3-5 steps with a railing? : Total 6 Click Score: 6    End of Session Equipment Utilized During Treatment: Gait belt Activity Tolerance: Patient tolerated treatment well Patient left: in bed;with call bell/phone within reach;with family/visitor present Nurse Communication: Mobility status PT Visit Diagnosis: Other abnormalities of gait and mobility (R26.89);Hemiplegia and hemiparesis Hemiplegia - Right/Left: Right Hemiplegia - dominant/non-dominant: Dominant Hemiplegia - caused by: Cerebral infarction     Time: 8882-8003 PT Time Calculation (min) (ACUTE ONLY): 37 min  Charges:  $Therapeutic Activity: 8-22 mins                     West Carbo, PT, DPT   Acute Rehabilitation Department   Sandra Cockayne 05/19/2022, 4:20 PM

## 2022-05-19 NOTE — Progress Notes (Signed)
Occupational Therapy Treatment Patient Details Name: Bailey [name] MRN: [id number] DOB: 1956/07/31 Today's Date: 05/19/2022   History of present illness The pt is a 66 yo female presenting 8/25 with R lateral lean, perseverative speech, and unable to follow commands. CTA revealed L MCA infarct. Pt underwent endovascular revascularization of occluded left internal carotid artery artery and left middle cerebral artery M1 8/26. Intubated 8/25 and self extubated 8/26. PMH includes: HTN, HLD, DM II, and tobacco use.   OT comments  [name] was seen with PT for safety and is making incremental progress towards her goals. She was total A +2 to get to EOB and maintained sitting balance with min A - min G and noted to correct posterior and R LOBs. Pt appropriately said "yes/no" no to 2x simple questions and tracked to across midline with increased cues, here visual attention remains poor. Pt stood EOB with max A +2, no buckling noted. OT to continue to follow, POC remains appropriate.   Sister present and provided home set up and PLOF:  Sister, [name] lives with the pt and the pt often had to assist [name] with ADLIADLs. Apartment with 3 STE, tube shower, standard toileting, no DME. Pt worked at group home, drove and completed all ADL/IADLs independently.    Recommendations for follow up therapy are one component of a multi-disciplinary discharge planning process, led by the attending physician.  Recommendations may be updated based on patient status, additional functional criteria and insurance authorization.    Follow Up Recommendations  Acute inpatient rehab (3hours/day)    Assistance Recommended at Discharge Frequent or constant Supervision/Assistance  Patient can return home with the following  A lot of help with walking and/or transfers;A lot of help with bathing/dressing/bathroom;Assistance with cooking/housework;Direct supervision/assist for medications management;Assistance with feeding;Direct  supervision/assist for financial management;Assist for transportation;Help with stairs or ramp for entrance   Equipment Recommendations  Other (comment)    Recommendations for Other Services Rehab consult    Precautions / Restrictions Precautions Precautions: Fall;Other (comment) Precaution Comments: R hemiparesis, R inattention, cortrak Restrictions Weight Bearing Restrictions: No       Mobility Bed Mobility Overal bed mobility: Needs Assistance Bed Mobility: Supine to Sit, Sit to Supine, Rolling Rolling: Max assist   Supine to sit: Total assist, +2 for physical assistance, +2 for safety/equipment, HOB elevated Sit to supine: Total assist, +2 for physical assistance, +2 for safety/equipment   General bed mobility comments: pt with some attempt to assist with rolling to the R, reaching with L arm and pushing with L leg once cued for positioning. The pt did need totalA due to lack of command following for LE movement and trunk movement to come to sitting EOB    Transfers Overall transfer level: Needs assistance Equipment used: 2 person hand held assist Transfers: Sit to/from Stand Sit to Stand: Max assist, +2 physical assistance, From elevated surface           General transfer comment: pt completed x2 with use of bed pad for hip extenstion and blocking of R knee. needed physical assist to reposition R foot and cues to wt shift laterally to maintain wt on both legs     Balance Overall balance assessment: Needs assistance Sitting-balance support: Feet supported, Single extremity supported Sitting balance-Leahy Scale: Poor Sitting balance - Comments: initially modA, progressing to moments of minG Postural control: Right lateral lean Standing balance support: Bilateral upper extremity supported Standing balance-Leahy Scale: Zero Standing balance comment: dependent on therapist support and blocking R knee  ADL either performed or  assessed with clinical judgement   ADL Overall ADL's : Needs assistance/impaired                                       General ADL Comments: total A for all aspects of care    Extremity/Trunk Assessment Upper Extremity Assessment Upper Extremity Assessment: RUE deficits/detail RUE Deficits / Details: no active movement noted. Able to move through full PROM RUE Sensation: decreased light touch;decreased proprioception RUE Coordination: decreased fine motor;decreased gross motor LUE Deficits / Details: moving against gravity throguhout full range - difficult to fully assess due to impiared communication/cognition   Lower Extremity Assessment Lower Extremity Assessment: Defer to PT evaluation        Vision   Vision Assessment?: Vision impaired- to be further tested in functional context Additional Comments: R gaze, came to midline with increased effort. poor visual attention   Perception Perception Perception: Not tested   Praxis Praxis Praxis: Not tested    Cognition Arousal/Alertness: Awake/alert Behavior During Therapy: Flat affect Overall Cognitive Status: Impaired/Different from baseline                                 General Comments: pt with aphasia. Able to visually track when not distracted and with max cues. following <25% of commands despite max cues and increased time. pt laughed appropriately to therapist comment, and then continued to chucle throughout session, perseverating              General Comments VSS on RA, sister present    Pertinent Vitals/ Pain       Pain Assessment Pain Assessment: Faces Pain Score: 0-No pain Pain Location: no indicatio of pain throughout session Pain Intervention(s): Monitored during session  [place] expects to be discharged to:: Private residence Living Arrangements: Alone Available Help at Discharge: Family;Available 24 hours/day (but unable to provide physical assist,  PTA the pt was helping the sister) Type of Home: Apartment Home Access: Stairs to enter Entrance Stairs-Number of Steps: 3 Entrance Stairs-Rails: Right Home Layout: One level     Bathroom Shower/Tub: Teacher, early years/pre: Standard     Home Equipment: None   Additional Comments: info provided by sister      Prior Functioning/Environment              Frequency  Min 2X/week        Progress Toward Goals  OT Goals(current goals can now be found in the care plan section)  Progress towards OT goals: Progressing toward goals  Acute Rehab OT Goals Patient Stated Goal: unable to state OT Goal Formulation: Patient unable to participate in goal setting Time For Goal Achievement: 05/31/22 Potential to Achieve Goals: Good  Plan      Co-evaluation    PT/OT/SLP Co-Evaluation/Treatment: Yes Reason for Co-Treatment: Complexity of the patient's impairments (multi-system involvement);For patient/therapist safety;To address functional/ADL transfers PT goals addressed during session: Mobility/safety with mobility;Balance;Strengthening/ROM OT goals addressed during session: ADL's and self-care      AM-PAC OT "6 Clicks" Daily Activity     Outcome Measure   Help from another person eating meals?: Total Help from another person taking care of personal grooming?: Total Help from another person toileting, which includes using toliet, bedpan, or urinal?: Total Help from another person bathing (including washing, rinsing, drying)?:  Total Help from another person to put on and taking off regular upper body clothing?: Total Help from another person to put on and taking off regular lower body clothing?: Total 6 Click Score: 6    End of Session Equipment Utilized During Treatment: Oxygen  OT Visit Diagnosis: Unsteadiness on feet (R26.81);Other abnormalities of gait and mobility (R26.89);Muscle weakness (generalized) (M62.81);Cognitive communication deficit  (R41.841);Hemiplegia and hemiparesis Symptoms and signs involving cognitive functions: Cerebral infarction Hemiplegia - Right/Left: Right Hemiplegia - caused by: Cerebral infarction   Activity Tolerance Patient tolerated treatment well   Patient Left in bed;with call bell/phone within reach;with bed alarm set   Nurse Communication Mobility status        Time: 1434-1510 OT Time Calculation (min): 36 min  Charges: OT General Charges $OT Visit: 1 Visit OT Treatments $Self Care/Home Management : 8-22 mins   Sheretha Shadd A Caellum Mancil 05/19/2022, 5:11 PM

## 2022-05-19 NOTE — Progress Notes (Addendum)
STROKE TEAM PROGRESS NOTE   INTERVAL HISTORY No family at the bedside. Hemodynamically and neurologically .  Awaiting cortrak placement and bed of the floor. She remains aphasic and cannot follow only occasional midline and one-step commands intermittently.  Continues to have left gaze deviation and dense right hemiplegia.  Vital signs stable. .  Her sister is at the bedside. Vitals:   05/19/22 0830 05/19/22 0939 05/19/22 1149 05/19/22 1200  BP: (!) 145/55   (!) 141/56  Pulse: 80   66  Resp: 14   18  Temp:  99.8 F (37.7 C) 99.1 F (37.3 C)   TempSrc:  Axillary Axillary   SpO2: 99%   100%  Weight:      Height:       CBC:  Recent Labs  Lab 05/15/22 1656 05/15/22 1703 05/16/22 0405 05/19/22 0203  WBC 6.9  --  12.1* 13.8*  NEUTROABS 5.5  --  8.8*  --   HGB 6.1*   < > 10.7* 11.2*  HCT 20.3*   < > 32.5* 35.4*  MCV 74.4*  --  68.7* 68.7*  PLT 158  --  272 179   < > = values in this interval not displayed.   Basic Metabolic Panel:  Recent Labs  Lab 05/16/22 0405 05/18/22 0241 05/18/22 1210  NA 136  --  137  K 3.5 4.1 4.2  CL 107  --  111  CO2 20*  --  18*  GLUCOSE 214*  --  226*  BUN 7*  --  11  CREATININE 0.54  --  0.53  CALCIUM 8.5*  --  8.2*  MG 1.7 2.1  --   PHOS 3.6 2.5  --    Lipid Panel:  Recent Labs  Lab 05/16/22 0405 05/17/22 0725  CHOL 131  --   TRIG 349* 79  HDL 36*  --   CHOLHDL 3.6  --   VLDL 70*  --   LDLCALC 25  --    HgbA1c:  Recent Labs  Lab 05/16/22 0405  HGBA1C 7.0*   Urine Drug Screen:  Recent Labs  Lab 05/16/22 0405  LABOPIA NONE DETECTED  COCAINSCRNUR NONE DETECTED  LABBENZ NONE DETECTED  AMPHETMU NONE DETECTED  THCU NONE DETECTED  LABBARB NONE DETECTED    Alcohol Level  Recent Labs  Lab 05/15/22 1824  ETH <10    IMAGING past 24 hours No results found.  PHYSICAL EXAM  Physical Exam  Constitutional: Obese middle-age African-American lady who is not in distress Cardiovascular: Normal rate and regular rhythm.   Respiratory: Effort normal, non-labored breathing  Neuro: Mental Status: Eyes open.  Globally aphasic with left gaze deviation, does not track but will occasionally follow simple midline commands to pantomime Cranial Nerves: II: PERRL, 4m reactive III,IV, VI: EOMI without ptosis or diploplia. Left gaze deviation, does not cross midline. Does  consistently track XI: Head turned to the left Motor: Tone is normal. Bulk is normal. Spontaneous movement noted on the left.  Trace movement on the right with flaccid hemiplegia, will move RLE to noxious stimuli Sensory: Withdraws to pain on the left, minimally on the right    ASSESSMENT/PLAN Ms. Bailey [name] a 66y.o. female with history of HTN, hyperlipedmia, DM2, tobacco abuse presenting with waxing and waning stroke symptoms.  She last talked to family several days before admission.  Symptom discovery was at 2 PM on 8/26. Initial head CT was read with no acute intracranial process. Her initial examination for ED provider  was notable for fluctuating right-sided weakness.  She was making repetitive statements ("I can't talk") and not following commands. She was taken to IR emergently for a mechanical thrombectomy and stent assisted angioplasty of the left ICA. Remained intubated post procedure. MRI and MRA show reocclusion of stent. Extubated 8/27.   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 Small vessel disease. Atrophy. ASPECTS 10.    CTA head & neck - Left posterior MCA territory infarct with core infarct volume of 62 mL. The posterior left M2 segment is occluded with poor collaterals. High-grade stenosis or occlusion of the left internal carotid artery at the bifurcation with reconstitution in the left cavernous segment,  likely from the ophthalmic artery. High-grade stenosis or occlusion of the left P2 segment.  CT perfusion Core 47m, Perfusion 1251m Mismatch 61 MRI-  acute nonhemorrhagic infarct involving the posterior left MCA territory and posterior left insula. Additional punctate foci of acute nonhemorrhagic infarct are present along the watershed distribution. 48m63mcute nonhemorrhagic infarct in the medial left thalamus MRA  No flow in the left ICA. Mod stenosis of the left P2  2D Echo EF 65-70% LDL 25 HgbA1c 7.2 VTE prophylaxis - SCDs No antithrombotic prior to admission, now on aspirin 81 mg daily and Brilinta (ticagrelor) 90 mg bid.  Therapy recommendations: Inpatient  rehab  disposition:  pending  Occluded Left ICA anad MCA at M1 s/p mechanical thrombectomy with TICI2c revascularization  Stent assisted angioplasty of preocclusive left internal carotid artery at the bulb secondary to thick calcified atherosclerotic plaque Re occluded on MRA Continue brilinta  Hypertension Home meds:  lisinopril Stable BP <160 Cleviprex is off  Hyperlipidemia Home meds:  Atorvastatin '40mg'$  LDL 25, goal < 70 Continue statin at discharge  Diabetes type II Uncontrolled Home meds:  Metformin  HgbA1c 7.2, goal < 7.0 CBGs SSI- increasing coverage per diabetic coordinator recommendations 0-15u and 3u q4 for TF coverage  Other Stroke Risk Factors Advanced Age >/= 65 52igarette smoker, advised to stop smoking Obesity, Body mass index is 30.85 kg/m., BMI >/= 30 associated with increased stroke risk, recommend weight loss, diet and exercise as appropriate   Other Active Problems Acute Respiratory Failure Extubated 8/26 CCM on board as needed for respiratory management  Dysphagia SLP consult placed Cortrak today  Hospital day # 4   Patient seen and examined by NP/APP with MD. MD to update note as needed.   DevJanine OresNP, FNP-BC [facility] Pager: [phone] have personally  obtained history,examined this patient, reviewed notes, independently viewed imaging studies, participated in medical decision making and plan of care.ROS completed by me personally and pertinent positives fully documented  I have made any additions or clarifications directly to the above note. Agree with note above.  Patient remains with global aphasia and dense hemiplegia.  Continue aspirin and Brilinta and mobilize out of bed.  Physical occupational and speech therapy consults.  Transfer to neurology floor bed when available.  She will likely need inpatient rehab.  Long discussion with patient's daughter at the bedside and answered questions.This patient is critically ill and at significant risk of neurological worsening, death and care requires constant monitoring of vital signs, hemodynamics,respiratory and cardiac monitoring, extensive review of multiple databases, frequent neurological assessment, discussion with family, other specialists and  medical decision making of high complexity.I have made any additions or clarifications directly to the above note.This critical care time does not reflect procedure time, or teaching time or supervisory time of PA/NP/Med Resident etc but could involve care discussion time.  I spent 30 minutes of neurocritical care time  in the care of  this patient.      Antony Contras, MD Medical Director [facility] St[facility] Pager: [phone] 05/19/2022 3:12 PM    To contact Stroke Continuity provider, please refer to [email or web address] After hours, contact General Neurology

## 2022-05-19 NOTE — Progress Notes (Signed)
Inpatient Rehab Coordinator Note:  I met with patient and her sister at bedside to discuss CIR recommendations and goals/expectations of CIR stay.  We reviewed 3 hrs/day of therapy, physician follow up, and average length of stay 2 weeks (dependent upon progress) with goals of moderate assist, likely w/c level.  Per sister, pt has a son, but feels that they would not be able to provide level of assist expected.  We reviewed [facility] auth requirements and that they would not approve SNF stay following CIR.  I will let TOC know to discuss SNF and will await final confirmation of plan before signing off.   Shann Medal, PT, DPT Admissions Coordinator [phone] 05/19/22  12:22 PM

## 2022-05-20 ENCOUNTER — Inpatient Hospital Stay (HOSPITAL_COMMUNITY): Payer: Medicare HMO

## 2022-05-20 DIAGNOSIS — I63232 Cerebral infarction due to unspecified occlusion or stenosis of left carotid arteries: Secondary | ICD-10-CM | POA: Diagnosis not present

## 2022-05-20 LAB — CBC
HCT: 34.8 % — ABNORMAL LOW (ref 36.0–46.0)
Hemoglobin: 11 g/dL — ABNORMAL LOW (ref 12.0–15.0)
MCH: 21.9 pg — ABNORMAL LOW (ref 26.0–34.0)
MCHC: 31.6 g/dL (ref 30.0–36.0)
MCV: 69.2 fL — ABNORMAL LOW (ref 80.0–100.0)
Platelets: 279 10*3/uL (ref 150–400)
RBC: 5.03 MIL/uL (ref 3.87–5.11)
RDW: 18.2 % — ABNORMAL HIGH (ref 11.5–15.5)
WBC: 13 10*3/uL — ABNORMAL HIGH (ref 4.0–10.5)
nRBC: 0 % (ref 0.0–0.2)

## 2022-05-20 LAB — GLUCOSE, CAPILLARY
Glucose-Capillary: 253 mg/dL — ABNORMAL HIGH (ref 70–99)
Glucose-Capillary: 258 mg/dL — ABNORMAL HIGH (ref 70–99)
Glucose-Capillary: 281 mg/dL — ABNORMAL HIGH (ref 70–99)
Glucose-Capillary: 282 mg/dL — ABNORMAL HIGH (ref 70–99)
Glucose-Capillary: 284 mg/dL — ABNORMAL HIGH (ref 70–99)
Glucose-Capillary: 308 mg/dL — ABNORMAL HIGH (ref 70–99)

## 2022-05-20 MED ORDER — LABETALOL HCL 5 MG/ML IV SOLN: 20.0000 mg | INTRAVENOUS | Status: DC | PRN

## 2022-05-20 MED ORDER — AMLODIPINE BESYLATE 5 MG PO TABS
5.0000 mg | ORAL_TABLET | Freq: Every day | ORAL | Status: DC
  Administered 2022-05-20 – 2022-05-27 (×8): 5 mg
  Filled 2022-05-20 (×7): qty 1

## 2022-05-20 MED ORDER — AMLODIPINE BESYLATE 5 MG PO TABS
5.0000 mg | ORAL_TABLET | Freq: Every day | ORAL | Status: DC
Start: 2022-05-20 — End: 2022-05-20
  Filled 2022-05-20: qty 1

## 2022-05-20 MED ORDER — INSULIN ASPART 100 UNIT/ML IJ SOLN
5.0000 [IU] | INTRAMUSCULAR | Status: DC
  Administered 2022-05-20 – 2022-05-21 (×6): 5 [IU] via SUBCUTANEOUS

## 2022-05-20 NOTE — Progress Notes (Signed)
Physical Therapy Treatment Patient Details Name: Bailey [name] MRN: [id number] DOB: 09-06-1956 Today's Date: 05/20/2022   History of Present Illness The pt is a 66 yo female presenting 8/25 with R lateral lean, perseverative speech, and unable to follow commands. CTA revealed L MCA infarct. Pt underwent endovascular revascularization of occluded left internal carotid artery artery and left middle cerebral artery M1 8/26. Intubated 8/25 and self extubated 8/26. PMH includes: HTN, HLD, DM II, and tobacco use.    PT Comments    Pt continues to demonstrate deficits in cognition and communication along with R-sided inattention. Pt benefits from max verbal and tactile cues along with guidance for placing her limbs/trunk to assist in mobility. Pt was able to roll with maxA and transition sit > supine with maxAx2 but still required TA x2 to transition sidelying > sit due to her deficits in command following and R-sided weakness. Multi-modal cues provided to facilitate tracking to the R and midline alignment with static sitting EOB. Neuromuscular re-education provided through cuing pt to activate R lower extremity muscles with assistance with gravity eliminated. She did not demonstrate R lower extremity muscular activation today. Will continue to follow acutely. Current recommendations remain appropriate.    Recommendations for follow up therapy are one component of a multi-disciplinary discharge planning process, led by the attending physician.  Recommendations may be updated based on patient status, additional functional criteria and insurance authorization.  Follow Up Recommendations  Acute inpatient rehab (3hours/day)     Assistance Recommended at Discharge Frequent or constant Supervision/Assistance  Patient can return home with the following Two people to help with walking and/or transfers;Two people to help with bathing/dressing/bathroom;Assistance with cooking/housework;Assist for  transportation;Help with stairs or ramp for entrance;Direct supervision/assist for medications management;Direct supervision/assist for financial management;Assistance with feeding   Equipment Recommendations  Other (comment) (defer to post acute)    Recommendations for Other Services       Precautions / Restrictions Precautions Precautions: Fall;Other (comment) Precaution Comments: R hemiparesis, R inattention, cortrak, L mitten, watch BP Restrictions Weight Bearing Restrictions: No     Mobility  Bed Mobility Overal bed mobility: Needs Assistance Bed Mobility: Sit to Supine, Rolling, Sidelying to Sit Rolling: Max assist Sidelying to sit: Total assist, +2 for physical assistance, +2 for safety/equipment, HOB elevated   Sit to supine: +2 for physical assistance, +2 for safety/equipment, Max assist   General bed mobility comments: Pt benefitting from guiding her L UE around therapist to her L to pull to roll to L. Assistance provided along with guidance to reach L UE to R and turn head to R to roll R. Poor command following to performing sidelying > sit, needing TAx2. But pt initiating trunk descent to supine, maxAx2 to lift legs and control trunk.    Transfers Overall transfer level: Needs assistance Equipment used: 2 person hand held assist Transfers: Sit to/from Stand Sit to Stand: Max assist, +2 physical assistance, From elevated surface, +2 safety/equipment           General transfer comment: pt completed x2 with use of bed pad for hip extenstion and blocking of R knee. needed physical assist to reposition R foot and cues to wt shift laterally to maintain wt on both legs    Ambulation/Gait               General Gait Details: unable   Stairs             Wheelchair Mobility    Modified Rankin (Stroke Patients  Only) Modified Rankin (Stroke Patients Only) Pre-Morbid Rankin Score: No symptoms Modified Rankin: Severe disability     Balance Overall  balance assessment: Needs assistance Sitting-balance support: Feet supported, Single extremity supported, No upper extremity supported Sitting balance-Leahy Scale: Poor Sitting balance - Comments: initially modA, progressing to moments of minA but progressing even to moments of min guard assist when pt would lean her forearms on her legs for more mildine balance. Pt has difficulty following commands to lean to L when she would begin to fall to her R, delayed and weak reactional response noted. Postural control: Right lateral lean, Posterior lean Standing balance support: Bilateral upper extremity supported Standing balance-Leahy Scale: Zero Standing balance comment: dependent on therapist support and blocking R knee                            Cognition Arousal/Alertness: Awake/alert Behavior During Therapy: Flat affect Overall Cognitive Status: Impaired/Different from baseline Area of Impairment: Attention, Memory, Following commands, Safety/judgement, Awareness, Problem solving                   Current Attention Level: Sustained Memory: Decreased short-term memory Following Commands: Follows one step commands inconsistently, Follows one step commands with increased time Safety/Judgement: Decreased awareness of safety, Decreased awareness of deficits Awareness: Intellectual Problem Solving: Slow processing, Decreased initiation, Difficulty sequencing, Requires verbal cues, Requires tactile cues General Comments: pt with aphasia. Able to visually track to a little past midline to R when not distracted and with max cues. following <25% of commands despite max cues and increased time. Benefits from tactile cues to guide L UE to assist in mobility. Attends to R side with her L hand at times, but otherwise inattentive        Exercises Other Exercises Other Exercises: neuromuscular initiation through cues to assist with R knee and hip flexion/extension in L sidelying, 5x,  but no initiation of muscles noted Other Exercises: cues provided for pt to turn and look to R to reduce R inattention; educated sister to sit towards pt's R to increase R side attention Other Exercises: tactile stimulation of R UE, cuing pt to hold R hand with her L    General Comments General comments (skin integrity, edema, etc.): BP down to 76/67 once returned to supine, 93/69 a couple min in supine, 141/61 after ~5 min in supine      Pertinent Vitals/Pain Pain Assessment Pain Assessment: Faces Faces Pain Scale: No hurt Pain Intervention(s): Monitored during session    Home Living                          Prior Function            PT Goals (current goals can now be found in the care plan section) Acute Rehab PT Goals Patient Stated Goal: unable to state PT Goal Formulation: With family Time For Goal Achievement: 05/31/22 Potential to Achieve Goals: Good Progress towards PT goals: Progressing toward goals    Frequency    Min 4X/week      PT Plan Current plan remains appropriate    Co-evaluation              AM-PAC PT "6 Clicks" Mobility   Outcome Measure  Help needed turning from your back to your side while in a flat bed without using bedrails?: A Lot Help needed moving from lying on your back to sitting on the  side of a flat bed without using bedrails?: Total Help needed moving to and from a bed to a chair (including a wheelchair)?: Total Help needed standing up from a chair using your arms (e.g., wheelchair or bedside chair)?: Total Help needed to walk in hospital room?: Total Help needed climbing 3-5 steps with a railing? : Total 6 Click Score: 7    End of Session Equipment Utilized During Treatment: Gait belt Activity Tolerance: Patient tolerated treatment well Patient left: in bed;with call bell/phone within reach;with family/visitor present;with bed alarm set;with restraints reapplied Nurse Communication: Mobility status;Other  (comment) (BP) PT Visit Diagnosis: Other abnormalities of gait and mobility (R26.89);Hemiplegia and hemiparesis;Unsteadiness on feet (R26.81);Muscle weakness (generalized) (M62.81);Difficulty in walking, not elsewhere classified (R26.2);Other symptoms and signs involving the nervous system (R29.898) Hemiplegia - Right/Left: Right Hemiplegia - dominant/non-dominant: Dominant Hemiplegia - caused by: Cerebral infarction     Time: 1402-1430 PT Time Calculation (min) (ACUTE ONLY): 28 min  Charges:  $Therapeutic Activity: 8-22 mins $Neuromuscular Re-education: 8-22 mins                     Moishe Spice, PT, DPT Acute Rehabilitation Services  Office: [place] 05/20/2022, 2:45 PM

## 2022-05-20 NOTE — Progress Notes (Addendum)
STROKE TEAM PROGRESS NOTE   INTERVAL HISTORY No family at the bedside. Hemodynamically and neurologically . Transfer orders are in. Cortrak is placed. May need PEG tube if she is going to be placed at a SNF.  She remains aphasic and cannot follow only occasional midline and one-step commands intermittently.  Continues to have left gaze deviation and dense right hemiplegia.   Vital signs are stable.  Neurological exam is unchanged Vitals:   05/20/22 0800 05/20/22 0810 05/20/22 0900 05/20/22 1000  BP: (!) 165/57 (!) 157/51 (!) 136/42 (!) 138/59  Pulse: 76 74 76 82  Resp: '16 20 16 14  '$ Temp: 98.9 F (37.2 C)     TempSrc: Axillary     SpO2: 98% 99% 97% 97%  Weight:      Height:       CBC:  Recent Labs  Lab 05/15/22 1656 05/15/22 1703 05/16/22 0405 05/19/22 0203 05/20/22 1105  WBC 6.9  --  12.1* 13.8* 13.0*  NEUTROABS 5.5  --  8.8*  --   --   HGB 6.1*   < > 10.7* 11.2* 11.0*  HCT 20.3*   < > 32.5* 35.4* 34.8*  MCV 74.4*  --  68.7* 68.7* 69.2*  PLT 158  --  272 179 279   < > = values in this interval not displayed.    Basic Metabolic Panel:  Recent Labs  Lab 05/16/22 0405 05/18/22 0241 05/18/22 1210  NA 136  --  137  K 3.5 4.1 4.2  CL 107  --  111  CO2 20*  --  18*  GLUCOSE 214*  --  226*  BUN 7*  --  11  CREATININE 0.54  --  0.53  CALCIUM 8.5*  --  8.2*  MG 1.7 2.1  --   PHOS 3.6 2.5  --     Lipid Panel:  Recent Labs  Lab 05/16/22 0405 05/17/22 0725  CHOL 131  --   TRIG 349* 79  HDL 36*  --   CHOLHDL 3.6  --   VLDL 70*  --   LDLCALC 25  --     HgbA1c:  Recent Labs  Lab 05/16/22 0405  HGBA1C 7.0*    Urine Drug Screen:  Recent Labs  Lab 05/16/22 0405  LABOPIA NONE DETECTED  COCAINSCRNUR NONE DETECTED  LABBENZ NONE DETECTED  AMPHETMU NONE DETECTED  THCU NONE DETECTED  LABBARB NONE DETECTED     Alcohol Level  Recent Labs  Lab 05/15/22 1824  ETH <10     IMAGING past 24 hours DG CHEST PORT 1 VIEW  Result Date: 05/20/2022 CLINICAL  DATA:  Respiratory abnormalities EXAM: PORTABLE CHEST 1 VIEW COMPARISON:  Chest x-ray dated May 15, 2022 FINDINGS: Interval feeding tube placement which is partially visualized coursing below the diaphragm. Cardiac and mediastinal contours are within normal limits for exam technique. Lungs are clear. Large pleural effusion or pneumothorax. IMPRESSION: Lungs are clear. Electronically Signed   By: Yetta Glassman M.D.   On: 05/20/2022 10:01   DG Abd Portable 1V  Result Date: 05/19/2022 CLINICAL DATA:  Feeding tube placement EXAM: PORTABLE ABDOMEN - 1 VIEW COMPARISON:  05/17/2022 FINDINGS: Interval removal of esophagogastric tube, with placement of a non weighted enteric feeding tube. Tip is positioned in the vicinity of the duodenal bulb. Nonobstructive pattern of included bowel gas. IMPRESSION: Interval removal of esophagogastric tube, with placement of a non weighted enteric feeding tube. Tip is positioned in the vicinity of the duodenal bulb. Consider advancement post pyloric  positioning at the duodenal jejunal junction is desired. Electronically Signed   By: Delanna Ahmadi M.D.   On: 05/19/2022 14:51    PHYSICAL EXAM  Physical Exam  Constitutional: Obese middle-age African-American lady who is not in distress Cardiovascular: Normal rate and regular rhythm.  Respiratory: Effort normal, non-labored breathing  Neuro: Mental Status: Eyes open.  Globally aphasic with left gaze deviation, does not track but will occasionally follow simple midline commands to pantomime Cranial Nerves: II: PERRL, 40m reactive III,IV, VI: EOMI without ptosis or diploplia. Left gaze deviation, does not cross midline. Does  consistently track XI: Head turned to the left Motor: Tone is normal. Bulk is normal. Spontaneous movement noted on the left.  Trace movement on the right with flaccid hemiplegia, will move RLE to noxious stimuli Sensory: Withdraws to pain on the left, minimally on the  right    ASSESSMENT/PLAN Ms. Bailey Shimadais a 66y.o. female with history of HTN, hyperlipedmia, DM2, tobacco abuse presenting with waxing and waning stroke symptoms.  She last talked to family several days before admission.  Symptom discovery was at 2 PM on 8/26. Initial head CT was read with no acute intracranial process. Her initial examination for ED provider was notable for fluctuating right-sided weakness.  She was making repetitive statements ("I can't talk") and not following commands. She was taken to IR emergently for a mechanical thrombectomy and stent assisted angioplasty of the left ICA. Remained intubated post procedure. MRI and MRA show reocclusion of stent. Extubated 8/27.   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 Small vessel disease. Atrophy. ASPECTS 10.    CTA head & neck - Left posterior MCA territory infarct with core infarct volume of 62 mL. The posterior left M2 segment is occluded with poor collaterals. 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 High-grade stenosis or occlusion of the left P2 segment.  CT perfusion Core 614m Perfusion 12385mMismatch 61 MRI-  acute nonhemorrhagic infarct involving the posterior left MCA territory and posterior left insula. Additional punctate foci of acute nonhemorrhagic infarct are present along the watershed distribution. 5mm62mute nonhemorrhagic infarct in the medial left thalamus MRA  No flow in the left ICA. Mod stenosis of the left P2  2D Echo EF 65-70% LDL 25 HgbA1c 7.2 VTE prophylaxis - SCDs No antithrombotic prior to admission, now on aspirin 81 mg daily and  Brilinta (ticagrelor) 90 mg bid.  Therapy recommendations: CIR -> may need SNF due to home support disposition:  pending  Occluded Left ICA anad MCA at M1 s/p mechanical thrombectomy with TICI2c revascularization  Stent assisted angioplasty of preocclusive left internal carotid artery at the bulb secondary to thick calcified atherosclerotic plaque Re occluded on MRA Continue brilinta  Hypertension Home meds:  lisinopril Stable BP <160 Cleviprex is off  Hyperlipidemia Home meds:  Atorvastatin '40mg'$  LDL 25, goal < 70 Continue statin at discharge  Diabetes type II Uncontrolled Home meds:  Metformin  HgbA1c 7.2, goal < 7.0 CBGs SSI- increasing coverage per diabetic coordinator recommendations 0-15u and 5u q4 for TF coverage  Other Stroke Risk Factors Advanced Age >/= 65  49garette smoker, advised to stop smoking Obesity, Body mass  index is 30.85 kg/m., BMI >/= 30 associated with increased stroke risk, recommend weight loss, diet and exercise as appropriate   Other Active Problems Acute Respiratory Failure Extubated 8/26 CCM on board as needed for respiratory management  Dysphagia SLP consult placed Cortrak in place  Hospital day # 5   Patient seen and examined by NP/APP with MD. MD to update note as needed.   Janine Ores, DNP, FNP-BC [facility] Pager: [phone]  I have personally obtained history,examined this patient, reviewed notes, independently viewed imaging studies, participated in medical decision making and plan of care.ROS completed by me personally and pertinent positives fully documented  I have made any additions or clarifications directly to the above note. Agree with note above.  Continue mobilization out of bed.  Ongoing therapies.  Transfer out of ICU when bed available.  Long discussion with patient's daughter at the bedside and answered questions.This patient is critically ill and at significant risk of neurological worsening, death  and care requires constant monitoring of vital signs, hemodynamics,respiratory and cardiac monitoring, extensive review of multiple databases, frequent neurological assessment, discussion with family, other specialists and medical decision making of high complexity.I have made any additions or clarifications directly to the above note.This critical care time does not reflect procedure time, or teaching time or supervisory time of PA/NP/Med Resident etc but could involve care discussion time.  I spent 30 minutes of neurocritical care time  in the care of  this patient.      Antony Contras, MD Medical Director [facility] Pager: [phone] 05/20/2022 2:58 PM   To contact Stroke Continuity provider, please refer to [email or web address] After hours, contact General Neurology

## 2022-05-20 NOTE — Inpatient Diabetes Management (Signed)
Inpatient Diabetes Program Recommendations  AACE/ADA: New Consensus Statement on Inpatient Glycemic Control (2015)  Target Ranges:  Prepandial:   less than 140 mg/dL      Peak postprandial:   less than 180 mg/dL (1-2 hours)      Critically ill patients:  140 - 180 mg/dL   Lab Results  Component Value Date   GLUCAP 281 (H) 05/20/2022   HGBA1C 7.0 (H) 05/16/2022    Review of Glycemic Control  Latest Reference Range & Units 05/19/22 23:48 05/20/22 04:24 05/20/22 07:26  Glucose-Capillary 70 - 99 mg/dL 286 (H) 253 (H) 281 (H)  (H): Data is abnormally high Diabetes history: DM2 Outpatient Diabetes medications: Invokana 100 mg daily, Amaryl 8 mg QAM, Metformin 1000 mg BID Current orders for Inpatient glycemic control: Novolog 0-15 units Q4H, Novolog 3 units Q4H; Jevity @ 55 ml/hr   Inpatient Diabetes Program Recommendations:     Consider increasing tube feed coverage: Novolog 8 units Q4H t(o be stopped or held in the event tube feeds are stopped).   Thanks, Bronson Curb, MSN, RNC-OB Diabetes Coordinator [phone] (8a-5p)

## 2022-05-20 NOTE — Progress Notes (Signed)
Speech Language Pathology Treatment: Dysphagia;Cognitive-Linquistic  Patient Details Name: Bailey [name] MRN: [id number] DOB: [birth year] Today's Date: 05/20/2022 Time: 9678-9381 SLP Time Calculation (min) (ACUTE ONLY): 14 min  Assessment / Plan / Recommendation Clinical Impression  SWALLOWING Pt seen for ongoing dysphagia management.  Pt asleep but easily roused.  With trials of ice chips and thin liquid by spoon anterior spillage of thin liquid from R side. Oral containment of bolus better with straw sip.  With puree there was prompt oral response and seemingly timely pharyngeal swallow response.  Oral clearance of puree was adequate with very little residuals removed by suction after bolus trial.  There were no overt s/s of aspiration with ice chips, thin liquid, or puree. Pt would benefit from instrumental assessment of swallow prior to intiation of PO diet.  Will plan for MBSS next date as long as pt maintains current LOA.  COMMUNICATION Pt with spontaneous vocalization today, but limited only to "mm-hmm."  Pt did not participate in social greeting, rote speech task, or naming.  Pt followed commands with 0% accuracy and answered paired yes/no questions with 50% accuracy (affirmative responses only).  Pt only produced nasal sounds today.  There was no appreciable movement of velum when pt asked to say "ahh," only continued humming.    HPI HPI: Pt is a 66 y.o. female who presented to the ED via EMS for AMS and R side weakness.  CTA revealed large L MCA infarct. Pt underwent endovascular revascularization of occluded left internal carotid artery artery and left middle cerebral artery M1 8/26. Intubated 8/25 and self extubated 8/26. PMH:  HTN, hyperlipidemia, diabetes, tobacco abuse.      SLP Plan  MBS      Recommendations for follow up therapy are one component of a multi-disciplinary discharge planning process, led by the attending physician.  Recommendations may be updated based on  patient status, additional functional criteria and insurance authorization.    Recommendations  Diet recommendations: NPO Medication Administration: Via alternative means                Oral Care Recommendations: Oral care QID Follow Up Recommendations: Acute inpatient rehab (3hours/day) Assistance recommended at discharge: Frequent or constant Supervision/Assistance SLP Visit Diagnosis: Dysphagia, oropharyngeal phase (R13.12) Plan: [place], [place], [place] Office: [phone] 05/20/2022, 12:39 PM

## 2022-05-21 ENCOUNTER — Inpatient Hospital Stay (HOSPITAL_COMMUNITY): Payer: Medicare HMO

## 2022-05-21 DIAGNOSIS — I63232 Cerebral infarction due to unspecified occlusion or stenosis of left carotid arteries: Secondary | ICD-10-CM | POA: Diagnosis not present

## 2022-05-21 LAB — GLUCOSE, CAPILLARY
Glucose-Capillary: 215 mg/dL — ABNORMAL HIGH (ref 70–99)
Glucose-Capillary: 227 mg/dL — ABNORMAL HIGH (ref 70–99)
Glucose-Capillary: 267 mg/dL — ABNORMAL HIGH (ref 70–99)
Glucose-Capillary: 276 mg/dL — ABNORMAL HIGH (ref 70–99)
Glucose-Capillary: 290 mg/dL — ABNORMAL HIGH (ref 70–99)
Glucose-Capillary: 321 mg/dL — ABNORMAL HIGH (ref 70–99)
Glucose-Capillary: 334 mg/dL — ABNORMAL HIGH (ref 70–99)

## 2022-05-21 MED ORDER — INSULIN GLARGINE-YFGN 100 UNIT/ML ~~LOC~~ SOLN
16.0000 [IU] | Freq: Every day | SUBCUTANEOUS | Status: DC
  Administered 2022-05-21 – 2022-05-27 (×7): 16 [IU] via SUBCUTANEOUS
  Filled 2022-05-21 (×8): qty 0.16

## 2022-05-21 MED ORDER — INSULIN ASPART 100 UNIT/ML IJ SOLN
10.0000 [IU] | INTRAMUSCULAR | Status: DC
  Administered 2022-05-21 – 2022-05-25 (×23): 10 [IU] via SUBCUTANEOUS

## 2022-05-21 NOTE — Progress Notes (Signed)
Per son pt. Does not eat beef

## 2022-05-21 NOTE — Progress Notes (Signed)
Physical Therapy Treatment Patient Details Name: Bailey [name] MRN: [id number] DOB: Mar 21, 1956 Today's Date: 05/21/2022   History of Present Illness The pt is a 66 yo female presenting 8/25 with R lateral lean, perseverative speech, and unable to follow commands. CTA revealed L MCA infarct. Pt underwent endovascular revascularization of occluded left internal carotid artery artery and left middle cerebral artery M1 8/26. Intubated 8/25 and self extubated 8/26. PMH includes: HTN, HLD, DM II, and tobacco use.    PT Comments    Pt is demonstrating improve midline alignment, sitting without UE support with min guard-minA for balance now. However, she remains limited in communication, balance, R-sided strength, and cognition, impacting her mobility and resulting in her still needing maxAx2 for bed mobility, maxAx2 to transfer to stand, and TA x2 to pivot/slide feet to transfer bed > recliner today. Attempted to encourage increased R-side attention via use of visual feedback from a mirror and hand-over-hand techniques, but limited to no success noted. Will continue to follow acutely. Current recommendations remain appropriate.    Recommendations for follow up therapy are one component of a multi-disciplinary discharge planning process, led by the attending physician.  Recommendations may be updated based on patient status, additional functional criteria and insurance authorization.  Follow Up Recommendations  Acute inpatient rehab (3hours/day)     Assistance Recommended at Discharge Frequent or constant Supervision/Assistance  Patient can return home with the following Two people to help with walking and/or transfers;Two people to help with bathing/dressing/bathroom;Assistance with cooking/housework;Assist for transportation;Help with stairs or ramp for entrance;Direct supervision/assist for medications management;Direct supervision/assist for financial management;Assistance with feeding    Equipment Recommendations  Other (comment) (defer to post acute)    Recommendations for Other Services       Precautions / Restrictions Precautions Precautions: Fall;Other (comment) Precaution Comments: R hemiparesis, R inattention, cortrak, L mitten, watch BP Restrictions Weight Bearing Restrictions: No     Mobility  Bed Mobility Overal bed mobility: Needs Assistance Bed Mobility: Supine to Sit   Sidelying to sit: +2 for physical assistance, +2 for safety/equipment, HOB elevated, Max assist       General bed mobility comments: Pt provided verbal and tactile cues to bring L leg off bed and place L hand on L bed rail to pull up to sit L EOB, maxAx2.    Transfers Overall transfer level: Needs assistance Equipment used: 2 person hand held assist Transfers: Sit to/from Stand, Bed to chair/wheelchair/BSC Sit to Stand: Max assist, +2 physical assistance, From elevated surface, +2 safety/equipment Stand pivot transfers: Total assist, +2 physical assistance, +2 safety/equipment         General transfer comment: Pt coming to stand from EOB with use of bed pad for hip extenstion and blocking of R knee, maxAx2. Pt then needing TAx2 to slide each foot to L and shift wt to pivot bed > recliner.    Ambulation/Gait               General Gait Details: unable   Stairs             Wheelchair Mobility    Modified Rankin (Stroke Patients Only) Modified Rankin (Stroke Patients Only) Pre-Morbid Rankin Score: No symptoms Modified Rankin: Severe disability     Balance Overall balance assessment: Needs assistance Sitting-balance support: Feet supported, Single extremity supported, No upper extremity supported Sitting balance-Leahy Scale: Fair Sitting balance - Comments: Pt with improved midline alignment, no longer pushing today. Thereby, only required min guard-minA for sitting balance at EOB  Standing balance support: Bilateral upper extremity supported Standing  balance-Leahy Scale: Zero Standing balance comment: dependent on therapist support and blocking R knee                            Cognition Arousal/Alertness: Awake/alert Behavior During Therapy: Flat affect (laughed several times) Overall Cognitive Status: Impaired/Different from baseline Area of Impairment: Attention, Memory, Following commands, Safety/judgement, Awareness, Problem solving                   Current Attention Level: Sustained Memory: Decreased short-term memory Following Commands: Follows one step commands inconsistently, Follows one step commands with increased time Safety/Judgement: Decreased awareness of safety, Decreased awareness of deficits Awareness: Intellectual Problem Solving: Slow processing, Decreased initiation, Difficulty sequencing, Requires verbal cues, Requires tactile cues General Comments: pt with aphasia. Able to visually track to a little past midline to R when not distracted and with max cues. Pt following cues with L side a little more consistently today, ~35% of time. Pt automatic to wipe her hair with her L hand, but unable to comprehend gestures to use comb to brush hair or to hand or grab comb to/from therapist. Pt made eye contact with self in mirror a few times, but did not sustain attention to it. Improved midline alignment and awareness to correct. Unable to sequence stepping to chair.        Exercises Other Exercises Other Exercises: attempted to increase R side attention with use of mirror and hand-over-hand to brush hair with L UE or with L UE over R hand, but no to min success    General Comments        Pertinent Vitals/Pain Pain Assessment Pain Assessment: Faces Faces Pain Scale: No hurt Pain Intervention(s): Monitored during session    Home Living                          Prior Function            PT Goals (current goals can now be found in the care plan section) Acute Rehab PT Goals Patient  Stated Goal: unable to state PT Goal Formulation: With patient/family Time For Goal Achievement: 05/31/22 Potential to Achieve Goals: Good Progress towards PT goals: Progressing toward goals    Frequency    Min 4X/week      PT Plan Current plan remains appropriate    Co-evaluation              AM-PAC PT "6 Clicks" Mobility   Outcome Measure  Help needed turning from your back to your side while in a flat bed without using bedrails?: A Lot Help needed moving from lying on your back to sitting on the side of a flat bed without using bedrails?: Total Help needed moving to and from a bed to a chair (including a wheelchair)?: Total Help needed standing up from a chair using your arms (e.g., wheelchair or bedside chair)?: Total Help needed to walk in hospital room?: Total Help needed climbing 3-5 steps with a railing? : Total 6 Click Score: 7    End of Session Equipment Utilized During Treatment: Gait belt Activity Tolerance: Patient tolerated treatment well Patient left: with call bell/phone within reach;in chair;with chair alarm set;with restraints reapplied Nurse Communication: Mobility status;Need for lift equipment PT Visit Diagnosis: Other abnormalities of gait and mobility (R26.89);Hemiplegia and hemiparesis;Unsteadiness on feet (R26.81);Muscle weakness (generalized) (M62.81);Difficulty in walking, not elsewhere  classified (R26.2);Other symptoms and signs involving the nervous system (R29.898) Hemiplegia - Right/Left: Right Hemiplegia - dominant/non-dominant: Dominant Hemiplegia - caused by: Cerebral infarction     Time: 4709-2957 PT Time Calculation (min) (ACUTE ONLY): 27 min  Charges:  $Therapeutic Activity: 8-22 mins $Neuromuscular Re-education: 8-22 mins                     Moishe Spice, PT, DPT Acute Rehabilitation Services  Office: [place] 05/21/2022, 5:13 PM

## 2022-05-21 NOTE — Progress Notes (Addendum)
STROKE TEAM PROGRESS NOTE   INTERVAL HISTORY Patient is seen in her room with her sister at the bedside. She has been hemodynamically stable and her neurological exam is stable.  She continues to be aphasic and will intermittently follow commands.  Vital signs are stable.  Speech therapy plan to do modified barium swallow eval later today. Vitals:   05/21/22 0700 05/21/22 0800 05/21/22 0900 05/21/22 1000  BP: (!) 142/49 137/67 (!) 142/49 (!) 146/52  Pulse: 75 84 79 76  Resp: '15 20 17 '$ (!) 22  Temp:  98.5 F (36.9 C)    TempSrc:  Axillary    SpO2: 98% 98% 98% 100%  Weight:      Height:       CBC:  Recent Labs  Lab 05/15/22 1656 05/15/22 1703 05/16/22 0405 05/19/22 0203 05/20/22 1105  WBC 6.9  --  12.1* 13.8* 13.0*  NEUTROABS 5.5  --  8.8*  --   --   HGB 6.1*   < > 10.7* 11.2* 11.0*  HCT 20.3*   < > 32.5* 35.4* 34.8*  MCV 74.4*  --  68.7* 68.7* 69.2*  PLT 158  --  272 179 279   < > = values in this interval not displayed.    Basic Metabolic Panel:  Recent Labs  Lab 05/16/22 0405 05/18/22 0241 05/18/22 1210  NA 136  --  137  K 3.5 4.1 4.2  CL 107  --  111  CO2 20*  --  18*  GLUCOSE 214*  --  226*  BUN 7*  --  11  CREATININE 0.54  --  0.53  CALCIUM 8.5*  --  8.2*  MG 1.7 2.1  --   PHOS 3.6 2.5  --     Lipid Panel:  Recent Labs  Lab 05/16/22 0405 05/17/22 0725  CHOL 131  --   TRIG 349* 79  HDL 36*  --   CHOLHDL 3.6  --   VLDL 70*  --   LDLCALC 25  --     HgbA1c:  Recent Labs  Lab 05/16/22 0405  HGBA1C 7.0*    Urine Drug Screen:  Recent Labs  Lab 05/16/22 0405  LABOPIA NONE DETECTED  COCAINSCRNUR NONE DETECTED  LABBENZ NONE DETECTED  AMPHETMU NONE DETECTED  THCU NONE DETECTED  LABBARB NONE DETECTED     Alcohol Level  Recent Labs  Lab 05/15/22 1824  ETH <10     IMAGING past 24 hours No results found.  PHYSICAL EXAM  Physical Exam  Constitutional: Obese middle-age African-American lady who is not in distress Cardiovascular:  Normal rate and regular rhythm.  Respiratory: Effort normal, non-labored breathing  Neuro: Mental Status: Eyes open.  Globally aphasic with left gaze deviation, does not track but will occasionally follow simple midline commands to pantomime Cranial Nerves: II: PERRL, 81m reactive III,IV, VI: No ptosis or diploplia. Left gaze deviation, does not cross midline. Does consistently track XI: Head turned to the left Motor: Tone is normal. Bulk is normal. Spontaneous movement noted on the left.  Trace movement on the right with flaccid hemiplegia, will move RLE to noxious stimuli.  Will not move RUE Sensory: Withdraws to pain on the left, minimally on the right    ASSESSMENT/PLAN Ms. SShanaye Riefis a 66y.o. female with history of HTN, hyperlipedmia, DM2, tobacco abuse presenting with waxing and waning stroke symptoms.  She last talked to family several days before admission.  Symptom discovery was at 2 PM on 8/26. Initial  head CT was read with no acute intracranial process. Her initial examination for ED provider was notable for fluctuating right-sided weakness.  She was making repetitive statements ("I can't talk") and not following commands. She was taken to IR emergently for a mechanical thrombectomy and stent assisted angioplasty of the left ICA. Remained intubated post procedure. MRI and MRA show reocclusion of stent. Extubated 8/27.   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 Small vessel disease. Atrophy. ASPECTS 10.    CTA head & neck - Left posterior MCA territory infarct with core infarct volume of 62 mL. The posterior left M2 segment is occluded with poor collaterals. High-grade stenosis or occlusion of the left internal carotid artery at the bifurcation with  reconstitution in the left cavernous segment, likely from the ophthalmic artery. High-grade stenosis or occlusion of the left P2 segment.  CT perfusion Core 37m, Perfusion 1229m Mismatch 61 MRI-  acute nonhemorrhagic infarct involving the posterior left MCA territory and posterior left insula. Additional punctate foci of acute nonhemorrhagic infarct are present along the watershed distribution. [id number] nonhemorrhagic infarct in the medial left thalamus MRA  No flow in the left ICA. Mod stenosis of the left P2  2D Echo EF 65-70% LDL 25 HgbA1c 7.2 VTE prophylaxis - SCDs No antithrombotic prior to admission, now on aspirin 81 mg daily and Brilinta (ticagrelor) 90 mg bid.  Therapy recommendations: Inpatient rehab  disposition:  pending  Occluded Left ICA anad MCA at M1 s/p mechanical thrombectomy with TICI2c revascularization  Stent assisted angioplasty of preocclusive left internal carotid artery at the bulb secondary to thick calcified atherosclerotic plaque Re occluded on MRA Continue brilinta  Hypertension Home meds:  lisinopril Stable BP <160 Cleviprex is off  Hyperlipidemia Home meds:  Atorvastatin '40mg'$  LDL 25, goal < 70 Continue statin at discharge  Diabetes type II Uncontrolled Home meds:  Metformin  HgbA1c 7.2, goal < 7.0 CBGs SSI- increasing coverage per diabetic coordinator recommendations 0-15u and 10u q4 for TF coverage 16u insulin glargine daily  Other Stroke Risk Factors Advanced Age >/= 65 54igarette smoker, advised to stop smoking Obesity, Body mass index is 30.85 kg/m., BMI >/= 30 associated with increased stroke risk, recommend weight loss, diet and exercise as appropriate   Other Active Problems Acute Respiratory Failure Extubated 8/26 CCM on board as needed for respiratory management  Dysphagia SLP consult placed Cortrak placed, will consider PEG tube  Hospital day # 6   Patient seen and examined by NP/APP with MD. MD to update note as needed.    CorFarmerMSN, AGACNP-BC [facility] See Amion for schedule and pager information 05/21/2022 10:17 AM   I have personally obtained history,examined this patient, reviewed notes, independently viewed imaging studies, participated in medical decision making and plan of care.ROS completed by me personally and pertinent positives fully documented  I have made any additions or clarifications directly to the above note. Agree with note above.  Continue aspirin and Brilinta for stroke prevention.  Speech therapy to do modified barium swallow later today if patient does not do well may need to consider doing PEG tube.  Long discussion with patient and sister at the bedside and answered questions.  Discussed with speech therapist.This patient is critically ill and at significant risk  of neurological worsening, death and care requires constant monitoring of vital signs, hemodynamics,respiratory and cardiac monitoring, extensive review of multiple databases, frequent neurological assessment, discussion with family, other specialists and medical decision making of high complexity.I have made any additions or clarifications directly to the above note.This critical care time does not reflect procedure time, or teaching time or supervisory time of PA/NP/Med Resident etc but could involve care discussion time.  I spent 30 minutes of neurocritical care time  in the care of  this patient.      Antony Contras, MD Medical Director [place] Pager: [phone] 05/21/2022 12:55 PM   To contact Stroke Continuity provider, please refer to [email or web address] After hours, contact General Neurology

## 2022-05-21 NOTE — Progress Notes (Signed)
Modified Barium Swallow Progress Note  Patient Details  Name: Bailey [name] MRN: [id number] Date of Birth: 09-Nov-1955  Today's Date: 05/21/2022  Modified Barium Swallow completed.  Full report located under Chart Review in the Imaging Section.  Brief recommendations include the following:  Clinical Impression  Pt presents with a moderate oral dysphagia c/b reduced lingual strength and coordination and possible bolus inattention.  These deficits resulted in oral holding of bolus trials, prolonged mastication, and oral residuals.  Pt did not benefit from verbal cues to swallow or to expectorate residuals.  Pharyngeal phase of swallow was grossly normal with transient penetration of thin liquid by straw on initial trial of study only.  There was no further penetration or aspiration on any other trials of any consistencies, including further trials of thin liquid by cup and straw and larger liquid bolus size.    Recommend puree solids with thin liquid. Pt will need 1:1 feeding assistance.   Swallow Evaluation Recommendations       SLP Diet Recommendations: Dysphagia 1 (Puree) solids;Thin liquid   Liquid Administration via: Cup;Straw   Medication Administration: Crushed with puree   Supervision: Full assist for feeding   Compensations: Slow rate;Small sips/bites   Postural Changes: Seated upright at 90 degrees   Oral Care Recommendations: Oral care BID        Celedonio Savage, [place], [place] Office: [phone] 05/21/2022,10:59 AM

## 2022-05-21 NOTE — Inpatient Diabetes Management (Signed)
Inpatient Diabetes Program Recommendations  AACE/ADA: New Consensus Statement on Inpatient Glycemic Control (2015)  Target Ranges:  Prepandial:   less than 140 mg/dL      Peak postprandial:   less than 180 mg/dL (1-2 hours)      Critically ill patients:  140 - 180 mg/dL   Lab Results  Component Value Date   GLUCAP 276 (H) 05/21/2022   HGBA1C 7.0 (H) 05/16/2022    Review of Glycemic Control  Latest Reference Range & Units 05/20/22 19:18 05/20/22 23:52 05/21/22 03:42 05/21/22 07:40  Glucose-Capillary 70 - 99 mg/dL 282 (H) 308 (H) 267 (H) 276 (H)  (H): Data is abnormally high Diabetes history: DM2 Outpatient Diabetes medications: Invokana 100 mg daily, Amaryl 8 mg QAM, Metformin 1000 mg BID Current orders for Inpatient glycemic control: Novolog 0-15 units Q4H, Novolog 5 units Q4H; Jevity @ 55 ml/hr   Inpatient Diabetes Program Recommendations:       Consider increasing tube feed coverage: Novolog 10 units Q4H t(o be stopped or held in the event tube feeds are stopped) and adding Semglee 16 units QD.   Thanks, Bronson Curb, MSN, RNC-OB Diabetes Coordinator [phone] (8a-5p)

## 2022-05-22 ENCOUNTER — Other Ambulatory Visit: Payer: Self-pay | Admitting: Family Medicine

## 2022-05-22 DIAGNOSIS — I63232 Cerebral infarction due to unspecified occlusion or stenosis of left carotid arteries: Secondary | ICD-10-CM | POA: Diagnosis not present

## 2022-05-22 LAB — BASIC METABOLIC PANEL
Anion gap: 9 (ref 5–15)
BUN: 33 mg/dL — ABNORMAL HIGH (ref 8–23)
CO2: 25 mmol/L (ref 22–32)
Calcium: 9 mg/dL (ref 8.9–10.3)
Chloride: 103 mmol/L (ref 98–111)
Creatinine, Ser: 0.66 mg/dL (ref 0.44–1.00)
GFR, Estimated: >60 mL/min (ref >60)
Glucose, Bld: 238 mg/dL — ABNORMAL HIGH (ref 70–99)
Potassium: 4.5 mmol/L (ref 3.5–5.1)
Sodium: 137 mmol/L (ref 135–145)

## 2022-05-22 LAB — GLUCOSE, CAPILLARY
Glucose-Capillary: 296 mg/dL — ABNORMAL HIGH (ref 70–99)
Glucose-Capillary: 340 mg/dL — ABNORMAL HIGH (ref 70–99)
Glucose-Capillary: 207 mg/dL — ABNORMAL HIGH (ref 70–99)
Glucose-Capillary: 321 mg/dL — ABNORMAL HIGH (ref 70–99)

## 2022-05-22 LAB — CBC
HCT: 34.7 % — ABNORMAL LOW (ref 36.0–46.0)
Hemoglobin: 10.9 g/dL — ABNORMAL LOW (ref 12.0–15.0)
MCH: 21.5 pg — ABNORMAL LOW (ref 26.0–34.0)
MCHC: 31.4 g/dL (ref 30.0–36.0)
MCV: 68.6 fL — ABNORMAL LOW (ref 80.0–100.0)
Platelets: 306 10*3/uL (ref 150–400)
RBC: 5.06 MIL/uL (ref 3.87–5.11)
RDW: 18.3 % — ABNORMAL HIGH (ref 11.5–15.5)
WBC: 13.8 10*3/uL — ABNORMAL HIGH (ref 4.0–10.5)
nRBC: 0 % (ref 0.0–0.2)

## 2022-05-22 MED ORDER — GLUCERNA 1.5 CAL PO LIQD
1000.0000 mL | ORAL | Status: DC
  Administered 2022-05-22 – 2022-05-23 (×2): 1000 mL
  Filled 2022-05-22 (×3): qty 1000

## 2022-05-22 MED ORDER — FREE WATER
50.0000 mL | Status: DC
  Administered 2022-05-22 – 2022-05-25 (×17): 50 mL

## 2022-05-22 NOTE — Progress Notes (Signed)
Nutrition Follow-up  DOCUMENTATION CODES:   Not applicable  INTERVENTION:  Continue current diet as ordered, pt requires assistance with feeding Encourage PO intake. Adjust TF regimen due to elevated glucose. Recommend the following Glucerna 1.5 @ 34m/h (1.2L/d) Free water flush [id number] This will provide 1800kcal, 99g of protein, and 121125mf free water (TF+flush)  NUTRITION DIAGNOSIS:  Inadequate oral intake related to inability to eat as evidenced by NPO status. - remains applicable  GOAL:   Patient will meet greater than or equal to 90% of their needs - progressing  MONITOR:   TF tolerance  REASON FOR ASSESSMENT:   Consult Enteral/tube feeding initiation and management  ASSESSMENT:   Pt with PMH of HTN, HLD, DM, CODP, GERD, tobacco abuse admitted with L MCA stroke s/p mechanical thrombectomy of L ICA/MCA/ACA.  8/29 - cortrak placed (duodenal bulb) 8/31 - MBS, DYS 1, thin liquids  Pt resting in bed at the time of assessment. Diet was advanced yesterday, but intake is still minimal. Lunch tray has just been delivered and NT reports she only ate her grits this AM.   Discussed TF with RN, glucose quite elevated. Will adjust formula to glucerna to aid in lowering carbohydrates.   Nutritionally Relevant Medications: Scheduled Meds:  docusate  100 mg Per Tube BID   feeding supplement (PROSource TF20)  60 mL Per Tube Daily   insulin aspart  0-15 Units Subcutaneous Q4H   insulin aspart  10 Units Subcutaneous Q4H   insulin glargine-yfgn  16 Units Subcutaneous Daily   polyethylene glycol  17 g Per Tube BID   Continuous Infusions:  feeding supplement (JEVITY 1.5 CAL/FIBER) 1,000 mL (05/21/22 1419)   Labs Reviewed: CBG ranges from 215-340 mg/dL over the last 24 hours HgbA1c 7%  NUTRITION - FOCUSED PHYSICAL EXAM: Flowsheet Row Most Recent Value  Orbital Region No depletion  Upper Arm Region No depletion  Thoracic and Lumbar Region No depletion  Buccal Region No  depletion  Temple Region No depletion  Clavicle Bone Region No depletion  Clavicle and Acromion Bone Region No depletion  Scapular Bone Region No depletion  Dorsal Hand No depletion  Patellar Region Moderate depletion  Anterior Thigh Region Moderate depletion  Posterior Calf Region Mild depletion  Edema (RD Assessment) None  Hair Reviewed  Eyes Reviewed  Mouth Unable to assess  Skin Reviewed  Nails Reviewed       Diet Order:   Diet Order             DIET - DYS 1 Room service appropriate? Yes; Fluid consistency: Thin  Diet effective now                   EDUCATION NEEDS:   No education needs have been identified at this time  Skin:  Skin Assessment: Reviewed RN Assessment  Last BM:  9/1 - type 5  Height:  Ht Readings from Last 1 Encounters:  05/16/22 '5\' 5"'$  (1.651 m)    Weight:  Wt Readings from Last 1 Encounters:  05/22/22 88.2 kg    BMI:  Body mass index is 32.36 kg/m.  Estimated Nutritional Needs:  Kcal:  1700-2000kcal/day Protein:  85-100g/day Fluid:  1.4-1.6L/day   RacRanell PatrickD, LDN Clinical Dietitian RD pager # available in AMIMilling[place]fter hours/weekend pager # available in [facility]

## 2022-05-22 NOTE — Progress Notes (Signed)
STROKE TEAM PROGRESS NOTE   INTERVAL HISTORY Patient is seen in her room with no family at the bedside. She remains hemodynamically stable and her neurological exam is unchanged.  She continues to be aphasic and will rarely follow commands.  Vital signs are stable.  Speech therapy recommends dysphagia 1 pure solids and thin liquid diet. Vitals:   05/22/22 0400 05/22/22 0500 05/22/22 0814 05/22/22 1219  BP: (!) 146/59  (!) 155/62 (!) 115/50  Pulse: 100  98 90  Resp: 18  18   Temp: 98.4 F (36.9 C)  97.8 F (36.6 C)   TempSrc: Axillary     SpO2: 98%  99% 98%  Weight:  88.2 kg    Height:       CBC:  Recent Labs  Lab 05/15/22 1656 05/15/22 1703 05/16/22 0405 05/19/22 0203 05/20/22 1105 05/22/22 0613  WBC 6.9  --  12.1*   < > 13.0* 13.8*  NEUTROABS 5.5  --  8.8*  --   --   --   HGB 6.1*   < > 10.7*   < > 11.0* 10.9*  HCT 20.3*   < > 32.5*   < > 34.8* 34.7*  MCV 74.4*  --  68.7*   < > 69.2* 68.6*  PLT 158  --  272   < > 279 306   < > = values in this interval not displayed.   Basic Metabolic Panel:  Recent Labs  Lab 05/16/22 0405 05/18/22 0241 05/18/22 1210 05/22/22 0613  NA 136  --  137 137  K 3.5 4.1 4.2 4.5  CL 107  --  111 103  CO2 20*  --  18* 25  GLUCOSE 214*  --  226* 238*  BUN 7*  --  11 33*  CREATININE 0.54  --  0.53 0.66  CALCIUM 8.5*  --  8.2* 9.0  MG 1.7 2.1  --   --   PHOS 3.6 2.5  --   --    Lipid Panel:  Recent Labs  Lab 05/16/22 0405 05/17/22 0725  CHOL 131  --   TRIG 349* 79  HDL 36*  --   CHOLHDL 3.6  --   VLDL 70*  --   LDLCALC 25  --    HgbA1c:  Recent Labs  Lab 05/16/22 0405  HGBA1C 7.0*   Urine Drug Screen:  Recent Labs  Lab 05/16/22 0405  LABOPIA NONE DETECTED  COCAINSCRNUR NONE DETECTED  LABBENZ NONE DETECTED  AMPHETMU NONE DETECTED  THCU NONE DETECTED  LABBARB NONE DETECTED    Alcohol Level  Recent Labs  Lab 05/15/22 1824  ETH <10    IMAGING past 24 hours No results found.  PHYSICAL EXAM  Physical Exam   Constitutional: Obese middle-age African-American lady who is not in distress Cardiovascular: Normal rate and regular rhythm.  Respiratory: Effort normal, non-labored breathing  Neuro: Mental Status: Eyes open.  Globally aphasic with left gaze deviation, does not track but will occasionally follow simple midline commands to pantomime Cranial Nerves: II: PERRL, 9m reactive III,IV, VI: No ptosis or diploplia. Left gaze deviation, does not cross midline. Does consistently track XI: Head turned to the left Motor: Tone is normal. Bulk is normal. Spontaneous movement noted on the left.  Trace movement on the right with flaccid hemiplegia, will move RLE to noxious stimuli.  Will not move RUE Sensory: Withdraws to pain on the left, minimally on the right    ASSESSMENT/PLAN Ms. SDeklyn Trachtenbergis a 66  y.o. female with history of HTN, hyperlipedmia, DM2, tobacco abuse presenting with waxing and waning stroke symptoms.  She last talked to family several days before admission.  Symptom discovery was at 2 PM on 8/26. Initial head CT was read with no acute intracranial process. Her initial examination for ED provider was notable for fluctuating right-sided weakness.  She was making repetitive statements ("I can't talk") and not following commands. She was taken to IR emergently for a mechanical thrombectomy and stent assisted angioplasty of the left ICA. Remained intubated post procedure. MRI and MRA show reocclusion of stent. Extubated 8/27.   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 Small vessel disease. Atrophy. ASPECTS 10.    CTA head & neck - Left posterior MCA territory infarct with core infarct volume of 62 mL. The posterior left M2 segment is occluded with poor collaterals.  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 High-grade stenosis or occlusion of the left P2 segment.  CT perfusion Core 38m, Perfusion 1238m Mismatch 61 MRI-  acute nonhemorrhagic infarct involving the posterior left MCA territory and posterior left insula. Additional punctate foci of acute nonhemorrhagic infarct are present along the watershed distribution. 73m24mcute nonhemorrhagic infarct in the medial left thalamus MRA  No flow in the left ICA. Mod stenosis of the left P2  2D Echo EF 65-70% LDL 25 HgbA1c 7.2 VTE prophylaxis - SCDs No antithrombotic prior to admission, now on aspirin 81 mg daily and Brilinta (ticagrelor) 90 mg bid.  Therapy recommendations: Inpatient rehab  disposition:  pending  Occluded Left ICA anad MCA at M1 s/p mechanical thrombectomy with TICI2c revascularization  Stent assisted angioplasty of preocclusive left internal carotid artery at the bulb secondary to thick calcified atherosclerotic plaque Re occluded on MRA Continue brilinta  Hypertension Home meds:  lisinopril Stable BP <160 Cleviprex is off  Hyperlipidemia Home meds:  Atorvastatin '40mg'$  LDL 25, goal < 70 Continue statin at discharge  Diabetes type II Uncontrolled Home meds:  Metformin  HgbA1c 7.2, goal < 7.0 CBGs SSI- increasing coverage per diabetic coordinator recommendations 0-15u and 10u q4 for TF coverage 16u insulin glargine daily  Other Stroke Risk Factors Advanced Age >/= 65 31igarette smoker, advised to stop smoking Obesity, Body mass index is 32.36 kg/m., BMI >/= 30 associated with increased stroke risk, recommend weight loss, diet and exercise as appropriate   Other Active Problems Acute Respiratory Failure Extubated 8/26 CCM on board as needed for respiratory management  Dysphagia SLP consult placed Cortrak placed, will consider PEG  tube  Hospital day # 7   Continue aspirin and Brilinta for stroke prevention.  Speech therapy recommends dysphagia 1 diet.  Encourage p.o. intake.  Continue mobilization out of bed and ongoing physical occupational and speech therapy.  Inpatient rehab consult.  No family available at the bedside for discussion.  Greater than 50% time during this 25-minute visit was spent in counseling and coordination of care discussion patient and care team answering questions     PraAntony ContrasD Medical Director [place]: [phone]/2023 2:28 PM   To contact Stroke Continuity provider, please refer to [email or web address], contact General Neurology

## 2022-05-22 NOTE — Care Management Important Message (Signed)
Important Message  Patient Details  Name: Bailey [name] MRN: [id number] Date of Birth: 1955-10-25   Medicare Important Message Given:  Yes     Orbie Pyo 05/22/2022, 3:03 PM

## 2022-05-22 NOTE — NC FL2 (Addendum)
[place] MEDICAID FL2 LEVEL OF CARE SCREENING TOOL     IDENTIFICATION  Patient Name: Bailey [name] Birthdate: Feb 25, 1956 Sex: female Admission Date (Current Location): 05/15/2022  [facility] and [place] Number:  Herbalist and Address:  [facility] 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   Acute cerebrovascular accident (CVA) due to occlusion of left carotid artery ([place]) 05/15/2022   Osteoporosis 10/16/2021   Type 2 diabetes mellitus with diabetic neuropathy, unspecified ([place]) 07/02/2021   Delta beta thalassemia ([place]) 11/22/2019   Diabetic retinopathy ([place]) 02/20/2017   Fatty liver 11/24/2016   GERD (gastroesophageal reflux disease) 07/26/2015   Hyperlipidemia 07/26/2015   Dyspepsia 07/26/2015   Essential hypertension 07/26/2015   Fibrocystic breast disease 03/22/2015   Type 2 diabetes mellitus treated without insulin ([place]) 02/16/2013    Orientation RESPIRATION BLADDER Height & Weight      (aphasic, unable to assess)  Normal Incontinent Weight: 194 lb 7.1 oz (88.2 kg) Height:  '5\' 5"'$  (165.1 cm)  BEHAVIORAL SYMPTOMS/MOOD NEUROLOGICAL BOWEL NUTRITION STATUS      Incontinent Diet (See dc summary)  AMBULATORY STATUS COMMUNICATION OF NEEDS Skin   Extensive Assist Verbally Normal                       Personal Care Assistance Level of Assistance  Bathing, Feeding, Dressing Bathing Assistance: Maximum assistance Feeding assistance: Maximum assistance Dressing Assistance: Maximum assistance      Functional Limitations Info  Speech     Speech Info: Impaired (expressive aphasia)    SPECIAL CARE FACTORS FREQUENCY  PT (By licensed PT), OT (By licensed OT), Speech therapy     PT Frequency: 5x/week OT Frequency: 5x/week     Speech Therapy Frequency: 5x/week      Contractures Contractures Info: Not present    Additional Factors Info  Code Status, Allergies, Insulin Sliding Scale Code Status Info: Full Allergies Info: NKA   Insulin Sliding Scale Info: See dc summary       Current Medications (05/22/2022):  This is the current hospital active medication list Current Facility-Administered Medications  Medication Dose Route Frequency Provider Last Rate Last Admin   acetaminophen (TYLENOL) tablet 650 mg  650 mg Oral Q4H PRN Deveshwar, Willaim Rayas, MD       Or   acetaminophen (TYLENOL) 160 MG/5ML solution 650 mg  650 mg Per Tube Q4H PRN Luanne Bras, MD   650 mg at 05/19/22 4536   Or   acetaminophen (TYLENOL) suppository 650 mg  650 mg Rectal Q4H PRN Deveshwar, Willaim Rayas, MD       amLODipine (NORVASC) tablet 5 mg  5 mg Per Tube Daily Garvin Fila, MD   5 mg at 05/22/22 1157   aspirin chewable tablet 81 mg  81 mg Oral Daily Deveshwar, Sanjeev, MD       Or   aspirin chewable tablet 81 mg  81 mg Per Tube Daily Luanne Bras, MD   81 mg at 05/22/22 1105   Chlorhexidine Gluconate  Cloth 2 % PADS 6 each  6 each Topical Daily Bhagat, Srishti L, MD   6 each at 05/19/22 1800   docusate (COLACE) 50 MG/5ML liquid 100 mg  100 mg Per Tube BID Audria Nine, DO   100 mg at 05/22/22 1104   enoxaparin (LOVENOX) injection 40 mg  40 mg Subcutaneous Q24H Garvin Fila, MD   40 mg at 05/22/22 1107   feeding supplement (JEVITY 1.5 CAL/FIBER) liquid 1,000 mL  1,000 mL Per Tube Continuous Rosalin Hawking, MD 55 mL/hr at 05/21/22 1419 1,000 mL at 05/21/22 1419   feeding supplement (PROSource TF20) liquid 60 mL  60 mL Per Tube Daily Spero Geralds, MD   60 mL at 05/22/22 1107   insulin  aspart (novoLOG) injection 0-15 Units  0-15 Units Subcutaneous Q4H Garvin Fila, MD   5 Units at 05/22/22 1157   insulin aspart (novoLOG) injection 10 Units  10 Units Subcutaneous Q4H de Yolanda Manges, Cortney E, NP   10 Units at 05/22/22 1157   insulin glargine-yfgn (SEMGLEE) injection 16 Units  16 Units Subcutaneous Daily de Yolanda Manges, Cortney E, NP   16 Units at 05/22/22 1106   labetalol (NORMODYNE) injection 20 mg  20 mg Intravenous Q10 min PRN Janine Ores, NP       lisinopril (ZESTRIL) tablet 40 mg  40 mg Per Tube Daily Janine Ores, NP   40 mg at 05/22/22 1105   Oral care mouth rinse  15 mL Mouth Rinse 4 times per day Garvin Fila, MD   15 mL at 05/22/22 1247   Oral care mouth rinse  15 mL Mouth Rinse PRN Garvin Fila, MD       polyethylene glycol (MIRALAX / GLYCOLAX) packet 17 g  17 g Per Tube BID Garvin Fila, MD   17 g at 05/22/22 1107   senna-docusate (Senokot-S) tablet 1 tablet  1 tablet Per Tube QHS PRN Bhagat, Srishti L, MD       sodium chloride 0.9 % bolus 1,000 mL  1,000 mL Intravenous Once Bhagat, Srishti L, MD       sodium chloride flush (NS) 0.9 % injection 3 mL  3 mL Intravenous Once Blanchie Dessert, MD       ticagrelor (BRILINTA) tablet 90 mg  90 mg Oral BID Deveshwar, Willaim Rayas, MD       Or   ticagrelor (BRILINTA) tablet 90 mg  90 mg Per Tube BID Luanne Bras, MD   90 mg at 05/22/22 1104     Discharge Medications: Please see discharge summary for a list of discharge medications.  Relevant Imaging Results:  Relevant Lab Results:   Additional Information SSN: [id number]  Geralynn Ochs, LCSW  I have personally obtained history,examined this patient, reviewed notes, independently viewed imaging studies, participated in medical decision making and plan of care.ROS completed by me personally and pertinent positives fully documented  I have made any additions or clarifications directly to the above note. Agree with note above.    Antony Contras,  MD Medical Director [place] Pager: [phone] 05/22/2022 7:41 PM

## 2022-05-22 NOTE — Inpatient Diabetes Management (Signed)
Inpatient Diabetes Program Recommendations  AACE/ADA: New Consensus Statement on Inpatient Glycemic Control (2015)  Target Ranges:  Prepandial:   less than 140 mg/dL      Peak postprandial:   less than 180 mg/dL (1-2 hours)      Critically ill patients:  140 - 180 mg/dL   Lab Results  Component Value Date   GLUCAP 296 (H) 05/22/2022   HGBA1C 7.0 (H) 05/16/2022    Review of Glycemic Control  Latest Reference Range & Units 05/21/22 07:40 05/21/22 11:20 05/21/22 12:02 05/21/22 15:57 05/21/22 19:42 05/21/22 23:37 05/22/22 03:54  Glucose-Capillary 70 - 99 mg/dL 276 (H) 227 (H) 215 (H) 321 (H) 334 (H) 290 (H) 296 (H)  (H): Data is abnormally high  Diabetes history: DM2 Outpatient Diabetes medications: Invokana 100 mg daily, Amaryl 8 mg QAM, Metformin 1000 mg BID Current orders for Inpatient glycemic control: Semglee 16 units, Novolog 0-15 units Q4H, Novolog 10 units Q4H; Jevity @ 55 ml/hr  Inpatient Diabetes Program Recommendations:   Continued hyperglycemia with tube feeds. -Increase Novolog tube feed coverage to 12 units q 4 hrs. (Hold if tube feed held or stopped for any reason)  Thank you, Bailey Roys E. Rozalyn Osland, RN, MSN, CDE  Diabetes Coordinator Inpatient Glycemic Control Team Team Pager [phone] (8am-5pm) 05/22/2022 12:25 PM

## 2022-05-22 NOTE — TOC Initial Note (Signed)
Transition of Care [facility]) - Initial/Assessment Note    Patient Details  Name: Bailey [name] MRN: [id number] Date of Birth: 1955/12/10  [facility] CM/SW Contact:    Geralynn Ochs, LCSW Phone Number: 05/22/2022, 1:45 PM  Clinical Narrative:            CSW met with patient's son and sister at bedside to discuss SNF placement and answer questions about pursuing guardianship over the patient to manage her finances in the interim while she is in rehab. CSW explained to patient's family the process for pursuing guardianship and they indicated understanding. CSW also discussed SNF placement, family is in agreement. CSW received permission to fax out referral and follow up with family on offers received. Patient not medically stable at this time as she still has cortrak placed. CSW to follow.   Expected Discharge Plan: Skilled Nursing Facility Barriers to Discharge: Continued Medical Work up, Ship broker   Patient Goals and CMS Choice Patient states their goals for this hospitalization and ongoing recovery are:: patient unable to participate in goal setting, not fully oriented CMS Medicare.gov Compare Post Acute Care list provided to:: Patient Choice offered to / list presented to : Adult Children, Sibling  Expected Discharge Plan and Services Expected Discharge Plan: [place] Choice: [place] arrangements for the past 2 months: Single Family Home                                      Prior Living Arrangements/Services Living arrangements for the past 2 months: Single Family Home Lives with:: Self Patient language and need for interpreter reviewed:: No Do you feel safe going back to the place where you live?: Yes      Need for Family Participation in Patient Care: Yes (Comment) Care giver support system in place?: No (comment)   Criminal Activity/Legal Involvement Pertinent to Current  Situation/Hospitalization: No - Comment as needed  Activities of Daily Living      Permission Sought/Granted Permission sought to share information with : Facility Sport and exercise psychologist, Family Supports Permission granted to share information with : Yes, Verbal Permission Granted  Share Information with NAME: Darerro, Silvana Newness  Permission granted to share info w AGENCY: SNF  Permission granted to share info w Relationship: Son, Sister     Emotional Assessment   Attitude/Demeanor/Rapport: Unable to Assess Affect (typically observed): Unable to Assess Orientation: : Oriented to Self Alcohol / Substance Use: Not Applicable Psych Involvement: No (comment)  Admission diagnosis:  Altered mental status, unspecified altered mental status type [R41.82] Acute cerebrovascular accident (CVA) due to occlusion of left carotid artery ([place]) [I63.232] Middle cerebral artery embolism, left [I66.02] Patient Active Problem List   Diagnosis Date Noted   Middle cerebral artery embolism, 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   Acute cerebrovascular accident (CVA) due to occlusion of left carotid artery ([place]) 05/15/2022   Osteoporosis 10/16/2021   Type 2 diabetes mellitus with diabetic neuropathy, unspecified ([place]) 07/02/2021   Delta beta thalassemia ([place]) 11/22/2019   Diabetic retinopathy ([place]) 02/20/2017   Fatty liver 11/24/2016   GERD (gastroesophageal reflux disease) 07/26/2015   Hyperlipidemia 07/26/2015   Dyspepsia 07/26/2015   Essential hypertension 07/26/2015   Fibrocystic breast disease 03/22/2015   Type 2 diabetes mellitus treated without insulin ([place]) 02/16/2013   PCP:  Darreld Mclean, MD Pharmacy:   [facility] [place]  [place], [place] [address] Phone: [phone] Fax: [place], [place] - [place] [address] [place] [address] Phone: [phone]  Fax: [place] [address], [place] - [address] [place] [place] [address] Phone: [phone] Fax: [phone]     Social Determinants of Health (SDOH) Interventions    Readmission Risk Interventions     No data to display

## 2022-05-22 NOTE — Plan of Care (Signed)
Problem: Education: Goal: Understanding of CV disease, CV risk reduction, and recovery process will improve Outcome: Progressing Goal: Individualized Educational Video(s) Outcome: Progressing   Problem: Activity: Goal: Ability to return to baseline activity level will improve Outcome: Progressing   Problem: Cardiovascular: Goal: Ability to achieve and maintain adequate cardiovascular perfusion will improve Outcome: Progressing Goal: Vascular access site(s) Level 0-1 will be maintained Outcome: Progressing   Problem: Health Behavior/Discharge Planning: Goal: Ability to safely manage health-related needs after discharge will improve Outcome: Progressing   Problem: Education: Goal: Ability to describe self-care measures that may prevent or decrease complications (Diabetes Survival Skills Education) will improve Outcome: Progressing Goal: Individualized Educational Video(s) Outcome: Progressing   Problem: Coping: Goal: Ability to adjust to condition or change in health will improve Outcome: Progressing   Problem: Fluid Volume: Goal: Ability to maintain a balanced intake and output will improve Outcome: Progressing   Problem: Health Behavior/Discharge Planning: Goal: Ability to identify and utilize available resources and services will improve Outcome: Progressing Goal: Ability to manage health-related needs will improve Outcome: Progressing   Problem: Metabolic: Goal: Ability to maintain appropriate glucose levels will improve Outcome: Progressing   Problem: Nutritional: Goal: Maintenance of adequate nutrition will improve Outcome: Progressing Goal: Progress toward achieving an optimal weight will improve Outcome: Progressing   Problem: Skin Integrity: Goal: Risk for impaired skin integrity will decrease Outcome: Progressing   Problem: Tissue Perfusion: Goal: Adequacy of tissue perfusion will improve Outcome: Progressing   Problem: Education: Goal: Knowledge  of General Education information will improve Description: Including pain rating scale, medication(s)/side effects and non-pharmacologic comfort measures Outcome: Progressing   Problem: Health Behavior/Discharge Planning: Goal: Ability to manage health-related needs will improve Outcome: Progressing   Problem: Clinical Measurements: Goal: Ability to maintain clinical measurements within normal limits will improve Outcome: Progressing Goal: Will remain free from infection Outcome: Progressing Goal: Diagnostic test results will improve Outcome: Progressing Goal: Respiratory complications will improve Outcome: Progressing Goal: Cardiovascular complication will be avoided Outcome: Progressing   Problem: Activity: Goal: Risk for activity intolerance will decrease Outcome: Progressing   Problem: Nutrition: Goal: Adequate nutrition will be maintained Outcome: Progressing   Problem: Coping: Goal: Level of anxiety will decrease Outcome: Progressing   Problem: Elimination: Goal: Will not experience complications related to bowel motility Outcome: Progressing Goal: Will not experience complications related to urinary retention Outcome: Progressing   Problem: Pain Managment: Goal: General experience of comfort will improve Outcome: Progressing   Problem: Safety: Goal: Ability to remain free from injury will improve Outcome: Progressing   Problem: Skin Integrity: Goal: Risk for impaired skin integrity will decrease Outcome: Progressing   Problem: Safety: Goal: Non-violent Restraint(s) Outcome: Progressing   Problem: Education: Goal: Knowledge of disease or condition will improve Outcome: Progressing Goal: Knowledge of secondary prevention will improve (SELECT ALL) Outcome: Progressing Goal: Knowledge of patient specific risk factors will improve (INDIVIDUALIZE FOR PATIENT) Outcome: Progressing Goal: Individualized Educational Video(s) Outcome: Progressing   Problem:  Coping: Goal: Will verbalize positive feelings about self Outcome: Progressing Goal: Will identify appropriate support needs Outcome: Progressing   Problem: Health Behavior/Discharge Planning: Goal: Ability to manage health-related needs will improve Outcome: Progressing   Problem: Self-Care: Goal: Ability to participate in self-care as condition permits will improve Outcome: Progressing Goal: Verbalization of feelings and concerns over difficulty with self-care will improve Outcome: Progressing Goal: Ability to communicate needs accurately will improve Outcome: Progressing   Problem: Nutrition: Goal: Risk of aspiration will decrease Outcome: Progressing Goal: Dietary intake will improve Outcome: Progressing  Problem: Ischemic Stroke/TIA Tissue Perfusion: Goal: Complications of ischemic stroke/TIA will be minimized Outcome: Progressing   

## 2022-05-22 NOTE — Progress Notes (Signed)
Occupational Therapy Treatment Patient Details Name: Bailey [name] MRN: [id number] DOB: [birth year] Today's Date: 05/22/2022   History of present illness The pt is a 66 yo female presenting 8/25 with R lateral lean, perseverative speech, and unable to follow commands. CTA revealed L MCA infarct. Pt underwent endovascular revascularization of occluded left internal carotid artery artery and left middle cerebral artery M1 8/26. Intubated 8/25 and self extubated 8/26. PMH includes: HTN, HLD, DM II, and tobacco use.   OT comments  Pt progressing towards established OT goals. Pt continues to present with decreased attention to R, RUE movement, balance, cognition, and strength. Pt performing sit<>stand with stedy and Mod A +2. Transitioning to sink and pt washing her face with Max hand over hand. Pt tolerating 3 minutes in semi standing on stedy. Then pt with possible syncopal episode which resolved immediately upon returning to supine in bed. BP was stable back in bed but suspect orthostatic hypotension; RN present throughout. Continue to recommend dc to AIR and will continue to follow acutely as admitted.    Recommendations for follow up therapy are one component of a multi-disciplinary discharge planning process, led by the attending physician.  Recommendations may be updated based on patient status, additional functional criteria and insurance authorization.    Follow Up Recommendations  Acute inpatient rehab (3hours/day)    Assistance Recommended at Discharge Frequent or constant Supervision/Assistance  Patient can return home with the following  A lot of help with walking and/or transfers;A lot of help with bathing/dressing/bathroom;Assistance with cooking/housework;Direct supervision/assist for medications management;Assistance with feeding;Direct supervision/assist for financial management;Assist for transportation;Help with stairs or ramp for entrance   Equipment Recommendations  Other  (comment)    Recommendations for Other Services Rehab consult    Precautions / Restrictions Precautions Precautions: Fall;Other (comment) Precaution Comments: R hemiparesis, R inattention, cortrak, L mitten, watch BP Restrictions Weight Bearing Restrictions: No       Mobility Bed Mobility Overal bed mobility: Needs Assistance Bed Mobility: Supine to Sit Rolling: Max assist Sidelying to sit: +2 for physical assistance, +2 for safety/equipment, HOB elevated, Max assist Supine to sit: Mod assist, +2 for physical assistance Sit to supine: Total assist, +2 for physical assistance   General bed mobility comments: Sat on R side of bed with assist for bil LE but then good effort/core activation from pt to lift trunk with assist.  Total x 2 back to bed due to syncope    Transfers Overall transfer level: Needs assistance Equipment used: Ambulation equipment used Transfers: Sit to/from Stand Sit to Stand: Mod assist, +2 physical assistance Stand pivot transfers: Total assist, +2 physical assistance, +2 safety/equipment         General transfer comment: Stood in STEDY: assistance to get L hand on grab bar and then gait belt/bed pad to assist to stand; good effort from pt.     Balance Overall balance assessment: Needs assistance Sitting-balance support: Feet supported, Single extremity supported Sitting balance-Leahy Scale: Poor Sitting balance - Comments: Required UE support, but able to sit EOB with close guarding Postural control: Right lateral lean, Posterior lean Standing balance support: Bilateral upper extremity supported Standing balance-Leahy Scale: Poor Standing balance comment: L hand on grab bar of STEDY, therapist placed R hand on bar, standing in STEDY with min A of 2 once up                           ADL either performed or assessed with clinical  judgement   ADL Overall ADL's : Needs assistance/impaired     Grooming: Maximal assistance;Wash/dry  face Grooming Details (indicate cue type and reason): Max hand over hand to bring wash cloth to face                               General ADL Comments: total A for all aspects of care. Attempting to wash face at sink, but difficulty with decreased attention and following commands    Extremity/Trunk Assessment Upper Extremity Assessment Upper Extremity Assessment: RUE deficits/detail;LUE deficits/detail RUE Deficits / Details: no active movement noted. Able to move through full PROM RUE Sensation: decreased light touch;decreased proprioception RUE Coordination: decreased fine motor;decreased gross motor LUE Deficits / Details: moving against gravity throguhout full range - difficult to fully assess due to impiared communication/cognition   Lower Extremity Assessment Lower Extremity Assessment: Defer to PT evaluation RLE Deficits / Details: no active movement noted. Able to move through full PROM. No reaction to noxious stimuli.        Vision   Vision Assessment?: Vision impaired- to be further tested in functional context   Perception Perception Perception: Not tested   Praxis Praxis Praxis: Not tested    Cognition Arousal/Alertness: Awake/alert Behavior During Therapy: Flat affect Overall Cognitive Status: Difficult to assess Area of Impairment: Attention, Memory, Following commands, Safety/judgement, Awareness, Problem solving                   Current Attention Level: Focused Memory: Decreased short-term memory Following Commands: Follows one step commands inconsistently (needed multimodal cues) Safety/Judgement: Decreased awareness of safety, Decreased awareness of deficits Awareness: Intellectual Problem Solving: Slow processing, Decreased initiation, Difficulty sequencing, Requires verbal cues, Requires tactile cues General Comments: Pt with aphasia.  Required multimodal cues for any task.  Significant R inattention/neglect        Exercises       Shoulder Instructions       General Comments In bed and at EOB - worked on having pt try to look midline with vocal stimuli, clapping, and tactile cues.  Pt able to come midline at times but not to right.  Pt tolerated standing well. Transitioned in STEDY to at sink/in front of mirror.  Attempted working on looking into mirror to find midline but after at least 3 mins in STEDY pt had syncopal episode and had to be transitioned back to bed. Pt alert once supine.  Noted diaphoresis.  BP 137/59 in supine.  Suspect orthostatic hypotension. RN present during episode.    Pertinent Vitals/ Pain       Pain Assessment Pain Assessment: No/denies pain Faces Pain Scale: No hurt Pain Location: no indicatio of pain throughout session Pain Intervention(s): Monitored during session  Home Living                                          Prior Functioning/Environment              Frequency  Min 2X/week        Progress Toward Goals  OT Goals(current goals can now be found in the care plan section)  Progress towards OT goals: Progressing toward goals  Acute Rehab OT Goals OT Goal Formulation: Patient unable to participate in goal setting Time For Goal Achievement: 05/31/22 Potential to Achieve Goals: Good  Plan Discharge plan  remains appropriate    Co-evaluation    PT/OT/SLP Co-Evaluation/Treatment: Yes Reason for Co-Treatment: Complexity of the patient's impairments (multi-system involvement);For patient/therapist safety PT goals addressed during session: Mobility/safety with mobility OT goals addressed during session: ADL's and self-care      AM-PAC OT "6 Clicks" Daily Activity     Outcome Measure   Help from another person eating meals?: Total Help from another person taking care of personal grooming?: Total Help from another person toileting, which includes using toliet, bedpan, or urinal?: Total Help from another person bathing (including washing,  rinsing, drying)?: Total Help from another person to put on and taking off regular upper body clothing?: Total Help from another person to put on and taking off regular lower body clothing?: Total 6 Click Score: 6    End of Session Equipment Utilized During Treatment: Gait belt  OT Visit Diagnosis: Unsteadiness on feet (R26.81);Other abnormalities of gait and mobility (R26.89);Muscle weakness (generalized) (M62.81);Cognitive communication deficit (R41.841);Hemiplegia and hemiparesis Symptoms and signs involving cognitive functions: Cerebral infarction Hemiplegia - Right/Left: Right Hemiplegia - caused by: Cerebral infarction   Activity Tolerance Patient tolerated treatment well   Patient Left in bed;with call bell/phone within reach;with bed alarm set;with nursing/sitter in room   Nurse Communication Mobility status        Time: 6004-5997 OT Time Calculation (min): 28 min  Charges: OT General Charges $OT Visit: 1 Visit OT Treatments $Self Care/Home Management : 8-22 mins  Jonluke Cobbins MSOT, OTR/L Acute Rehab Office: [place] 05/22/2022, 2:21 PM

## 2022-05-22 NOTE — Progress Notes (Signed)
Physical Therapy Treatment Patient Details Name: Bailey [name] MRN: [id number] DOB: [birth year] Today's Date: 05/22/2022   History of Present Illness The pt is a 66 yo female presenting 8/25 with R lateral lean, perseverative speech, and unable to follow commands. CTA revealed L MCA infarct. Pt underwent endovascular revascularization of occluded left internal carotid artery artery and left middle cerebral artery M1 8/26. Intubated 8/25 and self extubated 8/26. PMH includes: HTN, HLD, DM II, and tobacco use.    PT Comments    Pt with good progress until syncopal episode (suspect orthostatic hypotension).  Pt was demonstrating improved transfer to EOB with good trunk activation and improved standing into STEDY.  She tolerated sitting EOB for several mins and in STEDY for at least 3 mins prior to syncopal episode that resovled immediately upon return to supine.  BP was stable back in bed but suspect orthostatic hypotension.  Still with significant R neglect and unable to cross midline with vision.     Recommendations for follow up therapy are one component of a multi-disciplinary discharge planning process, led by the attending physician.  Recommendations may be updated based on patient status, additional functional criteria and insurance authorization.  Follow Up Recommendations  Acute inpatient rehab (3hours/day)     Assistance Recommended at Discharge Frequent or constant Supervision/Assistance  Patient can return home with the following Two people to help with walking and/or transfers;Two people to help with bathing/dressing/bathroom;Assistance with cooking/housework;Assist for transportation;Help with stairs or ramp for entrance;Direct supervision/assist for medications management;Direct supervision/assist for financial management;Assistance with feeding   Equipment Recommendations  Other (comment) (defer to post acute)    Recommendations for Other Services       Precautions /  Restrictions Precautions Precautions: Fall;Other (comment) Precaution Comments: R hemiparesis, R inattention, cortrak, L mitten, watch BP     Mobility  Bed Mobility Overal bed mobility: Needs Assistance Bed Mobility: Supine to Sit     Supine to sit: Mod assist, +2 for physical assistance Sit to supine: Total assist, +2 for physical assistance   General bed mobility comments: Sat on R side of bed with assist for bil LE but then good effort/core activation from pt to lift trunk with assist.  Total x 2 back to bed due to syncope    Transfers Overall transfer level: Needs assistance Equipment used: Ambulation equipment used Transfers: Sit to/from Stand Sit to Stand: Mod assist, +2 physical assistance           General transfer comment: Stood in STEDY: assistance to get L hand on grab bar and then gait belt/bed pad to assist to stand; good effort from pt.    Ambulation/Gait               General Gait Details: unable   Stairs             Wheelchair Mobility    Modified Rankin (Stroke Patients Only) Modified Rankin (Stroke Patients Only) Pre-Morbid Rankin Score: No symptoms Modified Rankin: Severe disability     Balance Overall balance assessment: Needs assistance Sitting-balance support: Feet supported, Single extremity supported Sitting balance-Leahy Scale: Poor Sitting balance - Comments: Required UE support, but able to sit EOB with close guarding   Standing balance support: Bilateral upper extremity supported Standing balance-Leahy Scale: Poor Standing balance comment: L hand on grab bar of STEDY, therapist placed R hand on bar, standing in STEDY with min A of 2 once up  Cognition Arousal/Alertness: Awake/alert Behavior During Therapy: Flat affect Overall Cognitive Status: Difficult to assess Area of Impairment: Attention, Memory, Following commands, Safety/judgement, Awareness, Problem solving                    Current Attention Level: Focused Memory: Decreased short-term memory Following Commands: Follows one step commands inconsistently (needed multimodal cues) Safety/Judgement: Decreased awareness of safety, Decreased awareness of deficits Awareness: Intellectual Problem Solving: Slow processing, Decreased initiation, Difficulty sequencing, Requires verbal cues, Requires tactile cues General Comments: Pt with aphasia.  Required multimodal cues for any task.  Significant R inattention/neglect        Exercises Other Exercises Other Exercises: PROM R LE x 5 -flaccid and no contractions noted    General Comments General comments (skin integrity, edema, etc.): In bed and at EOB - worked on having pt try to look midline with vocal stimuli, clapping, and tactile cues.  Pt able to come midline at times but not to right.  Pt tolerated standing well. Transitioned in STEDY to at sink/in front of mirror.  Attempted working on looking into mirror to find midline but after at least 3 mins in STEDY pt had syncopal episode and had to be transitioned back to bed. Pt alert once supine.  Noted diaphoresis.  BP 137/59 in supine.  Suspect orthostatic hypotension. RN present during episode.      Pertinent Vitals/Pain Pain Assessment Pain Assessment: No/denies pain    Home Living                          Prior Function            PT Goals (current goals can now be found in the care plan section) Progress towards PT goals: Progressing toward goals    Frequency    Min 4X/week      PT Plan Current plan remains appropriate    Co-evaluation PT/OT/SLP Co-Evaluation/Treatment: Yes Reason for Co-Treatment: Complexity of the patient's impairments (multi-system involvement);For patient/therapist safety PT goals addressed during session: Mobility/safety with mobility OT goals addressed during session: ADL's and self-care      AM-PAC PT "6 Clicks" Mobility   Outcome Measure   Help needed turning from your back to your side while in a flat bed without using bedrails?: A Lot Help needed moving from lying on your back to sitting on the side of a flat bed without using bedrails?: Total Help needed moving to and from a bed to a chair (including a wheelchair)?: Total Help needed standing up from a chair using your arms (e.g., wheelchair or bedside chair)?: Total Help needed to walk in hospital room?: Total Help needed climbing 3-5 steps with a railing? : Total 6 Click Score: 7    End of Session Equipment Utilized During Treatment: Gait belt Activity Tolerance: Treatment limited secondary to medical complications (Comment) (suspect orthostatic) Patient left: with call bell/phone within reach;in bed;with bed alarm set Nurse Communication: Mobility status;Need for lift equipment (syncopal / orthostatic) PT Visit Diagnosis: Other abnormalities of gait and mobility (R26.89);Hemiplegia and hemiparesis;Unsteadiness on feet (R26.81);Muscle weakness (generalized) (M62.81);Difficulty in walking, not elsewhere classified (R26.2);Other symptoms and signs involving the nervous system (R29.898) Hemiplegia - Right/Left: Right Hemiplegia - dominant/non-dominant: Dominant Hemiplegia - caused by: Cerebral infarction     Time: 4098-1191 PT Time Calculation (min) (ACUTE ONLY): 25 min  Charges:  $Neuromuscular Re-education: 8-22 mins  Abran Richard, PT Acute Rehab [facility] [place] 05/22/2022, 1:31 PM

## 2022-05-23 DIAGNOSIS — R1312 Dysphagia, oropharyngeal phase: Secondary | ICD-10-CM

## 2022-05-23 DIAGNOSIS — I63232 Cerebral infarction due to unspecified occlusion or stenosis of left carotid arteries: Secondary | ICD-10-CM | POA: Diagnosis not present

## 2022-05-23 DIAGNOSIS — I6523 Occlusion and stenosis of bilateral carotid arteries: Secondary | ICD-10-CM

## 2022-05-23 LAB — CBC
HCT: 34.6 % — ABNORMAL LOW (ref 36.0–46.0)
Hemoglobin: 10.7 g/dL — ABNORMAL LOW (ref 12.0–15.0)
MCH: 21.7 pg — ABNORMAL LOW (ref 26.0–34.0)
MCHC: 30.9 g/dL (ref 30.0–36.0)
MCV: 70.3 fL — ABNORMAL LOW (ref 80.0–100.0)
Platelets: 366 10*3/uL (ref 150–400)
RBC: 4.92 MIL/uL (ref 3.87–5.11)
RDW: 18 % — ABNORMAL HIGH (ref 11.5–15.5)
WBC: 13.3 10*3/uL — ABNORMAL HIGH (ref 4.0–10.5)
nRBC: 0 % (ref 0.0–0.2)

## 2022-05-23 LAB — GLUCOSE, CAPILLARY
Glucose-Capillary: 154 mg/dL — ABNORMAL HIGH (ref 70–99)
Glucose-Capillary: 213 mg/dL — ABNORMAL HIGH (ref 70–99)
Glucose-Capillary: 217 mg/dL — ABNORMAL HIGH (ref 70–99)
Glucose-Capillary: 232 mg/dL — ABNORMAL HIGH (ref 70–99)
Glucose-Capillary: 276 mg/dL — ABNORMAL HIGH (ref 70–99)
Glucose-Capillary: 281 mg/dL — ABNORMAL HIGH (ref 70–99)
Glucose-Capillary: 340 mg/dL — ABNORMAL HIGH (ref 70–99)

## 2022-05-23 LAB — BASIC METABOLIC PANEL
Anion gap: 8 (ref 5–15)
BUN: 39 mg/dL — ABNORMAL HIGH (ref 8–23)
CO2: 23 mmol/L (ref 22–32)
Calcium: 8.7 mg/dL — ABNORMAL LOW (ref 8.9–10.3)
Chloride: 106 mmol/L (ref 98–111)
Creatinine, Ser: 0.72 mg/dL (ref 0.44–1.00)
GFR, Estimated: >60 mL/min (ref >60)
Glucose, Bld: 261 mg/dL — ABNORMAL HIGH (ref 70–99)
Potassium: 4.9 mmol/L (ref 3.5–5.1)
Sodium: 137 mmol/L (ref 135–145)

## 2022-05-23 NOTE — Progress Notes (Addendum)
STROKE TEAM PROGRESS NOTE   INTERVAL HISTORY Patient is seen in her room with no family at the bedside. She remains hemodynamically stable and her neurological exam is unchanged.  Per nursing, she has been eating most of her meals.  Will cycle tube feedings and hopefully be able to remove Cortrak soon.  Vitals:   05/23/22 0422 05/23/22 0726 05/23/22 1145 05/23/22 1148  BP: (!) 154/61 136/60 (!) 96/52 (!) 110/51  Pulse: 66 98 100   Resp: '16 20 16   '$ Temp: 98.1 F (36.7 C) 97.9 F (36.6 C) 98.4 F (36.9 C)   TempSrc: Oral Oral Oral   SpO2: 96% 99% 99%   Weight: 88.2 kg     Height:       CBC:  Recent Labs  Lab 05/22/22 0613 05/23/22 0941  WBC 13.8* 13.3*  HGB 10.9* 10.7*  HCT 34.7* 34.6*  MCV 68.6* 70.3*  PLT 306 034    Basic Metabolic Panel:  Recent Labs  Lab 05/18/22 0241 05/18/22 1210 05/22/22 0613 05/23/22 0941  NA  --    < > 137 137  K 4.1   < > 4.5 4.9  CL  --    < > 103 106  CO2  --    < > 25 23  GLUCOSE  --    < > 238* 261*  BUN  --    < > 33* 39*  CREATININE  --    < > 0.66 0.72  CALCIUM  --    < > 9.0 8.7*  MG 2.1  --   --   --   PHOS 2.5  --   --   --    < > = values in this interval not displayed.    Lipid Panel:  Recent Labs  Lab 05/17/22 0725  TRIG 79    HgbA1c:  No results for input(s): "HGBA1C" in the last 168 hours.  Urine Drug Screen:  No results for input(s): "LABOPIA", "COCAINSCRNUR", "LABBENZ", "AMPHETMU", "THCU", "LABBARB" in the last 168 hours.   Alcohol Level  No results for input(s): "ETH" in the last 168 hours.   IMAGING past 24 hours No results found.  PHYSICAL EXAM  Physical Exam  Constitutional: Obese middle-age African-American lady who is not in distress Respiratory: Effort normal, non-labored breathing  Neuro: Mental Status: Eyes open.  Globally aphasic with left gaze deviation, does not track but will occasionally follow simple midline commands to pantomime Cranial Nerves: II: PERRL, 73m reactive III,IV,  VI: No ptosis or diploplia. Left gaze deviation, does not cross midline. Does consistently track XI: Head turned to the left Motor: Tone is normal. Bulk is normal. Spontaneous movement noted on the left.  Trace movement on the right with flaccid hemiplegia, will move RLE to noxious stimuli.  Will not move RUE     ASSESSMENT/PLAN Ms. Bailey [name] a 66y.o. female with history of HTN, hyperlipedmia, DM2, tobacco abuse presenting with waxing and waning stroke symptoms.  She last talked to family several days before admission.  Symptom discovery was at 2 PM on 8/26. Initial head CT was read with no acute intracranial process. Her initial examination for ED provider was notable for fluctuating right-sided weakness.  She was making repetitive statements ("I can't talk") and not following commands. She was taken to IR emergently for a mechanical thrombectomy and stent assisted angioplasty of the left ICA. Remained intubated post procedure. MRI and MRA show reocclusion of stent. Extubated 8/27.   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 Rescue left proximal ICA stent which was occluded, etiology likely due to large vessel disease Code Stroke CT head No acute abnormality. Small vessel disease. Atrophy. ASPECTS 10.    CTA head & neck - The posterior left M2 segment is occluded with poor collaterals. 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 High-grade stenosis or occlusion of the left P2 segment.  CT perfusion Core 583m, Perfusion 1267m Mismatch 61 MRI-  acute infarct involving the posterior left MCA territory and posterior left insula. Additional punctate foci of acute nonhemorrhagic infarct are present along the watershed distribution. [id number] nonhemorrhagic infarct in the  medial left thalamus MRA  No flow in the left ICA. Mod stenosis of the left P2  2D Echo EF 65-70% LDL 25 HgbA1c 7.2 UDS negative VTE prophylaxis - lovenox No antithrombotic prior to admission, now on aspirin 81 mg daily and Brilinta (ticagrelor) 90 mg bid.  Therapy recommendations: CIR vs. SNF disposition:  pending  Carotid stenosis CTA head & neck - High-grade stenosis or occlusion of the left internal carotid artery at the bifurcation. Narrowing of less than 50% at the origin of the left common carotid artery. Calcified and noncalcified plaque bifurcation results in narrowing of the proximal right ICA. The lumen is narrowed to 1.5 mm. This compares with a more normal distal segment of 4 mm just below the skull base. Resulting 60% stenosis  Hypertension Home meds:  lisinopril Stable Now on lisinopril 40 and amlodipine 5 BP <180/105 Off Cleviprex Long-term BP goal 1 30-1 50 given left ICA occlusion  Hyperlipidemia Home meds:  Atorvastatin '40mg'$  LDL 25, goal < 70 Lipitor 40 resumed Continue statin at discharge  Diabetes type II Uncontrolled Home meds:  Metformin  HgbA1c 7.2, goal < 7.0 CBGs SSI- increasing coverage per diabetic coordinator recommendations 0-15u and 10u q4 for TF coverage 16u insulin glargine daily  Tobacco abuse Current smoker Smoking cessation counseling will be provided  Other Stroke Risk Factors Advanced Age >/= 65 58besity, Body mass index is 32.36 kg/m., BMI >/= 30 associated with increased stroke risk, recommend weight loss, diet and exercise as appropriate   Other Active Problems Leukocytosis WBC 13.8-> 13.3 Dysphagia SLP consult placed Now on diet Cortrak in place, will cycle tube feedings and assess patient's PO intake  Hospital day # 8  [place]ona[place]N, AGACNP-BC [facility] See Amion for schedule and pager information 05/23/2022 12:01 PM   ATTENDING NOTE: I reviewed above note and agree with the assessment  and plan. Pt was seen and examined.   [age]ar old female with history of hypertension, hyperlipidemia, diabetes, smoker admitted for right-sided weakness, aphasia, perseveration.  CT showed left MCA hyperdense sign.  CTA head and neck showed left ICA occlusion left M2 and left P2 occlusion.  Right ICA 50 to 60% stenosis. CTP 62/123.  Status post IR with TICI2c and rescue left ICA stenting but was occluded.  MRI showed left MCA, ACA and MCA/PCA infarct.  MRI showed left ICA still occluded.  EF 65 to 70%.  LDL 25, A1c 7.0, UDS negative.  Creatinine 0.66, WBC 13.8->13.3.  On exam, two female family members and RN are at the bedside. RN is feeing pt lunch and stated that pt ate dinner 75%  last night, and 50% breakfast this am. Pt awake with eyes open, global aphasia, not following simple commands. Not able to name and repeat. Left gaze and not cross midline, tracking on the left but not on the right, blinking to visual threat on the left but not on the right. Right facial droop. Tongue protrusion not cooperative. Spontaneous movement on the left, LUE against gravity. Right hemiplegia with RLE slight withdraw to pain. Sensation, coordination and gait not tested.  Etiology for patient stroke likely due to large vessel disease from left ICA/CCA system given uncontrolled risk factors and intracranial/extracranial atherosclerosis.  Currently on aspirin 81 and Brilinta.  Resumed home Lipitor 40.  Aggressive risk factor education.  Dysphagia but able to eat at least 50% of meal, hopefully core track will be soon stopped.  BP goal 1 30-1 50 given left ICA still occluded.  PT/OT recommend CIR versus SNF.  For detailed assessment and plan, please refer to above/below as I have made changes wherever appropriate.   Rosalin Hawking, MD PhD Stroke Neurology 05/23/2022 2:25 PM    To contact Stroke Continuity provider, please refer to [email or web address] After hours, contact General Neurology

## 2022-05-24 ENCOUNTER — Inpatient Hospital Stay (HOSPITAL_COMMUNITY): Payer: Medicare HMO

## 2022-05-24 DIAGNOSIS — D72829 Elevated white blood cell count, unspecified: Secondary | ICD-10-CM | POA: Diagnosis not present

## 2022-05-24 DIAGNOSIS — N390 Urinary tract infection, site not specified: Secondary | ICD-10-CM

## 2022-05-24 DIAGNOSIS — I6602 Occlusion and stenosis of left middle cerebral artery: Secondary | ICD-10-CM

## 2022-05-24 DIAGNOSIS — I63232 Cerebral infarction due to unspecified occlusion or stenosis of left carotid arteries: Secondary | ICD-10-CM | POA: Diagnosis not present

## 2022-05-24 LAB — CBC
HCT: 33.3 % — ABNORMAL LOW (ref 36.0–46.0)
Hemoglobin: 10.6 g/dL — ABNORMAL LOW (ref 12.0–15.0)
MCH: 21.9 pg — ABNORMAL LOW (ref 26.0–34.0)
MCHC: 31.8 g/dL (ref 30.0–36.0)
MCV: 68.8 fL — ABNORMAL LOW (ref 80.0–100.0)
Platelets: 360 10*3/uL (ref 150–400)
RBC: 4.84 MIL/uL (ref 3.87–5.11)
RDW: 17.8 % — ABNORMAL HIGH (ref 11.5–15.5)
WBC: 14.4 10*3/uL — ABNORMAL HIGH (ref 4.0–10.5)
nRBC: 0 % (ref 0.0–0.2)

## 2022-05-24 LAB — BASIC METABOLIC PANEL
Anion gap: 9 (ref 5–15)
BUN: 42 mg/dL — ABNORMAL HIGH (ref 8–23)
CO2: 24 mmol/L (ref 22–32)
Calcium: 9.1 mg/dL (ref 8.9–10.3)
Chloride: 105 mmol/L (ref 98–111)
Creatinine, Ser: 0.67 mg/dL (ref 0.44–1.00)
GFR, Estimated: >60 mL/min (ref >60)
Glucose, Bld: 208 mg/dL — ABNORMAL HIGH (ref 70–99)
Potassium: 5.1 mmol/L (ref 3.5–5.1)
Sodium: 138 mmol/L (ref 135–145)

## 2022-05-24 LAB — URINALYSIS, ROUTINE W REFLEX MICROSCOPIC
Bilirubin Urine: NEGATIVE
Glucose, UA: 50 mg/dL — AB
Ketones, ur: NEGATIVE mg/dL
Nitrite: NEGATIVE
Protein, ur: NEGATIVE mg/dL
Specific Gravity, Urine: 1.019 (ref 1.005–1.030)
WBC, UA: >50 WBC/hpf — ABNORMAL HIGH (ref 0–5)
pH: 6 (ref 5.0–8.0)

## 2022-05-24 LAB — GLUCOSE, CAPILLARY
Glucose-Capillary: 211 mg/dL — ABNORMAL HIGH (ref 70–99)
Glucose-Capillary: 202 mg/dL — ABNORMAL HIGH (ref 70–99)
Glucose-Capillary: 323 mg/dL — ABNORMAL HIGH (ref 70–99)
Glucose-Capillary: 385 mg/dL — ABNORMAL HIGH (ref 70–99)

## 2022-05-24 MED ORDER — SODIUM CHLORIDE 0.9 % IV SOLN
1.0000 g | INTRAVENOUS | Status: DC
  Administered 2022-05-24 – 2022-05-28 (×5): 1 g via INTRAVENOUS
  Filled 2022-05-24 (×5): qty 10

## 2022-05-24 MED ORDER — GLUCERNA 1.5 CAL PO LIQD
600.0000 mL | ORAL | Status: DC
  Administered 2022-05-25: 600 mL
  Filled 2022-05-24 (×2): qty 1000
  Filled 2022-05-24: qty 711

## 2022-05-24 NOTE — Progress Notes (Addendum)
STROKE TEAM PROGRESS NOTE   INTERVAL HISTORY Patient is seen in her room with no family at the bedside. She remains hemodynamically stable and her neurological exam is unchanged.  Calorie count started as she has had good PO intake.  Leukocytosis persists and UA was positive for UTI.  Will treat with Rocephin.  Vitals:   05/24/22 0415 05/24/22 0420 05/24/22 0518 05/24/22 0742  BP:  (!) 140/54 (!) 140/54 (!) 122/53  Pulse:  (!) 105 (!) 102 (!) 101  Resp:  '14 14 15  '$ Temp:  98.2 F (36.8 C) 98.2 F (36.8 C) 98.6 F (37 C)  TempSrc:  Oral Oral Oral  SpO2:  97% 98% 100%  Weight: 88 kg     Height:       CBC:  Recent Labs  Lab 05/23/22 0941 05/24/22 0410  WBC 13.3* 14.4*  HGB 10.7* 10.6*  HCT 34.6* 33.3*  MCV 70.3* 68.8*  PLT 366 932    Basic Metabolic Panel:  Recent Labs  Lab 05/18/22 0241 05/18/22 1210 05/23/22 0941 05/24/22 0410  NA  --    < > 137 138  K 4.1   < > 4.9 5.1  CL  --    < > 106 105  CO2  --    < > 23 24  GLUCOSE  --    < > 261* 208*  BUN  --    < > 39* 42*  CREATININE  --    < > 0.72 0.67  CALCIUM  --    < > 8.7* 9.1  MG 2.1  --   --   --   PHOS 2.5  --   --   --    < > = values in this interval not displayed.    Lipid Panel:  No results for input(s): "CHOL", "TRIG", "HDL", "CHOLHDL", "VLDL", "LDLCALC" in the last 168 hours.  HgbA1c:  No results for input(s): "HGBA1C" in the last 168 hours.  Urine Drug Screen:  No results for input(s): "LABOPIA", "COCAINSCRNUR", "LABBENZ", "AMPHETMU", "THCU", "LABBARB" in the last 168 hours.   Alcohol Level  No results for input(s): "ETH" in the last 168 hours.   IMAGING past 24 hours DG CHEST PORT 1 VIEW  Result Date: 05/24/2022 CLINICAL DATA:  Leukocytosis EXAM: PORTABLE CHEST 1 VIEW COMPARISON:  May 20, 2022 FINDINGS: A feeding tube terminates below today's film. No pneumothorax. The heart, hila, mediastinum, lungs, and pleura are unremarkable. IMPRESSION: No cause for leukocytosis identified. The  feeding tube terminates below today's film. Electronically Signed   By: Dorise Bullion III M.D.   On: 05/24/2022 10:28    PHYSICAL EXAM  Physical Exam  Constitutional: Obese middle-age African-American lady who is not in distress Respiratory: Effort normal, non-labored breathing  Neuro: Mental Status: Eyes open.  Globally aphasic with left gaze deviation, does not track but will occasionally follow simple midline commands to pantomime Cranial Nerves: II: PERRL, 48m reactive III,IV, VI: No ptosis or diploplia. Left gaze deviation, does not cross midline. Does consistently track XI: Head turned to the left Motor: Tone is normal. Bulk is normal. Spontaneous movement noted on the left.  Trace movement on the right with flaccid hemiplegia, will move RLE to noxious stimuli.  Will not move RUE     ASSESSMENT/PLAN Ms. SMoya Duanis a 66y.o. female with history of HTN, hyperlipedmia, DM2, tobacco abuse presenting with waxing and waning stroke symptoms.  She last talked to family several days before admission.  Symptom  discovery was at 2 PM on 8/26. Initial head CT was read with no acute intracranial process. Her initial examination for ED provider was notable for fluctuating right-sided weakness.  She was making repetitive statements ("I can't talk") and not following commands. She was taken to IR emergently for a mechanical thrombectomy and stent assisted angioplasty of the left ICA. Remained intubated post procedure. MRI and MRA show reocclusion of stent. Extubated 8/27.   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 Rescue left proximal ICA stent which was occluded, etiology likely due to large vessel disease Code Stroke CT head No acute abnormality. Small vessel disease. Atrophy. ASPECTS 10.    CTA head & neck - The posterior left M2 segment is occluded with poor collaterals. High-grade stenosis  or occlusion of the left internal carotid artery at the bifurcation with reconstitution in the left cavernous segment, likely from the ophthalmic artery. High-grade stenosis or occlusion of the left P2 segment.  CT perfusion Core 77m, Perfusion 1256m Mismatch 61 MRI-  acute infarct involving the posterior left MCA territory and posterior left insula. Additional punctate foci of acute nonhemorrhagic infarct are present along the watershed distribution. 11m16mcute nonhemorrhagic infarct in the medial left thalamus MRA  No flow in the left ICA. Mod stenosis of the left P2  2D Echo EF 65-70% LDL 25 HgbA1c 7.2 UDS negative VTE prophylaxis - lovenox No antithrombotic prior to admission, now on aspirin 81 mg daily and Brilinta (ticagrelor) 90 mg bid.  Therapy recommendations: CIR vs. SNF disposition:  pending  Carotid stenosis CTA head & neck - High-grade stenosis or occlusion of the left internal carotid artery at the bifurcation. Narrowing of less than 50% at the origin of the left common carotid artery. Calcified and noncalcified plaque bifurcation results in narrowing of the proximal right ICA. The lumen is narrowed to 1.5 mm. This compares with a more normal distal segment of 4 mm just below the skull base. Resulting 60% stenosis  Hypertension Home meds:  lisinopril Stable Now on lisinopril 40 and amlodipine 5 BP <180/105 Off Cleviprex Long-term BP goal 130-150 given left ICA occlusion  Hyperlipidemia Home meds:  Atorvastatin '40mg'$  LDL 25, goal < 70 Lipitor 40 resumed Continue statin at discharge  Diabetes type II Uncontrolled Home meds:  Metformin  HgbA1c 7.2, goal < 7.0 CBGs SSI- increasing coverage per diabetic coordinator recommendations 0-15u and 10u q4 for TF coverage 16u insulin glargine daily UTI Leukocytosis WBC 13.8-> 13.3-> 14.4 UA WBC > 50, urine culture pending CXR neg treat with Rocephin afebrile  Dysphagia SLP on board Now on diet with pure and thin  liquid Cortrak still in place, begin calorie count and assess patient's PO intake  Tobacco abuse Current smoker Smoking cessation counseling will be provided  Other Stroke Risk Factors Advanced Age >/= 65 66besity, Body mass index is 32.28 kg/m., BMI >/= 30 associated with increased stroke risk, recommend weight loss, diet and exercise as appropriate   Other Active Problems   Hospital day # 9  [place]N, AGACNP-BC [facility] See Amion for schedule and pager information 05/24/2022 11:26 AM   ATTENDING NOTE: I reviewed above note and agree with the assessment and plan. Pt was seen and examined.   No family at bedside.  Patient lying in bed, eyes open, awake alert, smiling to provider.  Still has global aphasia,  trying to make sound out.  Still has right plegia.  Per RN, she again ate breakfast well today.  We will check with dietitian and speech therapist to see whether we can remove core track.  Persistent leukocytosis, UA showed UTI, put on Rocephin.  CXR negative.  No fever.  PT/OT recommend CIR, however, patient probably needed SNF placement.  Continue aspirin and Brilinta as well as Lipitor.  For detailed assessment and plan, please refer to above/below as I have made changes wherever appropriate.   Bailey Hawking, MD PhD Stroke Neurology 05/24/2022 2:15 PM    To contact Stroke Continuity provider, please refer to [email or web address] After hours, contact General Neurology

## 2022-05-24 NOTE — Progress Notes (Signed)
Nutrition Follow-up  DOCUMENTATION CODES:   Not applicable  INTERVENTION:   -Initiate 48 hour calorie count per MD -Transition to nocturnal feedings:  Glucerna 1.5 @ 50 ml/hr via cortrak over 12 hour period  50 ml free water flush every 4 hours  Tube feeding regimen provides 900 kcal (53% of needs), 50 grams of protein, and 455 ml of H2O. Total free water: 755 ml daily    NUTRITION DIAGNOSIS:   Inadequate oral intake related to inability to eat as evidenced by NPO status.  Progressing; advanced to PO diet on 05/21/22  GOAL:   Patient will meet greater than or equal to 90% of their needs  Progressing  MONITOR:   TF tolerance  REASON FOR ASSESSMENT:   Consult Enteral/tube feeding initiation and management  ASSESSMENT:   Pt with PMH of HTN, HLD, DM, CODP, GERD, tobacco abuse admitted with L MCA stroke s/p mechanical thrombectomy of L ICA/MCA/ACA.  8/29 - cortrak placed (duodenal bulb) 8/31 - MBS, DYS 1, thin liquids  Reviewed I/O's: +108 ml x 24 hours and -1.2 L since admission  UOP: 1.4 L x 24 hours  Pt unavailable at time of visit. Attempted to speak with pt via call to hospital room phone, however, unable to reach.   Per chart review, intake has improved over the past few days. Noted meal completions 60-75%. MD requesting to cycle TF and start calorie count in hopes to d/c TF soon. Noted pt continues with hyperglycemia- RD will transition to nocturnal feedings due to improved oral intake.   9/2 Breakfast: 595 kcals, 26 grams protein Lunch: 534 kcals, 20 grams protein Dinner: 554 kcals, 25 grams protein  Total intake: 1683 kcal (99% of minimum estimated needs)  71 grams protein (84% of minimum estimated needs)  Medications reviewed and include colace and miralax.  Labs reviewed: CBGS: 202-211 (inpatient orders for glycemic control are 0-15 units insulin aspart every 4 hours, 10 units insulin aspart every 4 hours, and 16 units insulin glargine-yfgn daily).     Diet Order:   Diet Order             DIET - DYS 1 Room service appropriate? Yes; Fluid consistency: Thin  Diet effective now                   EDUCATION NEEDS:   No education needs have been identified at this time  Skin:  Skin Assessment: Reviewed RN Assessment  Last BM:  9/1 - type 5  Height:   Ht Readings from Last 1 Encounters:  05/16/22 '5\' 5"'$  (1.651 m)    Weight:   Wt Readings from Last 1 Encounters:  05/24/22 88 kg   BMI:  Body mass index is 32.28 kg/m.  Estimated Nutritional Needs:   Kcal:  1700-2000kcal/day  Protein:  85-100g/day  Fluid:  1.4-1.6L/day    Loistine Chance, RD, LDN, [place] Registered Dietitian II Certified Diabetes Care and Education Specialist Please refer to [facility] for RD and/or RD on-call/weekend/after hours pager

## 2022-05-25 DIAGNOSIS — N390 Urinary tract infection, site not specified: Secondary | ICD-10-CM | POA: Diagnosis not present

## 2022-05-25 DIAGNOSIS — F17200 Nicotine dependence, unspecified, uncomplicated: Secondary | ICD-10-CM | POA: Diagnosis not present

## 2022-05-25 DIAGNOSIS — D72829 Elevated white blood cell count, unspecified: Secondary | ICD-10-CM | POA: Diagnosis not present

## 2022-05-25 DIAGNOSIS — I63232 Cerebral infarction due to unspecified occlusion or stenosis of left carotid arteries: Secondary | ICD-10-CM | POA: Diagnosis not present

## 2022-05-25 LAB — BASIC METABOLIC PANEL
Anion gap: 10 (ref 5–15)
BUN: 42 mg/dL — ABNORMAL HIGH (ref 8–23)
CO2: 23 mmol/L (ref 22–32)
Calcium: 9.1 mg/dL (ref 8.9–10.3)
Chloride: 104 mmol/L (ref 98–111)
Creatinine, Ser: 0.66 mg/dL (ref 0.44–1.00)
GFR, Estimated: >60 mL/min (ref >60)
Glucose, Bld: 218 mg/dL — ABNORMAL HIGH (ref 70–99)
Potassium: 5.3 mmol/L — ABNORMAL HIGH (ref 3.5–5.1)
Sodium: 137 mmol/L (ref 135–145)

## 2022-05-25 LAB — CBC
HCT: 34 % — ABNORMAL LOW (ref 36.0–46.0)
Hemoglobin: 10.7 g/dL — ABNORMAL LOW (ref 12.0–15.0)
MCH: 21.8 pg — ABNORMAL LOW (ref 26.0–34.0)
MCHC: 31.5 g/dL (ref 30.0–36.0)
MCV: 69.2 fL — ABNORMAL LOW (ref 80.0–100.0)
Platelets: 361 10*3/uL (ref 150–400)
RBC: 4.91 MIL/uL (ref 3.87–5.11)
RDW: 17.7 % — ABNORMAL HIGH (ref 11.5–15.5)
WBC: 14.7 10*3/uL — ABNORMAL HIGH (ref 4.0–10.5)
nRBC: 0 % (ref 0.0–0.2)

## 2022-05-25 LAB — GLUCOSE, CAPILLARY
Glucose-Capillary: 220 mg/dL — ABNORMAL HIGH (ref 70–99)
Glucose-Capillary: 239 mg/dL — ABNORMAL HIGH (ref 70–99)
Glucose-Capillary: 251 mg/dL — ABNORMAL HIGH (ref 70–99)
Glucose-Capillary: 283 mg/dL — ABNORMAL HIGH (ref 70–99)
Glucose-Capillary: 286 mg/dL — ABNORMAL HIGH (ref 70–99)

## 2022-05-25 MED ORDER — ADULT MULTIVITAMIN W/MINERALS CH
1.0000 | ORAL_TABLET | Freq: Every day | ORAL | Status: DC
  Administered 2022-05-25 – 2022-05-28 (×4): 1 via ORAL
  Filled 2022-05-25 (×3): qty 1

## 2022-05-25 MED ORDER — INSULIN ASPART 100 UNIT/ML IJ SOLN
4.0000 [IU] | Freq: Three times a day (TID) | INTRAMUSCULAR | Status: DC
  Administered 2022-05-25 – 2022-05-28 (×10): 4 [IU] via SUBCUTANEOUS

## 2022-05-25 MED ORDER — INSULIN ASPART 100 UNIT/ML IJ SOLN
0.0000 [IU] | Freq: Three times a day (TID) | INTRAMUSCULAR | Status: DC
  Administered 2022-05-25 (×2): 8 [IU] via SUBCUTANEOUS
  Administered 2022-05-26: 15 [IU] via SUBCUTANEOUS
  Administered 2022-05-26: 8 [IU] via SUBCUTANEOUS
  Administered 2022-05-27: 5 [IU] via SUBCUTANEOUS
  Administered 2022-05-27: 3 [IU] via SUBCUTANEOUS
  Administered 2022-05-27: 8 [IU] via SUBCUTANEOUS
  Administered 2022-05-27 – 2022-05-28 (×2): 5 [IU] via SUBCUTANEOUS
  Administered 2022-05-28: 11 [IU] via SUBCUTANEOUS
  Administered 2022-05-28: 5 [IU] via SUBCUTANEOUS

## 2022-05-25 NOTE — Progress Notes (Signed)
Occupational Therapy Treatment Patient Details Name: Bailey [name] MRN: [id number] DOB: 1956-05-31 Today's Date: 05/25/2022   History of present illness 66 yo female presenting 8/25 with R lateral lean, perseverative speech, and unable to follow commands. CTA revealed L MCA infarct. Pt underwent endovascular revascularization of occluded left internal carotid artery artery and left middle cerebral artery M1 8/26. Intubated 8/25 and self extubated 8/26. PMH includes: HTN, HLD, DM II, and tobacco use.   OT comments  Patient continues to make incremental progress towards goals in skilled OT session. Patient's session encompassed sitting balance EOB, assessment of BP, visual tracking, and participation in ADLs. Patient continues to require significant assist for bed mobility, but noted to initiate movements with LLE to date. Patient with significant neglect for R side, and unable to visually track or reach for items past midline despite multiple attempts in session. Patient remains total A for all aspects of care. Recommendation downgraded to SNF to allow patient to progress in a less intensive manner. OT will continue to follow acutely.   Orthostatic BPs   Supine 123/48  Sitting 102/64  Sitting after 2 min 111/54        Recommendations for follow up therapy are one component of a multi-disciplinary discharge planning process, led by the attending physician.  Recommendations may be updated based on patient status, additional functional criteria and insurance authorization.    Follow Up Recommendations  Skilled nursing-short term rehab (<3 hours/day)    Assistance Recommended at Discharge Frequent or constant Supervision/Assistance  Patient can return home with the following  A lot of help with walking and/or transfers;A lot of help with bathing/dressing/bathroom;Assistance with cooking/housework;Direct supervision/assist for medications management;Assistance with feeding;Direct  supervision/assist for financial management;Assist for transportation;Help with stairs or ramp for entrance   Equipment Recommendations  Other (comment) (Defer to next venue)    Recommendations for Other Services      Precautions / Restrictions Precautions Precautions: Fall;Other (comment) Precaution Comments: R hemiparesis, R inattention, cortrak, L mitten, watch BP Restrictions Weight Bearing Restrictions: No       Mobility Bed Mobility Overal bed mobility: Needs Assistance Bed Mobility: Supine to Sit, Sit to Supine Rolling: Mod assist, Max assist Sidelying to sit: +2 for physical assistance, +2 for safety/equipment, HOB elevated, Max assist Supine to sit: Mod assist, +2 for physical assistance Sit to supine: Max assist, +2 for physical assistance   General bed mobility comments: with multimodal cueing, able to bring L LE off bed. Assist for R LE and UE management. Good initiation of trunk elevation but overall modA+2. Able to roll towards R with assist for initiation and completion of rolling. MaxA to roll towards L. Pericare performed at end of session    Transfers                   General transfer comment: deferred this date to work on sitting balance and ADLs     Balance Overall balance assessment: Needs assistance Sitting-balance support: Feet supported, Single extremity supported Sitting balance-Leahy Scale: Poor Sitting balance - Comments: requires UE support but requires mod-maxA to maintain sitting balance.                                   ADL either performed or assessed with clinical judgement   ADL Overall ADL's : Needs assistance/impaired     Grooming: Maximal assistance;Wash/dry face Grooming Details (indicate cue type and reason): Max hand  over hand to bring wash cloth to face                 Toilet Transfer: +2 for safety/equipment;+2 for physical assistance;Total assistance Toilet Transfer Details (indicate cue type  and reason): bed level Toileting- Clothing Manipulation and Hygiene: +2 for safety/equipment;+2 for physical assistance;Total assistance Toileting - Clothing Manipulation Details (indicate cue type and reason): rolling for peri -care, soiled in BM unaware       General ADL Comments: Remains total A for all aspects of care    Extremity/Trunk Assessment              Vision       Perception     Praxis      Cognition Arousal/Alertness: Awake/alert Behavior During Therapy: Flat affect Overall Cognitive Status: Difficult to assess Area of Impairment: Attention, Memory, Following commands, Safety/judgement, Awareness, Problem solving                   Current Attention Level: Focused Memory: Decreased short-term memory Following Commands: Follows one step commands inconsistently Safety/Judgement: Decreased awareness of safety, Decreased awareness of deficits Awareness: Intellectual Problem Solving: Slow processing, Decreased initiation, Difficulty sequencing, Requires verbal cues, Requires tactile cues General Comments: Pt with aphasia.  Required multimodal cues for any task.  Significant R inattention/neglect, would chuckle at PT/OTs attempt to joke with patient        Exercises      Shoulder Instructions       General Comments BP supine: 123/48, sitting: 102/64, sitting x 2 minutes 111/54.    Pertinent Vitals/ Pain       Pain Assessment Pain Assessment: Faces Faces Pain Scale: No hurt Pain Location: no indication of pain throughout session Pain Intervention(s): Monitored during session  Home Living                                          Prior Functioning/Environment              Frequency  Min 2X/week        Progress Toward Goals  OT Goals(current goals can now be found in the care plan section)  Progress towards OT goals: Progressing toward goals  Acute Rehab OT Goals Patient Stated Goal: unable to state OT Goal  Formulation: Patient unable to participate in goal setting Time For Goal Achievement: 05/31/22 Potential to Achieve Goals: Good  Plan Discharge plan needs to be updated    Co-evaluation      Reason for Co-Treatment: Necessary to address cognition/behavior during functional activity;For patient/therapist safety;Complexity of the patient's impairments (multi-system involvement);To address functional/ADL transfers PT goals addressed during session: Mobility/safety with mobility;Balance OT goals addressed during session: ADL's and self-care      AM-PAC OT "6 Clicks" Daily Activity     Outcome Measure   Help from another person eating meals?: Total Help from another person taking care of personal grooming?: Total Help from another person toileting, which includes using toliet, bedpan, or urinal?: Total Help from another person bathing (including washing, rinsing, drying)?: Total Help from another person to put on and taking off regular upper body clothing?: Total Help from another person to put on and taking off regular lower body clothing?: Total 6 Click Score: 6    End of Session    OT Visit Diagnosis: Unsteadiness on feet (R26.81);Other abnormalities of gait and mobility (R26.89);Muscle weakness (generalized) (  M62.81);Cognitive communication deficit (R41.841);Hemiplegia and hemiparesis Symptoms and signs involving cognitive functions: Cerebral infarction Hemiplegia - Right/Left: Right Hemiplegia - caused by: Cerebral infarction   Activity Tolerance Patient tolerated treatment well   Patient Left in bed;with call bell/phone within reach;with bed alarm set   Nurse Communication Mobility status        Time: 0093-8182 OT Time Calculation (min): 32 min  Charges: OT General Charges $OT Visit: 1 Visit OT Treatments $Self Care/Home Management : 8-22 mins  Corinne Ports E. Remon Quinto, OTR/L Acute Rehabilitation Services [place] 05/25/2022, 1:15 PM

## 2022-05-25 NOTE — TOC Progression Note (Signed)
Transition of Care [facility]) - Progression Note    Patient Details  Name: Bailey [name] MRN: [id number] Date of Birth: 05-01-1956  [facility] CM/SW [place], [place] Phone Number: 05/25/2022, 12:53 PM  Clinical Narrative:   CSW completed letter for guardianship and had MD sign. CSW spoke with patient's son over phone, he is working today but will be coming to the hospital when he gets off work. CSW discussed bed offers and the letter, placed at the bedside for son to get when he comes to the hospital later. CSW to follow.    Expected Discharge Plan: [place] Barriers to Discharge: Continued Medical Work up, Ship broker  Expected Discharge Plan and Services Expected Discharge Plan: [place] Choice: [place] arrangements for the past 2 months: Single Family Home                                       Social Determinants of Health (SDOH) Interventions    Readmission Risk Interventions     No data to display

## 2022-05-25 NOTE — Progress Notes (Signed)
Ng tube removed from patient. No adverse effects. Patient tolerated well

## 2022-05-25 NOTE — Progress Notes (Addendum)
Physical Therapy Treatment Patient Details Name: Bailey [name] MRN: [id number] DOB: 1956/01/26 Today's Date: 05/25/2022   History of Present Illness 66 yo female presenting 8/25 with R lateral lean, perseverative speech, and unable to follow commands. CTA revealed L MCA infarct. Pt underwent endovascular revascularization of occluded left internal carotid artery artery and left middle cerebral artery M1 8/26. Intubated 8/25 and self extubated 8/26. PMH includes: HTN, HLD, DM II, and tobacco use.    PT Comments    Assessed orthostatic vitals, see below. Worked on sitting balance, tolerance, and ADL tasks this session. Patient requiring mod-maxA+2 for bed mobility and mod-max to maintain sitting balance. Patient continues to demonstrate R neglect/inattention despite max multimodal cueing. Updated to SNF for discharge as family is pursuing this post acute.    Orthostatic BPs  Supine 123/48  Sitting 102/64  Sitting after 2 min 111/54      Recommendations for follow up therapy are one component of a multi-disciplinary discharge planning process, led by the attending physician.  Recommendations may be updated based on patient status, additional functional criteria and insurance authorization.  Follow Up Recommendations  Skilled nursing-short term rehab (<3 hours/day)     Assistance Recommended at Discharge Frequent or constant Supervision/Assistance  Patient can return home with the following Two people to help with walking and/or transfers;Two people to help with bathing/dressing/bathroom;Assistance with cooking/housework;Assist for transportation;Help with stairs or ramp for entrance;Direct supervision/assist for medications management;Direct supervision/assist for financial management;Assistance with feeding   Equipment Recommendations  Other (comment) (defer to post acute rehab)    Recommendations for Other Services       Precautions / Restrictions Precautions Precautions:  Fall;Other (comment) Precaution Comments: R hemiparesis, R inattention, cortrak, L mitten, watch BP Restrictions Weight Bearing Restrictions: No     Mobility  Bed Mobility Overal bed mobility: Needs Assistance Bed Mobility: Supine to Sit, Sit to Supine Rolling: Mod assist, Max assist   Supine to sit: Mod assist, +2 for physical assistance Sit to supine: Max assist, +2 for physical assistance   General bed mobility comments: with multimodal cueing, able to bring L LE off bed. Assist for R LE and UE management. Good initiation of trunk elevation but overall modA+2. Able to roll towards R with assist for initiation and completion of rolling. MaxA to roll towards L. Pericare performed at end of session    Transfers                   General transfer comment: deferred this date to work on sitting balance and ADLs    Ambulation/Gait                   Stairs             Wheelchair Mobility    Modified Rankin (Stroke Patients Only) Modified Rankin (Stroke Patients Only) Pre-Morbid Rankin Score: No symptoms Modified Rankin: Severe disability     Balance Overall balance assessment: Needs assistance Sitting-balance support: Feet supported, Single extremity supported Sitting balance-Leahy Scale: Poor Sitting balance - Comments: requires UE support but requires mod-maxA to maintain sitting balance.                                    Cognition Arousal/Alertness: Awake/alert Behavior During Therapy: Flat affect Overall Cognitive Status: Difficult to assess Area of Impairment: Attention, Memory, Following commands, Safety/judgement, Awareness, Problem solving  Current Attention Level: Focused Memory: Decreased short-term memory Following Commands: Follows one step commands inconsistently Safety/Judgement: Decreased awareness of safety, Decreased awareness of deficits Awareness: Intellectual Problem Solving: Slow  processing, Decreased initiation, Difficulty sequencing, Requires verbal cues, Requires tactile cues General Comments: Pt with aphasia.  Required multimodal cues for any task.  Significant R inattention/neglect        Exercises      General Comments General comments (skin integrity, edema, etc.): BP supine: 123/48, sitting: 102/64, sitting x 2 minutes 111/54.      Pertinent Vitals/Pain Pain Assessment Pain Assessment: Faces Faces Pain Scale: No hurt Pain Intervention(s): Monitored during session    Home Living                          Prior Function            PT Goals (current goals can now be found in the care plan section) Acute Rehab PT Goals PT Goal Formulation: With patient/family Time For Goal Achievement: 05/31/22 Potential to Achieve Goals: Good Progress towards PT goals: Progressing toward goals    Frequency    Min 3X/week      PT Plan Discharge plan needs to be updated;Frequency needs to be updated    Co-evaluation PT/OT/SLP Co-Evaluation/Treatment: Yes Reason for Co-Treatment: For patient/therapist safety;To address functional/ADL transfers PT goals addressed during session: Mobility/safety with mobility;Balance        AM-PAC PT "6 Clicks" Mobility   Outcome Measure  Help needed turning from your back to your side while in a flat bed without using bedrails?: A Lot Help needed moving from lying on your back to sitting on the side of a flat bed without using bedrails?: Total Help needed moving to and from a bed to a chair (including a wheelchair)?: Total Help needed standing up from a chair using your arms (e.g., wheelchair or bedside chair)?: Total Help needed to walk in hospital room?: Total Help needed climbing 3-5 steps with a railing? : Total 6 Click Score: 7    End of Session   Activity Tolerance: Patient tolerated treatment well Patient left: with call bell/phone within reach;in bed;with bed alarm set Nurse Communication:  Mobility status PT Visit Diagnosis: Other abnormalities of gait and mobility (R26.89);Hemiplegia and hemiparesis;Unsteadiness on feet (R26.81);Muscle weakness (generalized) (M62.81);Difficulty in walking, not elsewhere classified (R26.2);Other symptoms and signs involving the nervous system (R29.898) Hemiplegia - Right/Left: Right Hemiplegia - dominant/non-dominant: Dominant Hemiplegia - caused by: Cerebral infarction     Time: 1194-1740 PT Time Calculation (min) (ACUTE ONLY): 33 min  Charges:  $Therapeutic Activity: 8-22 mins                     Lou Irigoyen A. Gilford Rile PT, DPT Acute Rehabilitation Services Office [phone]    Linna Hoff 05/25/2022, 1:02 PM

## 2022-05-25 NOTE — Progress Notes (Addendum)
Nutrition Follow-up  DOCUMENTATION CODES:   Not applicable  INTERVENTION:   -D/c calorie count -D/c cortrak tube per discussion with MD and RN -D/c free water flushes -D/c Glucerna 1.5 -Continue dysphagia 1 diet with thin liquids -Glucerna Shake po BID, each supplement provides 220 kcal and 10 grams of protein  -Pt remains with hyperglycemia; sent secure chat to diabetes coordinator for recommendations/ adjustments insulin regimen as pt has transitioned off TF  NUTRITION DIAGNOSIS:   Inadequate oral intake related to inability to eat as evidenced by NPO status.  Progressing; advanced to PO diet on 8/31  GOAL:   Patient will meet greater than or equal to 90% of their needs  Progressing   MONITOR:   PO intake, Diet advancement, TF tolerance  REASON FOR ASSESSMENT:   Consult Enteral/tube feeding initiation and management  ASSESSMENT:   Pt with PMH of HTN, HLD, DM, CODP, GERD, tobacco abuse admitted with L MCA stroke s/p mechanical thrombectomy of L ICA/MCA/ACA.  8/29 - cortrak placed (duodenal bulb) 8/31 - MBS, DYS 1, thin liquids  Reviewed I/O's: +330 ml x 24 hours and -903 ml x 24 hours  UOP: 800 ml x 24 hours  9/2 Breakfast: 595 kcals, 26 grams protein Lunch: 534 kcals, 20 grams protein Dinner: 554 kcals, 25 grams protein   Total intake: 1683 kcal (99% of minimum estimated needs)  71 grams protein (84% of minimum estimated needs)  9/3 Nothing documented    9/4 Breakfast: 403 kcals, 17 grams protein  Pt sitting up in bed at time of visit. Pt opened her eyes when name was called, but responded "uh huh" to everything that was said.   Observed breakfast tray- pt consumed 100% of pancakes and sausage, 75% of ice tea, and 25% of applesauce. No meal completion or tickets documented from 05/24/22.   Pt continues to consistently meet needs PO and able to meet her needs without support of enteral nutrition. Case discussed with RN and MD; received orders to d/c  cortrak and tube feedings.   Pt remains with hyperglycemia; suspect d/c of TF will assist with this. Sent secure chat to diabetes coordinator to make further recommendations due to alterations of nutrition plan.    Labs reviewed: K: 5.3, CBGS: 245-809 (inpatient orders for glycemic control are 0-15 units insulin aspart every 4 hours, 10 units insulin aspart every 4 hours, and 16 units inuslin glargine-yfgn daily).    Diet Order:   Diet Order             DIET - DYS 1 Room service appropriate? Yes; Fluid consistency: Thin  Diet effective now                   EDUCATION NEEDS:   No education needs have been identified at this time  Skin:  Skin Assessment: Reviewed RN Assessment  Last BM:  05/24/22  Height:   Ht Readings from Last 1 Encounters:  05/16/22 '5\' 5"'$  (1.651 m)    Weight:   Wt Readings from Last 1 Encounters:  05/25/22 85 kg   BMI:  Body mass index is 31.18 kg/m.  Estimated Nutritional Needs:   Kcal:  1700-2000kcal/day  Protein:  85-100g/day  Fluid:  1.4-1.6L/day    Loistine Chance, RD, LDN, [name] Registered Dietitian II Certified Diabetes Care and Education Specialist Please refer to [facility] for RD and/or RD on-call/weekend/after hours pager

## 2022-05-25 NOTE — Progress Notes (Addendum)
STROKE TEAM PROGRESS NOTE   INTERVAL HISTORY  Patient sitting up in bed eating breakfast, still with coretrack tube. Breakfast tray at bedside with ~80% eaten. Will d/c feeding tube. No family at the bedside. Left gaze does not cross midline. She is aphasic, right arm flaccid, right leg with some withdrawal to painful stimuli  Vitals:   05/24/22 2013 05/25/22 0401 05/25/22 0500 05/25/22 0736  BP: (!) 127/52 (!) 129/47  (!) 125/56  Pulse: (!) 108 (!) 108  97  Resp: '18 16  17  '$ Temp: 98.6 F (37 C) 98.1 F (36.7 C)  97.6 F (36.4 C)  TempSrc: Oral   Oral  SpO2: 100% 98%  99%  Weight:   85 kg   Height:       CBC:  Recent Labs  Lab 05/24/22 0410 05/25/22 0348  WBC 14.4* 14.7*  HGB 10.6* 10.7*  HCT 33.3* 34.0*  MCV 68.8* 69.2*  PLT 360 798    Basic Metabolic Panel:  Recent Labs  Lab 05/24/22 0410 05/25/22 0348  NA 138 137  K 5.1 5.3*  CL 105 104  CO2 24 23  GLUCOSE 208* 218*  BUN 42* 42*  CREATININE 0.67 0.66  CALCIUM 9.1 9.1    Lipid Panel:  No results for input(s): "CHOL", "TRIG", "HDL", "CHOLHDL", "VLDL", "LDLCALC" in the last 168 hours.  HgbA1c:  No results for input(s): "HGBA1C" in the last 168 hours.  Urine Drug Screen:  No results for input(s): "LABOPIA", "COCAINSCRNUR", "LABBENZ", "AMPHETMU", "THCU", "LABBARB" in the last 168 hours.   Alcohol Level  No results for input(s): "ETH" in the last 168 hours.   IMAGING past 24 hours DG CHEST PORT 1 VIEW  Result Date: 05/24/2022 CLINICAL DATA:  Leukocytosis EXAM: PORTABLE CHEST 1 VIEW COMPARISON:  May 20, 2022 FINDINGS: A feeding tube terminates below today's film. No pneumothorax. The heart, hila, mediastinum, lungs, and pleura are unremarkable. IMPRESSION: No cause for leukocytosis identified. The feeding tube terminates below today's film. Electronically Signed   By: Dorise Bullion III M.D.   On: 05/24/2022 10:28    PHYSICAL EXAM  Physical Exam  Constitutional: Obese middle-age African-American lady  who is not in distress Respiratory: Effort normal, non-labored breathing  Neuro: Mental Status: Eyes open.  Globally aphasic with left gaze deviation, does not track  Cranial Nerves: II: PERRL, 57m reactive III,IV, VI: No ptosis or diploplia. Left gaze deviation, does not cross midline. Does not blink to threat on right  XI: Head turned to the left Motor: Tone is normal. Bulk is normal. Spontaneous movement noted on the left.  Trace movement on the right with flaccid hemiplegia, will move RLE to noxious stimuli.  Will not move RUE     ASSESSMENT/PLAN Ms. SZsofia Proutis a 66y.o. female with history of HTN, hyperlipedmia, DM2, tobacco abuse presenting with waxing and waning stroke symptoms.  She last talked to family several days before admission.  Symptom discovery was at 2 PM on 8/26. Initial head CT was read with no acute intracranial process. Her initial examination for ED provider was notable for fluctuating right-sided weakness.  She was making repetitive statements ("I can't talk") and not following commands. She was taken to IR emergently for a mechanical thrombectomy and stent assisted angioplasty of the left ICA. Remained intubated post procedure. MRI and MRA show reocclusion of stent. Extubated 8/27.   Stroke:  Left MCA, ACA and MCA/PCA infarcts due to left ICA, M2 and P2 occlusion s/p IR of occluded left  ICA, M2 and P2 with TICI2c. Rescue left proximal ICA stent which was occluded, etiology likely due to large vessel disease Code Stroke CT head No acute abnormality. Small vessel disease. Atrophy. ASPECTS 10.    CTA head & neck - The posterior left M2 segment is occluded with poor collaterals. 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 High-grade stenosis or occlusion of the left P2 segment.  CT perfusion Core 33m, Perfusion 1226m Mismatch  61 MRI-  acute infarct involving the posterior left MCA territory and posterior left insula. Additional punctate foci of acute nonhemorrhagic infarct are present along the watershed distribution. 8m64mcute nonhemorrhagic infarct in the medial left thalamus MRA  No flow in the left ICA. Mod stenosis of the left P2  2D Echo EF 65-70% LDL 25 HgbA1c 7.2 UDS negative VTE prophylaxis - lovenox No antithrombotic prior to admission, now on aspirin 81 mg daily and Brilinta (ticagrelor) 90 mg bid.  Therapy recommendations: SNF disposition:  pending  Carotid stenosis CTA head & neck - High-grade stenosis or occlusion of the left internal carotid artery at the bifurcation. Narrowing of less than 50% at the origin of the left common carotid artery. Calcified and noncalcified plaque bifurcation results in narrowing of the proximal right ICA. The lumen is narrowed to 1.5 mm. This compares with a more normal distal segment of 4 mm just below the skull base. Resulting 60% stenosis  Hypertension Home meds:  lisinopril Stable Now on lisinopril 40 and amlodipine 5 BP <180/105 Off Cleviprex Long-term BP goal 130-150 given left ICA occlusion  Hyperlipidemia Home meds:  Atorvastatin '40mg'$  LDL 25, goal < 70 Lipitor 40 resumed Continue statin at discharge  Diabetes type II Uncontrolled Home meds:  Metformin  HgbA1c 7.2, goal < 7.0 CBGs SSI On insulin  Close PCP follow up for better Dm control  UTI Leukocytosis WBC 13.8-> 13.3-> 14.4->14.7 UA WBC > 50, urine culture Staph Iugdunensus CXR neg treat with Rocephin afebrile  Dysphagia SLP on board Now on diet with pure and thin liquid D/c coretrack  Tobacco abuse Current smoker Smoking cessation counseling will be provided  Other Stroke Risk Factors Advanced Age >/= 65 37besity, Body mass index is 31.18 kg/m., BMI >/= 30 associated with increased stroke risk, recommend weight loss, diet and exercise as appropriate   Other Active  Problems Anemia 10.7 stable  Hospital day # 10  DenAugust AlbinoP, ACNP-AG  [facility] See Amion for schedule and pager information 05/25/2022 9:38 AM   ATTENDING NOTE: I reviewed above note and agree with the assessment and plan. Pt was seen and examined.   No family at the bedside.  Patient is sitting at edge of bed, working with PT/OT.  Patient still has left gaze and right hemiplegia.  However, ate well yesterday and this morning.  Dietitian on board, will DC core track.  Leukocytosis stable however still elevated, UA suggest UTI, currently on Rocephin treatment.  PT/OT recommend SNF.  For detailed assessment and plan, please refer to above/below as I have made changes wherever appropriate.   JinRosalin HawkingD PhD Stroke Neurology 05/25/2022 1:35 PM     To contact Stroke Continuity provider, please refer to [email or web address] hours, contact General Neurology

## 2022-05-25 NOTE — Inpatient Diabetes Management (Signed)
Inpatient Diabetes Program Recommendations  AACE/ADA: New Consensus Statement on Inpatient Glycemic Control (2015)  Target Ranges:  Prepandial:   less than 140 mg/dL      Peak postprandial:   less than 180 mg/dL (1-2 hours)      Critically ill patients:  140 - 180 mg/dL    Latest Reference Range & Units 05/23/22 23:56 05/24/22 04:14 05/24/22 07:40 05/24/22 16:32 05/24/22 19:55  Glucose-Capillary 70 - 99 mg/dL 217 (H) 211 (H) 202 (H) 323 (H) 385 (H)    Latest Reference Range & Units 05/25/22 00:25 05/25/22 04:02 05/25/22 12:26  Glucose-Capillary 70 - 99 mg/dL 239 (H) 220 (H) 286 (H)  (H): Data is abnormally high      Home DM Meds: Invokana 100 mg daily Amaryl 8 mg daily Metformin 1000 mg BID  Current Orders:  Novolog 10 units Q4 hours Novolog Moderate Correction Scale/ SSI (0-15 units) Q4 hours Semglee 16 units Daily     MD- Note tube feeds stopped this AM per RD notes  Will continue Dysphagic Diet + Glucerna Shake BID  Expect some improvement of Glucose levels now that tube feeds stopped  Please consider:  1. Stop the Novolog 10 units Q4 hours (tube feed coverage)  2. Change Novolog SSI to TID AC + HS  3. Start Novolog Meal Coverage: Novolog 4 units TID with meals HOLD if pt NPO HOLD it pt eats <50% meals    --Will follow patient during hospitalization--  Wyn Quaker RN, MSN, [place] Diabetes Coordinator Inpatient Glycemic Control Team Team Pager: [phone] (8a-5p)

## 2022-05-26 DIAGNOSIS — I6602 Occlusion and stenosis of left middle cerebral artery: Secondary | ICD-10-CM

## 2022-05-26 DIAGNOSIS — N390 Urinary tract infection, site not specified: Secondary | ICD-10-CM | POA: Diagnosis not present

## 2022-05-26 DIAGNOSIS — D72829 Elevated white blood cell count, unspecified: Secondary | ICD-10-CM

## 2022-05-26 DIAGNOSIS — I63232 Cerebral infarction due to unspecified occlusion or stenosis of left carotid arteries: Secondary | ICD-10-CM | POA: Diagnosis not present

## 2022-05-26 LAB — CBC
HCT: 34.3 % — ABNORMAL LOW (ref 36.0–46.0)
Hemoglobin: 10.8 g/dL — ABNORMAL LOW (ref 12.0–15.0)
MCH: 21.7 pg — ABNORMAL LOW (ref 26.0–34.0)
MCHC: 31.5 g/dL (ref 30.0–36.0)
MCV: 69 fL — ABNORMAL LOW (ref 80.0–100.0)
Platelets: 388 10*3/uL (ref 150–400)
RBC: 4.97 MIL/uL (ref 3.87–5.11)
RDW: 17.6 % — ABNORMAL HIGH (ref 11.5–15.5)
WBC: 13.6 10*3/uL — ABNORMAL HIGH (ref 4.0–10.5)
nRBC: 0 % (ref 0.0–0.2)

## 2022-05-26 LAB — BASIC METABOLIC PANEL
Anion gap: 15 (ref 5–15)
BUN: 45 mg/dL — ABNORMAL HIGH (ref 8–23)
CO2: 21 mmol/L — ABNORMAL LOW (ref 22–32)
Calcium: 9 mg/dL (ref 8.9–10.3)
Chloride: 101 mmol/L (ref 98–111)
Creatinine, Ser: 0.69 mg/dL (ref 0.44–1.00)
GFR, Estimated: >60 mL/min (ref >60)
Glucose, Bld: 238 mg/dL — ABNORMAL HIGH (ref 70–99)
Potassium: 5 mmol/L (ref 3.5–5.1)
Sodium: 137 mmol/L (ref 135–145)

## 2022-05-26 LAB — GLUCOSE, CAPILLARY
Glucose-Capillary: 294 mg/dL — ABNORMAL HIGH (ref 70–99)
Glucose-Capillary: 359 mg/dL — ABNORMAL HIGH (ref 70–99)
Glucose-Capillary: 307 mg/dL — ABNORMAL HIGH (ref 70–99)
Glucose-Capillary: 72 mg/dL (ref 70–99)

## 2022-05-26 NOTE — TOC Progression Note (Signed)
Transition of Care [facility]) - Progression Note    Patient Details  Name: Bailey [name] MRN: [id number] Date of Birth: 1956-01-05  Transition of [facility]) CM/SW [place], [place] Phone Number: 05/26/2022, 2:50 PM  Clinical Narrative:   CSW spoke with patient's son to confirm he received the letter and bed offers yesterday evening. Son has started reviewing SNF choices, will be able to complete after he is done work today and call CSW back with SNF choice. CSW updated patient's son that patient is medically ready for rehab when a choice is made, he indicated understanding. CSW to follow.    Expected Discharge Plan: [place] Barriers to Discharge: Continued Medical Work up, Ship broker  Expected Discharge Plan and Services Expected Discharge Plan: [place] Choice: [place] arrangements for the past 2 months: Single Family Home                                       Social Determinants of Health (SDOH) Interventions    Readmission Risk Interventions     No data to display

## 2022-05-26 NOTE — Progress Notes (Signed)
Speech Language Pathology Treatment: Dysphagia;Cognitive-Linquistic  Patient Details Name: Bailey [name] MRN: [id number] DOB: 1956-01-29 Today's Date: 05/26/2022 Time: 7544-9201 SLP Time Calculation (min) (ACUTE ONLY): 21 min  Assessment / Plan / Recommendation Clinical Impression  Communication: Bailey [name] demonstrated increased frequency of spontaneous vocalizations in addition to commonly produced affirmative "mm hmm." There was improved attention and effort. During singing tasks, she was able to produce some intelligible words in unison with clinician. She was able to match the pitch changes during familiar songs and despite most of words being unintelligible, she worked hard to produce the lyrics.  With modeling and tactile/verbal cues, she waved goodbye and attempted to say "bye." She was using more facial expressions to convey meaning today.   Swallowing: She is motivated to eat. Cortrak has been discontinued.  She held her cup with LUE and drank thin liquids with no s/s of aspiration; there was improved oral seal on right side. She consumed most of her Magic Cup with good oral attention/manipulation.    Bailey [name] is making excellent progress toward speech/swallowing goals and her efforts are recognizable.  SLP will continue to follow.   HPI HPI: Bailey [name] is a 66 y.o. female who presented to the ED via EMS for AMS and R side weakness.  CTA revealed large L MCA infarct. Bailey [name] underwent endovascular revascularization of occluded left internal carotid artery artery and left middle cerebral artery M1 8/26. Intubated 8/25 and self extubated 8/26. PMH:  HTN, hyperlipidemia, diabetes, tobacco abuse.      SLP Plan  Continue with current plan of care      Recommendations for follow up therapy are one component of a multi-disciplinary discharge planning process, led by the attending physician.  Recommendations may be updated based on patient status, additional functional criteria and insurance  authorization.    Recommendations  Diet recommendations: Dysphagia 1 (puree);Thin liquid Liquids provided via: Cup;Straw Medication Administration: Whole meds with puree Supervision: Staff to assist with self feeding Compensations: Slow rate;Small sips/bites                Oral Care Recommendations: Oral care BID Follow Up Recommendations: Skilled nursing-short term rehab (<3 hours/day) Assistance recommended at discharge: Frequent or constant Supervision/Assistance SLP Visit Diagnosis: Aphasia (R47.01);Dysphagia, oropharyngeal phase (R13.12) Plan: Continue with current plan of care         Bailey [name] L. Tivis Ringer, MA CCC/SLP Clinical Specialist - Acute Care SLP Acute Rehabilitation Services Office number [phone]   Bailey [name]  05/26/2022, 2:52 PM

## 2022-05-26 NOTE — Progress Notes (Addendum)
STROKE TEAM PROGRESS NOTE   INTERVAL HISTORY  Patient in bed sleeping and arousable  to examiners voice. No family at the bedside. NG Tube removed yesterday.Left gaze does not cross midline. Patient is aphasic, right arm flaccid, right leg with some withdrawal to painful stimuli  Vitals:   05/26/22 0443 05/26/22 0715 05/26/22 1138 05/26/22 1141  BP: (!) 136/53 135/73 (!) 126/46 (!) 120/45  Pulse: 89 92 99   Resp: '18 16 20   '$ Temp: 98.1 F (36.7 C) 98.4 F (36.9 C) 97.9 F (36.6 C)   TempSrc:  Oral Oral   SpO2: 100% 98% 100%   Weight:      Height:       CBC:  Recent Labs  Lab 05/25/22 0348 05/26/22 0418  WBC 14.7* 13.6*  HGB 10.7* 10.8*  HCT 34.0* 34.3*  MCV 69.2* 69.0*  PLT 361 599   Basic Metabolic Panel:  Recent Labs  Lab 05/25/22 0348 05/26/22 0418  NA 137 137  K 5.3* 5.0  CL 104 101  CO2 23 21*  GLUCOSE 218* 238*  BUN 42* 45*  CREATININE 0.66 0.69  CALCIUM 9.1 9.0   Lipid Panel:  No results for input(s): "CHOL", "TRIG", "HDL", "CHOLHDL", "VLDL", "LDLCALC" in the last 168 hours.  HgbA1c:  No results for input(s): "HGBA1C" in the last 168 hours.  Urine Drug Screen:  No results for input(s): "LABOPIA", "COCAINSCRNUR", "LABBENZ", "AMPHETMU", "THCU", "LABBARB" in the last 168 hours.   Alcohol Level  No results for input(s): "ETH" in the last 168 hours.   IMAGING past 24 hours No results found.  PHYSICAL EXAM  Physical Exam  Constitutional: Obese middle-age African-American female who is not in distress Respiratory: Effort normal, non-labored breathing  Neuro: Mental Status: Eyes open.  Globally aphasic with left gaze deviation, does not track . Non verbal this am Cranial Nerves: II: PERRL, 9m reactive III,IV, VI: No ptosis or diploplia. Left gaze deviation, does not cross midline. Does not blink to threat on right  XI: Head turned to the left Motor: Tone is normal. Bulk is normal. Spontaneous movement noted on the left.  Trace movement on  the right with flaccid hemiplegia, will move RLE to noxious stimuli.  Will not move RUE. No spontaneous movement seen  seen on right side      ASSESSMENT/PLAN Ms. SAiriel Oblingeris a 66y.o. female with history of HTN, hyperlipedmia, DM2, tobacco abuse presenting with waxing and waning stroke symptoms.  She last talked to family several days before admission.  Symptom discovery was at 2 PM on 8/26. Initial head CT was read with no acute intracranial process. Her initial examination for ED provider was notable for fluctuating right-sided weakness.  She was making repetitive statements ("I can't talk") and not following commands. She was taken to IR emergently for a mechanical thrombectomy and stent assisted angioplasty of the left ICA. Remained intubated post procedure. MRI and MRA show reocclusion of stent. Extubated 8/27.  No family at the bedside.  Patient is in bed sitting  upright in bed.Patient still has left gaze and right hemiplegia. Dietitian on board,leukocytosis stable and trending down,UA suggest UTI, currently on Rocephin treatment.   PT/OT recommend SNF.  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 Rescue left proximal ICA stent which was occluded, etiology likely due to large vessel disease Code Stroke CT head No acute abnormality. Small  vessel disease. Atrophy. ASPECTS 10.    CTA head & neck - The posterior left M2 segment is occluded with poor collaterals. 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 High-grade stenosis or occlusion of the left P2 segment.  CT perfusion Core 766m, Perfusion 1229m Mismatch 61 MRI-  acute infarct involving the posterior left MCA territory and posterior left insula. Additional punctate foci of acute nonhemorrhagic infarct are present  along the watershed distribution. 66m34mcute nonhemorrhagic infarct in the medial left thalamus MRA  No flow in the left ICA. Mod stenosis of the left P2  2D Echo EF 65-70% LDL 25 HgbA1c 7.2 UDS negative VTE prophylaxis - lovenox No antithrombotic prior to admission, now on aspirin 81 mg daily and Brilinta (ticagrelor) 90 mg bid.  Therapy recommendations: SNF disposition:  pending and awaiting SNF Placement   Carotid stenosis CTA head & neck - High-grade stenosis or occlusion of the left internal carotid artery at the bifurcation. Narrowing of less than 50% at the origin of the left common carotid artery. Calcified and noncalcified plaque bifurcation results in narrowing of the proximal right ICA. The lumen is narrowed to 1.5 mm. This compares with a more normal distal segment of 4 mm just below the skull base. Resulting 60% stenosis  Hypertension Home meds:  lisinopril Stable Now on lisinopril 40 and amlodipine 5 mg qday BP <180/105 Long-term BP goal 130-150 given left ICA occlusion  Hyperlipidemia Home meds:  Atorvastatin '40mg'$  LDL 25, goal < 70 Lipitor 40 mg resumed Continue statin at discharge  Diabetes type II Uncontrolled Home meds:  Metformin  HgbA1c 7.2, goal < 7.0 CBGs SSI On insulin  Close PCP follow up for better Dm control  UTI Leukocytosis WBC 13.8-> 13.3-> 14.4->14.7> 13.6  UA WBC > 50, urine culture Staph Iugdunensus CXR neg treat with Rocephin, will change to po Keflex on discharge afebrile  Dysphagia SLP on board Now on diet with pure and thin liquid and followed closely Cortrak removed  Tobacco abuse Current smoker Smoking cessation counseling will be provided  Other Stroke Risk Factors Advanced Age >/= 65 79besity, Body mass index is 31.18 kg/m., BMI >/= 30 associated with increased stroke risk, recommend weight loss, diet and exercise as appropriate   Other Active Problems Anemia 10.7 stable  Hospital day # 11  NadCher Nakai PA-[facility] See Amion for schedule and pager information 05/26/2022 1:39 PM    ATTENDING NOTE: I reviewed above note and agree with the assessment and plan. Pt was seen and examined.   No family at bedside.  Patient lying in bed, awake alert, but still has global aphasia in the right hemiplegia, right neglect and left gaze preference.  No acute event overnight.  Leukocytosis slowly trending down, still on Rocephin.  Discussed with pharmacy, will switch to Keflex on discharge.  P.o. intake adequate.  PT/OT recommended SNF.  Pending placement.  For detailed assessment and plan, please refer to above/below as I have made changes wherever appropriate.   JinRosalin HawkingD PhD Stroke Neurology 05/26/2022 2:54 PM     To contact Stroke Continuity provider, please refer to [email or web address], contact General Neurology

## 2022-05-26 NOTE — Progress Notes (Addendum)
Inpatient Diabetes Program Recommendations  AACE/ADA: New Consensus Statement on Inpatient Glycemic Control (2015)  Target Ranges:  Prepandial:   less than 140 mg/dL      Peak postprandial:   less than 180 mg/dL (1-2 hours)      Critically ill patients:  140 - 180 mg/dL   Lab Results  Component Value Date   GLUCAP 294 (H) 05/26/2022   HGBA1C 7.0 (H) 05/16/2022    Review of Glycemic Control  Latest Reference Range & Units 05/25/22 00:25 05/25/22 04:02 05/25/22 12:26 05/25/22 16:12 05/25/22 22:31 05/26/22 08:21  Glucose-Capillary 70 - 99 mg/dL 239 (H) 220 (H) 286 (H) 283 (H) 251 (H) 294 (H)   Diabetes history: DM 2 Home DM Meds: Invokana 100 mg daily Amaryl 8 mg daily Metformin 1000 mg BID  Current orders for Inpatient glycemic control:  Novolog 0-15 units tid with meals and HS Semglee 16 units daily Novolog 4 units tid with meals  Inpatient Diabetes Program Recommendations:    Consider increasing Semglee to 24 units daily.    Thanks,  Adah Perl, RN, BC-ADM Inpatient Diabetes Coordinator Pager [phone]  (8a-5p)

## 2022-05-27 DIAGNOSIS — I6602 Occlusion and stenosis of left middle cerebral artery: Secondary | ICD-10-CM | POA: Diagnosis not present

## 2022-05-27 DIAGNOSIS — I63232 Cerebral infarction due to unspecified occlusion or stenosis of left carotid arteries: Secondary | ICD-10-CM | POA: Diagnosis not present

## 2022-05-27 DIAGNOSIS — I10 Essential (primary) hypertension: Secondary | ICD-10-CM

## 2022-05-27 DIAGNOSIS — E7800 Pure hypercholesterolemia, unspecified: Secondary | ICD-10-CM

## 2022-05-27 DIAGNOSIS — D72829 Elevated white blood cell count, unspecified: Secondary | ICD-10-CM | POA: Diagnosis not present

## 2022-05-27 DIAGNOSIS — N390 Urinary tract infection, site not specified: Secondary | ICD-10-CM | POA: Diagnosis not present

## 2022-05-27 LAB — BASIC METABOLIC PANEL
Anion gap: 8 (ref 5–15)
BUN: 40 mg/dL — ABNORMAL HIGH (ref 8–23)
CO2: 21 mmol/L — ABNORMAL LOW (ref 22–32)
Calcium: 8.4 mg/dL — ABNORMAL LOW (ref 8.9–10.3)
Chloride: 105 mmol/L (ref 98–111)
Creatinine, Ser: 0.66 mg/dL (ref 0.44–1.00)
GFR, Estimated: >60 mL/min (ref >60)
Glucose, Bld: 212 mg/dL — ABNORMAL HIGH (ref 70–99)
Potassium: 5.2 mmol/L — ABNORMAL HIGH (ref 3.5–5.1)
Sodium: 134 mmol/L — ABNORMAL LOW (ref 135–145)

## 2022-05-27 LAB — CBC
HCT: 34.4 % — ABNORMAL LOW (ref 36.0–46.0)
Hemoglobin: 10.7 g/dL — ABNORMAL LOW (ref 12.0–15.0)
MCH: 21.9 pg — ABNORMAL LOW (ref 26.0–34.0)
MCHC: 31.1 g/dL (ref 30.0–36.0)
MCV: 70.5 fL — ABNORMAL LOW (ref 80.0–100.0)
Platelets: 236 10*3/uL (ref 150–400)
RBC: 4.88 MIL/uL (ref 3.87–5.11)
RDW: 17.7 % — ABNORMAL HIGH (ref 11.5–15.5)
WBC: 13.5 10*3/uL — ABNORMAL HIGH (ref 4.0–10.5)
nRBC: 0 % (ref 0.0–0.2)

## 2022-05-27 LAB — GLUCOSE, CAPILLARY
Glucose-Capillary: 210 mg/dL — ABNORMAL HIGH (ref 70–99)
Glucose-Capillary: 294 mg/dL — ABNORMAL HIGH (ref 70–99)
Glucose-Capillary: 214 mg/dL — ABNORMAL HIGH (ref 70–99)
Glucose-Capillary: 159 mg/dL — ABNORMAL HIGH (ref 70–99)

## 2022-05-27 MED ORDER — INSULIN GLARGINE-YFGN 100 UNIT/ML ~~LOC~~ SOLN
24.0000 [IU] | Freq: Every day | SUBCUTANEOUS | Status: DC
  Administered 2022-05-28: 24 [IU] via SUBCUTANEOUS
  Filled 2022-05-27: qty 0.24

## 2022-05-27 NOTE — Progress Notes (Signed)
Physical Therapy Treatment Patient Details Name: Bailey [name] MRN: [id number] DOB: June 27, 1956 Today's Date: 05/27/2022   History of Present Illness 66 yo female presenting 8/25 with R lateral lean, perseverative speech, and unable to follow commands. CTA revealed L MCA infarct. Pt underwent endovascular revascularization of occluded left internal carotid artery artery and left middle cerebral artery M1 8/26. Intubated 8/25 and self extubated 8/26. PMH includes: HTN, HLD, DM II, and tobacco use.    PT Comments    Pt remains non-verbal with significant R inattention and dense R hemiparesis however did demo improved command follow today and ability to perform std pvt transfer to recliner today with totalAx2 via use of bed pad and gait belt. Pt requiring maxA to maintain EOB balance with strong L lateral lean. Acute PT to cont to follow.   Recommendations for follow up therapy are one component of a multi-disciplinary discharge planning process, led by the attending physician.  Recommendations may be updated based on patient status, additional functional criteria and insurance authorization.  Follow Up Recommendations  Skilled nursing-short term rehab (<3 hours/day)     Assistance Recommended at Discharge Frequent or constant Supervision/Assistance  Patient can return home with the following Two people to help with walking and/or transfers;Two people to help with bathing/dressing/bathroom;Assistance with cooking/housework;Assist for transportation;Help with stairs or ramp for entrance;Direct supervision/assist for medications management;Direct supervision/assist for financial management;Assistance with feeding   Equipment Recommendations  Other (comment) (defer to post acute rehab)    Recommendations for Other Services       Precautions / Restrictions Precautions Precautions: Fall;Other (comment) Precaution Comments: R hemiparesis, R inattention, cortrak, Restrictions Weight Bearing  Restrictions: No     Mobility  Bed Mobility Overal bed mobility: Needs Assistance Bed Mobility: Rolling, Sidelying to Sit Rolling: Max assist Sidelying to sit: +2 for physical assistance, Max assist       General bed mobility comments: with multimodal cueing, able to bring L LE off bed. Assist for R LE and UE management. Good initiation of trunk elevation but overall modA+2. Able to roll towards R with assist for initiation and completion of rolling.    Transfers Overall transfer level: Needs assistance Equipment used: 2 person hand held assist (2 person face to face transfer with use of gait belt and bed pad) Transfers: Sit to/from Stand Sit to Stand: +2 physical assistance, Max assist Stand pivot transfers: Total assist, +2 physical assistance, +2 safety/equipment         General transfer comment: complete 2 sit to stand, pt with good power up on the L LE, R knee required blocking, pt unable to use R UE functionally, totalAx2 for std pvt transfer, pt unable to sequence stepping despite weight shifting assist and tactile cues    Ambulation/Gait               General Gait Details: unable   Stairs             Wheelchair Mobility    Modified Rankin (Stroke Patients Only) Modified Rankin (Stroke Patients Only) Pre-Morbid Rankin Score: No symptoms Modified Rankin: Severe disability     Balance Overall balance assessment: Needs assistance Sitting-balance support: Feet supported, Single extremity supported Sitting balance-Leahy Scale: Poor Sitting balance - Comments: requires UE support but requires mod-maxA to maintain sitting balance. Postural control: Right lateral lean, Posterior lean Standing balance support: Bilateral upper extremity supported Standing balance-Leahy Scale: Poor Standing balance comment: dependent on external support  Cognition Arousal/Alertness: Awake/alert Behavior During Therapy: Flat  affect Overall Cognitive Status: Difficult to assess Area of Impairment: Attention, Following commands, Safety/judgement, Awareness, Problem solving                   Current Attention Level: Focused Memory: Decreased short-term memory Following Commands: Follows one step commands inconsistently, Follows one step commands with increased time Safety/Judgement: Decreased awareness of safety, Decreased awareness of deficits Awareness: Intellectual Problem Solving: Slow processing, Decreased initiation, Difficulty sequencing, Requires verbal cues, Requires tactile cues General Comments: Pt with aphasia.  Required multimodal cues for any task.  Significant R inattention/neglect, required hand over hand assist to wash face or complete high five        Exercises      General Comments General comments (skin integrity, edema, etc.): VSS, HR at 100bpm      Pertinent Vitals/Pain Pain Assessment Pain Assessment: Faces Faces Pain Scale: No hurt    Home Living                          Prior Function            PT Goals (current goals can now be found in the care plan section) Acute Rehab PT Goals PT Goal Formulation: With patient/family Time For Goal Achievement: 05/31/22 Potential to Achieve Goals: Good Progress towards PT goals: Progressing toward goals    Frequency    Min 3X/week      PT Plan Current plan remains appropriate    Co-evaluation              AM-PAC PT "6 Clicks" Mobility   Outcome Measure  Help needed turning from your back to your side while in a flat bed without using bedrails?: A Lot Help needed moving from lying on your back to sitting on the side of a flat bed without using bedrails?: Total Help needed moving to and from a bed to a chair (including a wheelchair)?: Total Help needed standing up from a chair using your arms (e.g., wheelchair or bedside chair)?: Total Help needed to walk in hospital room?: Total Help needed  climbing 3-5 steps with a railing? : Total 6 Click Score: 7    End of Session Equipment Utilized During Treatment: Gait belt Activity Tolerance: Patient tolerated treatment well Patient left: with call bell/phone within reach;in bed;with bed alarm set Nurse Communication: Mobility status PT Visit Diagnosis: Other abnormalities of gait and mobility (R26.89);Hemiplegia and hemiparesis;Unsteadiness on feet (R26.81);Muscle weakness (generalized) (M62.81);Difficulty in walking, not elsewhere classified (R26.2);Other symptoms and signs involving the nervous system (R29.898) Hemiplegia - Right/Left: Right Hemiplegia - dominant/non-dominant: Dominant Hemiplegia - caused by: Cerebral infarction     Time: 1030-1056 PT Time Calculation (min) (ACUTE ONLY): 26 min  Charges:  $Therapeutic Activity: 8-22 mins $Neuromuscular Re-education: 8-22 mins                     Kittie Plater, PT, DPT Acute Rehabilitation Services Secure chat preferred Office #: [phone]    Berline Lopes 05/27/2022, 12:56 PM

## 2022-05-27 NOTE — Progress Notes (Signed)
Inpatient Diabetes Program Recommendations  AACE/ADA: New Consensus Statement on Inpatient Glycemic Control (2015)  Target Ranges:  Prepandial:   less than 140 mg/dL      Peak postprandial:   less than 180 mg/dL (1-2 hours)      Critically ill patients:  140 - 180 mg/dL   Lab Results  Component Value Date   GLUCAP 210 (H) 05/27/2022   HGBA1C 7.0 (H) 05/16/2022    Review of Glycemic Control  Latest Reference Range & Units 05/26/22 08:21 05/26/22 11:37 05/26/22 16:38 05/26/22 21:15 05/27/22 06:14  Glucose-Capillary 70 - 99 mg/dL 294 (H) 359 (H) 307 (H) 72 210 (H)  Diabetes history: DM 2 Home DM Meds: Invokana 100 mg daily Amaryl 8 mg daily Metformin 1000 mg BID  Current orders for Inpatient glycemic control:  Novolog 0-15 units tid with meals and HS Semglee 16 units daily Novolog 4 units tid with meals  Inpatient Diabetes Program Recommendations:    Consider increasing Semglee to 24 units daily.   Thanks, Adah Perl, RN, BC-ADM Inpatient Diabetes Coordinator Pager [phone]  (8a-5p)

## 2022-05-27 NOTE — Progress Notes (Addendum)
STROKE TEAM PROGRESS NOTE   INTERVAL HISTORY  Patient is sitting in a recliner and awake. She regards examiner and non verbal. No family at the bedside.Left gaze does not cross midline. Right inattention and dense hemiparesis.Patient is aphasic.She has been working with PT. CBG improving and followed by inpatient Diabetes teamand today increased SEMGLEE to 24 units daily PT/OT recommending SNF and awaiting for placement  Vitals:   05/27/22 0600 05/27/22 0734 05/27/22 1238 05/27/22 1617  BP:  (!) 143/55 (!) 100/41 (!) 113/48  Pulse:  90 79 91  Resp:  18    Temp:  98.2 F (36.8 C)    TempSrc:  Oral    SpO2:  97% 98% 100%  Weight: 83.5 kg     Height:       CBC:  Recent Labs  Lab 05/26/22 0418 05/27/22 0519  WBC 13.6* 13.5*  HGB 10.8* 10.7*  HCT 34.3* 34.4*  MCV 69.0* 70.5*  PLT 388 834   Basic Metabolic Panel:  Recent Labs  Lab 05/26/22 0418 05/27/22 0519  NA 137 134*  K 5.0 5.2*  CL 101 105  CO2 21* 21*  GLUCOSE 238* 212*  BUN 45* 40*  CREATININE 0.69 0.66  CALCIUM 9.0 8.4*   Lipid Panel:  No results for input(s): "CHOL", "TRIG", "HDL", "CHOLHDL", "VLDL", "LDLCALC" in the last 168 hours.  HgbA1c:  No results for input(s): "HGBA1C" in the last 168 hours.  Urine Drug Screen:  No results for input(s): "LABOPIA", "COCAINSCRNUR", "LABBENZ", "AMPHETMU", "THCU", "LABBARB" in the last 168 hours.   Alcohol Level  No results for input(s): "ETH" in the last 168 hours.   IMAGING past 24 hours No results found.  PHYSICAL EXAM  Physical Exam  Constitutional: Obese middle-age African-American female who is not in distress Respiratory: Effort normal, non-labored breathing  Neuro: Mental Status: Eyes open.  Globally aphasic with left gaze deviation, does not track . Non verbal.She is trying to communicate and did shake her head to questions Cranial Nerves: II: PERRL, 53m reactive III,IV, VI: No ptosis or diploplia. Left gaze deviation, does not cross midline. Does  not blink to threat on right  XI: Head turned to the left Motor: Tone is normal. Bulk is normal. Spontaneous movement noted on the left.  Trace movement on the right with flaccid hemiplegia, will move RLE to noxious stimuli.  Will not move RUE. No spontaneous movement seen  seen on right side      ASSESSMENT/PLAN Ms. SNakai Yardis a 66y.o. female with history of HTN, hyperlipedmia, DM2, tobacco abuse presenting with waxing and waning stroke symptoms.  She last talked to family several days before admission.  Symptom discovery was at 2 PM on 8/26. Initial head CT was read with no acute intracranial process. Her initial examination for ED provider was notable for fluctuating right-sided weakness.  She was making repetitive statements ("I can't talk") and not following commands. She was taken to IR emergently for a mechanical thrombectomy and stent assisted angioplasty of the left ICA. Remained intubated post procedure. MRI and MRA show reocclusion of stent. Extubated 8/27.  No family at the bedside.  Patient is in  chair sitting  upright, Patient still has left gaze and right hemiplegia. Dietitian on board,leukocytosis stable and trending down,UA suggest UTI, currently on Rocephin treatment.   PT/OT recommend SNF.  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 Rescue left proximal ICA stent which was occluded, etiology likely due to large vessel disease Code Stroke CT head No acute abnormality. Small vessel disease. Atrophy. ASPECTS 10.    CTA head & neck - The posterior left M2 segment is occluded with poor collaterals. 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 High-grade stenosis or occlusion of the left P2 segment.  CT perfusion Core 50m, Perfusion 1287m Mismatch  61 MRI-  acute infarct involving the posterior left MCA territory and posterior left insula. Additional punctate foci of acute nonhemorrhagic infarct are present along the watershed distribution. 56m46mcute nonhemorrhagic infarct in the medial left thalamus MRA  No flow in the left ICA. Mod stenosis of the left P2  2D Echo EF 65-70% LDL 25 HgbA1c 7.2 UDS negative VTE prophylaxis - lovenox No antithrombotic prior to admission, now on aspirin 81 mg daily and Brilinta (ticagrelor) 90 mg bid.  Therapy recommendations: SNF disposition:  pending and awaiting SNF Placement   Carotid stenosis CTA head & neck - High-grade stenosis or occlusion of the left internal carotid artery at the bifurcation. Narrowing of less than 50% at the origin of the left common carotid artery. Calcified and noncalcified plaque bifurcation results in narrowing of the proximal right ICA. The lumen is narrowed to 1.5 mm. This compares with a more normal distal segment of 4 mm just below the skull base. Resulting 60% stenosis  Hypertension Home meds:  lisinopril Stable on the low end Now on lisinopril 40 D/c amlodipine 5 mg qday BP <180/105 Long-term BP goal 130-150 given left ICA occlusion  Hyperlipidemia Home meds:  Atorvastatin '40mg'$  LDL 25, goal < 70 Lipitor 40 mg resumed Continue statin at discharge  Diabetes type II Uncontrolled Home meds:  Metformin  HgbA1c 7.2, goal < 7.0 CBGs SSI On insulin 16->24 U daily Close PCP follow up for better Dm control  UTI Leukocytosis WBC 13.8-> 13.3-> 14.4->14.7> 13.6->13.5  UA WBC > 50, urine culture Staph Iugdunensus CXR neg treat with Rocephin, will change to po Keflex on discharge afebrile  Dysphagia SLP on board Now on diet with pure and thin liquid and followed closely Cortrak removed  Tobacco abuse Current smoker Smoking cessation counseling will be provided  Other Stroke Risk Factors Advanced Age >/= 65 3besity, Body mass index is 30.63 kg/m.,  BMI >/= 30 associated with increased stroke risk, recommend weight loss, diet and exercise as appropriate   Other Active Problems Anemia 10.7 stable  Hospital day # 12  NadCher NakaiPA-C [facility] See Amion for schedule and pager information 05/27/2022 5:41 PM    ATTENDING NOTE: I reviewed above note and agree with the assessment and plan. Pt was seen and examined.   No family at bedside.  Patient lying in bed, awake alert, eyes open, smiling to provider.  However still have global aphasia and right hemiplegia, right hemianopia.  Continue DAPT and statin.  Leukocytosis stable, continue antibiotics.  BP on the lower end, DC amlodipine, continue lisinopril, continue BP monitoring.  Pending SNF placement.  For detailed assessment and plan, please refer to above/below as I have made changes wherever appropriate.   JinRosalin HawkingD PhD Stroke Neurology 05/27/2022 6:10 PM      To contact Stroke Continuity provider, please refer to [email or web address], contact General Neurology

## 2022-05-28 ENCOUNTER — Ambulatory Visit: Payer: Medicare HMO | Admitting: Psychiatry

## 2022-05-28 ENCOUNTER — Encounter: Payer: Self-pay | Admitting: Psychiatry

## 2022-05-28 DIAGNOSIS — E1140 Type 2 diabetes mellitus with diabetic neuropathy, unspecified: Secondary | ICD-10-CM | POA: Diagnosis not present

## 2022-05-28 DIAGNOSIS — I69320 Aphasia following cerebral infarction: Secondary | ICD-10-CM | POA: Diagnosis not present

## 2022-05-28 DIAGNOSIS — R451 Restlessness and agitation: Secondary | ICD-10-CM | POA: Diagnosis not present

## 2022-05-28 DIAGNOSIS — M6281 Muscle weakness (generalized): Secondary | ICD-10-CM | POA: Diagnosis not present

## 2022-05-28 DIAGNOSIS — I69951 Hemiplegia and hemiparesis following unspecified cerebrovascular disease affecting right dominant side: Secondary | ICD-10-CM | POA: Diagnosis not present

## 2022-05-28 DIAGNOSIS — E11649 Type 2 diabetes mellitus with hypoglycemia without coma: Secondary | ICD-10-CM | POA: Diagnosis not present

## 2022-05-28 DIAGNOSIS — I6602 Occlusion and stenosis of left middle cerebral artery: Secondary | ICD-10-CM | POA: Diagnosis not present

## 2022-05-28 DIAGNOSIS — E7800 Pure hypercholesterolemia, unspecified: Secondary | ICD-10-CM | POA: Diagnosis not present

## 2022-05-28 DIAGNOSIS — R262 Difficulty in walking, not elsewhere classified: Secondary | ICD-10-CM | POA: Diagnosis not present

## 2022-05-28 DIAGNOSIS — Z23 Encounter for immunization: Secondary | ICD-10-CM | POA: Diagnosis not present

## 2022-05-28 DIAGNOSIS — N898 Other specified noninflammatory disorders of vagina: Secondary | ICD-10-CM | POA: Diagnosis not present

## 2022-05-28 DIAGNOSIS — F410 Panic disorder [episodic paroxysmal anxiety] without agoraphobia: Secondary | ICD-10-CM | POA: Diagnosis not present

## 2022-05-28 DIAGNOSIS — E785 Hyperlipidemia, unspecified: Secondary | ICD-10-CM | POA: Diagnosis not present

## 2022-05-28 DIAGNOSIS — I69391 Dysphagia following cerebral infarction: Secondary | ICD-10-CM | POA: Diagnosis not present

## 2022-05-28 DIAGNOSIS — F411 Generalized anxiety disorder: Secondary | ICD-10-CM | POA: Diagnosis not present

## 2022-05-28 DIAGNOSIS — R1110 Vomiting, unspecified: Secondary | ICD-10-CM | POA: Diagnosis not present

## 2022-05-28 DIAGNOSIS — R2689 Other abnormalities of gait and mobility: Secondary | ICD-10-CM | POA: Diagnosis not present

## 2022-05-28 DIAGNOSIS — I69822 Dysarthria following other cerebrovascular disease: Secondary | ICD-10-CM | POA: Diagnosis not present

## 2022-05-28 DIAGNOSIS — M25531 Pain in right wrist: Secondary | ICD-10-CM | POA: Diagnosis not present

## 2022-05-28 DIAGNOSIS — I959 Hypotension, unspecified: Secondary | ICD-10-CM | POA: Diagnosis not present

## 2022-05-28 DIAGNOSIS — I10 Essential (primary) hypertension: Secondary | ICD-10-CM | POA: Diagnosis not present

## 2022-05-28 DIAGNOSIS — R4589 Other symptoms and signs involving emotional state: Secondary | ICD-10-CM | POA: Diagnosis not present

## 2022-05-28 DIAGNOSIS — R509 Fever, unspecified: Secondary | ICD-10-CM | POA: Diagnosis not present

## 2022-05-28 DIAGNOSIS — M24541 Contracture, right hand: Secondary | ICD-10-CM | POA: Diagnosis not present

## 2022-05-28 DIAGNOSIS — J449 Chronic obstructive pulmonary disease, unspecified: Secondary | ICD-10-CM | POA: Diagnosis not present

## 2022-05-28 DIAGNOSIS — Z7401 Bed confinement status: Secondary | ICD-10-CM | POA: Diagnosis not present

## 2022-05-28 DIAGNOSIS — I63232 Cerebral infarction due to unspecified occlusion or stenosis of left carotid arteries: Secondary | ICD-10-CM | POA: Diagnosis not present

## 2022-05-28 DIAGNOSIS — I6930 Unspecified sequelae of cerebral infarction: Secondary | ICD-10-CM | POA: Diagnosis not present

## 2022-05-28 DIAGNOSIS — E860 Dehydration: Secondary | ICD-10-CM | POA: Diagnosis not present

## 2022-05-28 DIAGNOSIS — E118 Type 2 diabetes mellitus with unspecified complications: Secondary | ICD-10-CM | POA: Diagnosis not present

## 2022-05-28 DIAGNOSIS — M62838 Other muscle spasm: Secondary | ICD-10-CM | POA: Diagnosis not present

## 2022-05-28 DIAGNOSIS — D649 Anemia, unspecified: Secondary | ICD-10-CM | POA: Diagnosis not present

## 2022-05-28 DIAGNOSIS — R55 Syncope and collapse: Secondary | ICD-10-CM | POA: Diagnosis not present

## 2022-05-28 DIAGNOSIS — I69398 Other sequelae of cerebral infarction: Secondary | ICD-10-CM | POA: Diagnosis not present

## 2022-05-28 DIAGNOSIS — R4189 Other symptoms and signs involving cognitive functions and awareness: Secondary | ICD-10-CM | POA: Diagnosis not present

## 2022-05-28 DIAGNOSIS — G8321 Monoplegia of upper limb affecting right dominant side: Secondary | ICD-10-CM | POA: Diagnosis not present

## 2022-05-28 DIAGNOSIS — I69351 Hemiplegia and hemiparesis following cerebral infarction affecting right dominant side: Secondary | ICD-10-CM | POA: Diagnosis not present

## 2022-05-28 DIAGNOSIS — Z79899 Other long term (current) drug therapy: Secondary | ICD-10-CM | POA: Diagnosis not present

## 2022-05-28 DIAGNOSIS — E119 Type 2 diabetes mellitus without complications: Secondary | ICD-10-CM | POA: Diagnosis not present

## 2022-05-28 DIAGNOSIS — M25561 Pain in right knee: Secondary | ICD-10-CM | POA: Diagnosis not present

## 2022-05-28 DIAGNOSIS — K219 Gastro-esophageal reflux disease without esophagitis: Secondary | ICD-10-CM | POA: Diagnosis not present

## 2022-05-28 DIAGNOSIS — I951 Orthostatic hypotension: Secondary | ICD-10-CM | POA: Diagnosis not present

## 2022-05-28 LAB — GLUCOSE, CAPILLARY
Glucose-Capillary: 220 mg/dL — ABNORMAL HIGH (ref 70–99)
Glucose-Capillary: 319 mg/dL — ABNORMAL HIGH (ref 70–99)
Glucose-Capillary: 214 mg/dL — ABNORMAL HIGH (ref 70–99)
Glucose-Capillary: 173 mg/dL — ABNORMAL HIGH (ref 70–99)

## 2022-05-28 MED ORDER — CEPHALEXIN 500 MG PO CAPS: 500.0000 mg | ORAL_CAPSULE | Freq: Four times a day (QID) | ORAL | 0 refills | Status: DC

## 2022-05-28 MED ORDER — ACETAMINOPHEN 325 MG PO TABS: 650.0000 mg | ORAL_TABLET | ORAL | 0 refills | Status: DC | PRN

## 2022-05-28 MED ORDER — TICAGRELOR 90 MG PO TABS: 90.0000 mg | ORAL_TABLET | Freq: Two times a day (BID) | ORAL | 1 refills | Status: DC

## 2022-05-28 MED ORDER — CHLORHEXIDINE GLUCONATE CLOTH 2 % EX PADS: 6.0000 | MEDICATED_PAD | Freq: Every day | CUTANEOUS | 0 refills | Status: DC

## 2022-05-28 MED ORDER — INSULIN GLARGINE-YFGN 100 UNIT/ML ~~LOC~~ SOLN: 24.0000 [IU] | Freq: Every day | SUBCUTANEOUS | 11 refills | Status: DC

## 2022-05-28 MED ORDER — ASPIRIN 81 MG PO CHEW: 81.0000 mg | CHEWABLE_TABLET | Freq: Every day | ORAL | 1 refills | Status: DC

## 2022-05-28 MED ORDER — ADULT MULTIVITAMIN W/MINERALS CH: 1.0000 | ORAL_TABLET | Freq: Every day | ORAL | 0 refills | Status: DC

## 2022-05-28 MED ORDER — INSULIN ASPART 100 UNIT/ML IJ SOLN: 4.0000 [IU] | Freq: Three times a day (TID) | INTRAMUSCULAR | 11 refills | Status: DC

## 2022-05-28 MED ORDER — CEPHALEXIN 500 MG PO CAPS
500.0000 mg | ORAL_CAPSULE | Freq: Two times a day (BID) | ORAL | 0 refills | Status: AC
Start: 2022-05-28 — End: 2022-06-07

## 2022-05-28 NOTE — Discharge Summary (Signed)
Stroke Discharge Summary  Patient ID: Kalis Friese   MRN: [id number]      DOB: [birth year]  Date of Admission: 05/15/2022 Date of Discharge: 05/28/2022  Attending Physician:  Stroke, Md, MD, Stroke MD Consultant(s):    pulmonary/intensive care  Patient's PCP:  Darreld Mclean, MD  DISCHARGE DIAGNOSIS:  Principal Problem:   Acute cerebrovascular accident (CVA) due to occlusion of left carotid artery [facility]) Active Problems:   Middle cerebral artery embolism, left UTI  Diabetes Mellitus II uncontrolled Hyperlipidemia Dysphagia Tobacco Abuse   Allergies as of 05/28/2022   No Known Allergies      Medication List     STOP taking these medications    canagliflozin 100 MG Tabs tablet Commonly known as: Invokana   dicyclomine 20 MG tablet Commonly known as: BENTYL   glimepiride 4 MG tablet Commonly known as: AMARYL   metFORMIN 1000 MG tablet Commonly known as: GLUCOPHAGE   naproxen 500 MG tablet Commonly known as: NAPROSYN       TAKE these medications    acetaminophen 500 MG tablet Commonly known as: TYLENOL Take 1,000 mg by mouth every 6 (six) hours as needed (pain). Reported on 04/08/2016   albuterol 108 (90 Base) MCG/ACT inhaler Commonly known as: VENTOLIN HFA Inhale 2 puffs into the lungs every 6 (six) hours as needed for wheezing or shortness of breath.   alendronate 70 MG tablet Commonly known as: FOSAMAX Take 1 tablet (70 mg total) by mouth every 7 (seven) days. Take with a full glass of water on an empty stomach.   aspirin 81 MG chewable tablet Chew 1 tablet (81 mg total) by mouth daily. Start taking on: May 29, 2022   atorvastatin 40 MG tablet Commonly known as: LIPITOR TAKE 1 TABLET EVERY DAY   AZO Boric Acid 600 MG Supp Generic drug: Boric Acid Vaginal Place 1 suppository vaginally at bedtime.   butalbital-acetaminophen-caffeine 50-325-40 MG tablet Commonly known as: FIORICET Take 1-2 tablets by mouth every 6 (six) hours as  needed for headache. Max 6 per day   cephALEXin 500 MG capsule Commonly known as: KEFLEX Take 1 capsule (500 mg total) by mouth 2 (two) times daily for 10 days.   cholestyramine 4 g packet Commonly known as: QUESTRAN DISSOLVE & TAKE 1 POWDER PACKET BY MOUTH TWICE DAILY   cyclobenzaprine 5 MG tablet Commonly known as: FLEXERIL Take 1 tablet (5 mg total) by mouth at bedtime. Use as needed for neck pain   insulin aspart 100 UNIT/ML injection Commonly known as: novoLOG Inject 4 Units into the skin 3 (three) times daily with meals.   insulin glargine-yfgn 100 UNIT/ML injection Commonly known as: SEMGLEE Inject 0.24 mLs (24 Units total) into the skin daily. Start taking on: May 29, 2022   lisinopril 40 MG tablet Commonly known as: ZESTRIL Take 1 tablet (40 mg total) by mouth daily.   multivitamin with minerals Tabs tablet Take 1 tablet by mouth daily. Start taking on: May 29, 2022   pantoprazole 40 MG tablet Commonly known as: PROTONIX Take 1 tablet (40 mg total) by mouth 2 (two) times daily.   ticagrelor 90 MG Tabs tablet Commonly known as: BRILINTA Take 1 tablet (90 mg total) by mouth 2 (two) times daily.   triamcinolone cream 0.1 % Commonly known as: KENALOG Apply 1 application topically 2 (two) times daily. Use as needed on dry or scaly areas        LABORATORY STUDIES CBC    Component  Value Date/Time   WBC 13.5 (H) 05/27/2022 0519   RBC 4.88 05/27/2022 0519   HGB 10.7 (L) 05/27/2022 0519   HCT 34.4 (L) 05/27/2022 0519   PLT 236 05/27/2022 0519   MCV 70.5 (L) 05/27/2022 0519   MCV 69.7 (A) 04/08/2016 1411   MCH 21.9 (L) 05/27/2022 0519   MCHC 31.1 05/27/2022 0519   RDW 17.7 (H) 05/27/2022 0519   LYMPHSABS 2.3 05/16/2022 0405   MONOABS 0.8 05/16/2022 0405   EOSABS 0.1 05/16/2022 0405   BASOSABS 0.1 05/16/2022 0405   CMP    Component Value Date/Time   NA 134 (L) 05/27/2022 0519   K 5.2 (H) 05/27/2022 0519   CL 105 05/27/2022 0519   CO2 21  (L) 05/27/2022 0519   GLUCOSE 212 (H) 05/27/2022 0519   BUN 40 (H) 05/27/2022 0519   CREATININE 0.66 05/27/2022 0519   CREATININE 0.87 04/08/2016 1350   CALCIUM 8.4 (L) 05/27/2022 0519   PROT 5.7 (L) 05/16/2022 0405   ALBUMIN 2.9 (L) 05/16/2022 0405   AST 13 (L) 05/16/2022 0405   ALT 16 05/16/2022 0405   ALKPHOS 71 05/16/2022 0405   BILITOT 0.5 05/16/2022 0405   GFRNONAA >60 05/27/2022 0519   GFRAA >60 02/05/2015 0310   COAGS Lab Results  Component Value Date   INR 1.0 05/15/2022   Lipid Panel    Component Value Date/Time   CHOL 131 05/16/2022 0405   TRIG 79 05/17/2022 0725   HDL 36 (L) 05/16/2022 0405   CHOLHDL 3.6 05/16/2022 0405   VLDL 70 (H) 05/16/2022 0405   LDLCALC 25 05/16/2022 0405   HgbA1C  Lab Results  Component Value Date   HGBA1C 7.0 (H) 05/16/2022   Urinalysis    Component Value Date/Time   COLORURINE YELLOW 05/24/2022 0915   APPEARANCEUR CLOUDY (A) 05/24/2022 0915   LABSPEC 1.019 05/24/2022 0915   PHURINE 6.0 05/24/2022 0915   GLUCOSEU 50 (A) 05/24/2022 0915   HGBUR SMALL (A) 05/24/2022 0915   BILIRUBINUR NEGATIVE 05/24/2022 0915   BILIRUBINUR Negative 04/23/2022 0953   KETONESUR NEGATIVE 05/24/2022 0915   PROTEINUR NEGATIVE 05/24/2022 0915   UROBILINOGEN 0.2 04/23/2022 0953   UROBILINOGEN 0.2 02/05/2015 0322   NITRITE NEGATIVE 05/24/2022 0915   LEUKOCYTESUR LARGE (A) 05/24/2022 0915   Urine Drug Screen     Component Value Date/Time   LABOPIA NONE DETECTED 05/16/2022 0405   COCAINSCRNUR NONE DETECTED 05/16/2022 0405   LABBENZ NONE DETECTED 05/16/2022 0405   AMPHETMU NONE DETECTED 05/16/2022 0405   THCU NONE DETECTED 05/16/2022 0405   LABBARB NONE DETECTED 05/16/2022 0405    Alcohol Level    Component Value Date/Time   ETH <10 05/15/2022 1824     SIGNIFICANT DIAGNOSTIC STUDIES DG CHEST PORT 1 VIEW  Result Date: 05/24/2022 CLINICAL DATA:  Leukocytosis EXAM: PORTABLE CHEST 1 VIEW COMPARISON:  May 20, 2022 FINDINGS: A feeding tube  terminates below today's film. No pneumothorax. The heart, hila, mediastinum, lungs, and pleura are unremarkable. IMPRESSION: No cause for leukocytosis identified. The feeding tube terminates below today's film. Electronically Signed   By: Dorise Bullion III M.D.   On: 05/24/2022 10:28   DG Swallowing Func-Speech Pathology  Result Date: 05/21/2022 Table formatting from the original result was not included. Objective Swallowing Evaluation: Type of Study: MBS-Modified Barium Swallow Study  Patient Details Name: Abelina Ketron MRN: [id number] Date of Birth: 07-29-1956 Today's Date: 05/21/2022 Time: SLP Start Time (ACUTE ONLY): 1030 -SLP Stop Time (ACUTE ONLY): 1038 SLP Time Calculation (min) (  ACUTE ONLY): 8 min Past Medical History: Past Medical History: Diagnosis Date  Arthritis   Cataract   COPD (chronic obstructive pulmonary disease) ([place])   Diabetes mellitus without complication ([place])   Fibrocystic breast disease July 2016  GERD (gastroesophageal reflux disease)   Hyperlipidemia   Hypertension  Past Surgical History: Past Surgical History: Procedure Laterality Date  BREAST BIOPSY Right 04/19/2015  benign  BREAST BIOPSY Right 11/01/2020  fibroadenoma  CATARACT EXTRACTION    CHOLECYSTECTOMY    COLONOSCOPY    GALLBLADDER SURGERY    IR ANGIO INTRA EXTRACRAN SEL COM CAROTID INNOMINATE UNI R MOD SED  05/16/2022  IR ANGIO VERTEBRAL SEL VERTEBRAL UNI L MOD SED  05/16/2022  IR CT HEAD LTD  05/16/2022  IR CT HEAD LTD  05/16/2022  IR CT HEAD LTD  05/16/2022  IR INTRAVSC STENT CERV CAROTID W/O EMB-PROT MOD SED INC ANGIO  05/16/2022  IR PERCUTANEOUS ART THROMBECTOMY/INFUSION INTRACRANIAL INC DIAG ANGIO  05/16/2022  RADIOLOGY WITH ANESTHESIA N/A 05/15/2022  Procedure: IR WITH ANESTHESIA;  Surgeon: Radiologist, Medication, MD;  Location: [place];  Service: Radiology;  Laterality: N/A;  UTERINE FIBROID SURGERY   HPI: Pt is a 66 y.o. female who presented to the ED via EMS for AMS and R side weakness.  CTA revealed large L MCA infarct.  Pt underwent endovascular revascularization of occluded left internal carotid artery artery and left middle cerebral artery M1 8/26. Intubated 8/25 and self extubated 8/26. PMH:  HTN, hyperlipidemia, diabetes, tobacco abuse.  Subjective: alert  Recommendations for follow up therapy are one component of a multi-disciplinary discharge planning process, led by the attending physician.  Recommendations may be updated based on patient status, additional functional criteria and insurance authorization. Assessment / Plan / Recommendation   05/21/2022  10:51 AM Clinical Impressions Clinical Impression Pt presents with a moderate oral dysphagia c/b reduced lingual strength and coordination and possible bolus inattention.  These deficits resulted in oral holding of bolus trials, prolonged mastication, and oral residuals.  Pt did not benefit from verbal cues to swallow or to expectorate residuals.  Pharyngeal phase of swallow was grossly normal with transient penetration of thin liquid by straw on initial trial of study only.  There was no further penetration or aspiration on any other trials of any consistencies, including further trials of thin liquid by cup and straw and larger liquid bolus size.  Recommend puree solids with thin liquid. Pt will need 1:1 feeding assistance. Impact on safety and function Mild aspiration risk     05/21/2022  10:51 AM Treatment Recommendations Treatment Recommendations Therapy as outlined in treatment plan below     05/21/2022  10:56 AM Prognosis Prognosis for Safe Diet Advancement Good   05/21/2022  10:51 AM Diet Recommendations SLP Diet Recommendations Dysphagia 1 (Puree) solids;Thin liquid Liquid Administration via Cup;Straw Medication Administration Crushed with puree Compensations Slow rate;Small sips/bites Postural Changes Seated upright at 90 degrees     05/21/2022  10:51 AM Other Recommendations Oral Care Recommendations Oral care BID Follow Up Recommendations Acute inpatient rehab  (3hours/day) Assistance recommended at discharge Frequent or constant Supervision/Assistance Functional Status Assessment Patient has had a recent decline in their functional status and demonstrates the ability to make significant improvements in function in a reasonable and predictable amount of time.   05/21/2022  10:51 AM Frequency and Duration  Speech Therapy Frequency (ACUTE ONLY) min 2x/week Treatment Duration 2 weeks     05/21/2022  10:45 AM Oral Phase Oral Phase Impaired Oral - Thin Cup Right  anterior bolus loss;Lingual pumping;Premature spillage Oral - Thin Straw Right anterior bolus loss;Lingual pumping Oral - Puree Lingual pumping;Reduced posterior propulsion Oral - Mech Soft Pocketing in anterior sulcus;Impaired mastication;Weak lingual manipulation;Lingual pumping;Reduced posterior propulsion;Decreased bolus cohesion;Lingual/palatal residue    05/21/2022  10:47 AM Pharyngeal Phase Pharyngeal Phase Impaired Pharyngeal- Thin Cup [facility] Pharyngeal Material does not enter airway Pharyngeal- Thin Straw Delayed swallow initiation-vallecula Pharyngeal Material enters airway, remains ABOVE vocal cords then ejected out Pharyngeal- Puree [facility] Pharyngeal Material does not enter airway Pharyngeal- Mechanical Soft WFL Pharyngeal Material does not enter airway    05/21/2022  10:51 AM Cervical Esophageal Phase  Cervical Esophageal Phase [facility] Celedonio Savage, MA, CCC-SLP Acute Rehabilitation Services Office: [phone] 05/21/2022, 10:59 AM                     DG CHEST PORT 1 VIEW  Result Date: 05/20/2022 CLINICAL DATA:  Respiratory abnormalities EXAM: PORTABLE CHEST 1 VIEW COMPARISON:  Chest x-ray dated May 15, 2022 FINDINGS: Interval feeding tube placement which is partially visualized coursing below the diaphragm. Cardiac and mediastinal contours are within normal limits for exam technique. Lungs are clear. Large pleural effusion or pneumothorax. IMPRESSION: Lungs are clear. Electronically Signed   By: Yetta Glassman  M.D.   On: 05/20/2022 10:01   DG Abd Portable 1V  Result Date: 05/19/2022 CLINICAL DATA:  Feeding tube placement EXAM: PORTABLE ABDOMEN - 1 VIEW COMPARISON:  05/17/2022 FINDINGS: Interval removal of esophagogastric tube, with placement of a non weighted enteric feeding tube. Tip is positioned in the vicinity of the duodenal bulb. Nonobstructive pattern of included bowel gas. IMPRESSION: Interval removal of esophagogastric tube, with placement of a non weighted enteric feeding tube. Tip is positioned in the vicinity of the duodenal bulb. Consider advancement post pyloric positioning at the duodenal jejunal junction is desired. Electronically Signed   By: Delanna Ahmadi M.D.   On: 05/19/2022 14:51   IR PERCUTANEOUS ART THROMBECTOMY/INFUSION INTRACRANIAL INC DIAG ANGIO  Result Date: 05/19/2022 INDICATION: New onset of global aphasia, right-sided hemiplegia and left-sided neglect. EXAM: 1. EMERGENT LARGE VESSEL OCCLUSION THROMBOLYSIS (anterior CIRCULATION) COMPARISON:  None Available. MEDICATIONS: Ancef 2 g IV antibiotic was administered within 1 hour of the procedure. ANESTHESIA/SEDATION: General anesthesia. CONTRAST:  Omnipaque 300 150 mL. FLUOROSCOPY TIME:  Fluoroscopy Time: 57 minutes 06 seconds (3927 mGy). COMPLICATIONS: None immediate. TECHNIQUE: Following a full explanation of the procedure along with the potential associated complications, an informed witnessed consent was obtained. The risks of intracranial hemorrhage of 10%, worsening neurological deficit, ventilator dependency, death and inability to revascularize were all reviewed in detail with the patient's son. The patient was then put under general anesthesia by the Department of Anesthesiology at [facility]. The right groin was prepped and draped in the usual sterile fashion. Thereafter using modified Seldinger technique, transfemoral access into the right common femoral artery was obtained without difficulty. Over a 0.035 inch guidewire  an 8 French 25 cm Pinnacle sheath was inserted. Through this, and also over a 0.035 inch guidewire a combination of a 5.5 [place] Bernstein support catheter inside of balloon guide catheter was then advanced and positioned in the left common carotid artery. FINDINGS: The left common carotid arteriogram demonstrates the left external carotid artery and its major branches to be widely patent. The left internal carotid artery at the bulb demonstrates complete angiographic occlusion without evidence of a delayed string sign. PROCEDURE: An 014 inch Synchro soft micro guidewire inside of an 021 150 cm microcatheter was  advanced to the distal end of the balloon guide catheter. Using a torque device, access was obtained into the distal left internal carotid artery petrous segment followed by the microcatheter. Micro guidewire was advanced into the distal M2 region of the superior division. The guide wire was removed. Good aspiration was obtained from hub of the microcatheter. A 3 mm x 20 mm was then gently advanced to the supraclinoid left ICA. A gentle control arteriogram performed through this demonstrated occlusion of the superior and the inferior divisions with defects in the M1 segment. The microcatheter was then advanced into the distal MCA inferior division M3 region. The guidewire was removed. Good aspiration was obtained from the hub of the microcatheter. This was then connected to continuous heparinized saline infusion. A 4 mm x 40 mm Solitaire X retrieval device was then advanced to the distal end of the micro catheter. This was then deployed by unsheathing the microcatheter. With proximal flow arrest in the left internal carotid artery at the bulb, constant aspiration was applied at the hub of the balloon guide catheter proximally. After a minute and a half retrieval of the retrieval device of the microcatheter, and the balloon guide catheter into the distal left internal carotid artery. Reversal of flow arrest,  a control arteriogram performed through the balloon guide catheter demonstrated severe stenosis at the bulb. The left internal carotid artery demonstrated caliber irregularity in its mid cervical segment. More distally, the left internal carotid artery demonstrated opacification into the petrous, the cavernous and the supraclinoid segments. The left middle cerebral artery proximally demonstrates wide patency while noted in the superior division and the inferior division in the M2 segment. Left internal carotid artery demonstrated opacification. A TICI 2B revascularization was obtained. The balloon guide catheter in the proximal left internal carotid artery, a second pass was made with the 021 microcatheter advanced over an 014 inch soft tip into the M2 M3 region of the superior division. The guidewire was removed. Good aspiration was obtained at the hub of the microcatheter. A 3 mm x 20 mm Solitaire X retrieval device was then advanced to the distal end of the microcatheter, and deployed in the usual manner. Proximal flow arrest in the left internal carotid artery, and constant aspiration applied the hub of the balloon guide catheter with a Penumbra aspiration device, the combination of the retrieval device and the microcatheter were retrieved and removed. Following reversal of flow arrest, a control arteriogram performed through the balloon guide catheter in the proximal left internal carotid artery continued to demonstrate severe stenosis at the bulb. Free flow was noted more distally into the cervical petrous, cavernous and supraclinoid left ICA. The left anterior cerebral artery and the left middle cerebral artery demonstrated improved revascularization with patency of the inferior division. There continued be occlusion of the inferior division posterior parietal branch in the M3 region. A TICI 2C revascularization was obtained. A third pass was then made using a 055 132 cm Zoom aspiration catheter inside of  which was an 021 150 cm microcatheter advanced over an 014 inch soft tip micro guidewire to the M1 segment followed by advancement of the micro guidewire and microcatheter through the occluded M3 region of the parietal left MCA branch. The guidewire was removed. Good aspiration obtained from the hub of the micro catheter. A 4 mm x 40 mm Solitaire X retrieval device was then deployed after having ascertained safe positioning of the tip of the microcatheter. At this time the Zoom aspiration catheter was advanced  into the mid M3 segment proximal to the retrieval device. Proximal flow arrest in the left internal carotid artery, and constant aspiration applied at the hub of the hub of the Zoom aspiration catheter, and the balloon guide catheter in the proximal left ICA, the combination of the retrieval device, the microcatheter and the retrieval device was retrieved and removed. Control arteriogram performed following reversal of flow arrest now demonstrated further improvement in the flow through the probably inferior division into the M4 segment. However, there continued to be 2-3 micro filling defects in the M4 region. A TICI 2C revascularization was maintained though with improved perfusion more distally. An exchange wire over a microcatheter was then advanced through the balloon guide catheter in the proximal left ICA by replacement of the microcatheter with an 014 inch 300 cm exchange micro guidewire. The balloon guide catheter was retrieved just proximal to the left ICA origin. A 4 mm x 20 mm balloon angioplasty microcatheter was then prepped in the usual manner retrogradely and antegradely. This was then advanced using the rapid exchange technique and positioned such that the distal and proximal markers covered the segmental high-grade stenosis of the proximal left ICA. A control inflation was then performed with the balloon being inflated to approximately 4 mm for approximately a minute and a half using micro  inflation syringe device via micro tubing. Balloon was deflated and retrieved and removed. Control arteriogram performed through the balloon guide catheter in the left common carotid artery demonstrated mildly increased caliber of the angioplastied segment. A control arteriogram performed approximately a couple of minutes later demonstrated rerecoil narrowing within the angioplastied segment. A CT of the brain demonstrated a small focus of hyperattenuation in the posterior-lateral putamen surrounded by contrast stain. Bolus dose of IV Cangrelor followed by a 4 hour infusion was then started. The patient had already been loaded with Brilinta 180 mg and aspirin 81 mg after confirmation of the occlusion of the left internal carotid artery at the beginning of the procedure. A 6-8 mm x 40 mm Xact stent apparatus which had been prepped antegradely with 50% contrast and 50% heparinized saline infusion antegradely with heparinized saline infusion was advanced and positioned within the proximal distal from the angioplastied segment. The stent was deployed without any difficulty and the delivery apparatus was removed. Control arteriograms were then performed at 15, 30 and 40 minutes post deployment. These continued to demonstrate progressive occlusion of the stented left internal carotid artery. Control arteriogram through the balloon guide catheter demonstrated progressive occlusion. Antegrade flow was noted in the cavernous and supraclinoid segment of the external carotid collaterals via the ophthalmic artery. Flow was noted into the supraclinoid left ICA and also into the left MCA distribution. The exchange micro guidewire and the balloon guide catheter were retrieved and removed. A diagnostic catheter was then advanced into the right common carotid artery. A 5 French diagnostic catheter was then advanced over an 035 inch Rosen guidewire and positioned in the right common carotid artery and the left vertebral artery. The  left common carotid arteriogram demonstrates approximately 50% stenosis in the origin of the right external carotid artery with patent branches. The right internal carotid artery at the bulb demonstrates approximately 60-65% stenosis without any evidence of ulcerations or of intraluminal filling defects. Free flow was evident to the petrous, the cavernous and the supraclinoid right ICA. The right middle and anterior cerebral arteries opacify into the capillary and venous phases. Prompt cross-filling via the anterior communicating artery of the left anterior  cerebral artery A2 segment and distally was noted. Also noted was opacification of the distal left M1 segment. The left vertebral artery origin and its origin to the cranial skull base. Patency was maintained of the left posterior-inferior cerebellar artery and the left vertebrobasilar junction. The basilar artery, the posterior cerebral arteries, the superior cerebellar arteries and the anterior-inferior cerebellar arteries demonstrate opacification into the capillary and venous phases. Mild caliber of the P1 segments suggest intracranial arteriosclerosis. Also noted was prompt retrograde opacification via the left posterior communicating artery of the left supraclinoid left ICA and the MCA distribution with patency maintained of the superior division, and also the inferior division to the M3 region. Partial retrograde opacification was demonstrated the left PCA P3 pial collaterals. The 8 French Pinnacle sheath was removed. Hemostasis was achieved with an 8 [place] Angio-Seal closure device. Distal pulses remained unchanged bilaterally. A flat panel CT of the brain demonstrated distribution of the previously evaluation probably suggestive of contrast staining rather than hemorrhage. No midline shift was noted. Patient was then intubated because of her medical condition. She was then transferred to the neuro ICU for post revascularization care. IMPRESSION: Status  post endovascular revascularization of occluded left internal carotid artery and the left middle cerebral artery with 2 passes with a 4 mm retrieval device and contact aspiration, and 1 pass with a 3 mm x 20 mm Solitaire X retrieval device with contact aspiration achieving a TICI 2C revascularization. 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 Reocclusion of the stented portion of the left internal carotid due to aggressive platelet response despite use of dual antiplatelets, and intravenous Cangrelor. PLAN: Return to clinic for follow up 4 weeks post discharge. Electronically Signed   By: Luanne Bras M.D.   On: 05/19/2022 10:28   IR CT [facility]  Result Date: 05/19/2022 INDICATION: New onset of global aphasia, right-sided hemiplegia and left-sided neglect. EXAM: 1. EMERGENT LARGE VESSEL OCCLUSION THROMBOLYSIS (anterior CIRCULATION) COMPARISON:  None Available. MEDICATIONS: Ancef 2 g IV antibiotic was administered within 1 hour of the procedure. ANESTHESIA/SEDATION: General anesthesia. CONTRAST:  Omnipaque 300 150 mL. FLUOROSCOPY TIME:  Fluoroscopy Time: 57 minutes 06 seconds (3927 mGy). COMPLICATIONS: None immediate. TECHNIQUE: Following a full explanation of the procedure along with the potential associated complications, an informed witnessed consent was obtained. The risks of intracranial hemorrhage of 10%, worsening neurological deficit, ventilator dependency, death and inability to revascularize were all reviewed in detail with the patient's son. The patient was then put under general anesthesia by the Department of Anesthesiology at [facility]. The right groin was prepped and draped in the usual sterile fashion. Thereafter using modified Seldinger technique, transfemoral access into the right common femoral artery was obtained without difficulty. Over a 0.035 inch guidewire an 8 French 25 cm Pinnacle sheath was inserted.  Through this, and also over a 0.035 inch guidewire a combination of a 5.5 [place] Bernstein support catheter inside of balloon guide catheter was then advanced and positioned in the left common carotid artery. FINDINGS: The left common carotid arteriogram demonstrates the left external carotid artery and its major branches to be widely patent. The left internal carotid artery at the bulb demonstrates complete angiographic occlusion without evidence of a delayed string sign. PROCEDURE: An 014 inch Synchro soft micro guidewire inside of an 021 150 cm microcatheter was advanced to the distal end of the balloon guide catheter. Using a torque device, access was obtained into the distal  left internal carotid artery petrous segment followed by the microcatheter. Micro guidewire was advanced into the distal M2 region of the superior division. The guide wire was removed. Good aspiration was obtained from hub of the microcatheter. A 3 mm x 20 mm was then gently advanced to the supraclinoid left ICA. A gentle control arteriogram performed through this demonstrated occlusion of the superior and the inferior divisions with defects in the M1 segment. The microcatheter was then advanced into the distal MCA inferior division M3 region. The guidewire was removed. Good aspiration was obtained from the hub of the microcatheter. This was then connected to continuous heparinized saline infusion. A 4 mm x 40 mm Solitaire X retrieval device was then advanced to the distal end of the micro catheter. This was then deployed by unsheathing the microcatheter. With proximal flow arrest in the left internal carotid artery at the bulb, constant aspiration was applied at the hub of the balloon guide catheter proximally. After a minute and a half retrieval of the retrieval device of the microcatheter, and the balloon guide catheter into the distal left internal carotid artery. Reversal of flow arrest, a control arteriogram performed through the  balloon guide catheter demonstrated severe stenosis at the bulb. The left internal carotid artery demonstrated caliber irregularity in its mid cervical segment. More distally, the left internal carotid artery demonstrated opacification into the petrous, the cavernous and the supraclinoid segments. The left middle cerebral artery proximally demonstrates wide patency while noted in the superior division and the inferior division in the M2 segment. Left internal carotid artery demonstrated opacification. A TICI 2B revascularization was obtained. The balloon guide catheter in the proximal left internal carotid artery, a second pass was made with the 021 microcatheter advanced over an 014 inch soft tip into the M2 M3 region of the superior division. The guidewire was removed. Good aspiration was obtained at the hub of the microcatheter. A 3 mm x 20 mm Solitaire X retrieval device was then advanced to the distal end of the microcatheter, and deployed in the usual manner. Proximal flow arrest in the left internal carotid artery, and constant aspiration applied the hub of the balloon guide catheter with a Penumbra aspiration device, the combination of the retrieval device and the microcatheter were retrieved and removed. Following reversal of flow arrest, a control arteriogram performed through the balloon guide catheter in the proximal left internal carotid artery continued to demonstrate severe stenosis at the bulb. Free flow was noted more distally into the cervical petrous, cavernous and supraclinoid left ICA. The left anterior cerebral artery and the left middle cerebral artery demonstrated improved revascularization with patency of the inferior division. There continued be occlusion of the inferior division posterior parietal branch in the M3 region. A TICI 2C revascularization was obtained. A third pass was then made using a 055 132 cm Zoom aspiration catheter inside of which was an 021 150 cm microcatheter advanced  over an 014 inch soft tip micro guidewire to the M1 segment followed by advancement of the micro guidewire and microcatheter through the occluded M3 region of the parietal left MCA branch. The guidewire was removed. Good aspiration obtained from the hub of the micro catheter. A 4 mm x 40 mm Solitaire X retrieval device was then deployed after having ascertained safe positioning of the tip of the microcatheter. At this time the Zoom aspiration catheter was advanced into the mid M3 segment proximal to the retrieval device. Proximal flow arrest in the left internal carotid artery, and  constant aspiration applied at the hub of the hub of the Zoom aspiration catheter, and the balloon guide catheter in the proximal left ICA, the combination of the retrieval device, the microcatheter and the retrieval device was retrieved and removed. Control arteriogram performed following reversal of flow arrest now demonstrated further improvement in the flow through the probably inferior division into the M4 segment. However, there continued to be 2-3 micro filling defects in the M4 region. A TICI 2C revascularization was maintained though with improved perfusion more distally. An exchange wire over a microcatheter was then advanced through the balloon guide catheter in the proximal left ICA by replacement of the microcatheter with an 014 inch 300 cm exchange micro guidewire. The balloon guide catheter was retrieved just proximal to the left ICA origin. A 4 mm x 20 mm balloon angioplasty microcatheter was then prepped in the usual manner retrogradely and antegradely. This was then advanced using the rapid exchange technique and positioned such that the distal and proximal markers covered the segmental high-grade stenosis of the proximal left ICA. A control inflation was then performed with the balloon being inflated to approximately 4 mm for approximately a minute and a half using micro inflation syringe device via micro tubing.  Balloon was deflated and retrieved and removed. Control arteriogram performed through the balloon guide catheter in the left common carotid artery demonstrated mildly increased caliber of the angioplastied segment. A control arteriogram performed approximately a couple of minutes later demonstrated rerecoil narrowing within the angioplastied segment. A CT of the brain demonstrated a small focus of hyperattenuation in the posterior-lateral putamen surrounded by contrast stain. Bolus dose of IV Cangrelor followed by a 4 hour infusion was then started. The patient had already been loaded with Brilinta 180 mg and aspirin 81 mg after confirmation of the occlusion of the left internal carotid artery at the beginning of the procedure. A 6-8 mm x 40 mm Xact stent apparatus which had been prepped antegradely with 50% contrast and 50% heparinized saline infusion antegradely with heparinized saline infusion was advanced and positioned within the proximal distal from the angioplastied segment. The stent was deployed without any difficulty and the delivery apparatus was removed. Control arteriograms were then performed at 15, 30 and 40 minutes post deployment. These continued to demonstrate progressive occlusion of the stented left internal carotid artery. Control arteriogram through the balloon guide catheter demonstrated progressive occlusion. Antegrade flow was noted in the cavernous and supraclinoid segment of the external carotid collaterals via the ophthalmic artery. Flow was noted into the supraclinoid left ICA and also into the left MCA distribution. The exchange micro guidewire and the balloon guide catheter were retrieved and removed. A diagnostic catheter was then advanced into the right common carotid artery. A 5 French diagnostic catheter was then advanced over an 035 inch Rosen guidewire and positioned in the right common carotid artery and the left vertebral artery. The left common carotid arteriogram demonstrates  approximately 50% stenosis in the origin of the right external carotid artery with patent branches. The right internal carotid artery at the bulb demonstrates approximately 60-65% stenosis without any evidence of ulcerations or of intraluminal filling defects. Free flow was evident to the petrous, the cavernous and the supraclinoid right ICA. The right middle and anterior cerebral arteries opacify into the capillary and venous phases. Prompt cross-filling via the anterior communicating artery of the left anterior cerebral artery A2 segment and distally was noted. Also noted was opacification of the distal left M1 segment. The left  vertebral artery origin and its origin to the cranial skull base. Patency was maintained of the left posterior-inferior cerebellar artery and the left vertebrobasilar junction. The basilar artery, the posterior cerebral arteries, the superior cerebellar arteries and the anterior-inferior cerebellar arteries demonstrate opacification into the capillary and venous phases. Mild caliber of the P1 segments suggest intracranial arteriosclerosis. Also noted was prompt retrograde opacification via the left posterior communicating artery of the left supraclinoid left ICA and the MCA distribution with patency maintained of the superior division, and also the inferior division to the M3 region. Partial retrograde opacification was demonstrated the left PCA P3 pial collaterals. The 8 French Pinnacle sheath was removed. Hemostasis was achieved with an 8 [place] Angio-Seal closure device. Distal pulses remained unchanged bilaterally. A flat panel CT of the brain demonstrated distribution of the previously evaluation probably suggestive of contrast staining rather than hemorrhage. No midline shift was noted. Patient was then intubated because of her medical condition. She was then transferred to the neuro ICU for post revascularization care. IMPRESSION: Status post endovascular revascularization of  occluded left internal carotid artery and the left middle cerebral artery with 2 passes with a 4 mm retrieval device and contact aspiration, and 1 pass with a 3 mm x 20 mm Solitaire X retrieval device with contact aspiration achieving a TICI 2C revascularization. 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 Reocclusion of the stented portion of the left internal carotid due to aggressive platelet response despite use of dual antiplatelets, and intravenous Cangrelor. PLAN: Return to clinic for follow up 4 weeks post discharge. Electronically Signed   By: Luanne Bras M.D.   On: 05/19/2022 10:28   IR INTRAVSC STENT CERV CAROTID W/O EMB-PROT MOD SED  Result Date: 05/19/2022 INDICATION: New onset of global aphasia, right-sided hemiplegia and left-sided neglect. EXAM: 1. EMERGENT LARGE VESSEL OCCLUSION THROMBOLYSIS (anterior CIRCULATION) COMPARISON:  None Available. MEDICATIONS: Ancef 2 g IV antibiotic was administered within 1 hour of the procedure. ANESTHESIA/SEDATION: General anesthesia. CONTRAST:  Omnipaque 300 150 mL. FLUOROSCOPY TIME:  Fluoroscopy Time: 57 minutes 06 seconds (3927 mGy). COMPLICATIONS: None immediate. TECHNIQUE: Following a full explanation of the procedure along with the potential associated complications, an informed witnessed consent was obtained. The risks of intracranial hemorrhage of 10%, worsening neurological deficit, ventilator dependency, death and inability to revascularize were all reviewed in detail with the patient's son. The patient was then put under general anesthesia by the Department of Anesthesiology at [facility]. The right groin was prepped and draped in the usual sterile fashion. Thereafter using modified Seldinger technique, transfemoral access into the right common femoral artery was obtained without difficulty. Over a 0.035 inch guidewire an 8 French 25 cm Pinnacle sheath was inserted.  Through this, and also over a 0.035 inch guidewire a combination of a 5.5 [place] Bernstein support catheter inside of balloon guide catheter was then advanced and positioned in the left common carotid artery. FINDINGS: The left common carotid arteriogram demonstrates the left external carotid artery and its major branches to be widely patent. The left internal carotid artery at the bulb demonstrates complete angiographic occlusion without evidence of a delayed string sign. PROCEDURE: An 014 inch Synchro soft micro guidewire inside of an 021 150 cm microcatheter was advanced to the distal end of the balloon guide catheter. Using a torque device, access was obtained into the distal left internal carotid artery petrous segment followed by the microcatheter. Micro guidewire was advanced into  the distal M2 region of the superior division. The guide wire was removed. Good aspiration was obtained from hub of the microcatheter. A 3 mm x 20 mm was then gently advanced to the supraclinoid left ICA. A gentle control arteriogram performed through this demonstrated occlusion of the superior and the inferior divisions with defects in the M1 segment. The microcatheter was then advanced into the distal MCA inferior division M3 region. The guidewire was removed. Good aspiration was obtained from the hub of the microcatheter. This was then connected to continuous heparinized saline infusion. A 4 mm x 40 mm Solitaire X retrieval device was then advanced to the distal end of the micro catheter. This was then deployed by unsheathing the microcatheter. With proximal flow arrest in the left internal carotid artery at the bulb, constant aspiration was applied at the hub of the balloon guide catheter proximally. After a minute and a half retrieval of the retrieval device of the microcatheter, and the balloon guide catheter into the distal left internal carotid artery. Reversal of flow arrest, a control arteriogram performed through the  balloon guide catheter demonstrated severe stenosis at the bulb. The left internal carotid artery demonstrated caliber irregularity in its mid cervical segment. More distally, the left internal carotid artery demonstrated opacification into the petrous, the cavernous and the supraclinoid segments. The left middle cerebral artery proximally demonstrates wide patency while noted in the superior division and the inferior division in the M2 segment. Left internal carotid artery demonstrated opacification. A TICI 2B revascularization was obtained. The balloon guide catheter in the proximal left internal carotid artery, a second pass was made with the 021 microcatheter advanced over an 014 inch soft tip into the M2 M3 region of the superior division. The guidewire was removed. Good aspiration was obtained at the hub of the microcatheter. A 3 mm x 20 mm Solitaire X retrieval device was then advanced to the distal end of the microcatheter, and deployed in the usual manner. Proximal flow arrest in the left internal carotid artery, and constant aspiration applied the hub of the balloon guide catheter with a Penumbra aspiration device, the combination of the retrieval device and the microcatheter were retrieved and removed. Following reversal of flow arrest, a control arteriogram performed through the balloon guide catheter in the proximal left internal carotid artery continued to demonstrate severe stenosis at the bulb. Free flow was noted more distally into the cervical petrous, cavernous and supraclinoid left ICA. The left anterior cerebral artery and the left middle cerebral artery demonstrated improved revascularization with patency of the inferior division. There continued be occlusion of the inferior division posterior parietal branch in the M3 region. A TICI 2C revascularization was obtained. A third pass was then made using a 055 132 cm Zoom aspiration catheter inside of which was an 021 150 cm microcatheter advanced  over an 014 inch soft tip micro guidewire to the M1 segment followed by advancement of the micro guidewire and microcatheter through the occluded M3 region of the parietal left MCA branch. The guidewire was removed. Good aspiration obtained from the hub of the micro catheter. A 4 mm x 40 mm Solitaire X retrieval device was then deployed after having ascertained safe positioning of the tip of the microcatheter. At this time the Zoom aspiration catheter was advanced into the mid M3 segment proximal to the retrieval device. Proximal flow arrest in the left internal carotid artery, and constant aspiration applied at the hub of the hub of the Zoom aspiration catheter, and  the balloon guide catheter in the proximal left ICA, the combination of the retrieval device, the microcatheter and the retrieval device was retrieved and removed. Control arteriogram performed following reversal of flow arrest now demonstrated further improvement in the flow through the probably inferior division into the M4 segment. However, there continued to be 2-3 micro filling defects in the M4 region. A TICI 2C revascularization was maintained though with improved perfusion more distally. An exchange wire over a microcatheter was then advanced through the balloon guide catheter in the proximal left ICA by replacement of the microcatheter with an 014 inch 300 cm exchange micro guidewire. The balloon guide catheter was retrieved just proximal to the left ICA origin. A 4 mm x 20 mm balloon angioplasty microcatheter was then prepped in the usual manner retrogradely and antegradely. This was then advanced using the rapid exchange technique and positioned such that the distal and proximal markers covered the segmental high-grade stenosis of the proximal left ICA. A control inflation was then performed with the balloon being inflated to approximately 4 mm for approximately a minute and a half using micro inflation syringe device via micro tubing.  Balloon was deflated and retrieved and removed. Control arteriogram performed through the balloon guide catheter in the left common carotid artery demonstrated mildly increased caliber of the angioplastied segment. A control arteriogram performed approximately a couple of minutes later demonstrated rerecoil narrowing within the angioplastied segment. A CT of the brain demonstrated a small focus of hyperattenuation in the posterior-lateral putamen surrounded by contrast stain. Bolus dose of IV Cangrelor followed by a 4 hour infusion was then started. The patient had already been loaded with Brilinta 180 mg and aspirin 81 mg after confirmation of the occlusion of the left internal carotid artery at the beginning of the procedure. A 6-8 mm x 40 mm Xact stent apparatus which had been prepped antegradely with 50% contrast and 50% heparinized saline infusion antegradely with heparinized saline infusion was advanced and positioned within the proximal distal from the angioplastied segment. The stent was deployed without any difficulty and the delivery apparatus was removed. Control arteriograms were then performed at 15, 30 and 40 minutes post deployment. These continued to demonstrate progressive occlusion of the stented left internal carotid artery. Control arteriogram through the balloon guide catheter demonstrated progressive occlusion. Antegrade flow was noted in the cavernous and supraclinoid segment of the external carotid collaterals via the ophthalmic artery. Flow was noted into the supraclinoid left ICA and also into the left MCA distribution. The exchange micro guidewire and the balloon guide catheter were retrieved and removed. A diagnostic catheter was then advanced into the right common carotid artery. A 5 French diagnostic catheter was then advanced over an 035 inch Rosen guidewire and positioned in the right common carotid artery and the left vertebral artery. The left common carotid arteriogram demonstrates  approximately 50% stenosis in the origin of the right external carotid artery with patent branches. The right internal carotid artery at the bulb demonstrates approximately 60-65% stenosis without any evidence of ulcerations or of intraluminal filling defects. Free flow was evident to the petrous, the cavernous and the supraclinoid right ICA. The right middle and anterior cerebral arteries opacify into the capillary and venous phases. Prompt cross-filling via the anterior communicating artery of the left anterior cerebral artery A2 segment and distally was noted. Also noted was opacification of the distal left M1 segment. The left vertebral artery origin and its origin to the cranial skull base. Patency was maintained of  the left posterior-inferior cerebellar artery and the left vertebrobasilar junction. The basilar artery, the posterior cerebral arteries, the superior cerebellar arteries and the anterior-inferior cerebellar arteries demonstrate opacification into the capillary and venous phases. Mild caliber of the P1 segments suggest intracranial arteriosclerosis. Also noted was prompt retrograde opacification via the left posterior communicating artery of the left supraclinoid left ICA and the MCA distribution with patency maintained of the superior division, and also the inferior division to the M3 region. Partial retrograde opacification was demonstrated the left PCA P3 pial collaterals. The 8 French Pinnacle sheath was removed. Hemostasis was achieved with an 8 [place] Angio-Seal closure device. Distal pulses remained unchanged bilaterally. A flat panel CT of the brain demonstrated distribution of the previously evaluation probably suggestive of contrast staining rather than hemorrhage. No midline shift was noted. Patient was then intubated because of her medical condition. She was then transferred to the neuro ICU for post revascularization care. IMPRESSION: Status post endovascular revascularization of  occluded left internal carotid artery and the left middle cerebral artery with 2 passes with a 4 mm retrieval device and contact aspiration, and 1 pass with a 3 mm x 20 mm Solitaire X retrieval device with contact aspiration achieving a TICI 2C revascularization. 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 Reocclusion of the stented portion of the left internal carotid due to aggressive platelet response despite use of dual antiplatelets, and intravenous Cangrelor. PLAN: Return to clinic for follow up 4 weeks post discharge. Electronically Signed   By: Luanne Bras M.D.   On: 05/19/2022 10:28   IR CT [employer]  Result Date: 05/19/2022 INDICATION: New onset of global aphasia, right-sided hemiplegia and left-sided neglect. EXAM: 1. EMERGENT LARGE VESSEL OCCLUSION THROMBOLYSIS (anterior CIRCULATION) COMPARISON:  None Available. MEDICATIONS: Ancef 2 g IV antibiotic was administered within 1 hour of the procedure. ANESTHESIA/SEDATION: General anesthesia. CONTRAST:  Omnipaque 300 150 mL. FLUOROSCOPY TIME:  Fluoroscopy Time: 57 minutes 06 seconds (3927 mGy). COMPLICATIONS: None immediate. TECHNIQUE: Following a full explanation of the procedure along with the potential associated complications, an informed witnessed consent was obtained. The risks of intracranial hemorrhage of 10%, worsening neurological deficit, ventilator dependency, death and inability to revascularize were all reviewed in detail with the patient's son. The patient was then put under general anesthesia by the Department of Anesthesiology at [facility]. The right groin was prepped and draped in the usual sterile fashion. Thereafter using modified Seldinger technique, transfemoral access into the right common femoral artery was obtained without difficulty. Over a 0.035 inch guidewire an 8 French 25 cm Pinnacle sheath was inserted. Through this, and also over a 0.035 inch  guidewire a combination of a 5.5 [place] Bernstein support catheter inside of balloon guide catheter was then advanced and positioned in the left common carotid artery. FINDINGS: The left common carotid arteriogram demonstrates the left external carotid artery and its major branches to be widely patent. The left internal carotid artery at the bulb demonstrates complete angiographic occlusion without evidence of a delayed string sign. PROCEDURE: An 014 inch Synchro soft micro guidewire inside of an 021 150 cm microcatheter was advanced to the distal end of the balloon guide catheter. Using a torque device, access was obtained into the distal left internal carotid artery petrous segment followed by the microcatheter. Micro guidewire was advanced into the distal M2 region of the superior division. The guide wire was removed. Good aspiration was obtained from hub of  the microcatheter. A 3 mm x 20 mm was then gently advanced to the supraclinoid left ICA. A gentle control arteriogram performed through this demonstrated occlusion of the superior and the inferior divisions with defects in the M1 segment. The microcatheter was then advanced into the distal MCA inferior division M3 region. The guidewire was removed. Good aspiration was obtained from the hub of the microcatheter. This was then connected to continuous heparinized saline infusion. A 4 mm x 40 mm Solitaire X retrieval device was then advanced to the distal end of the micro catheter. This was then deployed by unsheathing the microcatheter. With proximal flow arrest in the left internal carotid artery at the bulb, constant aspiration was applied at the hub of the balloon guide catheter proximally. After a minute and a half retrieval of the retrieval device of the microcatheter, and the balloon guide catheter into the distal left internal carotid artery. Reversal of flow arrest, a control arteriogram performed through the balloon guide catheter demonstrated severe  stenosis at the bulb. The left internal carotid artery demonstrated caliber irregularity in its mid cervical segment. More distally, the left internal carotid artery demonstrated opacification into the petrous, the cavernous and the supraclinoid segments. The left middle cerebral artery proximally demonstrates wide patency while noted in the superior division and the inferior division in the M2 segment. Left internal carotid artery demonstrated opacification. A TICI 2B revascularization was obtained. The balloon guide catheter in the proximal left internal carotid artery, a second pass was made with the 021 microcatheter advanced over an 014 inch soft tip into the M2 M3 region of the superior division. The guidewire was removed. Good aspiration was obtained at the hub of the microcatheter. A 3 mm x 20 mm Solitaire X retrieval device was then advanced to the distal end of the microcatheter, and deployed in the usual manner. Proximal flow arrest in the left internal carotid artery, and constant aspiration applied the hub of the balloon guide catheter with a Penumbra aspiration device, the combination of the retrieval device and the microcatheter were retrieved and removed. Following reversal of flow arrest, a control arteriogram performed through the balloon guide catheter in the proximal left internal carotid artery continued to demonstrate severe stenosis at the bulb. Free flow was noted more distally into the cervical petrous, cavernous and supraclinoid left ICA. The left anterior cerebral artery and the left middle cerebral artery demonstrated improved revascularization with patency of the inferior division. There continued be occlusion of the inferior division posterior parietal branch in the M3 region. A TICI 2C revascularization was obtained. A third pass was then made using a 055 132 cm Zoom aspiration catheter inside of which was an 021 150 cm microcatheter advanced over an 014 inch soft tip micro guidewire  to the M1 segment followed by advancement of the micro guidewire and microcatheter through the occluded M3 region of the parietal left MCA branch. The guidewire was removed. Good aspiration obtained from the hub of the micro catheter. A 4 mm x 40 mm Solitaire X retrieval device was then deployed after having ascertained safe positioning of the tip of the microcatheter. At this time the Zoom aspiration catheter was advanced into the mid M3 segment proximal to the retrieval device. Proximal flow arrest in the left internal carotid artery, and constant aspiration applied at the hub of the hub of the Zoom aspiration catheter, and the balloon guide catheter in the proximal left ICA, the combination of the retrieval device, the microcatheter and the retrieval  device was retrieved and removed. Control arteriogram performed following reversal of flow arrest now demonstrated further improvement in the flow through the probably inferior division into the M4 segment. However, there continued to be 2-3 micro filling defects in the M4 region. A TICI 2C revascularization was maintained though with improved perfusion more distally. An exchange wire over a microcatheter was then advanced through the balloon guide catheter in the proximal left ICA by replacement of the microcatheter with an 014 inch 300 cm exchange micro guidewire. The balloon guide catheter was retrieved just proximal to the left ICA origin. A 4 mm x 20 mm balloon angioplasty microcatheter was then prepped in the usual manner retrogradely and antegradely. This was then advanced using the rapid exchange technique and positioned such that the distal and proximal markers covered the segmental high-grade stenosis of the proximal left ICA. A control inflation was then performed with the balloon being inflated to approximately 4 mm for approximately a minute and a half using micro inflation syringe device via micro tubing. Balloon was deflated and retrieved and removed.  Control arteriogram performed through the balloon guide catheter in the left common carotid artery demonstrated mildly increased caliber of the angioplastied segment. A control arteriogram performed approximately a couple of minutes later demonstrated rerecoil narrowing within the angioplastied segment. A CT of the brain demonstrated a small focus of hyperattenuation in the posterior-lateral putamen surrounded by contrast stain. Bolus dose of IV Cangrelor followed by a 4 hour infusion was then started. The patient had already been loaded with Brilinta 180 mg and aspirin 81 mg after confirmation of the occlusion of the left internal carotid artery at the beginning of the procedure. A 6-8 mm x 40 mm Xact stent apparatus which had been prepped antegradely with 50% contrast and 50% heparinized saline infusion antegradely with heparinized saline infusion was advanced and positioned within the proximal distal from the angioplastied segment. The stent was deployed without any difficulty and the delivery apparatus was removed. Control arteriograms were then performed at 15, 30 and 40 minutes post deployment. These continued to demonstrate progressive occlusion of the stented left internal carotid artery. Control arteriogram through the balloon guide catheter demonstrated progressive occlusion. Antegrade flow was noted in the cavernous and supraclinoid segment of the external carotid collaterals via the ophthalmic artery. Flow was noted into the supraclinoid left ICA and also into the left MCA distribution. The exchange micro guidewire and the balloon guide catheter were retrieved and removed. A diagnostic catheter was then advanced into the right common carotid artery. A 5 French diagnostic catheter was then advanced over an 035 inch Rosen guidewire and positioned in the right common carotid artery and the left vertebral artery. The left common carotid arteriogram demonstrates approximately 50% stenosis in the origin of the  right external carotid artery with patent branches. The right internal carotid artery at the bulb demonstrates approximately 60-65% stenosis without any evidence of ulcerations or of intraluminal filling defects. Free flow was evident to the petrous, the cavernous and the supraclinoid right ICA. The right middle and anterior cerebral arteries opacify into the capillary and venous phases. Prompt cross-filling via the anterior communicating artery of the left anterior cerebral artery A2 segment and distally was noted. Also noted was opacification of the distal left M1 segment. The left vertebral artery origin and its origin to the cranial skull base. Patency was maintained of the left posterior-inferior cerebellar artery and the left vertebrobasilar junction. The basilar artery, the posterior cerebral arteries, the superior cerebellar  arteries and the anterior-inferior cerebellar arteries demonstrate opacification into the capillary and venous phases. Mild caliber of the P1 segments suggest intracranial arteriosclerosis. Also noted was prompt retrograde opacification via the left posterior communicating artery of the left supraclinoid left ICA and the MCA distribution with patency maintained of the superior division, and also the inferior division to the M3 region. Partial retrograde opacification was demonstrated the left PCA P3 pial collaterals. The 8 French Pinnacle sheath was removed. Hemostasis was achieved with an 8 [place] Angio-Seal closure device. Distal pulses remained unchanged bilaterally. A flat panel CT of the brain demonstrated distribution of the previously evaluation probably suggestive of contrast staining rather than hemorrhage. No midline shift was noted. Patient was then intubated because of her medical condition. She was then transferred to the neuro ICU for post revascularization care. IMPRESSION: Status post endovascular revascularization of occluded left internal carotid artery and the left  middle cerebral artery with 2 passes with a 4 mm retrieval device and contact aspiration, and 1 pass with a 3 mm x 20 mm Solitaire X retrieval device with contact aspiration achieving a TICI 2C revascularization. 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 Reocclusion of the stented portion of the left internal carotid due to aggressive platelet response despite use of dual antiplatelets, and intravenous Cangrelor. PLAN: Return to clinic for follow up 4 weeks post discharge. Electronically Signed   By: Luanne Bras M.D.   On: 05/19/2022 10:28   IR ANGIO INTRA EXTRACRAN SEL COM CAROTID INNOMINATE UNI R MOD SED  Result Date: 05/19/2022 INDICATION: New onset of global aphasia, right-sided hemiplegia and left-sided neglect. EXAM: 1. EMERGENT LARGE VESSEL OCCLUSION THROMBOLYSIS (anterior CIRCULATION) COMPARISON:  None Available. MEDICATIONS: Ancef 2 g IV antibiotic was administered within 1 hour of the procedure. ANESTHESIA/SEDATION: General anesthesia. CONTRAST:  Omnipaque 300 150 mL. FLUOROSCOPY TIME:  Fluoroscopy Time: 57 minutes 06 seconds (3927 mGy). COMPLICATIONS: None immediate. TECHNIQUE: Following a full explanation of the procedure along with the potential associated complications, an informed witnessed consent was obtained. The risks of intracranial hemorrhage of 10%, worsening neurological deficit, ventilator dependency, death and inability to revascularize were all reviewed in detail with the patient's son. The patient was then put under general anesthesia by the Department of Anesthesiology at [facility]. The right groin was prepped and draped in the usual sterile fashion. Thereafter using modified Seldinger technique, transfemoral access into the right common femoral artery was obtained without difficulty. Over a 0.035 inch guidewire an 8 French 25 cm Pinnacle sheath was inserted. Through this, and also over a 0.035 inch  guidewire a combination of a 5.5 [place] Bernstein support catheter inside of balloon guide catheter was then advanced and positioned in the left common carotid artery. FINDINGS: The left common carotid arteriogram demonstrates the left external carotid artery and its major branches to be widely patent. The left internal carotid artery at the bulb demonstrates complete angiographic occlusion without evidence of a delayed string sign. PROCEDURE: An 014 inch Synchro soft micro guidewire inside of an 021 150 cm microcatheter was advanced to the distal end of the balloon guide catheter. Using a torque device, access was obtained into the distal left internal carotid artery petrous segment followed by the microcatheter. Micro guidewire was advanced into the distal M2 region of the superior division. The guide wire was removed. Good aspiration was obtained from hub of the microcatheter. A 3 mm x 20 mm was then gently advanced  to the supraclinoid left ICA. A gentle control arteriogram performed through this demonstrated occlusion of the superior and the inferior divisions with defects in the M1 segment. The microcatheter was then advanced into the distal MCA inferior division M3 region. The guidewire was removed. Good aspiration was obtained from the hub of the microcatheter. This was then connected to continuous heparinized saline infusion. A 4 mm x 40 mm Solitaire X retrieval device was then advanced to the distal end of the micro catheter. This was then deployed by unsheathing the microcatheter. With proximal flow arrest in the left internal carotid artery at the bulb, constant aspiration was applied at the hub of the balloon guide catheter proximally. After a minute and a half retrieval of the retrieval device of the microcatheter, and the balloon guide catheter into the distal left internal carotid artery. Reversal of flow arrest, a control arteriogram performed through the balloon guide catheter demonstrated severe  stenosis at the bulb. The left internal carotid artery demonstrated caliber irregularity in its mid cervical segment. More distally, the left internal carotid artery demonstrated opacification into the petrous, the cavernous and the supraclinoid segments. The left middle cerebral artery proximally demonstrates wide patency while noted in the superior division and the inferior division in the M2 segment. Left internal carotid artery demonstrated opacification. A TICI 2B revascularization was obtained. The balloon guide catheter in the proximal left internal carotid artery, a second pass was made with the 021 microcatheter advanced over an 014 inch soft tip into the M2 M3 region of the superior division. The guidewire was removed. Good aspiration was obtained at the hub of the microcatheter. A 3 mm x 20 mm Solitaire X retrieval device was then advanced to the distal end of the microcatheter, and deployed in the usual manner. Proximal flow arrest in the left internal carotid artery, and constant aspiration applied the hub of the balloon guide catheter with a Penumbra aspiration device, the combination of the retrieval device and the microcatheter were retrieved and removed. Following reversal of flow arrest, a control arteriogram performed through the balloon guide catheter in the proximal left internal carotid artery continued to demonstrate severe stenosis at the bulb. Free flow was noted more distally into the cervical petrous, cavernous and supraclinoid left ICA. The left anterior cerebral artery and the left middle cerebral artery demonstrated improved revascularization with patency of the inferior division. There continued be occlusion of the inferior division posterior parietal branch in the M3 region. A TICI 2C revascularization was obtained. A third pass was then made using a 055 132 cm Zoom aspiration catheter inside of which was an 021 150 cm microcatheter advanced over an 014 inch soft tip micro guidewire  to the M1 segment followed by advancement of the micro guidewire and microcatheter through the occluded M3 region of the parietal left MCA branch. The guidewire was removed. Good aspiration obtained from the hub of the micro catheter. A 4 mm x 40 mm Solitaire X retrieval device was then deployed after having ascertained safe positioning of the tip of the microcatheter. At this time the Zoom aspiration catheter was advanced into the mid M3 segment proximal to the retrieval device. Proximal flow arrest in the left internal carotid artery, and constant aspiration applied at the hub of the hub of the Zoom aspiration catheter, and the balloon guide catheter in the proximal left ICA, the combination of the retrieval device, the microcatheter and the retrieval device was retrieved and removed. Control arteriogram performed following reversal of flow  arrest now demonstrated further improvement in the flow through the probably inferior division into the M4 segment. However, there continued to be 2-3 micro filling defects in the M4 region. A TICI 2C revascularization was maintained though with improved perfusion more distally. An exchange wire over a microcatheter was then advanced through the balloon guide catheter in the proximal left ICA by replacement of the microcatheter with an 014 inch 300 cm exchange micro guidewire. The balloon guide catheter was retrieved just proximal to the left ICA origin. A 4 mm x 20 mm balloon angioplasty microcatheter was then prepped in the usual manner retrogradely and antegradely. This was then advanced using the rapid exchange technique and positioned such that the distal and proximal markers covered the segmental high-grade stenosis of the proximal left ICA. A control inflation was then performed with the balloon being inflated to approximately 4 mm for approximately a minute and a half using micro inflation syringe device via micro tubing. Balloon was deflated and retrieved and removed.  Control arteriogram performed through the balloon guide catheter in the left common carotid artery demonstrated mildly increased caliber of the angioplastied segment. A control arteriogram performed approximately a couple of minutes later demonstrated rerecoil narrowing within the angioplastied segment. A CT of the brain demonstrated a small focus of hyperattenuation in the posterior-lateral putamen surrounded by contrast stain. Bolus dose of IV Cangrelor followed by a 4 hour infusion was then started. The patient had already been loaded with Brilinta 180 mg and aspirin 81 mg after confirmation of the occlusion of the left internal carotid artery at the beginning of the procedure. A 6-8 mm x 40 mm Xact stent apparatus which had been prepped antegradely with 50% contrast and 50% heparinized saline infusion antegradely with heparinized saline infusion was advanced and positioned within the proximal distal from the angioplastied segment. The stent was deployed without any difficulty and the delivery apparatus was removed. Control arteriograms were then performed at 15, 30 and 40 minutes post deployment. These continued to demonstrate progressive occlusion of the stented left internal carotid artery. Control arteriogram through the balloon guide catheter demonstrated progressive occlusion. Antegrade flow was noted in the cavernous and supraclinoid segment of the external carotid collaterals via the ophthalmic artery. Flow was noted into the supraclinoid left ICA and also into the left MCA distribution. The exchange micro guidewire and the balloon guide catheter were retrieved and removed. A diagnostic catheter was then advanced into the right common carotid artery. A 5 French diagnostic catheter was then advanced over an 035 inch Rosen guidewire and positioned in the right common carotid artery and the left vertebral artery. The left common carotid arteriogram demonstrates approximately 50% stenosis in the origin of the  right external carotid artery with patent branches. The right internal carotid artery at the bulb demonstrates approximately 60-65% stenosis without any evidence of ulcerations or of intraluminal filling defects. Free flow was evident to the petrous, the cavernous and the supraclinoid right ICA. The right middle and anterior cerebral arteries opacify into the capillary and venous phases. Prompt cross-filling via the anterior communicating artery of the left anterior cerebral artery A2 segment and distally was noted. Also noted was opacification of the distal left M1 segment. The left vertebral artery origin and its origin to the cranial skull base. Patency was maintained of the left posterior-inferior cerebellar artery and the left vertebrobasilar junction. The basilar artery, the posterior cerebral arteries, the superior cerebellar arteries and the anterior-inferior cerebellar arteries demonstrate opacification into the capillary and  venous phases. Mild caliber of the P1 segments suggest intracranial arteriosclerosis. Also noted was prompt retrograde opacification via the left posterior communicating artery of the left supraclinoid left ICA and the MCA distribution with patency maintained of the superior division, and also the inferior division to the M3 region. Partial retrograde opacification was demonstrated the left PCA P3 pial collaterals. The 8 French Pinnacle sheath was removed. Hemostasis was achieved with an 8 [place] Angio-Seal closure device. Distal pulses remained unchanged bilaterally. A flat panel CT of the brain demonstrated distribution of the previously evaluation probably suggestive of contrast staining rather than hemorrhage. No midline shift was noted. Patient was then intubated because of her medical condition. She was then transferred to the neuro ICU for post revascularization care. IMPRESSION: Status post endovascular revascularization of occluded left internal carotid artery and the left  middle cerebral artery with 2 passes with a 4 mm retrieval device and contact aspiration, and 1 pass with a 3 mm x 20 mm Solitaire X retrieval device with contact aspiration achieving a TICI 2C revascularization. 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 Reocclusion of the stented portion of the left internal carotid due to aggressive platelet response despite use of dual antiplatelets, and intravenous Cangrelor. PLAN: Return to clinic for follow up 4 weeks post discharge. Electronically Signed   By: Luanne Bras M.D.   On: 05/19/2022 10:28   IR ANGIO VERTEBRAL SEL VERTEBRAL UNI L MOD SED  Result Date: 05/19/2022 INDICATION: New onset of global aphasia, right-sided hemiplegia and left-sided neglect. EXAM: 1. EMERGENT LARGE VESSEL OCCLUSION THROMBOLYSIS (anterior CIRCULATION) COMPARISON:  None Available. MEDICATIONS: Ancef 2 g IV antibiotic was administered within 1 hour of the procedure. ANESTHESIA/SEDATION: General anesthesia. CONTRAST:  Omnipaque 300 150 mL. FLUOROSCOPY TIME:  Fluoroscopy Time: 57 minutes 06 seconds (3927 mGy). COMPLICATIONS: None immediate. TECHNIQUE: Following a full explanation of the procedure along with the potential associated complications, an informed witnessed consent was obtained. The risks of intracranial hemorrhage of 10%, worsening neurological deficit, ventilator dependency, death and inability to revascularize were all reviewed in detail with the patient's son. The patient was then put under general anesthesia by the Department of Anesthesiology at [facility]. The right groin was prepped and draped in the usual sterile fashion. Thereafter using modified Seldinger technique, transfemoral access into the right common femoral artery was obtained without difficulty. Over a 0.035 inch guidewire an 8 French 25 cm Pinnacle sheath was inserted. Through this, and also over a 0.035 inch guidewire a  combination of a 5.5 [place] Bernstein support catheter inside of balloon guide catheter was then advanced and positioned in the left common carotid artery. FINDINGS: The left common carotid arteriogram demonstrates the left external carotid artery and its major branches to be widely patent. The left internal carotid artery at the bulb demonstrates complete angiographic occlusion without evidence of a delayed string sign. PROCEDURE: An 014 inch Synchro soft micro guidewire inside of an 021 150 cm microcatheter was advanced to the distal end of the balloon guide catheter. Using a torque device, access was obtained into the distal left internal carotid artery petrous segment followed by the microcatheter. Micro guidewire was advanced into the distal M2 region of the superior division. The guide wire was removed. Good aspiration was obtained from hub of the microcatheter. A 3 mm x 20 mm was then gently advanced to the supraclinoid left ICA. A gentle control arteriogram performed through this demonstrated occlusion of  the superior and the inferior divisions with defects in the M1 segment. The microcatheter was then advanced into the distal MCA inferior division M3 region. The guidewire was removed. Good aspiration was obtained from the hub of the microcatheter. This was then connected to continuous heparinized saline infusion. A 4 mm x 40 mm Solitaire X retrieval device was then advanced to the distal end of the micro catheter. This was then deployed by unsheathing the microcatheter. With proximal flow arrest in the left internal carotid artery at the bulb, constant aspiration was applied at the hub of the balloon guide catheter proximally. After a minute and a half retrieval of the retrieval device of the microcatheter, and the balloon guide catheter into the distal left internal carotid artery. Reversal of flow arrest, a control arteriogram performed through the balloon guide catheter demonstrated severe stenosis at  the bulb. The left internal carotid artery demonstrated caliber irregularity in its mid cervical segment. More distally, the left internal carotid artery demonstrated opacification into the petrous, the cavernous and the supraclinoid segments. The left middle cerebral artery proximally demonstrates wide patency while noted in the superior division and the inferior division in the M2 segment. Left internal carotid artery demonstrated opacification. A TICI 2B revascularization was obtained. The balloon guide catheter in the proximal left internal carotid artery, a second pass was made with the 021 microcatheter advanced over an 014 inch soft tip into the M2 M3 region of the superior division. The guidewire was removed. Good aspiration was obtained at the hub of the microcatheter. A 3 mm x 20 mm Solitaire X retrieval device was then advanced to the distal end of the microcatheter, and deployed in the usual manner. Proximal flow arrest in the left internal carotid artery, and constant aspiration applied the hub of the balloon guide catheter with a Penumbra aspiration device, the combination of the retrieval device and the microcatheter were retrieved and removed. Following reversal of flow arrest, a control arteriogram performed through the balloon guide catheter in the proximal left internal carotid artery continued to demonstrate severe stenosis at the bulb. Free flow was noted more distally into the cervical petrous, cavernous and supraclinoid left ICA. The left anterior cerebral artery and the left middle cerebral artery demonstrated improved revascularization with patency of the inferior division. There continued be occlusion of the inferior division posterior parietal branch in the M3 region. A TICI 2C revascularization was obtained. A third pass was then made using a 055 132 cm Zoom aspiration catheter inside of which was an 021 150 cm microcatheter advanced over an 014 inch soft tip micro guidewire to the M1  segment followed by advancement of the micro guidewire and microcatheter through the occluded M3 region of the parietal left MCA branch. The guidewire was removed. Good aspiration obtained from the hub of the micro catheter. A 4 mm x 40 mm Solitaire X retrieval device was then deployed after having ascertained safe positioning of the tip of the microcatheter. At this time the Zoom aspiration catheter was advanced into the mid M3 segment proximal to the retrieval device. Proximal flow arrest in the left internal carotid artery, and constant aspiration applied at the hub of the hub of the Zoom aspiration catheter, and the balloon guide catheter in the proximal left ICA, the combination of the retrieval device, the microcatheter and the retrieval device was retrieved and removed. Control arteriogram performed following reversal of flow arrest now demonstrated further improvement in the flow through the probably inferior division into the  M4 segment. However, there continued to be 2-3 micro filling defects in the M4 region. A TICI 2C revascularization was maintained though with improved perfusion more distally. An exchange wire over a microcatheter was then advanced through the balloon guide catheter in the proximal left ICA by replacement of the microcatheter with an 014 inch 300 cm exchange micro guidewire. The balloon guide catheter was retrieved just proximal to the left ICA origin. A 4 mm x 20 mm balloon angioplasty microcatheter was then prepped in the usual manner retrogradely and antegradely. This was then advanced using the rapid exchange technique and positioned such that the distal and proximal markers covered the segmental high-grade stenosis of the proximal left ICA. A control inflation was then performed with the balloon being inflated to approximately 4 mm for approximately a minute and a half using micro inflation syringe device via micro tubing. Balloon was deflated and retrieved and removed. Control  arteriogram performed through the balloon guide catheter in the left common carotid artery demonstrated mildly increased caliber of the angioplastied segment. A control arteriogram performed approximately a couple of minutes later demonstrated rerecoil narrowing within the angioplastied segment. A CT of the brain demonstrated a small focus of hyperattenuation in the posterior-lateral putamen surrounded by contrast stain. Bolus dose of IV Cangrelor followed by a 4 hour infusion was then started. The patient had already been loaded with Brilinta 180 mg and aspirin 81 mg after confirmation of the occlusion of the left internal carotid artery at the beginning of the procedure. A 6-8 mm x 40 mm Xact stent apparatus which had been prepped antegradely with 50% contrast and 50% heparinized saline infusion antegradely with heparinized saline infusion was advanced and positioned within the proximal distal from the angioplastied segment. The stent was deployed without any difficulty and the delivery apparatus was removed. Control arteriograms were then performed at 15, 30 and 40 minutes post deployment. These continued to demonstrate progressive occlusion of the stented left internal carotid artery. Control arteriogram through the balloon guide catheter demonstrated progressive occlusion. Antegrade flow was noted in the cavernous and supraclinoid segment of the external carotid collaterals via the ophthalmic artery. Flow was noted into the supraclinoid left ICA and also into the left MCA distribution. The exchange micro guidewire and the balloon guide catheter were retrieved and removed. A diagnostic catheter was then advanced into the right common carotid artery. A 5 French diagnostic catheter was then advanced over an 035 inch Rosen guidewire and positioned in the right common carotid artery and the left vertebral artery. The left common carotid arteriogram demonstrates approximately 50% stenosis in the origin of the right  external carotid artery with patent branches. The right internal carotid artery at the bulb demonstrates approximately 60-65% stenosis without any evidence of ulcerations or of intraluminal filling defects. Free flow was evident to the petrous, the cavernous and the supraclinoid right ICA. The right middle and anterior cerebral arteries opacify into the capillary and venous phases. Prompt cross-filling via the anterior communicating artery of the left anterior cerebral artery A2 segment and distally was noted. Also noted was opacification of the distal left M1 segment. The left vertebral artery origin and its origin to the cranial skull base. Patency was maintained of the left posterior-inferior cerebellar artery and the left vertebrobasilar junction. The basilar artery, the posterior cerebral arteries, the superior cerebellar arteries and the anterior-inferior cerebellar arteries demonstrate opacification into the capillary and venous phases. Mild caliber of the P1 segments suggest intracranial arteriosclerosis. Also noted was prompt  retrograde opacification via the left posterior communicating artery of the left supraclinoid left ICA and the MCA distribution with patency maintained of the superior division, and also the inferior division to the M3 region. Partial retrograde opacification was demonstrated the left PCA P3 pial collaterals. The 8 French Pinnacle sheath was removed. Hemostasis was achieved with an 8 [place] Angio-Seal closure device. Distal pulses remained unchanged bilaterally. A flat panel CT of the brain demonstrated distribution of the previously evaluation probably suggestive of contrast staining rather than hemorrhage. No midline shift was noted. Patient was then intubated because of her medical condition. She was then transferred to the neuro ICU for post revascularization care. IMPRESSION: Status post endovascular revascularization of occluded left internal carotid artery and the left middle  cerebral artery with 2 passes with a 4 mm retrieval device and contact aspiration, and 1 pass with a 3 mm x 20 mm Solitaire X retrieval device with contact aspiration achieving a TICI 2C revascularization. 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 Reocclusion of the stented portion of the left internal carotid due to aggressive platelet response despite use of dual antiplatelets, and intravenous Cangrelor. PLAN: Return to clinic for follow up 4 weeks post discharge. Electronically Signed   By: Luanne Bras M.D.   On: 05/19/2022 10:28   IR CT Head Ltd  Result Date: 05/19/2022 INDICATION: New onset of global aphasia, right-sided hemiplegia and left-sided neglect. EXAM: 1. EMERGENT LARGE VESSEL OCCLUSION THROMBOLYSIS (anterior CIRCULATION) COMPARISON:  None Available. MEDICATIONS: Ancef 2 g IV antibiotic was administered within 1 hour of the procedure. ANESTHESIA/SEDATION: General anesthesia. CONTRAST:  Omnipaque 300 150 mL. FLUOROSCOPY TIME:  Fluoroscopy Time: 57 minutes 06 seconds (3927 mGy). COMPLICATIONS: None immediate. TECHNIQUE: Following a full explanation of the procedure along with the potential associated complications, an informed witnessed consent was obtained. The risks of intracranial hemorrhage of 10%, worsening neurological deficit, ventilator dependency, death and inability to revascularize were all reviewed in detail with the patient's son. The patient was then put under general anesthesia by the Department of Anesthesiology at [facility]. The right groin was prepped and draped in the usual sterile fashion. Thereafter using modified Seldinger technique, transfemoral access into the right common femoral artery was obtained without difficulty. Over a 0.035 inch guidewire an 8 French 25 cm Pinnacle sheath was inserted. Through this, and also over a 0.035 inch guidewire a combination of a 5.5 [place] Bernstein support  catheter inside of balloon guide catheter was then advanced and positioned in the left common carotid artery. FINDINGS: The left common carotid arteriogram demonstrates the left external carotid artery and its major branches to be widely patent. The left internal carotid artery at the bulb demonstrates complete angiographic occlusion without evidence of a delayed string sign. PROCEDURE: An 014 inch Synchro soft micro guidewire inside of an 021 150 cm microcatheter was advanced to the distal end of the balloon guide catheter. Using a torque device, access was obtained into the distal left internal carotid artery petrous segment followed by the microcatheter. Micro guidewire was advanced into the distal M2 region of the superior division. The guide wire was removed. Good aspiration was obtained from hub of the microcatheter. A 3 mm x 20 mm was then gently advanced to the supraclinoid left ICA. A gentle control arteriogram performed through this demonstrated occlusion of the superior and the inferior divisions with defects in the M1 segment. The microcatheter was then advanced into the distal  MCA inferior division M3 region. The guidewire was removed. Good aspiration was obtained from the hub of the microcatheter. This was then connected to continuous heparinized saline infusion. A 4 mm x 40 mm Solitaire X retrieval device was then advanced to the distal end of the micro catheter. This was then deployed by unsheathing the microcatheter. With proximal flow arrest in the left internal carotid artery at the bulb, constant aspiration was applied at the hub of the balloon guide catheter proximally. After a minute and a half retrieval of the retrieval device of the microcatheter, and the balloon guide catheter into the distal left internal carotid artery. Reversal of flow arrest, a control arteriogram performed through the balloon guide catheter demonstrated severe stenosis at the bulb. The left internal carotid artery  demonstrated caliber irregularity in its mid cervical segment. More distally, the left internal carotid artery demonstrated opacification into the petrous, the cavernous and the supraclinoid segments. The left middle cerebral artery proximally demonstrates wide patency while noted in the superior division and the inferior division in the M2 segment. Left internal carotid artery demonstrated opacification. A TICI 2B revascularization was obtained. The balloon guide catheter in the proximal left internal carotid artery, a second pass was made with the 021 microcatheter advanced over an 014 inch soft tip into the M2 M3 region of the superior division. The guidewire was removed. Good aspiration was obtained at the hub of the microcatheter. A 3 mm x 20 mm Solitaire X retrieval device was then advanced to the distal end of the microcatheter, and deployed in the usual manner. Proximal flow arrest in the left internal carotid artery, and constant aspiration applied the hub of the balloon guide catheter with a Penumbra aspiration device, the combination of the retrieval device and the microcatheter were retrieved and removed. Following reversal of flow arrest, a control arteriogram performed through the balloon guide catheter in the proximal left internal carotid artery continued to demonstrate severe stenosis at the bulb. Free flow was noted more distally into the cervical petrous, cavernous and supraclinoid left ICA. The left anterior cerebral artery and the left middle cerebral artery demonstrated improved revascularization with patency of the inferior division. There continued be occlusion of the inferior division posterior parietal branch in the M3 region. A TICI 2C revascularization was obtained. A third pass was then made using a 055 132 cm Zoom aspiration catheter inside of which was an 021 150 cm microcatheter advanced over an 014 inch soft tip micro guidewire to the M1 segment followed by advancement of the micro  guidewire and microcatheter through the occluded M3 region of the parietal left MCA branch. The guidewire was removed. Good aspiration obtained from the hub of the micro catheter. A 4 mm x 40 mm Solitaire X retrieval device was then deployed after having ascertained safe positioning of the tip of the microcatheter. At this time the Zoom aspiration catheter was advanced into the mid M3 segment proximal to the retrieval device. Proximal flow arrest in the left internal carotid artery, and constant aspiration applied at the hub of the hub of the Zoom aspiration catheter, and the balloon guide catheter in the proximal left ICA, the combination of the retrieval device, the microcatheter and the retrieval device was retrieved and removed. Control arteriogram performed following reversal of flow arrest now demonstrated further improvement in the flow through the probably inferior division into the M4 segment. However, there continued to be 2-3 micro filling defects in the M4 region. A TICI 2C revascularization was  maintained though with improved perfusion more distally. An exchange wire over a microcatheter was then advanced through the balloon guide catheter in the proximal left ICA by replacement of the microcatheter with an 014 inch 300 cm exchange micro guidewire. The balloon guide catheter was retrieved just proximal to the left ICA origin. A 4 mm x 20 mm balloon angioplasty microcatheter was then prepped in the usual manner retrogradely and antegradely. This was then advanced using the rapid exchange technique and positioned such that the distal and proximal markers covered the segmental high-grade stenosis of the proximal left ICA. A control inflation was then performed with the balloon being inflated to approximately 4 mm for approximately a minute and a half using micro inflation syringe device via micro tubing. Balloon was deflated and retrieved and removed. Control arteriogram performed through the balloon guide  catheter in the left common carotid artery demonstrated mildly increased caliber of the angioplastied segment. A control arteriogram performed approximately a couple of minutes later demonstrated rerecoil narrowing within the angioplastied segment. A CT of the brain demonstrated a small focus of hyperattenuation in the posterior-lateral putamen surrounded by contrast stain. Bolus dose of IV Cangrelor followed by a 4 hour infusion was then started. The patient had already been loaded with Brilinta 180 mg and aspirin 81 mg after confirmation of the occlusion of the left internal carotid artery at the beginning of the procedure. A 6-8 mm x 40 mm Xact stent apparatus which had been prepped antegradely with 50% contrast and 50% heparinized saline infusion antegradely with heparinized saline infusion was advanced and positioned within the proximal distal from the angioplastied segment. The stent was deployed without any difficulty and the delivery apparatus was removed. Control arteriograms were then performed at 15, 30 and 40 minutes post deployment. These continued to demonstrate progressive occlusion of the stented left internal carotid artery. Control arteriogram through the balloon guide catheter demonstrated progressive occlusion. Antegrade flow was noted in the cavernous and supraclinoid segment of the external carotid collaterals via the ophthalmic artery. Flow was noted into the supraclinoid left ICA and also into the left MCA distribution. The exchange micro guidewire and the balloon guide catheter were retrieved and removed. A diagnostic catheter was then advanced into the right common carotid artery. A 5 French diagnostic catheter was then advanced over an 035 inch Rosen guidewire and positioned in the right common carotid artery and the left vertebral artery. The left common carotid arteriogram demonstrates approximately 50% stenosis in the origin of the right external carotid artery with patent branches. The  right internal carotid artery at the bulb demonstrates approximately 60-65% stenosis without any evidence of ulcerations or of intraluminal filling defects. Free flow was evident to the petrous, the cavernous and the supraclinoid right ICA. The right middle and anterior cerebral arteries opacify into the capillary and venous phases. Prompt cross-filling via the anterior communicating artery of the left anterior cerebral artery A2 segment and distally was noted. Also noted was opacification of the distal left M1 segment. The left vertebral artery origin and its origin to the cranial skull base. Patency was maintained of the left posterior-inferior cerebellar artery and the left vertebrobasilar junction. The basilar artery, the posterior cerebral arteries, the superior cerebellar arteries and the anterior-inferior cerebellar arteries demonstrate opacification into the capillary and venous phases. Mild caliber of the P1 segments suggest intracranial arteriosclerosis. Also noted was prompt retrograde opacification via the left posterior communicating artery of the left supraclinoid left ICA and the MCA distribution with patency  maintained of the superior division, and also the inferior division to the M3 region. Partial retrograde opacification was demonstrated the left PCA P3 pial collaterals. The 8 French Pinnacle sheath was removed. Hemostasis was achieved with an 8 [place] Angio-Seal closure device. Distal pulses remained unchanged bilaterally. A flat panel CT of the brain demonstrated distribution of the previously evaluation probably suggestive of contrast staining rather than hemorrhage. No midline shift was noted. Patient was then intubated because of her medical condition. She was then transferred to the neuro ICU for post revascularization care. IMPRESSION: Status post endovascular revascularization of occluded left internal carotid artery and the left middle cerebral artery with 2 passes with a 4 mm retrieval  device and contact aspiration, and 1 pass with a 3 mm x 20 mm Solitaire X retrieval device with contact aspiration achieving a TICI 2C revascularization. 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 Reocclusion of the stented portion of the left internal carotid due to aggressive platelet response despite use of dual antiplatelets, and intravenous Cangrelor. PLAN: Return to clinic for follow up 4 weeks post discharge. Electronically Signed   By: Luanne Bras M.D.   On: 05/19/2022 10:28   DG Abd Portable 1V  Result Date: 05/17/2022 CLINICAL DATA:  NG tube placement EXAM: PORTABLE ABDOMEN - 1 VIEW COMPARISON:  None Available. FINDINGS: The NG tube side port and distal tip are in the left upper quadrant, likely in the stomach. IMPRESSION: The NG tube is in good position with the side port and distal tip in the region of the stomach. Electronically Signed   By: Dorise Bullion III M.D.   On: 05/17/2022 09:33   MR BRAIN WO CONTRAST  Result Date: 05/16/2022 CLINICAL DATA:  Stroke, follow-up. Status post reperfusion of left cervical internal carotid artery and left M2 vessels. EXAM: MRI HEAD WITHOUT CONTRAST TECHNIQUE: Multiplanar, multiecho pulse sequences of the brain and surrounding structures were obtained without intravenous contrast. 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 Additional punctate foci of restricted diffusion are present along the watershed distribution. A 5 mm infarct is present medial left thalamus. Distal left ACA territory infarct extends over 4 cm. T2 and FLAIR hyperintensity associated with the areas acute. Additional moderate periventricular and subcortical white matter changes are present bilaterally, left greater than right. Basal ganglia are  within normal. No other significant ischemic changes are present in thalami. The infarcted cortex is swollen with partial effacement of the sulci. The internal auditory canals are within normal limits. Insert normal brainstem Vascular: The left internal carotid artery is occluded below the ophthalmic segment. Signal in the posterior left MCA branch vessels is consistent slow or occluded. Normal flow voids are present in the right internal carotid artery proximal anterior cerebral arteries. Normal flow voids are present posterior circulation. Skull and upper cervical spine: The craniocervical junction is normal. Upper cervical spine is within normal limits. Marrow signal is unremarkable. Sinuses/Orbits: Mild mucosal thickening is present inferior maxillary sinuses bilaterally. Fluid is present in the nasopharynx. Bilateral mastoid effusions are present. Paranasal sinuses are otherwise clear. A right lens replacement is present. Globes and orbits are otherwise within normal limits. IMPRESSION: 1. Acute nonhemorrhagic infarct involving the posterior left MCA territory and posterior left insula. 2. Distal left PCA territory infarct is outside the initial area of ischemic penumbra 3. Additional punctate  foci of acute nonhemorrhagic infarct are present along the watershed distribution. 4. 5 mm acute nonhemorrhagic infarct in the medial left thalamus. 5. Moderate periventricular and subcortical white matter changes bilaterally are moderately advanced for age. This likely reflects the sequela of chronic microvascular ischemia. The above was relayed via text pager to Dr. Leonie Man on 05/16/2022 at 14:19 . Electronically Signed   By: San Morelle M.D.   On: 05/16/2022 16:27   MR ANGIO NECK WO CONTRAST  Result Date: 05/16/2022 CLINICAL DATA:  Status post reperfusion of left ICA occlusion. EXAM: MRA NECK WITHOUT CONTRAST TECHNIQUE: Angiographic images of the neck were acquired using MRA technique without intravenous  contrast. Carotid stenosis measurements (when applicable) are obtained utilizing NASCET criteria, using the distal internal carotid diameter as the denominator. COMPARISON:  Cerebral and carotid angiography 05/15/2022 FINDINGS: Aortic arch: Moderate signal loss is present at the origin of the left subclavian artery. Mild narrowing is present in the proximal left common carotid artery. Right carotid system: The right common carotid artery is within normal limits. Bifurcation is unremarkable. Mild narrowing is present in the proximal right ICA, less severe than suggested on the CTA. The more distal right internal carotid artery is within normal limits. Left carotid system: No flow signal is present in the left internal carotid artery in the neck. Vertebral arteries: The left vertebral artery is the dominant vessel. Flow is antegrade in both vertebral arteries in the neck. Other: None. IMPRESSION: 1. No flow signal in the left internal carotid artery in the neck. The vessel appears to have reoccluded. 2. Mild narrowing of the proximal left common carotid artery. 3. Mild narrowing of the proximal right ICA is less severe than suggested on the CTA. 4. 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 The above was relayed via text pager to Dr. Leonie Man On 05/16/2022 at 14:19 . Electronically Signed   By: San Morelle M.D.   On: 05/16/2022 14:22   MR ANGIO HEAD WO CONTRAST  Result Date: 05/16/2022 CLINICAL DATA:  Left MCA territory infarct. Mechanical thrombectomy and reperfusion. EXAM: MRA HEAD WITHOUT CONTRAST TECHNIQUE: Angiographic images of the Circle of Willis were acquired using MRA technique without intravenous contrast. COMPARISON:  Cerebral arteriogram 05/15/2022 FINDINGS: Anterior circulation: No flow is present in the left internal carotid proximal to the ICA terminus. Flow is reconstituted via a left posterior communicating artery. Flow is present in the left M1 segment. The anterior left  M2 segment and branch vessels are visualized. Right internal carotid artery demonstrates mild atherosclerotic changes within the cavernous segment without significant stenosis greater than 50%. The right A1 and M1 segments are normal. Anterior communicating artery is patent. The right ACA and MCA branch vessels are within normal limits. There is some attenuation distal left ACA branch vessels. Posterior circulation: The left vertebral artery is the dominant vessel. The vertebrobasilar junction basilar artery is normal. Prominent AICA vessels are present. Both posterior cerebral arteries originate from basilar tip. Moderate stenosis is present left P2 segment with asymmetric attenuation distal left PCA branches. Anatomic variants: None IMPRESSION: 1. No flow in the left internal carotid artery proximal to the ICA terminus. Flow is reconstituted via a left posterior communicating artery. 2. The left anterior communicating artery is patent. 3. Moderate stenosis of the left P2 segment with asymmetric attenuation distal left PCA branch vessels. Electronically Signed   By: San Morelle M.D.   On: 05/16/2022 14:17   ECHOCARDIOGRAM COMPLETE  Result Date: 05/16/2022    ECHOCARDIOGRAM  REPORT   Patient Name:   Blanka Rockholt Date of Exam: 05/16/2022 Medical Rec #:  [id number]           Height:       65.0 in Accession #:    [id number]          Weight:       191.8 lb Date of Birth:  01-14-1956           BSA:          1.943 m Patient Age:    79 years            BP:           124/45 mmHg Patient Gender: F                   HR:           78 bpm. Exam Location:  Inpatient Procedure: 2D Echo, Cardiac Doppler and Color Doppler Indications:    Stroke  History:        Patient has no prior history of Echocardiogram examinations.                 Respiratory failure requiring mechanical ventilation.  Sonographer:    [name] RDCS Referring Phys: Lorenza Chick  Sonographer Comments: Echo performed with patient supine  and on artificial respirator. IMPRESSIONS  1. Left ventricular ejection fraction, by estimation, is 65 to 70%. The left ventricle has normal function. The left ventricle has no regional wall motion abnormalities. Left ventricular diastolic parameters are indeterminate.  2. Right ventricular systolic function is normal. The right ventricular size is normal.  3. Left atrial size was mildly dilated.  4. The mitral valve is abnormal. Trivial mitral valve regurgitation. No evidence of mitral stenosis.  5. The aortic valve is grossly normal. Aortic valve regurgitation is trivial. No aortic stenosis is present. Comparison(s): No prior Echocardiogram. Conclusion(s)/Recommendation(s): Otherwise normal echocardiogram, with minor abnormalities described in the report. No clear cardiac source of embolism. Consider TEE once clinically stable. FINDINGS  Left Ventricle: Left ventricular ejection fraction, by estimation, is 65 to 70%. The left ventricle has normal function. The left ventricle has no regional wall motion abnormalities. The left ventricular internal cavity size was normal in size. There is  borderline left ventricular hypertrophy. Left ventricular diastolic parameters are indeterminate. Right Ventricle: The right ventricular size is normal. Right vetricular wall thickness was not well visualized. Right ventricular systolic function is normal. Left Atrium: Left atrial size was mildly dilated. Right Atrium: Right atrial size was normal in size. Pericardium: There is no evidence of pericardial effusion. Mitral Valve: The mitral valve is abnormal. There is mild thickening of the mitral valve leaflet(s). There is moderate calcification of the mitral valve leaflet(s). Trivial mitral valve regurgitation. No evidence of mitral valve stenosis. Tricuspid Valve: The tricuspid valve is grossly normal. Tricuspid valve regurgitation is trivial. No evidence of tricuspid stenosis. Aortic Valve: The aortic valve is grossly normal.  Aortic valve regurgitation is trivial. No aortic stenosis is present. Aortic valve mean gradient measures 5.0 mmHg. Aortic valve peak gradient measures 10.4 mmHg. Aortic valve area, by VTI measures 2.50 cm. Pulmonic Valve: The pulmonic valve was not well visualized. Pulmonic valve regurgitation is not visualized. No evidence of pulmonic stenosis. Aorta: The aortic root, ascending aorta, aortic arch and descending aorta are all structurally normal, with no evidence of dilitation or obstruction. Venous: IVC assessment for right atrial pressure unable to be performed due to mechanical  ventilation. IAS/Shunts: The atrial septum is grossly normal.  LEFT VENTRICLE PLAX 2D LVIDd:         4.00 cm   Diastology LVIDs:         2.40 cm   LV e' medial:    5.55 cm/s LV PW:         1.20 cm   LV E/e' medial:  13.9 LV IVS:        1.10 cm   LV e' lateral:   8.05 cm/s LVOT diam:     2.10 cm   LV E/e' lateral: 9.6 LV SV:         78 LV SV Index:   40 LVOT Area:     3.46 cm  RIGHT VENTRICLE             IVC RV Basal diam:  3.20 cm     IVC diam: 2.00 cm RV S prime:     14.10 cm/s TAPSE (M-mode): 2.2 cm LEFT ATRIUM             Index        RIGHT ATRIUM           Index LA diam:        3.90 cm 2.01 cm/m   RA Area:     15.10 cm LA Vol (A2C):   57.0 ml 29.33 ml/m  RA Volume:   34.10 ml  17.55 ml/m LA Vol (A4C):   58.1 ml 29.90 ml/m LA Biplane Vol: 59.5 ml 30.62 ml/m  AORTIC VALVE AV Area (Vmax):    2.52 cm AV Area (Vmean):   2.48 cm AV Area (VTI):     2.50 cm AV Vmax:           161.00 cm/s AV Vmean:          106.000 cm/s AV VTI:            0.313 m AV Peak Grad:      10.4 mmHg AV Mean Grad:      5.0 mmHg LVOT Vmax:         117.00 cm/s LVOT Vmean:        75.900 cm/s LVOT VTI:          0.226 m LVOT/AV VTI ratio: 0.72  AORTA Ao Root diam: 3.10 cm MITRAL VALVE MV Area (PHT): 3.17 cm     SHUNTS MV Decel Time: 239 msec     Systemic VTI:  0.23 m MV E velocity: 77.40 cm/s   Systemic Diam: 2.10 cm MV A velocity: 108.00 cm/s MV E/A ratio:   0.72 Buford Dresser MD Electronically signed by Buford Dresser MD Signature Date/Time: 05/16/2022/1:16:18 PM    Final    CT HEAD WO CONTRAST (5MM)  Result Date: 05/16/2022 CLINICAL DATA:  Stroke follow-up EXAM: CT HEAD WITHOUT CONTRAST TECHNIQUE: Contiguous axial images were obtained from the base of the skull through the vertex without intravenous contrast. RADIATION DOSE REDUCTION: This exam was performed according to the departmental dose-optimization program which includes automated exposure control, adjustment of the mA and/or kV according to patient size and/or use of iterative reconstruction technique. COMPARISON:  Flat plate CT from yesterday FINDINGS: Brain: Cytotoxic edema appearance in the low left parietal and posterior temporal cortex. High-density in the deep white matter on the left, more faintly seen at the left putamen, some combination of hemorrhage and contrast staining, non progressed. No hydrocephalus. Vascular: No focal hyperdensity. Skull: No acute finding Sinuses/Orbits: No acute finding IMPRESSION: 1. Contrast  staining versus hemorrhage without mass effect, stable from intraprocedural CT yesterday. 2.  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 Electronically Signed   By: Jorje Guild M.D.   On: 05/16/2022 05:09   CT ANGIO HEAD NECK W WO CM W PERF (CODE STROKE)  Result Date: 05/15/2022 CLINICAL DATA:  Right-sided weakness.  Left MCA territory infarcts. EXAM: CT ANGIOGRAPHY HEAD AND NECK CT PERFUSION BRAIN TECHNIQUE: Multidetector CT imaging of the head and neck was performed using the standard protocol during bolus administration of intravenous contrast. Multiplanar CT image reconstructions and MIPs were obtained to evaluate the vascular anatomy. Carotid stenosis measurements (when applicable) are obtained utilizing NASCET criteria, using the distal internal carotid diameter as the denominator. Multiphase CT imaging of the brain was performed following IV  bolus contrast injection. Subsequent parametric perfusion maps were calculated using RAPID software. RADIATION DOSE REDUCTION: This exam was performed according to the departmental dose-optimization program which includes automated exposure control, adjustment of the mA and/or kV according to patient size and/or use of iterative reconstruction technique. CONTRAST:  142m OMNIPAQUE IOHEXOL 350 MG/ML SOLN COMPARISON:  CT HEAD without contrast 05/15/2022 9:34 P.M. FINDINGS: CTA NECK FINDINGS Aortic arch: Atherosclerotic changes are present the aortic arch and great vessel origins. No significant stenosis or aneurysm is present. Narrowing of less than 50% is present at the origin of the left common carotid artery due to noncalcified plaque. Right carotid system: Right common carotid artery is within normal limits. Calcified and noncalcified plaque bifurcation results in narrowing of the proximal right ICA. The lumen is narrowed to 1.5 mm. This compares with a more normal distal segment of 4 mm just below the skull base. There is some irregularity in the mid right ICA. Left carotid system: The left common carotid artery is within normal limits beyond the proximal segment. The left internal carotid artery is occluded without significant reconstitution in the neck. Vertebral arteries: The left vertebral artery is the dominant vessel. The right vertebral artery is occluded proximally, reconstituted at the level of C6. Additional segmental narrowing is present in the right P2 segment. The more distal right vertebral artery is more normal. No significant stenosis is present in the dominant left vertebral artery. Skeleton: Mild degenerative changes are present in the lower cervical spine. No focal osseous lesions are present. Alignment is anatomic. Straightening of the normal cervical lordosis is present. Other neck: Soft tissues the neck are otherwise unremarkable. Salivary glands are within normal limits. Thyroid is normal.  No significant adenopathy is present. No focal mucosal or submucosal lesions are present. Upper chest: The lung apices are clear. Thoracic inlet is within normal limits. Review of the MIP images confirms the above findings CTA HEAD FINDINGS Anterior circulation: Atherosclerotic calcifications are present within the cavernous right ICA. Narrowing of less than 50% is noted relative to the more distal normal ICA terminus. The left cavernous ICA is reconstituted above the skull base. Dense calcifications are present in the anterior genu. There is some flow in the left ICA terminus. The right M1 and A1 segments are normal. The anterior communicating artery is patent. The left A1 segment is narrowed. Mild narrowing is present in the proximal left M1 segment. Contrast opacification is present in the distal left M1 segment. The right MCA branch vessels and ACA branch vessels are within normal limits. Anterior MCA branch vessels are intact. The posterior left M2 segment is occluded minimal collateral vessels are present. Posterior circulation: The left vertebral artery is dominant vessel. PICA origins are  visualized and normal. Vertebrobasilar junction and basilar artery normal. Both posterior cerebral arteries originate from the basilar tip. Right PCA vessels within normal limits. High-grade stenosis or occlusion is present in the left P2 segment. Distal branch vessels are partially reconstituted. Venous sinuses: The dural sinuses are patent. The straight sinus deep cerebral veins are intact. Cortical veins are within normal limits. No significant vascular malformation is evident. Anatomic variants: None Review of the MIP images confirms the above findings CT Brain Perfusion Findings: ASPECTS: 6/10 CBF (<30%) Volume: 71m Perfusion (Tmax>6.0s) volume: 1257mMismatch Volume: 6180mnfarction Location:Left posterior MCA territory These results were called by telephone at the time of interpretation on 05/15/2022 at 9:50 Pm to  provider [facility]who verbally acknowledged these results. IMPRESSION: 1. 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 2. The posterior left M2 segment is occluded with poor collaterals. 3. 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 4. High-grade stenosis or occlusion of the left P2 segment. 5. The left A1 segment is narrowed. 6. Atherosclerotic changes within the cavernous right ICA without significant stenosis. 7. Narrowing of less than 50% at the origin of the left common carotid artery. 8. 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 9. Aortic Atherosclerosis (ICD10-I70.0). These results were called by telephone at the time of interpretation on 05/15/2022 at 9:50pm to provider [facility]who verb[facility] these results. Electronically Signed   By: ChrSan MorelleD.   On: 05/15/2022 22:17   CT HEAD CODE STROKE WO CONTRAST  Addendum Date: 05/15/2022   ADDENDUM REPORT: 05/15/2022 21:49 ADDENDUM: After discussion with Dr. BhaCurly Shorespects is 6/10 within additional point deducted at the ganglionic level. Electronically Signed   By: ChrSan MorelleD.   On: 05/15/2022 21:49   Result Date: 05/15/2022 CLINICAL DATA:  Code stroke. Left MCA syndrome. Last known well 2 days ago. EXAM: CT HEAD WITHOUT CONTRAST TECHNIQUE: Contiguous axial images were obtained from the base of the skull through the vertex without intravenous contrast. RADIATION DOSE REDUCTION: This exam was performed according to the departmental dose-optimization program which includes automated exposure control, adjustment of the mA and/or kV according to patient size and/or use of iterative reconstruction technique. COMPARISON:  CT head without contrast 05/15/2022 at 5:23 p.m. FINDINGS: Brain: Interval progression of loss  of gray-white differentiation involves the posterior left insular cortex the posterior left superior temporal gyrus and left parietal lobe. Sulci are effaced. No acute hemorrhage is present. Basal ganglia are intact. Scattered subcortical T2 hyperintensities are otherwise stable. The brainstem and cerebellum are within normal limits. Vascular: Atherosclerotic calcifications are present within the cavernous internal carotid arteries bilaterally. Hyperdense left M2 branches are noted. Skull: Calvarium is intact. No focal lytic or blastic lesions are present. No significant extracranial soft tissue lesion is present. Sinuses/Orbits: Right lens replacement is present. Globes and orbits are otherwise within normal limits. The paranasal sinuses and mastoid air cells are clear. ASPECTS ([facility] Early CT Score) - Ganglionic level infarction (caudate, lentiform nuclei, internal capsule, insula, M1-M3 cortex): 5/7 - Supraganglionic infarction (M4-M6 cortex): 2/3 Total score (0-10 with 10 being normal): 7/10 IMPRESSION: 1. Interval progression of loss of gray-white differentiation involving the posterior left insular cortex the posterior left superior temporal gyrus and left parietal lobe compatible with acute/subacute infarct. 2. Hyperdense left M2 branches compatible with acute  thrombus. 3. ASPECTS is 7/10. 4. Stable atrophy and white matter disease. This likely reflects the sequela of chronic microvascular ischemia. The above was relayed via text pager to Dr. Lesleigh Noe on 05/15/2022 at 21:43 . Electronically Signed: By: San Morelle M.D. On: 05/15/2022 21:43   DG Chest Portable 1 View  Result Date: 05/15/2022 CLINICAL DATA:  Altered mental status EXAM: PORTABLE CHEST 1 VIEW COMPARISON:  Chest x-ray dated June 23, 2021 FINDINGS: The heart size and mediastinal contours are within normal limits. Both lungs are clear. The visualized skeletal structures are unremarkable. IMPRESSION: No active  disease. Electronically Signed   By: Yetta Glassman M.D.   On: 05/15/2022 17:47   CT HEAD WO CONTRAST (5MM)  Result Date: 05/15/2022 CLINICAL DATA:  Neuro deficit. Suspected stroke. RIGHT-sided weakness. RIGHT-sided facial droop. EXAM: CT HEAD WITHOUT CONTRAST TECHNIQUE: Contiguous axial images were obtained from the base of the skull through the vertex without intravenous contrast. RADIATION DOSE REDUCTION: This exam was performed according to the departmental dose-optimization program which includes automated exposure control, adjustment of the mA and/or kV according to patient size and/or use of iterative reconstruction technique. COMPARISON:  None Available. FINDINGS: Brain: There is central and cortical atrophy. Minimal focal areas of periventricular white matter changes are consistent with small vessel disease. There is no intra or extra-axial fluid collection or mass lesion. The basilar cisterns and ventricles have a normal appearance. There is no CT evidence for acute infarction or hemorrhage. Vascular: No hyperdense vessel or unexpected calcification. Skull: Normal. Negative for fracture or focal lesion. Sinuses/Orbits: No acute finding. Other: None. IMPRESSION: No evidence for acute intracranial abnormality. Electronically Signed   By: Nolon Nations M.D.   On: 05/15/2022 17:23      HISTORY OF PRESENT ILLNESS  Farida Mcreynolds is a 66 y.o. female with past medical history significant for hypertension, hyperlipidemia, diabetes, tobacco abuse. She last talked to family several days before admission.  Symptom discovery was at 2 PM on 8/26, and she was triaged in the ED at 4:41 PM.  Case was discussed with neurology by ED provider and code stroke was not activated due to unknown last known well.  Initial head CT was read with no acute intracranial process, initial labs were all highly abnormal   Her initial examination for ED provider was notable for fluctuating right-sided weakness.  She was  making repetitive statements ("I can't talk") and not following commands   On my review of the case, initial head CT was concerning for hyperdense left MCA vessel sign with aspects of 10 and therefore I assessed the patient for potential intra-arterial thrombectomy emergently  HOSPITAL COURSE Ms. Veda Arrellano is a 66 y.o. female with history of HTN, hyperlipedmia, DM2, tobacco abuse presenting with waxing and waning stroke symptoms.  She last talked to family several days before admission.  Symptom discovery was at 2 PM on 8/26. Initial head CT was read with no acute intracranial process. Her initial examination for ED provider was notable for fluctuating right-sided weakness.  She was making repetitive statements ("I can't talk") and not following commands. She was taken to IR emergently for a mechanical thrombectomy and stent assisted angioplasty of the left ICA. Remained intubated post procedure. MRI and MRA show reocclusion of stent. Extubated 8/27.    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 Rescue left proximal ICA stent which  was occluded, etiology likely due to large vessel disease Code Stroke CT head No acute abnormality. Small vessel disease. Atrophy. ASPECTS 10.    CTA head & neck - The posterior left M2 segment is occluded with poor collaterals. 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 High-grade stenosis or occlusion of the left P2 segment.  CT perfusion Core 42m, Perfusion 1263m Mismatch 61 MRI-  acute infarct involving the posterior left MCA territory and posterior left insula. Additional punctate foci of acute nonhemorrhagic infarct are present along the watershed distribution. 29m38mcute nonhemorrhagic infarct in the medial left thalamus MRA  No flow in the  left ICA. Mod stenosis of the left P2  2D Echo EF 65-70% LDL 25 HgbA1c 7.2 UDS negative VTE prophylaxis - lovenox No antithrombotic prior to admission, now on aspirin 81 mg daily and Brilinta (ticagrelor) 90 mg bid.  Therapy recommendations: SNF disposition:  pending and awaiting SNF Placement    Carotid stenosis CTA head & neck - High-grade stenosis or occlusion of the left internal carotid artery at the bifurcation. Narrowing of less than 50% at the origin of the left common carotid artery. Calcified and noncalcified plaque bifurcation results in narrowing of the proximal right ICA. The lumen is narrowed to 1.5 mm. This compares with a more normal distal segment of 4 mm just below the skull base. Resulting 60% stenosis   Hypertension Home meds:  lisinopril Stable on the low end Now on lisinopril 40 D/c amlodipine 5 mg qday BP <180/105 Long-term BP goal 130-150 given left ICA occlusion   Hyperlipidemia Home meds:  Atorvastatin '40mg'$  LDL 25, goal < 70 Lipitor 40 mg resumed Continue statin at discharge   Diabetes type II Uncontrolled Home meds:  Metformin  HgbA1c 7.2, goal < 7.0 CBGs SSI On insulin [place] 24 U daily Close PCP follow up for better Dm control   UTI Leukocytosis WBC 13.8-> 13.3-> 14.4->14.7> 13.6->13.5  UA WBC > 50, urine culture Staph [place] CXR neg treat with Rocephin, will change to po Keflex on discharge (ordered Keflex 500 mg po q 12 hous x 2 days) afebrile   Dysphagia SLP on board Now on diet with pure and thin liquid and followed closely Cortrak removed   Tobacco abuse Current smoker Smoking cessation counseling will be provided   Other Stroke Risk Factors Advanced Age >/= 65 49besity, Body mass index is 30.63 kg/m., BMI >/= 30 associated with increased stroke risk, recommend weight loss, diet and exercise as appropriate    Other Active Problems Anemia 10.7 stable   DISCHARGE EXAM Blood pressure (!) 137/50, pulse 92,  temperature 98.3 F (36.8 C), resp. rate 18, height '5\' 5"'$  (1.651 m), weight 86.6 kg, SpO2 98 %.  Physical Exam  Constitutional: Obese middle-age African-American female who is not in distress Respiratory: Effort normal, non-labored breathing   Neuro: Mental Status: Eyes open.  Globally aphasic with left gaze deviation, does not track . Non verbal.She is trying to communicate and did shake her head to questions Cranial Nerves: II: PERRL, 3mm52mactive III,IV, VI: No ptosis or diploplia. Left gaze deviation, does not cross midline. Does not blink to threat on right  XI: Head turned to the left Motor: Tone is normal. Bulk is normal. Spontaneous movement noted on the left.  Trace movement on the right with flaccid hemiplegia, will move RLE to noxious stimuli.  Will not move RUE. No  spontaneous movement seen  seen on right side   Discharge Diet       Diet   DIET - DYS 1 Room service appropriate? Yes; Fluid consistency: Thin   liquids  DISCHARGE PLAN Disposition:  She is being admitted to skilled Nursing Facility aspirin 81 mg daily for secondary stroke prevention  plus Ticagrelor 90 mg po q 12 hours Ongoing stroke risk factor control by Primary Care Physician at time of discharge Follow-up PCP Copland, Gay Filler, MD in 2 weeks. Follow-up in [facility] in 4 weeks, office to schedule an appointment.   35 minutes were spent preparing discharge.   Elenora Gamma , PA-C  ATTENDING NOTE: I reviewed above note and agree with the assessment and plan. Pt was seen and examined.   Neuro unchanged, no acute event overnight. Vital sign stable. SNF placement today. Will follow up at [facility] in 4 weeks.   For detailed assessment and plan, please refer to above/below as I have made changes wherever appropriate.   Rosalin Hawking, MD PhD Stroke Neurology 05/28/2022 2:55 PM

## 2022-05-28 NOTE — TOC Transition Note (Signed)
Transition of Care [facility]) - CM/SW Discharge Note   Patient Details  Name: Bailey [name] MRN: [id number] Date of Birth: [birth year]  Transition [facility] Care [facility]) CM/SW Contact:  Geralynn Ochs, LCSW Phone Number: 05/28/2022, 3:09 PM   Clinical Narrative:   CSW updated by patient's son that he would like to choose [facility]. CSW confirmed with [facility] that they had bed availability, and CSW obtained insurance authorization. Patient's son in agreement with transfer to SNF today. Transport arranged with [facility] for next available.  Nurse to call report to [phone], Room 117A.    Final next level of care: Skilled Nursing Facility Barriers to Discharge: Barriers Resolved   Patient Goals and CMS Choice Patient states their goals for this hospitalization and ongoing recovery are:: patient unable to participate in goal setting, not fully oriented CMS Medicare.gov Compare Post Acute Care list provided to:: Patient Choice offered to / list presented to : Adult Children, Sibling  Discharge Placement              Patient [facility] bed at: [facility] Patient to be transferred to facility by: [place] Name of family member notified: Darrero Patient and family notified of of transfer: 05/28/22  Discharge Plan and Services     Post Acute Care Choice: [place]                               Social Determinants of Health (SDOH) Interventions     Readmission Risk Interventions     No data to display

## 2022-05-28 NOTE — Progress Notes (Addendum)
Speech Language Pathology Treatment: Dysphagia;Cognitive-Linquistic  Patient Details Name: Bailey [name] MRN: [id number] DOB: 1955-10-30 Today's Date: 05/28/2022 Time: 9983-3825 SLP Time Calculation (min) (ACUTE ONLY): 26 min  Assessment / Plan / Recommendation Clinical Impression  Pt seen for a dysphagia tx f/u visit for potential progression to upgraded consistency with mod verbal/tactile cues to clear R buccal area when consuming regular consistency with impaired mastication noted and mild lingual residue noted post-intake.  Pt min impulsive with straw sips of thin with min verbal cues/guided sips given to decrease aspiration risk, although no overt s/s of aspiration noted throughout trial.  Pt experienced anterior loss with liquids when removed from oral cavity given impulsivity with larger volumes.  Continue Dysphagia 1/thin with full supervision and assistance with self-feeding d/t decreased safety awareness and inattention.     Aphasia tx included mod-max A for voicing with pt able to hum accurate tune with SLP A given common songs/automatic tasks with pt verbalizing "mm-hmm" when asked various questions, although accuracy impacted by perseveration/decreased ability to indicate yes/no accurately when asked simple personal questions.  Pt became emotionally labile when birthday was discussed and initiation of Happy Birthday song began during session.  Discussed goals and encouragement provided with pt nodding in agreement.  Attention/intent appears improved overall with non-verbal communication and eye contact improved from prior sessions.  Pt would repeat unintelligible response consistently when attempting to communicate with SLP throughout tx session.  Continued ST recommended for potential upgraded diet and aphasia tx while in acute setting.   HPI HPI: Pt is a 66 y.o. female who presented to the ED via EMS for AMS and R side weakness.  CTA revealed large L MCA infarct. Pt underwent  endovascular revascularization of occluded left internal carotid artery artery and left middle cerebral artery M1 8/26. Intubated 8/25 and self extubated 8/26. PMH:  HTN, hyperlipidemia, diabetes, tobacco abuse.      SLP Plan  Continue with current plan of care      Recommendations for follow up therapy are one component of a multi-disciplinary discharge planning process, led by the attending physician.  Recommendations may be updated based on patient status, additional functional criteria and insurance authorization.    Recommendations  Diet recommendations: Dysphagia 1 (puree);Thin liquid Liquids provided via: Cup;Straw Medication Administration: Whole meds with puree Supervision: Staff to assist with self feeding;Full supervision/cueing for compensatory strategies Compensations: Slow rate;Small sips/bites                Oral Care Recommendations: Oral care BID;Staff/trained caregiver to provide oral care Follow Up Recommendations: Skilled nursing-short term rehab (<3 hours/day) Assistance recommended at discharge: Frequent or constant Supervision/Assistance SLP Visit Diagnosis: Aphasia (R47.01);Dysphagia, oropharyngeal phase (R13.12) Plan: Continue with current plan of care           Elvina Sidle, M.S., CCC-SLP  05/28/2022, 12:44 PM

## 2022-05-28 NOTE — Plan of Care (Signed)
Problem: Education: Goal: Understanding of CV disease, CV risk reduction, and recovery process will improve Outcome: Progressing Goal: Individualized Educational Video(s) Outcome: Progressing   Problem: Activity: Goal: Ability to return to baseline activity level will improve Outcome: Progressing   Problem: Cardiovascular: Goal: Ability to achieve and maintain adequate cardiovascular perfusion will improve Outcome: Progressing Goal: Vascular access site(s) Level 0-1 will be maintained Outcome: Progressing   Problem: Health Behavior/Discharge Planning: Goal: Ability to safely manage health-related needs after discharge will improve Outcome: Progressing   Problem: Education: Goal: Individualized Educational Video(s) Outcome: Progressing   Problem: Coping: Goal: Ability to adjust to condition or change in health will improve Outcome: Progressing   Problem: Fluid Volume: Goal: Ability to maintain a balanced intake and output will improve Outcome: Progressing   Problem: Health Behavior/Discharge Planning: Goal: Ability to identify and utilize available resources and services will improve Outcome: Progressing Goal: Ability to manage health-related needs will improve Outcome: Progressing   Problem: Metabolic: Goal: Ability to maintain appropriate glucose levels will improve Outcome: Progressing   Problem: Nutritional: Goal: Maintenance of adequate nutrition will improve Outcome: Progressing Goal: Progress toward achieving an optimal weight will improve Outcome: Progressing   Problem: Skin Integrity: Goal: Risk for impaired skin integrity will decrease Outcome: Progressing   Problem: Tissue Perfusion: Goal: Adequacy of tissue perfusion will improve Outcome: Progressing   Problem: Education: Goal: Knowledge of General Education information will improve Description: Including pain rating scale, medication(s)/side effects and non-pharmacologic comfort  measures Outcome: Progressing   Problem: Health Behavior/Discharge Planning: Goal: Ability to manage health-related needs will improve Outcome: Progressing   Problem: Clinical Measurements: Goal: Ability to maintain clinical measurements within normal limits will improve Outcome: Progressing Goal: Will remain free from infection Outcome: Progressing Goal: Diagnostic test results will improve Outcome: Progressing Goal: Respiratory complications will improve Outcome: Progressing Goal: Cardiovascular complication will be avoided Outcome: Progressing   Problem: Activity: Goal: Risk for activity intolerance will decrease Outcome: Progressing   Problem: Nutrition: Goal: Adequate nutrition will be maintained Outcome: Progressing   Problem: Elimination: Goal: Will not experience complications related to bowel motility Outcome: Progressing Goal: Will not experience complications related to urinary retention Outcome: Progressing   Problem: Pain Managment: Goal: General experience of comfort will improve Outcome: Progressing   Problem: Safety: Goal: Ability to remain free from injury will improve Outcome: Progressing   Problem: Skin Integrity: Goal: Risk for impaired skin integrity will decrease Outcome: Progressing   Problem: Education: Goal: Knowledge of disease or condition will improve Outcome: Progressing Goal: Knowledge of secondary prevention will improve (SELECT ALL) Outcome: Progressing Goal: Knowledge of patient specific risk factors will improve (INDIVIDUALIZE FOR PATIENT) Outcome: Progressing Goal: Individualized Educational Video(s) Outcome: Progressing   Problem: Coping: Goal: Will identify appropriate support needs Outcome: Progressing   Problem: Health Behavior/Discharge Planning: Goal: Ability to manage health-related needs will improve Outcome: Progressing   Problem: Self-Care: Goal: Ability to participate in self-care as condition permits will  improve Outcome: Progressing   Problem: Nutrition: Goal: Risk of aspiration will decrease Outcome: Progressing Goal: Dietary intake will improve Outcome: Progressing   Problem: Ischemic Stroke/TIA Tissue Perfusion: Goal: Complications of ischemic stroke/TIA will be minimized Outcome: Progressing   Problem: Education: Goal: Ability to describe self-care measures that may prevent or decrease complications (Diabetes Survival Skills Education) will improve Outcome: Not Progressing   Problem: Coping: Goal: Will verbalize positive feelings about self Outcome: Not Progressing   Problem: Self-Care: Goal: Verbalization of feelings and concerns over difficulty with self-care will improve Outcome: Not  Progressing Goal: Ability to communicate needs accurately will improve Outcome: Not Progressing   Problem: Coping: Goal: Level of anxiety will decrease Outcome: Completed/Met

## 2022-05-28 NOTE — Progress Notes (Signed)
Report called to nurse at [facility].

## 2022-05-28 NOTE — Progress Notes (Signed)
Occupational Therapy Treatment Patient Details Name: Bailey [name] MRN: [id number] DOB: Mar 27, 1956 Today's Date: 05/28/2022   History of present illness 66 yo female presenting 8/25 with R lateral lean, perseverative speech, and unable to follow commands. CTA revealed L MCA infarct. Pt underwent endovascular revascularization of occluded left internal carotid artery artery and left middle cerebral artery M1 8/26. Intubated 8/25 and self extubated 8/26. PMH includes: HTN, HLD, DM II, and tobacco use.   OT comments  Patient received in supine and required max assist to get to EOB.  Patient required min assist to min guard assist to maintain sitting balance. Grooming attempted with face washing and brushing hair with patient requiring hand over hand assist to perform. Patient instructed on sitting balance tasks with difficulty following directions. Patient was max assist for stand pivot transfer to recliner and positioned for comfort and RUE. Acute OT to continue to follow.    Recommendations for follow up therapy are one component of a multi-disciplinary discharge planning process, led by the attending physician.  Recommendations may be updated based on patient status, additional functional criteria and insurance authorization.    Follow Up Recommendations  Skilled nursing-short term rehab (<3 hours/day)    Assistance Recommended at Discharge Frequent or constant Supervision/Assistance  Patient can return home with the following  A lot of help with walking and/or transfers;A lot of help with bathing/dressing/bathroom;Assistance with cooking/housework;Direct supervision/assist for medications management;Assistance with feeding;Direct supervision/assist for financial management;Assist for transportation;Help with stairs or ramp for entrance   Equipment Recommendations  Other (comment) (defer to next venue)    Recommendations for Other Services      Precautions / Restrictions  Precautions Precautions: Fall;Other (comment) Precaution Comments: R hemiparesis, R inattention, cortrak, Restrictions Weight Bearing Restrictions: No       Mobility Bed Mobility Overal bed mobility: Needs Assistance Bed Mobility: Rolling, Sidelying to Sit Rolling: Max assist Sidelying to sit: Max assist       General bed mobility comments: bed pad used to assist    Transfers Overall transfer level: Needs assistance Equipment used: None Transfers: Sit to/from Stand, Bed to chair/wheelchair/BSC Sit to Stand: Max assist Stand pivot transfers: Max assist         General transfer comment: face to face technique for stand pivot transfer to recliner     Balance Overall balance assessment: Needs assistance Sitting-balance support: Feet supported, Single extremity supported Sitting balance-Leahy Scale: Poor Sitting balance - Comments: min assist to min guard assist for sitting balance. Attempted reaching tasks with patient having difficulty following directions Postural control: Right lateral lean Standing balance support: Bilateral upper extremity supported Standing balance-Leahy Scale: Poor Standing balance comment: reliant on therapist to stand                           ADL either performed or assessed with clinical judgement   ADL Overall ADL's : Needs assistance/impaired     Grooming: Maximal assistance;Wash/dry face;Brushing hair;Sitting Grooming Details (indicate cue type and reason): hand over hand to performed seated on EOB                               General ADL Comments: difficulty following directions for grooming tasks    Extremity/Trunk Assessment Upper Extremity Assessment RUE Deficits / Details: no active movement noted. Able to move through full PROM RUE Sensation: decreased light touch;decreased proprioception RUE Coordination: decreased fine motor;decreased gross  motor LUE Deficits / Details: moving against gravity  throguhout full range - difficult to fully assess due to impiared communication/cognition            Vision       Perception     Praxis      Cognition Arousal/Alertness: Awake/alert Behavior During Therapy: Flat affect Overall Cognitive Status: Difficult to assess Area of Impairment: Attention, Following commands, Safety/judgement, Awareness, Problem solving                   Current Attention Level: Focused Memory: Decreased short-term memory Following Commands: Follows one step commands inconsistently, Follows one step commands with increased time Safety/Judgement: Decreased awareness of safety, Decreased awareness of deficits Awareness: Intellectual Problem Solving: Slow processing, Decreased initiation, Difficulty sequencing, Requires verbal cues, Requires tactile cues General Comments: required multimodal cues for all tasks, hand over hand to perform grooming tasks.  Patient with aphasia        Exercises      Shoulder Instructions       General Comments      Pertinent Vitals/ Pain       Pain Assessment Pain Assessment: Faces Faces Pain Scale: No hurt Pain Intervention(s): Monitored during session  Home Living                                          Prior Functioning/Environment              Frequency  Min 2X/week        Progress Toward Goals  OT Goals(current goals can now be found in the care plan section)  Progress towards OT goals: Progressing toward goals  Acute Rehab OT Goals OT Goal Formulation: Patient unable to participate in goal setting Time For Goal Achievement: 05/31/22 Potential to Achieve Goals: Good ADL Goals Pt Will Perform Grooming: with mod assist;sitting Pt Will Transfer to Toilet: with mod assist;with +2 assist;stand pivot transfer;bedside commode Additional ADL Goal #1: Pt will locate 2/5 ADLs objects at midline with Min cues. Additional ADL Goal #2: Pt will demonstrate sustained attention  during ADLs with Min cues  Plan Discharge plan needs to be updated    Co-evaluation                 AM-PAC OT "6 Clicks" Daily Activity     Outcome Measure   Help from another person eating meals?: Total Help from another person taking care of personal grooming?: Total Help from another person toileting, which includes using toliet, bedpan, or urinal?: Total Help from another person bathing (including washing, rinsing, drying)?: Total Help from another person to put on and taking off regular upper body clothing?: Total Help from another person to put on and taking off regular lower body clothing?: Total 6 Click Score: 6    End of Session Equipment Utilized During Treatment: Gait belt  OT Visit Diagnosis: Unsteadiness on feet (R26.81);Other abnormalities of gait and mobility (R26.89);Muscle weakness (generalized) (M62.81);Cognitive communication deficit (R41.841);Hemiplegia and hemiparesis Symptoms and signs involving cognitive functions: Cerebral infarction Hemiplegia - Right/Left: Right Hemiplegia - caused by: Cerebral infarction   Activity Tolerance Patient tolerated treatment well   Patient Left in chair;with call bell/phone within reach;with chair alarm set   Nurse Communication Mobility status        Time: 3790-2409 OT Time Calculation (min): 22 min  Charges: OT General Charges $OT Visit: 1 Visit  OT Treatments $Therapeutic Activity: 8-22 mins  Lodema Hong, OTA Acute Rehabilitation Services  Office [phone]   Trixie Dredge 05/28/2022, 10:46 AM

## 2022-05-28 NOTE — TOC Progression Note (Signed)
Transition of Care [facility]) - Progression Note    Patient Details  Name: Bailey [name] MRN: [id number] Date of Birth: 02-26-1956  Transition [facility] Care [facility]) CM/SW [place], [place] Phone Number: 05/28/2022, 3:11 PM  Clinical Narrative:   CSW spoke with patient's son to discuss SNF offers. Patient's son had spent the morning at the courthouse trying to obtain guardianship, and CSW answered his questions about that process. Patient's son reviewing SNF options, going to [place] this afternoon and will update CSW with choice.    Expected Discharge Plan: Skilled Nursing Facility Barriers to Discharge: Ship broker, Continued Medical Work up  Expected Discharge Plan and Services Expected Discharge Plan: [place] Choice: [place] arrangements for the past 2 months: Single Family Home Expected Discharge Date: 05/28/22                                     Social Determinants of Health (SDOH) Interventions    Readmission Risk Interventions     No data to display

## 2022-05-29 DIAGNOSIS — D649 Anemia, unspecified: Secondary | ICD-10-CM | POA: Diagnosis not present

## 2022-05-29 DIAGNOSIS — I6930 Unspecified sequelae of cerebral infarction: Secondary | ICD-10-CM | POA: Diagnosis not present

## 2022-05-29 DIAGNOSIS — I69320 Aphasia following cerebral infarction: Secondary | ICD-10-CM | POA: Diagnosis not present

## 2022-05-29 DIAGNOSIS — E1140 Type 2 diabetes mellitus with diabetic neuropathy, unspecified: Secondary | ICD-10-CM | POA: Diagnosis not present

## 2022-05-29 DIAGNOSIS — K219 Gastro-esophageal reflux disease without esophagitis: Secondary | ICD-10-CM | POA: Diagnosis not present

## 2022-05-29 DIAGNOSIS — I69391 Dysphagia following cerebral infarction: Secondary | ICD-10-CM | POA: Diagnosis not present

## 2022-05-29 DIAGNOSIS — I10 Essential (primary) hypertension: Secondary | ICD-10-CM | POA: Diagnosis not present

## 2022-05-29 DIAGNOSIS — E785 Hyperlipidemia, unspecified: Secondary | ICD-10-CM | POA: Diagnosis not present

## 2022-06-02 DIAGNOSIS — D649 Anemia, unspecified: Secondary | ICD-10-CM | POA: Diagnosis not present

## 2022-06-02 DIAGNOSIS — E785 Hyperlipidemia, unspecified: Secondary | ICD-10-CM | POA: Diagnosis not present

## 2022-06-02 DIAGNOSIS — I10 Essential (primary) hypertension: Secondary | ICD-10-CM | POA: Diagnosis not present

## 2022-06-02 DIAGNOSIS — E118 Type 2 diabetes mellitus with unspecified complications: Secondary | ICD-10-CM | POA: Diagnosis not present

## 2022-06-03 DIAGNOSIS — R2689 Other abnormalities of gait and mobility: Secondary | ICD-10-CM | POA: Diagnosis not present

## 2022-06-03 DIAGNOSIS — G8321 Monoplegia of upper limb affecting right dominant side: Secondary | ICD-10-CM | POA: Diagnosis not present

## 2022-06-03 DIAGNOSIS — I69398 Other sequelae of cerebral infarction: Secondary | ICD-10-CM | POA: Diagnosis not present

## 2022-06-03 DIAGNOSIS — R451 Restlessness and agitation: Secondary | ICD-10-CM | POA: Diagnosis not present

## 2022-06-03 DIAGNOSIS — F411 Generalized anxiety disorder: Secondary | ICD-10-CM | POA: Diagnosis not present

## 2022-06-03 DIAGNOSIS — R4189 Other symptoms and signs involving cognitive functions and awareness: Secondary | ICD-10-CM | POA: Diagnosis not present

## 2022-06-03 DIAGNOSIS — I69320 Aphasia following cerebral infarction: Secondary | ICD-10-CM | POA: Diagnosis not present

## 2022-06-03 DIAGNOSIS — I6930 Unspecified sequelae of cerebral infarction: Secondary | ICD-10-CM | POA: Diagnosis not present

## 2022-06-04 DIAGNOSIS — I69391 Dysphagia following cerebral infarction: Secondary | ICD-10-CM | POA: Diagnosis not present

## 2022-06-04 DIAGNOSIS — D649 Anemia, unspecified: Secondary | ICD-10-CM | POA: Diagnosis not present

## 2022-06-04 DIAGNOSIS — E1140 Type 2 diabetes mellitus with diabetic neuropathy, unspecified: Secondary | ICD-10-CM | POA: Diagnosis not present

## 2022-06-04 DIAGNOSIS — I6930 Unspecified sequelae of cerebral infarction: Secondary | ICD-10-CM | POA: Diagnosis not present

## 2022-06-04 DIAGNOSIS — I10 Essential (primary) hypertension: Secondary | ICD-10-CM | POA: Diagnosis not present

## 2022-06-04 DIAGNOSIS — I69320 Aphasia following cerebral infarction: Secondary | ICD-10-CM | POA: Diagnosis not present

## 2022-06-04 DIAGNOSIS — E785 Hyperlipidemia, unspecified: Secondary | ICD-10-CM | POA: Diagnosis not present

## 2022-06-04 DIAGNOSIS — K219 Gastro-esophageal reflux disease without esophagitis: Secondary | ICD-10-CM | POA: Diagnosis not present

## 2022-06-05 DIAGNOSIS — R1110 Vomiting, unspecified: Secondary | ICD-10-CM | POA: Diagnosis not present

## 2022-06-05 DIAGNOSIS — F411 Generalized anxiety disorder: Secondary | ICD-10-CM | POA: Diagnosis not present

## 2022-06-05 DIAGNOSIS — R451 Restlessness and agitation: Secondary | ICD-10-CM | POA: Diagnosis not present

## 2022-06-05 DIAGNOSIS — I69320 Aphasia following cerebral infarction: Secondary | ICD-10-CM | POA: Diagnosis not present

## 2022-06-05 DIAGNOSIS — I69391 Dysphagia following cerebral infarction: Secondary | ICD-10-CM | POA: Diagnosis not present

## 2022-06-08 DIAGNOSIS — N898 Other specified noninflammatory disorders of vagina: Secondary | ICD-10-CM | POA: Diagnosis not present

## 2022-06-09 DIAGNOSIS — I951 Orthostatic hypotension: Secondary | ICD-10-CM | POA: Diagnosis not present

## 2022-06-09 DIAGNOSIS — R509 Fever, unspecified: Secondary | ICD-10-CM | POA: Diagnosis not present

## 2022-06-09 DIAGNOSIS — I69391 Dysphagia following cerebral infarction: Secondary | ICD-10-CM | POA: Diagnosis not present

## 2022-06-10 DIAGNOSIS — F411 Generalized anxiety disorder: Secondary | ICD-10-CM | POA: Diagnosis not present

## 2022-06-10 DIAGNOSIS — G8321 Monoplegia of upper limb affecting right dominant side: Secondary | ICD-10-CM | POA: Diagnosis not present

## 2022-06-10 DIAGNOSIS — I69398 Other sequelae of cerebral infarction: Secondary | ICD-10-CM | POA: Diagnosis not present

## 2022-06-10 DIAGNOSIS — I951 Orthostatic hypotension: Secondary | ICD-10-CM | POA: Diagnosis not present

## 2022-06-10 DIAGNOSIS — R2689 Other abnormalities of gait and mobility: Secondary | ICD-10-CM | POA: Diagnosis not present

## 2022-06-10 DIAGNOSIS — R4189 Other symptoms and signs involving cognitive functions and awareness: Secondary | ICD-10-CM | POA: Diagnosis not present

## 2022-06-10 DIAGNOSIS — I69320 Aphasia following cerebral infarction: Secondary | ICD-10-CM | POA: Diagnosis not present

## 2022-06-11 DIAGNOSIS — D649 Anemia, unspecified: Secondary | ICD-10-CM | POA: Diagnosis not present

## 2022-06-11 DIAGNOSIS — I6930 Unspecified sequelae of cerebral infarction: Secondary | ICD-10-CM | POA: Diagnosis not present

## 2022-06-11 DIAGNOSIS — F411 Generalized anxiety disorder: Secondary | ICD-10-CM | POA: Diagnosis not present

## 2022-06-11 DIAGNOSIS — I951 Orthostatic hypotension: Secondary | ICD-10-CM | POA: Diagnosis not present

## 2022-06-15 DIAGNOSIS — R4589 Other symptoms and signs involving emotional state: Secondary | ICD-10-CM | POA: Diagnosis not present

## 2022-06-15 DIAGNOSIS — I951 Orthostatic hypotension: Secondary | ICD-10-CM | POA: Diagnosis not present

## 2022-06-15 DIAGNOSIS — F411 Generalized anxiety disorder: Secondary | ICD-10-CM | POA: Diagnosis not present

## 2022-06-16 DIAGNOSIS — I951 Orthostatic hypotension: Secondary | ICD-10-CM | POA: Diagnosis not present

## 2022-06-16 DIAGNOSIS — F411 Generalized anxiety disorder: Secondary | ICD-10-CM | POA: Diagnosis not present

## 2022-06-16 DIAGNOSIS — I69320 Aphasia following cerebral infarction: Secondary | ICD-10-CM | POA: Diagnosis not present

## 2022-06-18 DIAGNOSIS — R2689 Other abnormalities of gait and mobility: Secondary | ICD-10-CM | POA: Diagnosis not present

## 2022-06-18 DIAGNOSIS — G8321 Monoplegia of upper limb affecting right dominant side: Secondary | ICD-10-CM | POA: Diagnosis not present

## 2022-06-18 DIAGNOSIS — R4189 Other symptoms and signs involving cognitive functions and awareness: Secondary | ICD-10-CM | POA: Diagnosis not present

## 2022-06-18 DIAGNOSIS — I69398 Other sequelae of cerebral infarction: Secondary | ICD-10-CM | POA: Diagnosis not present

## 2022-06-19 DIAGNOSIS — I6930 Unspecified sequelae of cerebral infarction: Secondary | ICD-10-CM | POA: Diagnosis not present

## 2022-06-19 DIAGNOSIS — I951 Orthostatic hypotension: Secondary | ICD-10-CM | POA: Diagnosis not present

## 2022-06-22 DIAGNOSIS — R55 Syncope and collapse: Secondary | ICD-10-CM | POA: Diagnosis not present

## 2022-06-22 DIAGNOSIS — I6930 Unspecified sequelae of cerebral infarction: Secondary | ICD-10-CM | POA: Diagnosis not present

## 2022-06-24 DIAGNOSIS — F410 Panic disorder [episodic paroxysmal anxiety] without agoraphobia: Secondary | ICD-10-CM | POA: Diagnosis not present

## 2022-06-24 DIAGNOSIS — F411 Generalized anxiety disorder: Secondary | ICD-10-CM | POA: Diagnosis not present

## 2022-06-24 DIAGNOSIS — I69320 Aphasia following cerebral infarction: Secondary | ICD-10-CM | POA: Diagnosis not present

## 2022-06-25 ENCOUNTER — Telehealth (HOSPITAL_COMMUNITY): Payer: Self-pay

## 2022-06-25 ENCOUNTER — Other Ambulatory Visit (HOSPITAL_COMMUNITY): Payer: Self-pay | Admitting: Interventional Radiology

## 2022-06-25 DIAGNOSIS — F411 Generalized anxiety disorder: Secondary | ICD-10-CM | POA: Diagnosis not present

## 2022-06-25 DIAGNOSIS — F410 Panic disorder [episodic paroxysmal anxiety] without agoraphobia: Secondary | ICD-10-CM | POA: Diagnosis not present

## 2022-06-25 DIAGNOSIS — I6522 Occlusion and stenosis of left carotid artery: Secondary | ICD-10-CM

## 2022-06-25 DIAGNOSIS — I69320 Aphasia following cerebral infarction: Secondary | ICD-10-CM | POA: Diagnosis not present

## 2022-06-25 NOTE — Telephone Encounter (Signed)
Called to schedule 4 wk f/u, no answer, number in system not working. AW

## 2022-06-29 ENCOUNTER — Inpatient Hospital Stay: Payer: Medicare HMO | Admitting: Adult Health

## 2022-07-01 NOTE — Progress Notes (Deleted)
Patient: Bailey [name] Date of Birth: [birth year]  Reason for Visit: Follow up History from: Patient Primary Neurologist:    ASSESSMENT AND PLAN 66 y.o. year old female   21.  Stroke: Left MCA, ACA, MCA/PCA infarcts due to left ICA, M2, P2 occlusion; status post IR of occluded left ICA, M2, P2 with TICI2c.  Rescue left proximal ICA stent which was occluded, etiology likely due to large vessel disease. -Continue aspirin 81 mg daily, Brilinta 90 mg twice a day  2.  Carotid stenosis -Stent assisted angioplasty of the left ICA, MRA showed reocclusion of stent -On aspirin 81 mg daily, Brilinta 90 mg twice a day -Needs to follow up with IR   3.  Hypertension -Long-term BP goal 130-150 given left ICA occlusion  4.  Hyperlipidemia -LDL 25, goal less than 70 -On Lipitor 40  5.  Type 2 diabetes -A1c 7.2, goal less than 7.0  6.  Tobacco abuse  Hospitalization complicated by: UTI, dysphagia  HISTORY OF PRESENT ILLNESS: Today 07/01/22 Bailey [name] is here for stroke clinic follow-up.  Presented 8/25 to the ER for right-sided weakness.  Taken to IR emergently for mechanical thrombectomy and stent assisted angioplasty of the left ICA.  MRI and MRA showed reocclusion of stent.  -CT code stroke head no acute abnormality.  There was small vessel disease, atrophy. -CTA head and neck posterior left M2 segment is occluded with poor collaterals.  High-grade stenosis or occlusion of the left internal carotid artery at the bifurcation with reconstitution in the left cavernous segment likely from the ophthalmic artery.  High-grade stenosis or occlusion of the left P2 segment -CT perfusion core 62 ml, perfusion 123 ml, mismatch 61 -MRI of the brain acute infarct involving the posterior left MCA territory and posterior left insula. Additional punctate foci of acute nonhemorrhagic infarct are present along the watershed distribution. 53m acute nonhemorrhagic infarct in the medial left  thalamus -MRA head and neck no flow in the left ICA.  Moderate stenosis of the left P2 -2D echo EF 65 to 70% -LDL 25 -A1c 7.2 -UDS negative -No antithrombotic prior to admission, now aspirin 81 mg daily and Brilinta 90 mg twice daily   HISTORY  Copied 05/15/22 Dr. BCurly ShoresHPI: Bailey [name] a 67y.o. female with past medical history significant for hypertension, hyperlipidemia, diabetes, tobacco abuse.   She last talked to family several days before admission.  Symptom discovery was at 2 PM on 8/26, and she was triaged in the ED at 4:41 PM.  Case was discussed with neurology by ED provider and code stroke was not activated due to unknown last known well.  Initial head CT was read with no acute intracranial process, initial labs were all highly abnormal   Her initial examination for ED provider was notable for fluctuating right-sided weakness.  She was making repetitive statements ("I can't talk") and not following commands   On my review of the case, initial head CT was concerning for hyperdense left MCA vessel sign with aspects of 10 and therefore I assessed the patient for potential intra-arterial thrombectomy emergently   LKW: 2 days prior to admission tPA given?: No, out of the window IA performed?:  Yes, delayed due to being out of the window, and initial labs demonstrating multiple metabolic abnormalities, initial waxing and waning symptoms leading to delayed neurology evaluation with advanced imaging demonstrating favorable profile for intervention Premorbid modified rankin scale: 0-1     0 - No symptoms.  1 - No significant disability. Able to carry out all usual activities, despite some symptoms.  REVIEW OF SYSTEMS: Out of a complete 14 system review of symptoms, the patient complains only of the following symptoms, and all other reviewed systems are negative.  See HPI  ALLERGIES: No Known Allergies  HOME MEDICATIONS: Outpatient Medications Prior to Visit   Medication Sig Dispense Refill   acetaminophen (TYLENOL) 500 MG tablet Take 1,000 mg by mouth every 6 (six) hours as needed (pain). Reported on 04/08/2016     albuterol (VENTOLIN HFA) 108 (90 Base) MCG/ACT inhaler Inhale 2 puffs into the lungs every 6 (six) hours as needed for wheezing or shortness of breath. 1 each 3   alendronate (FOSAMAX) 70 MG tablet Take 1 tablet (70 mg total) by mouth every 7 (seven) days. Take with a full glass of water on an empty stomach. 12 tablet 3   aspirin 81 MG chewable tablet Chew 1 tablet (81 mg total) by mouth daily. 90 tablet 1   atorvastatin (LIPITOR) 40 MG tablet TAKE 1 TABLET EVERY DAY 90 tablet 1   Boric Acid Vaginal (AZO BORIC ACID) 600 MG SUPP Place 1 suppository vaginally at bedtime. 14 suppository 0   butalbital-acetaminophen-caffeine (FIORICET) 50-325-40 MG tablet Take 1-2 tablets by mouth every 6 (six) hours as needed for headache. Max 6 per day 30 tablet 0   cholestyramine (QUESTRAN) 4 g packet DISSOLVE & TAKE 1 POWDER PACKET BY MOUTH TWICE DAILY 180 packet 3   cyclobenzaprine (FLEXERIL) 5 MG tablet Take 1 tablet (5 mg total) by mouth at bedtime. Use as needed for neck pain 30 tablet 1   insulin aspart (NOVOLOG) 100 UNIT/ML injection Inject 4 Units into the skin 3 (three) times daily with meals. 10 mL 11   insulin glargine-yfgn (SEMGLEE) 100 UNIT/ML injection Inject 0.24 mLs (24 Units total) into the skin daily. 10 mL 11   lisinopril (ZESTRIL) 40 MG tablet Take 1 tablet (40 mg total) by mouth daily. 90 tablet 3   Multiple Vitamin (MULTIVITAMIN WITH MINERALS) TABS tablet Take 1 tablet by mouth daily. 30 tablet 0   pantoprazole (PROTONIX) 40 MG tablet Take 1 tablet (40 mg total) by mouth 2 (two) times daily. 180 tablet 1   ticagrelor (BRILINTA) 90 MG TABS tablet Take 1 tablet (90 mg total) by mouth 2 (two) times daily. 60 tablet 1   triamcinolone cream (KENALOG) 0.1 % Apply 1 application topically 2 (two) times daily. Use as needed on dry or scaly areas 60  g 1   No facility-administered medications prior to visit.    PAST MEDICAL HISTORY: Past Medical History:  Diagnosis Date   Arthritis    Cataract    COPD (chronic obstructive pulmonary disease) ([place])    Diabetes mellitus without complication ([place])    Fibrocystic breast disease July 2016   GERD (gastroesophageal reflux disease)    Hyperlipidemia    Hypertension     PAST SURGICAL HISTORY: Past Surgical History:  Procedure Laterality Date   BREAST BIOPSY Right 04/19/2015   benign   BREAST BIOPSY Right 11/01/2020   fibroadenoma   CATARACT EXTRACTION     CHOLECYSTECTOMY     COLONOSCOPY     GALLBLADDER SURGERY     IR ANGIO INTRA EXTRACRAN SEL COM CAROTID INNOMINATE UNI R MOD SED  05/16/2022   IR ANGIO VERTEBRAL SEL VERTEBRAL UNI L MOD SED  05/16/2022   IR CT HEAD LTD  05/16/2022   IR CT HEAD LTD  05/16/2022  IR CT HEAD LTD  05/16/2022   IR INTRAVSC STENT CERV CAROTID W/O EMB-PROT MOD SED INC ANGIO  05/16/2022   IR PERCUTANEOUS ART THROMBECTOMY/INFUSION INTRACRANIAL INC DIAG ANGIO  05/16/2022   RADIOLOGY WITH ANESTHESIA N/A 05/15/2022   Procedure: IR WITH ANESTHESIA;  Surgeon: Radiologist, Medication, MD;  Location: [facility];  Service: Radiology;  Laterality: N/A;   UTERINE FIBROID SURGERY      FAMILY HISTORY: Family History  Problem Relation Age of Onset   Diabetes Mother    Stroke Mother    Dementia Mother    Heart disease Father    Diabetes Father    Diabetes Brother    Dementia Brother    Renal Disease Brother    Diabetes Sister    Narcolepsy Sister    Arthritis Sister    Diabetes Sister    Obesity Sister    Diabetes Brother    Heart disease Brother    Cancer Brother        pancreatic   Diabetes Brother    Diabetes Brother    Stomach cancer Sister    Colon cancer Neg Hx    Esophageal cancer Neg Hx    Rectal cancer Neg Hx     SOCIAL HISTORY: Social History   Socioeconomic History   Marital status: Widowed    Spouse name: Not on file   Number of children: 2    Years of education: 14   Highest education level: Associate degree: occupational, Hotel manager, or vocational program  Occupational History   Occupation: Habilitation tech  Tobacco Use   Smoking status: Every Day    Packs/day: 1.00    Years: 34.00    Total pack years: 34.00    Types: Cigarettes    Passive exposure: Current   Smokeless tobacco: Never   Tobacco comments:    Tobacco info given 10/11/15  Vaping Use   Vaping Use: Never used  Substance and Sexual Activity   Alcohol use: Yes    Comment: socially   Drug use: No   Sexual activity: Not Currently    Birth control/protection: None  Other Topics Concern   Not on file  Social History Narrative   Not on file   Social Determinants of Health   Financial Resource Strain: Low Risk  (10/23/2021)   Overall Financial Resource Strain (CARDIA)    Difficulty of Paying Living Expenses: Not hard at all  Food Insecurity: No Food Insecurity (10/23/2021)   Hunger Vital Sign    Worried About Running Out of Food in the Last Year: Never true    [place] in the Last Year: Never true  Transportation Needs: No Transportation Needs (10/23/2021)   PRAPARE - Hydrologist (Medical): No    Lack of Transportation (Non-Medical): No  Physical Activity: Inactive (10/23/2021)   Exercise Vital Sign    Days of Exercise per Week: 0 days    Minutes of Exercise per Session: 0 min  Stress: Stress Concern Present (10/23/2021)   Why    Feeling of Stress : Rather much  Social Connections: Moderately Integrated (10/23/2021)   Social Connection and Isolation Panel [NHANES]    Frequency of Communication with Friends and Family: More than three times a week    Frequency of Social Gatherings with Friends and Family: More than three times a week    Attends Religious Services: More than 4 times per year    Active Member of  Clubs or Organizations: No    Attends Arts development officer: More than 4 times per year    Marital Status: Widowed  Recent Concern: Social Connections - Moderately Isolated (09/25/2021)   Social Connection and Isolation Panel [NHANES]    Frequency of Communication with Friends and Family: More than three times a week    Frequency of Social Gatherings with Friends and Family: Once a week    Attends Religious Services: More than 4 times per year    Active Member of [employer] or Organizations: No    Attends Archivist Meetings: Never    Marital Status: Widowed  Intimate Partner Violence: Not At Risk (10/23/2021)   Humiliation, Afraid, Rape, and Kick questionnaire    Fear of Current or Ex-Partner: No    Emotionally Abused: No    Physically Abused: No    Sexually Abused: No    PHYSICAL EXAM  There were no vitals filed for this visit. There is no height or weight on file to calculate BMI.  Generalized: Well developed, in no acute distress  Neurological examination  Mentation: Alert oriented to time, place, history taking. Follows all commands speech and language fluent Cranial nerve II-XII: Pupils were equal round reactive to light. Extraocular movements were full, visual field were full on confrontational test. Facial sensation and strength were normal. Uvula tongue midline. Head turning and shoulder shrug  were normal and symmetric. Motor: The motor testing reveals 5 over 5 strength of all 4 extremities. Good symmetric motor tone is noted throughout.  Sensory: Sensory testing is intact to soft touch on all 4 extremities. No evidence of extinction is noted.  Coordination: Cerebellar testing reveals good finger-nose-finger and heel-to-shin bilaterally.  Gait and station: Gait is normal. Tandem gait is normal. Romberg is negative. No drift is seen.  Reflexes: Deep tendon reflexes are symmetric and normal bilaterally.   DIAGNOSTIC DATA (LABS, IMAGING, TESTING) - I reviewed patient records, labs, notes, testing and imaging  myself where available.  Lab Results  Component Value Date   WBC 13.5 (H) 05/27/2022   HGB 10.7 (L) 05/27/2022   HCT 34.4 (L) 05/27/2022   MCV 70.5 (L) 05/27/2022   PLT 236 05/27/2022      Component Value Date/Time   NA 134 (L) 05/27/2022 0519   K 5.2 (H) 05/27/2022 0519   CL 105 05/27/2022 0519   CO2 21 (L) 05/27/2022 0519   GLUCOSE 212 (H) 05/27/2022 0519   BUN 40 (H) 05/27/2022 0519   CREATININE 0.66 05/27/2022 0519   CREATININE 0.87 04/08/2016 1350   CALCIUM 8.4 (L) 05/27/2022 0519   PROT 5.7 (L) 05/16/2022 0405   ALBUMIN 2.9 (L) 05/16/2022 0405   AST 13 (L) 05/16/2022 0405   ALT 16 05/16/2022 0405   ALKPHOS 71 05/16/2022 0405   BILITOT 0.5 05/16/2022 0405   GFRNONAA >60 05/27/2022 0519   GFRAA >60 02/05/2015 0310   Lab Results  Component Value Date   CHOL 131 05/16/2022   HDL 36 (L) 05/16/2022   LDLCALC 25 05/16/2022   TRIG 79 05/17/2022   CHOLHDL 3.6 05/16/2022   Lab Results  Component Value Date   HGBA1C 7.0 (H) 05/16/2022   No results found for: "VITAMINB12" Lab Results  Component Value Date   TSH 1.04 01/05/2022    Butler Denmark, AGNP-C, DNP 07/01/2022, 8:47 AM [facility] [address] [place], [place] [address] [phone]

## 2022-07-02 ENCOUNTER — Inpatient Hospital Stay: Payer: Medicare HMO | Admitting: Neurology

## 2022-07-02 DIAGNOSIS — I10 Essential (primary) hypertension: Secondary | ICD-10-CM | POA: Diagnosis not present

## 2022-07-02 DIAGNOSIS — I951 Orthostatic hypotension: Secondary | ICD-10-CM | POA: Diagnosis not present

## 2022-07-02 DIAGNOSIS — M25531 Pain in right wrist: Secondary | ICD-10-CM | POA: Diagnosis not present

## 2022-07-04 NOTE — Progress Notes (Deleted)
[facility] [address], [place], [place] [address] [phone]  Date:  07/08/2022   Name:  Bailey [name]   DOB:  04/28/1956   MRN:  [id number]  PCP:  Darreld Mclean, MD    Chief Complaint: No chief complaint on file.   History of Present Illness:  Bailey [name] is a 66 y.o. very pleasant female patient who presents with the following:  Patient seen today for periodic follow-up-since her last visit she suffered a stroke Most recent visit with myself was in July- History of hyperlipidemia, diabetes with retinopathy, hypertension, GERD, beta thalassemia At her last visit we increased lisinopril from 20-40 for elevated blood pressure Lab Results  Component Value Date   HGBA1C 7.0 (H) 05/16/2022   Since her last visit, she was hospitalized with a stroke.  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   Acute cerebrovascular accident (CVA) due to occlusion of left carotid artery ([place]) 05/15/2022   Osteoporosis 10/16/2021   Type 2 diabetes mellitus with diabetic neuropathy, unspecified ([place]) 07/02/2021   Delta beta thalassemia ([place]) 11/22/2019   Diabetic retinopathy ([place]) 02/20/2017   Fatty liver 11/24/2016   GERD (gastroesophageal reflux disease) 07/26/2015   Hyperlipidemia 07/26/2015   Dyspepsia 07/26/2015   Essential hypertension 07/26/2015   Fibrocystic breast disease 03/22/2015   Type 2 diabetes mellitus treated without insulin ([place]) 02/16/2013    Past Medical History:  Diagnosis Date    Arthritis    Cataract    COPD (chronic obstructive pulmonary disease) ([place])    Diabetes mellitus without complication ([place])    Fibrocystic breast disease July 2016   GERD (gastroesophageal reflux disease)    Hyperlipidemia    Hypertension     Past Surgical History:  Procedure Laterality Date   BREAST BIOPSY Right 04/19/2015   benign   BREAST BIOPSY Right 11/01/2020   fibroadenoma   CATARACT EXTRACTION     CHOLECYSTECTOMY     COLONOSCOPY     GALLBLADDER SURGERY     IR ANGIO INTRA EXTRACRAN SEL COM CAROTID INNOMINATE UNI R MOD SED  05/16/2022   IR ANGIO VERTEBRAL SEL VERTEBRAL UNI L MOD SED  05/16/2022   IR CT HEAD LTD  05/16/2022   IR CT HEAD LTD  05/16/2022   IR CT HEAD LTD  05/16/2022   IR INTRAVSC STENT CERV CAROTID W/O EMB-PROT MOD SED INC ANGIO  05/16/2022   IR PERCUTANEOUS ART THROMBECTOMY/INFUSION INTRACRANIAL INC DIAG ANGIO  05/16/2022   RADIOLOGY WITH ANESTHESIA N/A 05/15/2022   Procedure: IR WITH ANESTHESIA;  Surgeon: Radiologist, Medication, MD;  Location: [facility];  Service: Radiology;  Laterality: N/A;   UTERINE FIBROID SURGERY      Social History   Tobacco Use   Smoking status: Every Day    Packs/day: 1.00    Years: 34.00    Total pack years: 34.00    Types: Cigarettes    Passive exposure: Current   Smokeless tobacco: Never   Tobacco comments:    Tobacco info given 10/11/15  Vaping Use   Vaping Use: Never used  Substance Use Topics   Alcohol use: Yes    Comment: socially   Drug use: No    Family History  Problem Relation Age of Onset   Diabetes Mother    Stroke Mother    Dementia Mother    Heart disease Father    Diabetes Father    Diabetes Brother    Dementia Brother    Renal Disease Brother    Diabetes Sister    Narcolepsy Sister    Arthritis Sister    Diabetes Sister    Obesity Sister    Diabetes Brother    Heart disease Brother    Cancer Brother        pancreatic   Diabetes Brother    Diabetes Brother    Stomach cancer Sister    Colon  cancer Neg Hx    Esophageal cancer Neg Hx    Rectal cancer Neg Hx     No Known Allergies  Medication list has been reviewed and updated.  Current Outpatient Medications on File Prior to Visit  Medication Sig Dispense Refill   acetaminophen (TYLENOL) 500 MG tablet Take 1,000 mg by mouth every 6 (six) hours as needed (pain). Reported on 04/08/2016     albuterol (VENTOLIN HFA) 108 (90 Base) MCG/ACT inhaler Inhale 2 puffs into the lungs every 6 (six) hours as needed for wheezing or shortness of breath. 1 each 3   alendronate (FOSAMAX) 70 MG tablet Take 1 tablet (70 mg total) by mouth every 7 (seven) days. Take with a full glass of water on an empty stomach. 12 tablet 3   aspirin 81 MG chewable tablet Chew 1 tablet (81 mg total) by mouth daily. 90 tablet 1   atorvastatin (LIPITOR) 40 MG tablet TAKE 1 TABLET EVERY DAY 90 tablet 1   Boric Acid Vaginal (AZO BORIC ACID) 600 MG SUPP Place 1 suppository vaginally at bedtime. 14 suppository 0   butalbital-acetaminophen-caffeine (FIORICET) 50-325-40 MG tablet Take 1-2 tablets by mouth every 6 (six) hours as needed for headache. Max 6 per day 30 tablet 0   cholestyramine (QUESTRAN) 4 g packet DISSOLVE & TAKE 1 POWDER PACKET BY MOUTH TWICE DAILY 180 packet 3   cyclobenzaprine (FLEXERIL) 5 MG tablet Take 1 tablet (5 mg total) by mouth at bedtime. Use as needed for neck pain 30 tablet 1   insulin aspart (NOVOLOG) 100 UNIT/ML injection Inject 4 Units into the skin 3 (three) times daily with meals. 10 mL 11   insulin glargine-yfgn (SEMGLEE) 100 UNIT/ML injection Inject 0.24 mLs (24 Units total) into the skin daily. 10 mL 11   lisinopril (ZESTRIL) 40 MG tablet Take 1 tablet (40 mg total) by mouth daily. 90 tablet 3   Multiple Vitamin (MULTIVITAMIN WITH MINERALS) TABS tablet Take 1 tablet by mouth daily. 30 tablet 0   pantoprazole (PROTONIX) 40 MG tablet Take 1 tablet (40 mg total) by mouth 2 (two) times daily. 180 tablet 1   ticagrelor (BRILINTA) 90 MG TABS  tablet Take 1 tablet (90 mg total) by mouth 2 (two) times daily. 60 tablet 1   triamcinolone cream (KENALOG) 0.1 % Apply 1 application topically 2 (two) times daily. Use as needed on dry or scaly areas 60 g 1   No current facility-administered medications on file prior to visit.    Review of Systems:  As per HPI- otherwise negative.   Physical Examination: There were no vitals filed for this visit. There were no vitals filed for this visit. There is no height  or weight on file to calculate BMI. Ideal Body Weight:    GEN: no acute distress. HEENT: Atraumatic, Normocephalic.  Ears and Nose: No external deformity. CV: RRR, No M/G/R. No JVD. No thrill. No extra heart sounds. PULM: CTA B, no wheezes, crackles, rhonchi. No retractions. No resp. distress. No accessory muscle use. ABD: S, NT, ND, +BS. No rebound. No HSM. EXTR: No c/c/e PSYCH: Normally interactive. Conversant.  Awake well developed regarding  Assessment and Plan: ***  Signed Lamar Blinks, MD

## 2022-07-06 NOTE — Progress Notes (Deleted)
Patient: Bailey [name] Date of Birth: 01-22-1956  Reason for Visit: Follow up History from: Patient Primary Neurologist:    ASSESSMENT AND PLAN 66 y.o. year old female   71.  Stroke: Left MCA, ACA, MCA/PCA infarcts due to left ICA, M2, P2 occlusion; status post IR of occluded left ICA, M2, P2 with TICI2c.  Rescue left proximal ICA stent which was occluded, etiology likely due to large vessel disease. -Continue aspirin 81 mg daily, Brilinta 90 mg twice a day  2.  Carotid stenosis -Stent assisted angioplasty of the left ICA, MRA showed reocclusion of stent -On aspirin 81 mg daily, Brilinta 90 mg twice a day -Needs to follow up with IR   3.  Hypertension -Long-term BP goal 130-150 given left ICA occlusion  4.  Hyperlipidemia -LDL 25, goal less than 70 -On Lipitor 40  5.  Type 2 diabetes -A1c 7.2, goal less than 7.0  6.  Tobacco abuse  Hospitalization complicated by: UTI, dysphagia  HISTORY OF PRESENT ILLNESS: Today 07/06/22 Bailey [name] is here for stroke clinic follow-up.  Presented 8/25 to the ER for right-sided weakness.  Taken to IR emergently for mechanical thrombectomy and stent assisted angioplasty of the left ICA.  MRI and MRA showed reocclusion of stent.  -CT code stroke head no acute abnormality.  There was small vessel disease, atrophy. -CTA head and neck posterior left M2 segment is occluded with poor collaterals.  High-grade stenosis or occlusion of the left internal carotid artery at the bifurcation with reconstitution in the left cavernous segment likely from the ophthalmic artery.  High-grade stenosis or occlusion of the left P2 segment -CT perfusion core 62 ml, perfusion 123 ml, mismatch 61 -MRI of the brain acute infarct involving the posterior left MCA territory and posterior left insula. Additional punctate foci of acute nonhemorrhagic infarct are present along the watershed distribution. 40m acute nonhemorrhagic infarct in the medial left  thalamus -MRA head and neck no flow in the left ICA.  Moderate stenosis of the left P2 -2D echo EF 65 to 70% -LDL 25 -A1c 7.2 -UDS negative -No antithrombotic prior to admission, now aspirin 81 mg daily and Brilinta 90 mg twice daily   HISTORY  Copied 05/15/22 Dr. BCurly ShoresHPI: Bailey Mortellarois a 66y.o. female with past medical history significant for hypertension, hyperlipidemia, diabetes, tobacco abuse.   She last talked to family several days before admission.  Symptom discovery was at 2 PM on 8/26, and she was triaged in the ED at 4:41 PM.  Case was discussed with neurology by ED provider and code stroke was not activated due to unknown last known well.  Initial head CT was read with no acute intracranial process, initial labs were all highly abnormal   Her initial examination for ED provider was notable for fluctuating right-sided weakness.  She was making repetitive statements ("I can't talk") and not following commands   On my review of the case, initial head CT was concerning for hyperdense left MCA vessel sign with aspects of 10 and therefore I assessed the patient for potential intra-arterial thrombectomy emergently   LKW: 2 days prior to admission tPA given?: No, out of the window IA performed?:  Yes, delayed due to being out of the window, and initial labs demonstrating multiple metabolic abnormalities, initial waxing and waning symptoms leading to delayed neurology evaluation with advanced imaging demonstrating favorable profile for intervention Premorbid modified rankin scale: 0-1     0 - No symptoms.  1 - No significant disability. Able to carry out all usual activities, despite some symptoms.  REVIEW OF SYSTEMS: Out of a complete 14 system review of symptoms, the patient complains only of the following symptoms, and all other reviewed systems are negative.  See HPI  ALLERGIES: No Known Allergies  HOME MEDICATIONS: Outpatient Medications Prior to Visit   Medication Sig Dispense Refill   acetaminophen (TYLENOL) 500 MG tablet Take 1,000 mg by mouth every 6 (six) hours as needed (pain). Reported on 04/08/2016     albuterol (VENTOLIN HFA) 108 (90 Base) MCG/ACT inhaler Inhale 2 puffs into the lungs every 6 (six) hours as needed for wheezing or shortness of breath. 1 each 3   alendronate (FOSAMAX) 70 MG tablet Take 1 tablet (70 mg total) by mouth every 7 (seven) days. Take with a full glass of water on an empty stomach. 12 tablet 3   aspirin 81 MG chewable tablet Chew 1 tablet (81 mg total) by mouth daily. 90 tablet 1   atorvastatin (LIPITOR) 40 MG tablet TAKE 1 TABLET EVERY DAY 90 tablet 1   Boric Acid Vaginal (AZO BORIC ACID) 600 MG SUPP Place 1 suppository vaginally at bedtime. 14 suppository 0   butalbital-acetaminophen-caffeine (FIORICET) 50-325-40 MG tablet Take 1-2 tablets by mouth every 6 (six) hours as needed for headache. Max 6 per day 30 tablet 0   cholestyramine (QUESTRAN) 4 g packet DISSOLVE & TAKE 1 POWDER PACKET BY MOUTH TWICE DAILY 180 packet 3   cyclobenzaprine (FLEXERIL) 5 MG tablet Take 1 tablet (5 mg total) by mouth at bedtime. Use as needed for neck pain 30 tablet 1   insulin aspart (NOVOLOG) 100 UNIT/ML injection Inject 4 Units into the skin 3 (three) times daily with meals. 10 mL 11   insulin glargine-yfgn (SEMGLEE) 100 UNIT/ML injection Inject 0.24 mLs (24 Units total) into the skin daily. 10 mL 11   lisinopril (ZESTRIL) 40 MG tablet Take 1 tablet (40 mg total) by mouth daily. 90 tablet 3   Multiple Vitamin (MULTIVITAMIN WITH MINERALS) TABS tablet Take 1 tablet by mouth daily. 30 tablet 0   pantoprazole (PROTONIX) 40 MG tablet Take 1 tablet (40 mg total) by mouth 2 (two) times daily. 180 tablet 1   ticagrelor (BRILINTA) 90 MG TABS tablet Take 1 tablet (90 mg total) by mouth 2 (two) times daily. 60 tablet 1   triamcinolone cream (KENALOG) 0.1 % Apply 1 application topically 2 (two) times daily. Use as needed on dry or scaly areas 60  g 1   No facility-administered medications prior to visit.    PAST MEDICAL HISTORY: Past Medical History:  Diagnosis Date   Arthritis    Cataract    COPD (chronic obstructive pulmonary disease) ([place])    Diabetes mellitus without complication ([place])    Fibrocystic breast disease July 2016   GERD (gastroesophageal reflux disease)    Hyperlipidemia    Hypertension     PAST SURGICAL HISTORY: Past Surgical History:  Procedure Laterality Date   BREAST BIOPSY Right 04/19/2015   benign   BREAST BIOPSY Right 11/01/2020   fibroadenoma   CATARACT EXTRACTION     CHOLECYSTECTOMY     COLONOSCOPY     GALLBLADDER SURGERY     IR ANGIO INTRA EXTRACRAN SEL COM CAROTID INNOMINATE UNI R MOD SED  05/16/2022   IR ANGIO VERTEBRAL SEL VERTEBRAL UNI L MOD SED  05/16/2022   IR CT HEAD LTD  05/16/2022   IR CT HEAD LTD  05/16/2022  IR CT HEAD LTD  05/16/2022   IR INTRAVSC STENT CERV CAROTID W/O EMB-PROT MOD SED INC ANGIO  05/16/2022   IR PERCUTANEOUS ART THROMBECTOMY/INFUSION INTRACRANIAL INC DIAG ANGIO  05/16/2022   RADIOLOGY WITH ANESTHESIA N/A 05/15/2022   Procedure: IR WITH ANESTHESIA;  Surgeon: Radiologist, Medication, MD;  Location: [facility];  Service: Radiology;  Laterality: N/A;   UTERINE FIBROID SURGERY      FAMILY HISTORY: Family History  Problem Relation Age of Onset   Diabetes Mother    Stroke Mother    Dementia Mother    Heart disease Father    Diabetes Father    Diabetes Brother    Dementia Brother    Renal Disease Brother    Diabetes Sister    Narcolepsy Sister    Arthritis Sister    Diabetes Sister    Obesity Sister    Diabetes Brother    Heart disease Brother    Cancer Brother        pancreatic   Diabetes Brother    Diabetes Brother    Stomach cancer Sister    Colon cancer Neg Hx    Esophageal cancer Neg Hx    Rectal cancer Neg Hx     SOCIAL HISTORY: Social History   Socioeconomic History   Marital status: Widowed    Spouse name: Not on file   Number of children: 2    Years of education: 14   Highest education level: Associate degree: occupational, Hotel manager, or vocational program  Occupational History   Occupation: Habilitation tech  Tobacco Use   Smoking status: Every Day    Packs/day: 1.00    Years: 34.00    Total pack years: 34.00    Types: Cigarettes    Passive exposure: Current   Smokeless tobacco: Never   Tobacco comments:    Tobacco info given 10/11/15  Vaping Use   Vaping Use: Never used  Substance and Sexual Activity   Alcohol use: Yes    Comment: socially   Drug use: No   Sexual activity: Not Currently    Birth control/protection: None  Other Topics Concern   Not on file  Social History Narrative   Not on file   Social Determinants of Health   Financial Resource Strain: Low Risk  (10/23/2021)   Overall Financial Resource Strain (CARDIA)    Difficulty of Paying Living Expenses: Not hard at all  Food Insecurity: No Food Insecurity (10/23/2021)   Hunger Vital Sign    Worried About Running Out of Food in the Last Year: Never true    [place] in the Last Year: Never true  Transportation Needs: No Transportation Needs (10/23/2021)   PRAPARE - Hydrologist (Medical): No    Lack of Transportation (Non-Medical): No  Physical Activity: Inactive (10/23/2021)   Exercise Vital Sign    Days of Exercise per Week: 0 days    Minutes of Exercise per Session: 0 min  Stress: Stress Concern Present (10/23/2021)   [place]    Feeling of Stress : Rather much  Social Connections: Moderately Integrated (10/23/2021)   Social Connection and Isolation Panel [NHANES]    Frequency of Communication with Friends and Family: More than three times a week    Frequency of Social Gatherings with Friends and Family: More than three times a week    Attends Religious Services: More than 4 times per year    Active Member of  Clubs or Organizations: No    Attends Arts development officer: More than 4 times per year    Marital Status: Widowed  Recent Concern: Social Connections - Moderately Isolated (09/25/2021)   Social Connection and Isolation Panel [NHANES]    Frequency of Communication with Friends and Family: More than three times a week    Frequency of Social Gatherings with Friends and Family: Once a week    Attends Religious Services: More than 4 times per year    Active Member of [employer] or Organizations: No    Attends Archivist Meetings: Never    Marital Status: Widowed  Intimate Partner Violence: Not At Risk (10/23/2021)   Humiliation, Afraid, Rape, and Kick questionnaire    Fear of Current or Ex-Partner: No    Emotionally Abused: No    Physically Abused: No    Sexually Abused: No    PHYSICAL EXAM  There were no vitals filed for this visit. There is no height or weight on file to calculate BMI.  Generalized: Well developed, in no acute distress  Neurological examination  Mentation: Alert oriented to time, place, history taking. Follows all commands speech and language fluent Cranial nerve II-XII: Pupils were equal round reactive to light. Extraocular movements were full, visual field were full on confrontational test. Facial sensation and strength were normal. Uvula tongue midline. Head turning and shoulder shrug  were normal and symmetric. Motor: The motor testing reveals 5 over 5 strength of all 4 extremities. Good symmetric motor tone is noted throughout.  Sensory: Sensory testing is intact to soft touch on all 4 extremities. No evidence of extinction is noted.  Coordination: Cerebellar testing reveals good finger-nose-finger and heel-to-shin bilaterally.  Gait and station: Gait is normal. Tandem gait is normal. Romberg is negative. No drift is seen.  Reflexes: Deep tendon reflexes are symmetric and normal bilaterally.   DIAGNOSTIC DATA (LABS, IMAGING, TESTING) - I reviewed patient records, labs, notes, testing and imaging  myself where available.  Lab Results  Component Value Date   WBC 13.5 (H) 05/27/2022   HGB 10.7 (L) 05/27/2022   HCT 34.4 (L) 05/27/2022   MCV 70.5 (L) 05/27/2022   PLT 236 05/27/2022      Component Value Date/Time   NA 134 (L) 05/27/2022 0519   K 5.2 (H) 05/27/2022 0519   CL 105 05/27/2022 0519   CO2 21 (L) 05/27/2022 0519   GLUCOSE 212 (H) 05/27/2022 0519   BUN 40 (H) 05/27/2022 0519   CREATININE 0.66 05/27/2022 0519   CREATININE 0.87 04/08/2016 1350   CALCIUM 8.4 (L) 05/27/2022 0519   PROT 5.7 (L) 05/16/2022 0405   ALBUMIN 2.9 (L) 05/16/2022 0405   AST 13 (L) 05/16/2022 0405   ALT 16 05/16/2022 0405   ALKPHOS 71 05/16/2022 0405   BILITOT 0.5 05/16/2022 0405   GFRNONAA >60 05/27/2022 0519   GFRAA >60 02/05/2015 0310   Lab Results  Component Value Date   CHOL 131 05/16/2022   HDL 36 (L) 05/16/2022   LDLCALC 25 05/16/2022   TRIG 79 05/17/2022   CHOLHDL 3.6 05/16/2022   Lab Results  Component Value Date   HGBA1C 7.0 (H) 05/16/2022   No results found for: "VITAMINB12" Lab Results  Component Value Date   TSH 1.04 01/05/2022    Butler Denmark, AGNP-C, DNP 07/06/2022, 8:50 AM [facility] [address], [place], [place] [address] [phone]

## 2022-07-07 ENCOUNTER — Inpatient Hospital Stay: Payer: Medicare HMO | Admitting: Neurology

## 2022-07-07 DIAGNOSIS — M24541 Contracture, right hand: Secondary | ICD-10-CM | POA: Diagnosis not present

## 2022-07-07 DIAGNOSIS — M25561 Pain in right knee: Secondary | ICD-10-CM | POA: Diagnosis not present

## 2022-07-07 DIAGNOSIS — I69951 Hemiplegia and hemiparesis following unspecified cerebrovascular disease affecting right dominant side: Secondary | ICD-10-CM | POA: Diagnosis not present

## 2022-07-08 ENCOUNTER — Ambulatory Visit: Payer: Medicare HMO | Admitting: Family Medicine

## 2022-07-10 DIAGNOSIS — I69398 Other sequelae of cerebral infarction: Secondary | ICD-10-CM | POA: Diagnosis not present

## 2022-07-10 DIAGNOSIS — R2689 Other abnormalities of gait and mobility: Secondary | ICD-10-CM | POA: Diagnosis not present

## 2022-07-10 DIAGNOSIS — R4189 Other symptoms and signs involving cognitive functions and awareness: Secondary | ICD-10-CM | POA: Diagnosis not present

## 2022-07-10 DIAGNOSIS — G8321 Monoplegia of upper limb affecting right dominant side: Secondary | ICD-10-CM | POA: Diagnosis not present

## 2022-07-13 ENCOUNTER — Encounter: Payer: Self-pay | Admitting: *Deleted

## 2022-07-15 DIAGNOSIS — R4189 Other symptoms and signs involving cognitive functions and awareness: Secondary | ICD-10-CM | POA: Diagnosis not present

## 2022-07-15 DIAGNOSIS — E860 Dehydration: Secondary | ICD-10-CM | POA: Diagnosis not present

## 2022-07-15 DIAGNOSIS — G8321 Monoplegia of upper limb affecting right dominant side: Secondary | ICD-10-CM | POA: Diagnosis not present

## 2022-07-15 DIAGNOSIS — I69398 Other sequelae of cerebral infarction: Secondary | ICD-10-CM | POA: Diagnosis not present

## 2022-07-15 DIAGNOSIS — R2689 Other abnormalities of gait and mobility: Secondary | ICD-10-CM | POA: Diagnosis not present

## 2022-07-15 DIAGNOSIS — R55 Syncope and collapse: Secondary | ICD-10-CM | POA: Diagnosis not present

## 2022-07-15 DIAGNOSIS — I69951 Hemiplegia and hemiparesis following unspecified cerebrovascular disease affecting right dominant side: Secondary | ICD-10-CM | POA: Diagnosis not present

## 2022-07-16 DIAGNOSIS — I10 Essential (primary) hypertension: Secondary | ICD-10-CM | POA: Diagnosis not present

## 2022-07-16 DIAGNOSIS — E119 Type 2 diabetes mellitus without complications: Secondary | ICD-10-CM | POA: Diagnosis not present

## 2022-07-17 DIAGNOSIS — I69951 Hemiplegia and hemiparesis following unspecified cerebrovascular disease affecting right dominant side: Secondary | ICD-10-CM | POA: Diagnosis not present

## 2022-07-17 DIAGNOSIS — I951 Orthostatic hypotension: Secondary | ICD-10-CM | POA: Diagnosis not present

## 2022-07-21 DIAGNOSIS — M62838 Other muscle spasm: Secondary | ICD-10-CM | POA: Diagnosis not present

## 2022-07-21 DIAGNOSIS — J449 Chronic obstructive pulmonary disease, unspecified: Secondary | ICD-10-CM | POA: Diagnosis not present

## 2022-07-21 DIAGNOSIS — Z79899 Other long term (current) drug therapy: Secondary | ICD-10-CM | POA: Diagnosis not present

## 2022-07-22 DIAGNOSIS — F411 Generalized anxiety disorder: Secondary | ICD-10-CM | POA: Diagnosis not present

## 2022-07-22 DIAGNOSIS — G8321 Monoplegia of upper limb affecting right dominant side: Secondary | ICD-10-CM | POA: Diagnosis not present

## 2022-07-22 DIAGNOSIS — I69320 Aphasia following cerebral infarction: Secondary | ICD-10-CM | POA: Diagnosis not present

## 2022-07-22 DIAGNOSIS — I69398 Other sequelae of cerebral infarction: Secondary | ICD-10-CM | POA: Diagnosis not present

## 2022-07-22 DIAGNOSIS — R2689 Other abnormalities of gait and mobility: Secondary | ICD-10-CM | POA: Diagnosis not present

## 2022-07-22 DIAGNOSIS — R451 Restlessness and agitation: Secondary | ICD-10-CM | POA: Diagnosis not present

## 2022-07-22 DIAGNOSIS — I6930 Unspecified sequelae of cerebral infarction: Secondary | ICD-10-CM | POA: Diagnosis not present

## 2022-07-22 DIAGNOSIS — R4189 Other symptoms and signs involving cognitive functions and awareness: Secondary | ICD-10-CM | POA: Diagnosis not present

## 2022-07-23 DIAGNOSIS — E1140 Type 2 diabetes mellitus with diabetic neuropathy, unspecified: Secondary | ICD-10-CM | POA: Diagnosis not present

## 2022-07-28 DIAGNOSIS — I69320 Aphasia following cerebral infarction: Secondary | ICD-10-CM | POA: Diagnosis not present

## 2022-07-28 DIAGNOSIS — I6930 Unspecified sequelae of cerebral infarction: Secondary | ICD-10-CM | POA: Diagnosis not present

## 2022-07-28 DIAGNOSIS — E1140 Type 2 diabetes mellitus with diabetic neuropathy, unspecified: Secondary | ICD-10-CM | POA: Diagnosis not present

## 2022-07-29 DIAGNOSIS — G8321 Monoplegia of upper limb affecting right dominant side: Secondary | ICD-10-CM | POA: Diagnosis not present

## 2022-07-29 DIAGNOSIS — I69398 Other sequelae of cerebral infarction: Secondary | ICD-10-CM | POA: Diagnosis not present

## 2022-07-29 DIAGNOSIS — R4189 Other symptoms and signs involving cognitive functions and awareness: Secondary | ICD-10-CM | POA: Diagnosis not present

## 2022-07-29 DIAGNOSIS — R2689 Other abnormalities of gait and mobility: Secondary | ICD-10-CM | POA: Diagnosis not present

## 2022-08-03 DIAGNOSIS — I63232 Cerebral infarction due to unspecified occlusion or stenosis of left carotid arteries: Secondary | ICD-10-CM | POA: Diagnosis not present

## 2022-08-03 DIAGNOSIS — M6281 Muscle weakness (generalized): Secondary | ICD-10-CM | POA: Diagnosis not present

## 2022-08-04 DIAGNOSIS — I63232 Cerebral infarction due to unspecified occlusion or stenosis of left carotid arteries: Secondary | ICD-10-CM | POA: Diagnosis not present

## 2022-08-04 DIAGNOSIS — M6281 Muscle weakness (generalized): Secondary | ICD-10-CM | POA: Diagnosis not present

## 2022-08-05 DIAGNOSIS — I63232 Cerebral infarction due to unspecified occlusion or stenosis of left carotid arteries: Secondary | ICD-10-CM | POA: Diagnosis not present

## 2022-08-05 DIAGNOSIS — M6281 Muscle weakness (generalized): Secondary | ICD-10-CM | POA: Diagnosis not present

## 2022-08-05 NOTE — Progress Notes (Unsigned)
Patient: Bailey [name] Date of Birth: 03/24/1956  Reason for Visit: Follow up History from: Patient Primary Neurologist:    ASSESSMENT AND PLAN 66 y.o. year old female   1.  Stroke: Left MCA, ACA, MCA/PCA infarcts due to left ICA, M2, P2 occlusion; status post IR of occluded left ICA, M2, P2 with TICI2c.  Rescue left proximal ICA stent which was occluded, etiology likely due to large vessel disease. -Continue aspirin 81 mg daily, Brilinta 90 mg twice a day  2.  Carotid stenosis -Stent assisted angioplasty of the left ICA, MRA showed reocclusion of stent -On aspirin 81 mg daily, Brilinta 90 mg twice a day -Needs to follow up with IR   3.  Hypertension -Long-term BP goal 130-150 given left ICA occlusion  4.  Hyperlipidemia -LDL 25, goal less than 70 -On Lipitor 40  5.  Type 2 diabetes -A1c 7.2, goal less than 7.0  6.  Tobacco abuse  Hospitalization complicated by: UTI, dysphagia  HISTORY OF PRESENT ILLNESS: Today 08/05/22 Bailey [name] is here for stroke clinic follow-up.  Presented 8/25 to the ER for right-sided weakness.  Taken to IR emergently for mechanical thrombectomy and stent assisted angioplasty of the left ICA.  MRI and MRA showed reocclusion of stent.  -CT code stroke head no acute abnormality.  There was small vessel disease, atrophy. -CTA head and neck posterior left M2 segment is occluded with poor collaterals.  High-grade stenosis or occlusion of the left internal carotid artery at the bifurcation with reconstitution in the left cavernous segment likely from the ophthalmic artery.  High-grade stenosis or occlusion of the left P2 segment -CT perfusion core 62 ml, perfusion 123 ml, mismatch 61 -MRI of the brain acute infarct involving the posterior left MCA territory and posterior left insula. Additional punctate foci of acute nonhemorrhagic infarct are present along the watershed distribution. 5mm acute nonhemorrhagic infarct in the medial left  thalamus -MRA head and neck no flow in the left ICA.  Moderate stenosis of the left P2 -2D echo EF 65 to 70% -LDL 25 -A1c 7.2 -UDS negative -No antithrombotic prior to admission, now aspirin 81 mg daily and Brilinta 90 mg twice daily   HISTORY  Copied 05/15/22 Dr. Iver Nestle HPI: Bailey [name] is a 66 y.o. female with past medical history significant for hypertension, hyperlipidemia, diabetes, tobacco abuse.   She last talked to family several days before admission.  Symptom discovery was at 2 PM on 8/26, and she was triaged in the ED at 4:41 PM.  Case was discussed with neurology by ED provider and code stroke was not activated due to unknown last known well.  Initial head CT was read with no acute intracranial process, initial labs were all highly abnormal   Her initial examination for ED provider was notable for fluctuating right-sided weakness.  She was making repetitive statements ("I can't talk") and not following commands   On my review of the case, initial head CT was concerning for hyperdense left MCA vessel sign with aspects of 10 and therefore I assessed the patient for potential intra-arterial thrombectomy emergently   LKW: 2 days prior to admission tPA given?: No, out of the window IA performed?:  Yes, delayed due to being out of the window, and initial labs demonstrating multiple metabolic abnormalities, initial waxing and waning symptoms leading to delayed neurology evaluation with advanced imaging demonstrating favorable profile for intervention Premorbid modified rankin scale: 0-1     0 - No symptoms.  1 - No significant disability. Able to carry out all usual activities, despite some symptoms.  REVIEW OF SYSTEMS: Out of a complete 14 system review of symptoms, the patient complains only of the following symptoms, and all other reviewed systems are negative.  See HPI  ALLERGIES: No Known Allergies  HOME MEDICATIONS: Outpatient Medications Prior to Visit   Medication Sig Dispense Refill   acetaminophen (TYLENOL) 500 MG tablet Take 1,000 mg by mouth every 6 (six) hours as needed (pain). Reported on 04/08/2016     albuterol (VENTOLIN HFA) 108 (90 Base) MCG/ACT inhaler Inhale 2 puffs into the lungs every 6 (six) hours as needed for wheezing or shortness of breath. 1 each 3   alendronate (FOSAMAX) 70 MG tablet Take 1 tablet (70 mg total) by mouth every 7 (seven) days. Take with a full glass of water on an empty stomach. 12 tablet 3   aspirin 81 MG chewable tablet Chew 1 tablet (81 mg total) by mouth daily. 90 tablet 1   atorvastatin (LIPITOR) 40 MG tablet TAKE 1 TABLET EVERY DAY 90 tablet 1   Boric Acid Vaginal (AZO BORIC ACID) 600 MG SUPP Place 1 suppository vaginally at bedtime. 14 suppository 0   butalbital-acetaminophen-caffeine (FIORICET) 50-325-40 MG tablet Take 1-2 tablets by mouth every 6 (six) hours as needed for headache. Max 6 per day 30 tablet 0   cholestyramine (QUESTRAN) 4 g packet DISSOLVE & TAKE 1 POWDER PACKET BY MOUTH TWICE DAILY 180 packet 3   cyclobenzaprine (FLEXERIL) 5 MG tablet Take 1 tablet (5 mg total) by mouth at bedtime. Use as needed for neck pain 30 tablet 1   insulin aspart (NOVOLOG) 100 UNIT/ML injection Inject 4 Units into the skin 3 (three) times daily with meals. 10 mL 11   insulin glargine-yfgn (SEMGLEE) 100 UNIT/ML injection Inject 0.24 mLs (24 Units total) into the skin daily. 10 mL 11   lisinopril (ZESTRIL) 40 MG tablet Take 1 tablet (40 mg total) by mouth daily. 90 tablet 3   Multiple Vitamin (MULTIVITAMIN WITH MINERALS) TABS tablet Take 1 tablet by mouth daily. 30 tablet 0   pantoprazole (PROTONIX) 40 MG tablet Take 1 tablet (40 mg total) by mouth 2 (two) times daily. 180 tablet 1   ticagrelor (BRILINTA) 90 MG TABS tablet Take 1 tablet (90 mg total) by mouth 2 (two) times daily. 60 tablet 1   triamcinolone cream (KENALOG) 0.1 % Apply 1 application topically 2 (two) times daily. Use as needed on dry or scaly areas 60  g 1   No facility-administered medications prior to visit.    PAST MEDICAL HISTORY: Past Medical History:  Diagnosis Date   Arthritis    Cataract    COPD (chronic obstructive pulmonary disease) (HCC)    Diabetes mellitus without complication (HCC)    Fibrocystic breast disease July 2016   GERD (gastroesophageal reflux disease)    Hyperlipidemia    Hypertension     PAST SURGICAL HISTORY: Past Surgical History:  Procedure Laterality Date   BREAST BIOPSY Right 04/19/2015   benign   BREAST BIOPSY Right 11/01/2020   fibroadenoma   CATARACT EXTRACTION     CHOLECYSTECTOMY     COLONOSCOPY     GALLBLADDER SURGERY     IR ANGIO INTRA EXTRACRAN SEL COM CAROTID INNOMINATE UNI R MOD SED  05/16/2022   IR ANGIO VERTEBRAL SEL VERTEBRAL UNI L MOD SED  05/16/2022   IR CT HEAD LTD  05/16/2022   IR CT HEAD LTD  05/16/2022  IR CT HEAD LTD  05/16/2022   IR INTRAVSC STENT CERV CAROTID W/O EMB-PROT MOD SED INC ANGIO  05/16/2022   IR PERCUTANEOUS ART THROMBECTOMY/INFUSION INTRACRANIAL INC DIAG ANGIO  05/16/2022   RADIOLOGY WITH ANESTHESIA N/A 05/15/2022   Procedure: IR WITH ANESTHESIA;  Surgeon: Radiologist, Medication, MD;  Location: [facility] OR;  Service: Radiology;  Laterality: N/A;   UTERINE FIBROID SURGERY      FAMILY HISTORY: Family History  Problem Relation Age of Onset   Diabetes Mother    Stroke Mother    Dementia Mother    Heart disease Father    Diabetes Father    Diabetes Brother    Dementia Brother    Renal Disease Brother    Diabetes Sister    Narcolepsy Sister    Arthritis Sister    Diabetes Sister    Obesity Sister    Diabetes Brother    Heart disease Brother    Cancer Brother        pancreatic   Diabetes Brother    Diabetes Brother    Stomach cancer Sister    Colon cancer Neg Hx    Esophageal cancer Neg Hx    Rectal cancer Neg Hx     SOCIAL HISTORY: Social History   Socioeconomic History   Marital status: Widowed    Spouse name: Not on file   Number of children: 2    Years of education: 14   Highest education level: Associate degree: occupational, Scientist, product/process development, or vocational program  Occupational History   Occupation: Habilitation tech  Tobacco Use   Smoking status: Every Day    Packs/day: 1.00    Years: 34.00    Total pack years: 34.00    Types: Cigarettes    Passive exposure: Current   Smokeless tobacco: Never   Tobacco comments:    Tobacco info given 10/11/15  Vaping Use   Vaping Use: Never used  Substance and Sexual Activity   Alcohol use: Yes    Comment: socially   Drug use: No   Sexual activity: Not Currently    Birth control/protection: None  Other Topics Concern   Not on file  Social History Narrative   Not on file   Social Determinants of Health   Financial Resource Strain: Low Risk  (10/23/2021)   Overall Financial Resource Strain (CARDIA)    Difficulty of Paying Living Expenses: Not hard at all  Food Insecurity: No Food Insecurity (10/23/2021)   Hunger Vital Sign    Worried About Running Out of Food in the Last Year: Never true    Ran Out of Food in the Last Year: Never true  Transportation Needs: No Transportation Needs (10/23/2021)   PRAPARE - Administrator, Civil Service (Medical): No    Lack of Transportation (Non-Medical): No  Physical Activity: Inactive (10/23/2021)   Exercise Vital Sign    Days of Exercise per Week: 0 days    Minutes of Exercise per Session: 0 min  Stress: Stress Concern Present (10/23/2021)   [employer] of Occupational Health - Occupational Stress Questionnaire    Feeling of Stress : Rather much  Social Connections: Moderately Integrated (10/23/2021)   Social Connection and Isolation Panel [NHANES]    Frequency of Communication with Friends and Family: More than three times a week    Frequency of Social Gatherings with Friends and Family: More than three times a week    Attends Religious Services: More than 4 times per year    Active Member of  Clubs or Organizations: No    Attends Probation officer: More than 4 times per year    Marital Status: Widowed  Recent Concern: Social Connections - Moderately Isolated (09/25/2021)   Social Connection and Isolation Panel [NHANES]    Frequency of Communication with Friends and Family: More than three times a week    Frequency of Social Gatherings with Friends and Family: Once a week    Attends Religious Services: More than 4 times per year    Active Member of [employer] or Organizations: No    Attends Banker Meetings: Never    Marital Status: Widowed  Intimate Partner Violence: Not At Risk (10/23/2021)   Humiliation, Afraid, Rape, and Kick questionnaire    Fear of Current or Ex-Partner: No    Emotionally Abused: No    Physically Abused: No    Sexually Abused: No    PHYSICAL EXAM  There were no vitals filed for this visit. There is no height or weight on file to calculate BMI.  Generalized: Well developed, in no acute distress  Neurological examination  Mentation: Alert oriented to time, place, history taking. Follows all commands speech and language fluent Cranial nerve II-XII: Pupils were equal round reactive to light. Extraocular movements were full, visual field were full on confrontational test. Facial sensation and strength were normal. Uvula tongue midline. Head turning and shoulder shrug  were normal and symmetric. Motor: The motor testing reveals 5 over 5 strength of all 4 extremities. Good symmetric motor tone is noted throughout.  Sensory: Sensory testing is intact to soft touch on all 4 extremities. No evidence of extinction is noted.  Coordination: Cerebellar testing reveals good finger-nose-finger and heel-to-shin bilaterally.  Gait and station: Gait is normal. Tandem gait is normal. Romberg is negative. No drift is seen.  Reflexes: Deep tendon reflexes are symmetric and normal bilaterally.   DIAGNOSTIC DATA (LABS, IMAGING, TESTING) - I reviewed patient records, labs, notes, testing and imaging  myself where available.  Lab Results  Component Value Date   WBC 13.5 (H) 05/27/2022   HGB 10.7 (L) 05/27/2022   HCT 34.4 (L) 05/27/2022   MCV 70.5 (L) 05/27/2022   PLT 236 05/27/2022      Component Value Date/Time   NA 134 (L) 05/27/2022 0519   K 5.2 (H) 05/27/2022 0519   CL 105 05/27/2022 0519   CO2 21 (L) 05/27/2022 0519   GLUCOSE 212 (H) 05/27/2022 0519   BUN 40 (H) 05/27/2022 0519   CREATININE 0.66 05/27/2022 0519   CREATININE 0.87 04/08/2016 1350   CALCIUM 8.4 (L) 05/27/2022 0519   PROT 5.7 (L) 05/16/2022 0405   ALBUMIN 2.9 (L) 05/16/2022 0405   AST 13 (L) 05/16/2022 0405   ALT 16 05/16/2022 0405   ALKPHOS 71 05/16/2022 0405   BILITOT 0.5 05/16/2022 0405   GFRNONAA >60 05/27/2022 0519   GFRAA >60 02/05/2015 0310   Lab Results  Component Value Date   CHOL 131 05/16/2022   HDL 36 (L) 05/16/2022   LDLCALC 25 05/16/2022   TRIG 79 05/17/2022   CHOLHDL 3.6 05/16/2022   Lab Results  Component Value Date   HGBA1C 7.0 (H) 05/16/2022   No results found for: "VITAMINB12" Lab Results  Component Value Date   TSH 1.04 01/05/2022    Margie Ege, AGNP-C, DNP 08/05/2022, 1:49 PM [facility] [address], [address] [place], [place] [address] [phone]

## 2022-08-06 ENCOUNTER — Encounter: Payer: Self-pay | Admitting: Neurology

## 2022-08-06 ENCOUNTER — Ambulatory Visit (INDEPENDENT_AMBULATORY_CARE_PROVIDER_SITE_OTHER): Payer: Medicare HMO | Admitting: Neurology

## 2022-08-06 DIAGNOSIS — I69351 Hemiplegia and hemiparesis following cerebral infarction affecting right dominant side: Secondary | ICD-10-CM | POA: Diagnosis not present

## 2022-08-06 DIAGNOSIS — R4701 Aphasia: Secondary | ICD-10-CM

## 2022-08-06 DIAGNOSIS — I6522 Occlusion and stenosis of left carotid artery: Secondary | ICD-10-CM

## 2022-08-06 DIAGNOSIS — I63232 Cerebral infarction due to unspecified occlusion or stenosis of left carotid arteries: Secondary | ICD-10-CM | POA: Diagnosis not present

## 2022-08-06 DIAGNOSIS — I63412 Cerebral infarction due to embolism of left middle cerebral artery: Secondary | ICD-10-CM | POA: Diagnosis not present

## 2022-08-06 DIAGNOSIS — M6281 Muscle weakness (generalized): Secondary | ICD-10-CM | POA: Diagnosis not present

## 2022-08-06 NOTE — Patient Instructions (Signed)
I had a long d/w patient about his recent stroke, risk for recurrent stroke/TIAs, personally independently reviewed imaging studies and stroke evaluation results and answered questions.Continue aspirin 81 mg daily and Brilinta (ticagrelor) 90 mg bid  for 3 more months and then aspirin alone for secondary stroke prevention and maintain strict control of hypertension with blood pressure goal below 130/90, diabetes with hemoglobin A1c goal below 6.5% and lipids with LDL cholesterol goal below 70 mg/dL. I also advised the patient to eat a healthy diet with plenty of whole grains, cereals, fruits and vegetables, exercise regularly and maintain ideal body weight . Continue physical, occupational and speech therapy.  Followup in the future with me only as needed.  Stroke Prevention Some medical conditions and behaviors can lead to a higher chance of having a stroke. You can help prevent a stroke by eating healthy, exercising, not smoking, and managing any medical conditions you have. Stroke is a leading cause of functional impairment. Primary prevention is particularly important because a majority of strokes are first-time events. Stroke changes the lives of not only those who experience a stroke but also their family and other caregivers. How can this condition affect me? A stroke is a medical emergency and should be treated right away. A stroke can lead to brain damage and can sometimes be life-threatening. If a person gets medical treatment right away, there is a better chance of surviving and recovering from a stroke. What can increase my risk? The following medical conditions may increase your risk of a stroke: Cardiovascular disease. High blood pressure (hypertension). Diabetes. High cholesterol. Sickle cell disease. Blood clotting disorders (hypercoagulable state). Obesity. Sleep disorders (obstructive sleep apnea). Other risk factors include: Being older than age 60. Having a history of blood  clots, stroke, or mini-stroke (transient ischemic attack, TIA). Genetic factors, such as race, ethnicity, or a family history of stroke. Smoking cigarettes or using other tobacco products. Taking birth control pills, especially if you also use tobacco. Heavy use of alcohol or drugs, especially cocaine and methamphetamine. Physical inactivity. What actions can I take to prevent this? Manage your health conditions High cholesterol levels. Eating a healthy diet is important for preventing high cholesterol. If cholesterol cannot be managed through diet alone, you may need to take medicines. Take any prescribed medicines to control your cholesterol as told by your health care provider. Hypertension. To reduce your risk of stroke, try to keep your blood pressure below 130/80. Eating a healthy diet and exercising regularly are important for controlling blood pressure. If these steps are not enough to manage your blood pressure, you may need to take medicines. Take any prescribed medicines to control hypertension as told by your health care provider. Ask your health care provider if you should monitor your blood pressure at home. Have your blood pressure checked every year, even if your blood pressure is normal. Blood pressure increases with age and some medical conditions. Diabetes. Eating a healthy diet and exercising regularly are important parts of managing your blood sugar (glucose). If your blood sugar cannot be managed through diet and exercise, you may need to take medicines. Take any prescribed medicines to control your diabetes as told by your health care provider. Get evaluated for obstructive sleep apnea. Talk to your health care provider about getting a sleep evaluation if you snore a lot or have excessive sleepiness. Make sure that any other medical conditions you have, such as atrial fibrillation or atherosclerosis, are managed. Nutrition Follow instructions from your health care  provider about what to eat or drink to help manage your health condition. These instructions may include: Reducing your daily calorie intake. Limiting how much salt (sodium) you use to 1,500 milligrams (mg) each day. Using only healthy fats for cooking, such as olive oil, canola oil, or sunflower oil. Eating healthy foods. You can do this by: Choosing foods that are high in fiber, such as whole grains, and fresh fruits and vegetables. Eating at least 5 servings of fruits and vegetables a day. Try to fill one-half of your plate with fruits and vegetables at each meal. Choosing lean protein foods, such as lean cuts of meat, poultry without skin, fish, tofu, beans, and nuts. Eating low-fat dairy products. Avoiding foods that are high in sodium. This can help lower blood pressure. Avoiding foods that have saturated fat, trans fat, and cholesterol. This can help prevent high cholesterol. Avoiding processed and prepared foods. Counting your daily carbohydrate intake.  Lifestyle If you drink alcohol: Limit how much you have to: 0-1 drink a day for women who are not pregnant. 0-2 drinks a day for men. Know how much alcohol is in your drink. In the [place] one drink equals one 12 oz bottle of beer (324m), one 5 oz glass of wine (1410m, or one 1 oz glass of hard liquor (442m Do not use any products that contain nicotine or tobacco. These products include cigarettes, chewing tobacco, and vaping devices, such as e-cigarettes. If you need help quitting, ask your health care provider. Avoid secondhand smoke. Do not use drugs. Activity  Try to stay at a healthy weight. Get at least 30 minutes of exercise on most days, such as: Fast walking. Biking. Swimming. Medicines Take over-the-counter and prescription medicines only as told by your health care provider. Aspirin or blood thinners (antiplatelets or anticoagulants) may be recommended to reduce your risk of forming blood clots that can lead to  stroke. Avoid taking birth control pills. Talk to your health care provider about the risks of taking birth control pills if: You are over 35 29ars old. You smoke. You get very bad headaches. You have had a blood clot. Where to find more information American Stroke Association: www.strokeassociation.org Get help right away if: You or a loved one has any symptoms of a stroke. "BE FAST" is an easy way to remember the main warning signs of a stroke: B - Balance. Signs are dizziness, sudden trouble walking, or loss of balance. E - Eyes. Signs are trouble seeing or a sudden change in vision. F - Face. Signs are sudden weakness or numbness of the face, or the face or eyelid drooping on one side. A - Arms. Signs are weakness or numbness in an arm. This happens suddenly and usually on one side of the body. S - Speech. Signs are sudden trouble speaking, slurred speech, or trouble understanding what people say. T - Time. Time to call emergency services. Write down what time symptoms started. You or a loved one has other signs of a stroke, such as: A sudden, severe headache with no known cause. Nausea or vomiting. Seizure. These symptoms may represent a serious problem that is an emergency. Do not wait to see if the symptoms will go away. Get medical help right away. Call your local emergency services (911 in the [place] Do not drive yourself to the hospital. Summary You can help to prevent a stroke by eating healthy, exercising, not smoking, limiting alcohol intake, and managing any medical conditions you may have. Do not  use any products that contain nicotine or tobacco. These include cigarettes, chewing tobacco, and vaping devices, such as e-cigarettes. If you need help quitting, ask your health care provider. Remember "BE FAST" for warning signs of a stroke. Get help right away if you or a loved one has any of these signs. This information is not intended to replace advice given to you by your  health care provider. Make sure you discuss any questions you have with your health care provider. Document Revised: 04/08/2020 Document Reviewed: 04/08/2020 Elsevier Patient Education  [place].

## 2022-08-06 NOTE — Progress Notes (Signed)
[facility] [address] [place]. [place] [address] [phone]       OFFICE FOLLOW-UP NOTE  Ms. Sherri Rad Date of Birth:  01-27-1956 Medical Record Number:  [id number]   HPI: Ms. Migliaccio is a 66 year old African-American lady seen today for initial office visit following hospital admission for stroke in August 2023.  She is accompanied by her sister and son and history is obtained from them and review of electronic medical records I have personally reviewed pertinent available imaging films in PACS.  She has past medical history of diabetes, hypertension, hyperlipidemia and tobacco abuse.  Patient spoke with the family several days prior to the stroke being recognized.  She presented with aphasia and right hemiparesis with unknown last known well hence code stroke was not activated.  Initial head CT showed hyperdense left MCA sign with ASPECT score of 10.  NIH stroke scale was 22.  CT angiogram showed left ICA occlusion in the neck as well as left M1 occlusion CT perfusion showed a core infarct of 62 mm with perfusion deficit of 123.  After discussion with family about risk benefits of revascularization he underwent left ICA history of stenting and angioplasty followed by mechanical thrombectomy of the left M1.  Patient was admitted to the ICU and initially remained intubated MRI scan confirmed patchy infarcts involving left MCA, ACA and left MCA/PCA border zone MRA showed stent reocclusion unfortunately.  Patient did not do well she remains aphasic with right hemiplegia.  Echocardiogram showed ejection fraction of 65 to 70%.  LDL cholesterol was 25 mg percent and hemoglobin A1c of 7.2.  Urine direction was negative.  He was started on aspirin and Brilinta.  He was seen by therapy and recommended to go to SNF for rehabilitation.  He still currently physical occupational speech therapy but unfortunately has not made much progress.  Remains globally aphasic and noted some  vocal sounds and frequently nonsensical words.  Follows daily simple midline commands.  She still has dense right hemiplegia.  She is able to stand with can barely take a few steps.  Family is wondering what to do as insurance will run out soon.  Patient remains on aspirin and Brilinta and is tolerating it well without bruising or bleeding. ROS:   14 system review of systems is positive for aphasia, speech difficulty, weakness, difficulty walking, difficulty understanding all other systems negative  PMH:  Past Medical History:  Diagnosis Date   Arthritis    Cataract    COPD (chronic obstructive pulmonary disease) ([place])    Diabetes mellitus without complication ([place])    Fibrocystic breast disease July 2016   GERD (gastroesophageal reflux disease)    Hyperlipidemia    Hypertension     Social History:  Social History   Socioeconomic History   Marital status: Widowed    Spouse name: Not on file   Number of children: 2   Years of education: 14   Highest education level: Associate degree: occupational, Hotel manager, or vocational program  Occupational History   Occupation: Habilitation tech  Tobacco Use   Smoking status: Every Day    Packs/day: 1.00    Years: 34.00    Total pack years: 34.00    Types: Cigarettes    Passive exposure: Current   Smokeless tobacco: Never   Tobacco comments:    Tobacco info given 10/11/15  Vaping Use   Vaping Use: Never used  Substance and Sexual Activity   Alcohol use: Yes    Comment: socially  Drug use: No   Sexual activity: Not Currently    Birth control/protection: None  Other Topics Concern   Not on file  Social History Narrative   Not on file   Social Determinants of Health   Financial Resource Strain: Low Risk  (10/23/2021)   Overall Financial Resource Strain (CARDIA)    Difficulty of Paying Living Expenses: Not hard at all  Food Insecurity: No Food Insecurity (10/23/2021)   Hunger Vital Sign    Worried About Running Out of Food in the  Last Year: Never true    Ran Out of Food in the Last Year: Never true  Transportation Needs: No Transportation Needs (10/23/2021)   PRAPARE - Hydrologist (Medical): No    Lack of Transportation (Non-Medical): No  Physical Activity: Inactive (10/23/2021)   Exercise Vital Sign    Days of Exercise per Week: 0 days    Minutes of Exercise per Session: 0 min  Stress: Stress Concern Present (10/23/2021)   [place]    Feeling of Stress : Rather much  Social Connections: Moderately Integrated (10/23/2021)   Social Connection and Isolation Panel [NHANES]    Frequency of Communication with Friends and Family: More than three times a week    Frequency of Social Gatherings with Friends and Family: More than three times a week    Attends Religious Services: More than 4 times per year    Active Member of [employer] or Organizations: No    Attends Music therapist: More than 4 times per year    Marital Status: Widowed  Recent Concern: Social Connections - Moderately Isolated (09/25/2021)   Social Connection and Isolation Panel [NHANES]    Frequency of Communication with Friends and Family: More than three times a week    Frequency of Social Gatherings with Friends and Family: Once a week    Attends Religious Services: More than 4 times per year    Active Member of [employer] or Organizations: No    Attends Archivist Meetings: Never    Marital Status: Widowed  Intimate Partner Violence: Not At Risk (10/23/2021)   Humiliation, Afraid, Rape, and Kick questionnaire    Fear of Current or Ex-Partner: No    Emotionally Abused: No    Physically Abused: No    Sexually Abused: No    Medications:   Current Outpatient Medications on File Prior to Visit  Medication Sig Dispense Refill   acetaminophen (TYLENOL) 500 MG tablet Take 1,000 mg by mouth every 6 (six) hours as needed (pain). Reported on 04/08/2016      albuterol (VENTOLIN HFA) 108 (90 Base) MCG/ACT inhaler Inhale 2 puffs into the lungs every 6 (six) hours as needed for wheezing or shortness of breath. 1 each 3   alendronate (FOSAMAX) 70 MG tablet Take 1 tablet (70 mg total) by mouth every 7 (seven) days. Take with a full glass of water on an empty stomach. 12 tablet 3   aspirin 81 MG chewable tablet Chew 1 tablet (81 mg total) by mouth daily. 90 tablet 1   atorvastatin (LIPITOR) 40 MG tablet TAKE 1 TABLET EVERY DAY 90 tablet 1   Boric Acid Vaginal (AZO BORIC ACID) 600 MG SUPP Place 1 suppository vaginally at bedtime. 14 suppository 0   butalbital-acetaminophen-caffeine (FIORICET) 50-325-40 MG tablet Take 1-2 tablets by mouth every 6 (six) hours as needed for headache. Max 6 per day 30 tablet 0   cholestyramine (  QUESTRAN) 4 g packet DISSOLVE & TAKE 1 POWDER PACKET BY MOUTH TWICE DAILY 180 packet 3   cyclobenzaprine (FLEXERIL) 5 MG tablet Take 1 tablet (5 mg total) by mouth at bedtime. Use as needed for neck pain 30 tablet 1   insulin aspart (NOVOLOG) 100 UNIT/ML injection Inject 4 Units into the skin 3 (three) times daily with meals. 10 mL 11   insulin glargine-yfgn (SEMGLEE) 100 UNIT/ML injection Inject 0.24 mLs (24 Units total) into the skin daily. 10 mL 11   lisinopril (ZESTRIL) 40 MG tablet Take 1 tablet (40 mg total) by mouth daily. 90 tablet 3   Multiple Vitamin (MULTIVITAMIN WITH MINERALS) TABS tablet Take 1 tablet by mouth daily. 30 tablet 0   pantoprazole (PROTONIX) 40 MG tablet Take 1 tablet (40 mg total) by mouth 2 (two) times daily. 180 tablet 1   ticagrelor (BRILINTA) 90 MG TABS tablet Take 1 tablet (90 mg total) by mouth 2 (two) times daily. 60 tablet 1   triamcinolone cream (KENALOG) 0.1 % Apply 1 application topically 2 (two) times daily. Use as needed on dry or scaly areas 60 g 1   No current facility-administered medications on file prior to visit.    Allergies:  No Known Allergies  Physical Exam General: well developed,  well nourished middle-aged African-American lady, seated, in no evident distress Head: head normocephalic and atraumatic.  Neck: supple with no carotid or supraclavicular bruits Cardiovascular: regular rate and rhythm, no murmurs Musculoskeletal: no deformity Skin:  no rash/petichiae Vascular:  Normal pulses all extremities There were no vitals filed for this visit. Neurologic Exam Mental Status: Awake and fully alert.  Globally aphasic and follows only occasional midline commands.  Makes some guttural sounds and speaks a few nonsensical words. Cranial Nerves: Fundoscopic exam reveals sharp disc margins. Pupils equal, briskly reactive to light. Extraocular movements full without nystagmus. Visual fields show decreased blink to threat on the right . Hearing intact. Facial sensation intact.  Right lower facial weakness, tongue, palate moves normally and symmetrically.  Motor: Dense right hemiplegia with 0/5 strength with flaccidity.  Right foot drop.  Right foot brace. Sensory.:  Subjectively diminished touch ,pinprick .position and vibratory sensation on the right.  Coordination: Unable to test on the right due to severe weakness severe aphasia. Gait and Station: Deferred as patient is unable to walk.   Reflexes: 1+ and asymmetric and brisker on the right. Toes downgoing.   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  She has significant global aphasia and dense right hemiplegia and has not obtained any substantial improvement yet.  Vascular risk factors of diabetes, hypertension, hyperlipidemia mild carotid stenosis and mild obesity     PLAN: I had a long d/w patient about his  recent stroke, risk for recurrent stroke/TIAs, personally independently reviewed imaging studies and stroke evaluation results and answered questions.Continue aspirin 81 mg daily and Brilinta (ticagrelor) 90 mg bid  for 3 more months and then aspirin alone for secondary stroke prevention and maintain strict control of hypertension with blood pressure goal below 130/90, diabetes with hemoglobin A1c goal below 6.5% and lipids with LDL cholesterol goal below 70 mg/dL. I also advised the patient to eat a healthy diet with plenty of whole  grains, cereals, fruits and vegetables, exercise regularly and maintain ideal body weight . Continue physical, occupational and speech therapy.  Followup in the future with me only as needed. Greater than 50% of time during this 35 minute visit was spent on counseling,explanation of diagnosis MCA infarct, global aphasia and hemiplegia, planning of further management, discussion with patient and family and coordination of care Antony Contras, MD Note: This document was prepared with digital dictation and possible smart phrase technology. Any transcriptional errors that result from this process are unintentional

## 2022-08-07 DIAGNOSIS — I63232 Cerebral infarction due to unspecified occlusion or stenosis of left carotid arteries: Secondary | ICD-10-CM | POA: Diagnosis not present

## 2022-08-07 DIAGNOSIS — M6281 Muscle weakness (generalized): Secondary | ICD-10-CM | POA: Diagnosis not present

## 2022-08-08 DIAGNOSIS — M6281 Muscle weakness (generalized): Secondary | ICD-10-CM | POA: Diagnosis not present

## 2022-08-08 DIAGNOSIS — I63232 Cerebral infarction due to unspecified occlusion or stenosis of left carotid arteries: Secondary | ICD-10-CM | POA: Diagnosis not present

## 2022-08-09 DIAGNOSIS — M6281 Muscle weakness (generalized): Secondary | ICD-10-CM | POA: Diagnosis not present

## 2022-08-09 DIAGNOSIS — I63232 Cerebral infarction due to unspecified occlusion or stenosis of left carotid arteries: Secondary | ICD-10-CM | POA: Diagnosis not present

## 2022-08-10 DIAGNOSIS — I63232 Cerebral infarction due to unspecified occlusion or stenosis of left carotid arteries: Secondary | ICD-10-CM | POA: Diagnosis not present

## 2022-08-10 DIAGNOSIS — M6281 Muscle weakness (generalized): Secondary | ICD-10-CM | POA: Diagnosis not present

## 2022-08-11 DIAGNOSIS — I63232 Cerebral infarction due to unspecified occlusion or stenosis of left carotid arteries: Secondary | ICD-10-CM | POA: Diagnosis not present

## 2022-08-11 DIAGNOSIS — M6281 Muscle weakness (generalized): Secondary | ICD-10-CM | POA: Diagnosis not present

## 2022-08-12 DIAGNOSIS — I69320 Aphasia following cerebral infarction: Secondary | ICD-10-CM | POA: Diagnosis not present

## 2022-08-12 DIAGNOSIS — I10 Essential (primary) hypertension: Secondary | ICD-10-CM | POA: Diagnosis not present

## 2022-08-12 DIAGNOSIS — M6281 Muscle weakness (generalized): Secondary | ICD-10-CM | POA: Diagnosis not present

## 2022-08-12 DIAGNOSIS — I69391 Dysphagia following cerebral infarction: Secondary | ICD-10-CM | POA: Diagnosis not present

## 2022-08-12 DIAGNOSIS — I63232 Cerebral infarction due to unspecified occlusion or stenosis of left carotid arteries: Secondary | ICD-10-CM | POA: Diagnosis not present

## 2022-08-13 DIAGNOSIS — I63232 Cerebral infarction due to unspecified occlusion or stenosis of left carotid arteries: Secondary | ICD-10-CM | POA: Diagnosis not present

## 2022-08-13 DIAGNOSIS — M6281 Muscle weakness (generalized): Secondary | ICD-10-CM | POA: Diagnosis not present

## 2022-08-14 DIAGNOSIS — I63232 Cerebral infarction due to unspecified occlusion or stenosis of left carotid arteries: Secondary | ICD-10-CM | POA: Diagnosis not present

## 2022-08-14 DIAGNOSIS — M6281 Muscle weakness (generalized): Secondary | ICD-10-CM | POA: Diagnosis not present

## 2022-08-15 DIAGNOSIS — I63232 Cerebral infarction due to unspecified occlusion or stenosis of left carotid arteries: Secondary | ICD-10-CM | POA: Diagnosis not present

## 2022-08-15 DIAGNOSIS — M6281 Muscle weakness (generalized): Secondary | ICD-10-CM | POA: Diagnosis not present

## 2022-08-16 DIAGNOSIS — I63232 Cerebral infarction due to unspecified occlusion or stenosis of left carotid arteries: Secondary | ICD-10-CM | POA: Diagnosis not present

## 2022-08-16 DIAGNOSIS — M6281 Muscle weakness (generalized): Secondary | ICD-10-CM | POA: Diagnosis not present

## 2022-08-17 DIAGNOSIS — I63232 Cerebral infarction due to unspecified occlusion or stenosis of left carotid arteries: Secondary | ICD-10-CM | POA: Diagnosis not present

## 2022-08-17 DIAGNOSIS — M6281 Muscle weakness (generalized): Secondary | ICD-10-CM | POA: Diagnosis not present

## 2022-08-18 ENCOUNTER — Telehealth: Payer: Self-pay | Admitting: Neurology

## 2022-08-18 DIAGNOSIS — I63232 Cerebral infarction due to unspecified occlusion or stenosis of left carotid arteries: Secondary | ICD-10-CM | POA: Diagnosis not present

## 2022-08-18 DIAGNOSIS — M6281 Muscle weakness (generalized): Secondary | ICD-10-CM | POA: Diagnosis not present

## 2022-08-18 NOTE — Telephone Encounter (Signed)
I called the pt's sister.  The pt is applying for social security at this time and is needing a letter stating "she is incapable of handling financial affairs due to residual deficit from her stroke".  Will send to MD if ok to sign we will do so.

## 2022-08-18 NOTE — Telephone Encounter (Signed)
Pt sister is calling. 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 She is requesting a call back from nurse.

## 2022-08-19 DIAGNOSIS — M6281 Muscle weakness (generalized): Secondary | ICD-10-CM | POA: Diagnosis not present

## 2022-08-19 DIAGNOSIS — I63232 Cerebral infarction due to unspecified occlusion or stenosis of left carotid arteries: Secondary | ICD-10-CM | POA: Diagnosis not present

## 2022-08-19 DIAGNOSIS — F410 Panic disorder [episodic paroxysmal anxiety] without agoraphobia: Secondary | ICD-10-CM | POA: Diagnosis not present

## 2022-08-19 NOTE — Telephone Encounter (Signed)
Letter completed and placed on MD's desk for review and signature.

## 2022-08-20 DIAGNOSIS — M6281 Muscle weakness (generalized): Secondary | ICD-10-CM | POA: Diagnosis not present

## 2022-08-20 DIAGNOSIS — I63232 Cerebral infarction due to unspecified occlusion or stenosis of left carotid arteries: Secondary | ICD-10-CM | POA: Diagnosis not present

## 2022-08-20 NOTE — Telephone Encounter (Signed)
Called pt's sister and informed her that letter was ready for pickup at the front desk.

## 2022-08-21 DIAGNOSIS — M6281 Muscle weakness (generalized): Secondary | ICD-10-CM | POA: Diagnosis not present

## 2022-08-21 DIAGNOSIS — I63232 Cerebral infarction due to unspecified occlusion or stenosis of left carotid arteries: Secondary | ICD-10-CM | POA: Diagnosis not present

## 2022-08-22 DIAGNOSIS — I63232 Cerebral infarction due to unspecified occlusion or stenosis of left carotid arteries: Secondary | ICD-10-CM | POA: Diagnosis not present

## 2022-08-22 DIAGNOSIS — M6281 Muscle weakness (generalized): Secondary | ICD-10-CM | POA: Diagnosis not present

## 2022-08-23 DIAGNOSIS — M6281 Muscle weakness (generalized): Secondary | ICD-10-CM | POA: Diagnosis not present

## 2022-08-23 DIAGNOSIS — I63232 Cerebral infarction due to unspecified occlusion or stenosis of left carotid arteries: Secondary | ICD-10-CM | POA: Diagnosis not present

## 2022-08-24 DIAGNOSIS — M6281 Muscle weakness (generalized): Secondary | ICD-10-CM | POA: Diagnosis not present

## 2022-08-24 DIAGNOSIS — I63232 Cerebral infarction due to unspecified occlusion or stenosis of left carotid arteries: Secondary | ICD-10-CM | POA: Diagnosis not present

## 2022-08-25 DIAGNOSIS — R109 Unspecified abdominal pain: Secondary | ICD-10-CM | POA: Diagnosis not present

## 2022-08-26 DIAGNOSIS — R1084 Generalized abdominal pain: Secondary | ICD-10-CM | POA: Diagnosis not present

## 2022-08-26 DIAGNOSIS — R451 Restlessness and agitation: Secondary | ICD-10-CM | POA: Diagnosis not present

## 2022-08-26 DIAGNOSIS — R112 Nausea with vomiting, unspecified: Secondary | ICD-10-CM | POA: Diagnosis not present

## 2022-08-26 DIAGNOSIS — N390 Urinary tract infection, site not specified: Secondary | ICD-10-CM | POA: Diagnosis not present

## 2022-08-27 DIAGNOSIS — R112 Nausea with vomiting, unspecified: Secondary | ICD-10-CM | POA: Diagnosis not present

## 2022-08-27 DIAGNOSIS — D72829 Elevated white blood cell count, unspecified: Secondary | ICD-10-CM | POA: Diagnosis not present

## 2022-08-27 DIAGNOSIS — R109 Unspecified abdominal pain: Secondary | ICD-10-CM | POA: Diagnosis not present

## 2022-08-28 ENCOUNTER — Emergency Department (HOSPITAL_COMMUNITY): Payer: Medicare HMO

## 2022-08-28 ENCOUNTER — Other Ambulatory Visit: Payer: Self-pay

## 2022-08-28 ENCOUNTER — Emergency Department (HOSPITAL_COMMUNITY)
Admission: EM | Admit: 2022-08-28 | Discharge: 2022-08-29 | Disposition: A | Discharge status: Other Acute Inpatient Hospital | Payer: Medicare HMO | Attending: Emergency Medicine | Admitting: Emergency Medicine

## 2022-08-28 DIAGNOSIS — R4589 Other symptoms and signs involving emotional state: Secondary | ICD-10-CM | POA: Diagnosis not present

## 2022-08-28 DIAGNOSIS — R1084 Generalized abdominal pain: Secondary | ICD-10-CM | POA: Diagnosis not present

## 2022-08-28 DIAGNOSIS — I63232 Cerebral infarction due to unspecified occlusion or stenosis of left carotid arteries: Secondary | ICD-10-CM | POA: Diagnosis not present

## 2022-08-28 DIAGNOSIS — R109 Unspecified abdominal pain: Secondary | ICD-10-CM

## 2022-08-28 DIAGNOSIS — M6281 Muscle weakness (generalized): Secondary | ICD-10-CM | POA: Diagnosis not present

## 2022-08-28 DIAGNOSIS — N3289 Other specified disorders of bladder: Secondary | ICD-10-CM | POA: Diagnosis not present

## 2022-08-28 DIAGNOSIS — R112 Nausea with vomiting, unspecified: Secondary | ICD-10-CM | POA: Diagnosis not present

## 2022-08-28 DIAGNOSIS — I10 Essential (primary) hypertension: Secondary | ICD-10-CM | POA: Diagnosis not present

## 2022-08-28 DIAGNOSIS — Z1152 Encounter for screening for COVID-19: Secondary | ICD-10-CM | POA: Diagnosis not present

## 2022-08-28 DIAGNOSIS — R000 Tachycardia, unspecified: Secondary | ICD-10-CM | POA: Diagnosis not present

## 2022-08-28 DIAGNOSIS — N390 Urinary tract infection, site not specified: Secondary | ICD-10-CM | POA: Diagnosis not present

## 2022-08-28 LAB — URINALYSIS, ROUTINE W REFLEX MICROSCOPIC
Bilirubin Urine: NEGATIVE
Glucose, UA: NEGATIVE mg/dL
Hgb urine dipstick: NEGATIVE
Ketones, ur: NEGATIVE mg/dL
Leukocytes,Ua: NEGATIVE
Nitrite: NEGATIVE
Protein, ur: NEGATIVE mg/dL
Specific Gravity, Urine: 1.025 (ref 1.005–1.030)
pH: 5 (ref 5.0–8.0)

## 2022-08-28 LAB — CBC WITH DIFFERENTIAL/PLATELET
Abs Immature Granulocytes: 0.03 10*3/uL (ref 0.00–0.07)
Basophils Absolute: 0.1 10*3/uL (ref 0.0–0.1)
Basophils Relative: 0 %
Eosinophils Absolute: 0.5 10*3/uL (ref 0.0–0.5)
Eosinophils Relative: 4 %
HCT: 32.5 % — ABNORMAL LOW (ref 36.0–46.0)
Hemoglobin: 10.1 g/dL — ABNORMAL LOW (ref 12.0–15.0)
Immature Granulocytes: 0 %
Lymphocytes Relative: 32 %
Lymphs Abs: 3.6 10*3/uL (ref 0.7–4.0)
MCH: 21.9 pg — ABNORMAL LOW (ref 26.0–34.0)
MCHC: 31.1 g/dL (ref 30.0–36.0)
MCV: 70.5 fL — ABNORMAL LOW (ref 80.0–100.0)
Monocytes Absolute: 0.9 10*3/uL (ref 0.1–1.0)
Monocytes Relative: 8 %
Neutro Abs: 6.2 10*3/uL (ref 1.7–7.7)
Neutrophils Relative %: 56 %
Platelets: 356 10*3/uL (ref 150–400)
RBC: 4.61 MIL/uL (ref 3.87–5.11)
RDW: 17.2 % — ABNORMAL HIGH (ref 11.5–15.5)
WBC: 11.2 10*3/uL — ABNORMAL HIGH (ref 4.0–10.5)
nRBC: 0 % (ref 0.0–0.2)

## 2022-08-28 LAB — COMPREHENSIVE METABOLIC PANEL
ALT: 17 U/L (ref 0–44)
AST: 18 U/L (ref 15–41)
Albumin: 3 g/dL — ABNORMAL LOW (ref 3.5–5.0)
Alkaline Phosphatase: 86 U/L (ref 38–126)
Anion gap: 7 (ref 5–15)
BUN: 11 mg/dL (ref 8–23)
CO2: 25 mmol/L (ref 22–32)
Calcium: 9.2 mg/dL (ref 8.9–10.3)
Chloride: 107 mmol/L (ref 98–111)
Creatinine, Ser: 0.64 mg/dL (ref 0.44–1.00)
GFR, Estimated: >60 mL/min (ref >60)
Glucose, Bld: 188 mg/dL — ABNORMAL HIGH (ref 70–99)
Potassium: 3.4 mmol/L — ABNORMAL LOW (ref 3.5–5.1)
Sodium: 139 mmol/L (ref 135–145)
Total Bilirubin: 0.3 mg/dL (ref 0.3–1.2)
Total Protein: 7.2 g/dL (ref 6.5–8.1)

## 2022-08-28 LAB — RESP PANEL BY RT-PCR (FLU A&B, COVID) ARPGX2
Influenza A by PCR: NEGATIVE
Influenza B by PCR: NEGATIVE
SARS Coronavirus 2 by RT PCR: NEGATIVE

## 2022-08-28 LAB — LACTIC ACID, PLASMA: Lactic Acid, Venous: 1.4 mmol/L (ref 0.5–1.9)

## 2022-08-28 LAB — LIPASE, BLOOD: Lipase: 46 U/L (ref 11–51)

## 2022-08-28 MED ORDER — ONDANSETRON HCL 4 MG/2ML IJ SOLN
4.0000 mg | Freq: Once | INTRAMUSCULAR | Status: AC
  Administered 2022-08-28: 4 mg via INTRAVENOUS
  Filled 2022-08-28: qty 2

## 2022-08-28 MED ORDER — SODIUM CHLORIDE 0.9 % IV BOLUS
500.0000 mL | Freq: Once | INTRAVENOUS | Status: AC
  Administered 2022-08-28: 500 mL via INTRAVENOUS

## 2022-08-28 MED ORDER — IOHEXOL 300 MG/ML  SOLN
100.0000 mL | Freq: Once | INTRAMUSCULAR | Status: AC | PRN
  Administered 2022-08-28: 100 mL via INTRAVENOUS

## 2022-08-28 NOTE — Discharge Instructions (Addendum)
Your history, exam, evaluation did not find any acute abnormalities causing the abdominal pain.  Given the recent nausea, vomiting, and diarrhea as reported to me from your facility, I do suspect he may have had a recent viral gastroenteritis that led to some abdominal discomfort.  The CT scan showed no clear acute cause of your abdominal discomfort that was reported by the facility and your urinalysis did not show evidence of infection.  Incidentally on the imaging ,there was evidence of asymmetric bladder wall thickening may need further evaluation as an outpatient and there was a density in your left kidney that also may need imaging as an outpatient.  Given your otherwise reassuring labs, vital signs, and well appearance we do feel you are safe for discharge home.  Please follow-up with your primary doctor.  If any symptoms change or worsen acutely, please return to the nearest emergency department.

## 2022-08-28 NOTE — ED Provider Notes (Signed)
[facility] DEPT Provider Note   CSN: [id number] Arrival date & time: 08/28/22  1518     History  Chief Complaint  Patient presents with   Abdominal Pain    Bailey [name] is a 66 y.o. female.  The history is provided by medical records and a caregiver (spoke to facilility). The history is limited by the condition of the patient.  Abdominal Pain Pain location:  Generalized Pain severity:  Unable to specify Onset quality:  Gradual Timing:  Unable to specify Progression:  Unable to specify Chronicity:  New Relieved by:  Nothing Worsened by:  Palpation Ineffective treatments:  None tried Associated symptoms: diarrhea, nausea and vomiting   Associated symptoms: no chest pain, no chills, no constipation, no cough and no fever    LVL 5 caveat for aphasia and nonverbal status     Home Medications Prior to Admission medications   Medication Sig Start Date End Date Taking? Authorizing Provider  acetaminophen (TYLENOL) 500 MG tablet Take 1,000 mg by mouth every 6 (six) hours as needed (pain). Reported on 04/08/2016    [provider]  albuterol (VENTOLIN HFA) 108 (90 Base) MCG/ACT inhaler Inhale 2 puffs into the lungs every 6 (six) hours as needed for wheezing or shortness of breath. 08/21/21   Copland, Gay Filler, MD  alendronate (FOSAMAX) 70 MG tablet Take 1 tablet (70 mg total) by mouth every 7 (seven) days. Take with a full glass of water on an empty stomach. 01/05/22   Copland, Gay Filler, MD  aspirin 81 MG chewable tablet Chew 1 tablet (81 mg total) by mouth daily. 05/29/22   Rosalin Hawking, MD  atorvastatin (LIPITOR) 40 MG tablet TAKE 1 TABLET EVERY DAY 12/12/21   Copland, Gay Filler, MD  Boric Acid Vaginal (AZO BORIC ACID) 600 MG SUPP Place 1 suppository vaginally at bedtime. 01/02/22   Shelly Bombard, MD  butalbital-acetaminophen-caffeine (FIORICET) [phone] MG tablet Take 1-2 tablets by mouth every 6 (six) hours as needed for headache.  Max 6 per day 04/16/22   Copland, Gay Filler, MD  cholestyramine (QUESTRAN) 4 g packet DISSOLVE & TAKE 1 POWDER PACKET BY MOUTH TWICE DAILY 02/19/21   Copland, Gay Filler, MD  cyclobenzaprine (FLEXERIL) 5 MG tablet Take 1 tablet (5 mg total) by mouth at bedtime. Use as needed for neck pain 07/02/21   Copland, Gay Filler, MD  insulin aspart (NOVOLOG) 100 UNIT/ML injection Inject 4 Units into the skin 3 (three) times daily with meals. 05/28/22   Rosalin Hawking, MD  insulin glargine-yfgn (SEMGLEE) 100 UNIT/ML injection Inject 0.24 mLs (24 Units total) into the skin daily. 05/29/22   Rosalin Hawking, MD  lisinopril (ZESTRIL) 40 MG tablet Take 1 tablet (40 mg total) by mouth daily. 04/16/22   Copland, Gay Filler, MD  Multiple Vitamin (MULTIVITAMIN WITH MINERALS) TABS tablet Take 1 tablet by mouth daily. 05/29/22   Rosalin Hawking, MD  pantoprazole (PROTONIX) 40 MG tablet Take 1 tablet (40 mg total) by mouth 2 (two) times daily. 02/20/22   Copland, Gay Filler, MD  ticagrelor (BRILINTA) 90 MG TABS tablet Take 1 tablet (90 mg total) by mouth 2 (two) times daily. 05/28/22   Rosalin Hawking, MD  triamcinolone cream (KENALOG) 0.1 % Apply 1 application topically 2 (two) times daily. Use as needed on dry or scaly areas 03/30/19   Copland, Gay Filler, MD      Allergies    Patient has no known allergies.    Review of Systems   Review  of Systems  Unable to perform ROS: Patient nonverbal  Constitutional:  Negative for chills and fever.  Respiratory:  Negative for cough.   Cardiovascular:  Negative for chest pain.  Gastrointestinal:  Positive for abdominal pain, diarrhea, nausea and vomiting. Negative for constipation.    Physical Exam Updated Vital Signs Ht '5\' 5"'$  (1.651 m)   Wt 86 kg   BMI 31.55 kg/m  Physical Exam Vitals and nursing note reviewed.  Constitutional:      General: She is not in acute distress.    Appearance: She is well-developed. She is not ill-appearing, toxic-appearing or diaphoretic.  HENT:     Head: Normocephalic  and atraumatic.     Nose: Nose normal.     Mouth/Throat:     Mouth: Mucous membranes are dry.  Eyes:     Conjunctiva/sclera: Conjunctivae normal.  Cardiovascular:     Rate and Rhythm: Regular rhythm. Tachycardia present.     Heart sounds: No murmur heard. Pulmonary:     Effort: Pulmonary effort is normal. No respiratory distress.     Breath sounds: Normal breath sounds. No wheezing, rhonchi or rales.  Chest:     Chest wall: No tenderness.  Abdominal:     General: Bowel sounds are normal.     Palpations: Abdomen is soft.     Tenderness: There is generalized abdominal tenderness. There is no guarding or rebound.  Musculoskeletal:        General: No swelling or tenderness.     Cervical back: Neck supple.  Skin:    General: Skin is warm and dry.     Capillary Refill: Capillary refill takes less than 2 seconds.  Neurological:     Mental Status: She is alert. Mental status is at baseline.     Comments: R sided weakness in face, arm leg at baseline per facility and nursing report from EMS     ED Results / Procedures / Treatments   Labs (all labs ordered are listed, but only abnormal results are displayed) Labs Reviewed  CBC WITH DIFFERENTIAL/PLATELET - Abnormal; Notable for the following components:      Result Value   WBC 11.2 (*)    Hemoglobin 10.1 (*)    HCT 32.5 (*)    MCV 70.5 (*)    MCH 21.9 (*)    RDW 17.2 (*)    All other components within normal limits  COMPREHENSIVE METABOLIC PANEL - Abnormal; Notable for the following components:   Potassium 3.4 (*)    Glucose, Bld 188 (*)    Albumin 3.0 (*)    All other components within normal limits  RESP PANEL BY RT-PCR (FLU A&B, COVID) ARPGX2  LACTIC ACID, PLASMA  LIPASE, BLOOD  URINALYSIS, ROUTINE W REFLEX MICROSCOPIC  LACTIC ACID, PLASMA    EKG EKG Interpretation  Date/Time:  Friday August 28 2022 15:34:49 EST Ventricular Rate:  101 PR Interval:  160 QRS Duration: 82 QT Interval:  349 QTC Calculation: 453 R  Axis:   -3 Text Interpretation: Sinus tachycardia LVH with secondary repolarization abnormality when compared to prior, similar appearance. No STEMI Confirmed by Antony Blackbird [phone]) on 08/28/2022 3:42:51 PM  Radiology CT ABDOMEN PELVIS W CONTRAST  Result Date: 08/28/2022 CLINICAL DATA:  Nausea vomiting diarrhea EXAM: CT ABDOMEN AND PELVIS WITH CONTRAST TECHNIQUE: Multidetector CT imaging of the abdomen and pelvis was performed using the standard protocol following bolus administration of intravenous contrast. RADIATION DOSE REDUCTION: This exam was performed according to the departmental dose-optimization program which includes automated  exposure control, adjustment of the mA and/or kV according to patient size and/or use of iterative reconstruction technique. CONTRAST:  182m OMNIPAQUE IOHEXOL 300 MG/ML  SOLN COMPARISON:  Radiograph 05/19/2022 FINDINGS: Lower chest: Lung bases demonstrate no acute airspace disease. Hepatobiliary: No focal liver abnormality is seen. Status post cholecystectomy. No biliary dilatation. Pancreas: Unremarkable. No pancreatic ductal dilatation or surrounding inflammatory changes. Spleen: Normal in size without focal abnormality. Adrenals/Urinary Tract: Adrenal glands are within normal limits. Kidneys show no hydronephrosis. Possible small enhancing lesion posterior cortex midpole left kidney measuring 14 mm, increased density values on delayed images. Thick-walled urinary bladder slightly asymmetric to the posterior wall and roof. Stomach/Bowel: Stomach is within normal limits. Appendix appears normal. No evidence of bowel wall thickening, distention, or inflammatory changes. Vascular/Lymphatic: Moderate aortic atherosclerosis. No aneurysm. No suspicious lymph nodes Reproductive: Calcified uterine fibroids.  No adnexal mass Other: Negative for pelvic effusion or free air Musculoskeletal: No acute or significant osseous findings. IMPRESSION: 1. No CT evidence for acute  intra-abdominal or pelvic abnormality. 2. Thick-walled urinary bladder slightly asymmetric to the posterior wall and roof, could be due to infection/inflammation or mass. Recommend correlation with urinalysis. 3. Possible small enhancing lesion posterior cortex mid left kidney measuring 14 mm, When the patient is clinically stable and able to follow directions and hold their breath (preferably as an outpatient) further evaluation with dedicated abdominal MRI should be considered. 4. Calcified uterine fibroids. 5. Aortic atherosclerosis. Aortic Atherosclerosis (ICD10-I70.0). Electronically Signed   By: KDonavan FoilM.D.   On: 08/28/2022 19:28    Procedures Procedures    Medications Ordered in ED Medications  ondansetron (ZOFRAN) injection 4 mg (4 mg Intravenous Given 08/28/22 1642)  sodium chloride 0.9 % bolus 500 mL (500 mLs Intravenous New Bag/Given 08/28/22 1650)  iohexol (OMNIPAQUE) 300 MG/ML solution 100 mL (100 mLs Intravenous Contrast Given 08/28/22 1848)    ED Course/ Medical Decision Making/ A&P                           Medical Decision Making Amount and/or Complexity of Data Reviewed Labs: ordered. Radiology: ordered.  Risk Prescription drug management.    SRashon Rezekis a 66y.o. female with a past medical history significant for previous stroke with right-sided weakness and aphasia, hypertension, hyperlipidemia, GERD, diabetes, and COPD who [facility] for nausea, vomiting, diarrhea, and abdominal pain.  [facility] I [facility] with, patient had nausea vomiting and diarrhea for the last few days.  Patient was then seen by this nurse and she was apparently doubled over crying with abdominal pain today.  Patient is minimally verbal due to the previous stroke and is very aphasic but was indicating that her abdomen is hurting her.  She was still having bowel movements.  Patient is also tachycardic and  ill-appearing.  Facility reports that they did do some screening lab work and a KUB but due to the continued pain center for further evaluation.  Otherwise they did not report any other urinary changes, trauma, or other complaints from her baseline.  Patient unable to give any explanation of symptoms given her aphasia.  On exam, lungs clear and chest nontender.  Abdomen does appear tender and patient grimaces with palpation diffusely.  Bowel sounds are appreciated.  Patient not moving her right arm and right leg and has right facial droop which sounds similar to baseline based on documentation.  Patient is  able to move her left side.  She repeats the same words over and over but cannot answer questions as to if she is hurting.  Otherwise exam unremarkable.  She does have dry mucous membranes.  Clinically I do feel need to rule out intra-abdominal pathology such as diverticulitis, obstruction, colitis, appendicitis, or other acute cause.  It does appear patient's had a previous cholecystectomy.  Given her history of GERD, she could have reflux, ulcer, and given the GI symptoms could have a viral gastroenteritis that is been going to the community.  Will give some fluids and nausea medicine and get labs and CT imaging.  Anticipate reassessment after workup to determine disposition.  CT scan did not show acute appendicitis, obstruction, diverticulitis, or other acute abnormality.  She did have likely incidental bladder wall thickening asymmetrically as her urine did not show tensive UTI.  Patient will likely outpatient follow-up for this and a renal hyperdensity.  I informed patient of these findings and given her lack of pain.  She was felt stable for discharge home.  I suspect she has a viral gastroenteritis causing nausea vomiting, diarrhea, and pain given otherwise reassuring workup.    Patient be discharged back to facility for outpatient follow-up.         Final Clinical Impression(s) /  ED Diagnoses Final diagnoses:  Abdominal pain, unspecified abdominal location    Rx / DC Orders ED Discharge Orders     None      Clinical Impression: 1. Abdominal pain, unspecified abdominal location     Disposition: Discharge  Condition: Good  I have discussed the results, Dx and Tx plan with the pt(& family if present). He/she/they expressed understanding and agree(s) with the plan. Discharge instructions discussed at great length. Strict return precautions discussed and pt &/or family have verbalized understanding of the instructions. No further questions at time of discharge.    New Prescriptions   No medications on file    Follow Up: Janeice Robinson, MD [place] [address] [facility] [address] [id number] [place]       Jered Heiny, Gwenyth Allegra, MD 08/28/22 [phone]

## 2022-08-28 NOTE — ED Triage Notes (Signed)
Pt BIB EMS from [facility] per facility pt keeps pointing at her belly staff assumes she is having abd pain. Pt is non verbal.

## 2022-08-29 DIAGNOSIS — Z7401 Bed confinement status: Secondary | ICD-10-CM | POA: Diagnosis not present

## 2022-08-29 DIAGNOSIS — R1084 Generalized abdominal pain: Secondary | ICD-10-CM | POA: Diagnosis not present

## 2022-08-29 DIAGNOSIS — M6281 Muscle weakness (generalized): Secondary | ICD-10-CM | POA: Diagnosis not present

## 2022-08-29 DIAGNOSIS — R404 Transient alteration of awareness: Secondary | ICD-10-CM | POA: Diagnosis not present

## 2022-08-29 DIAGNOSIS — I63232 Cerebral infarction due to unspecified occlusion or stenosis of left carotid arteries: Secondary | ICD-10-CM | POA: Diagnosis not present

## 2022-08-29 NOTE — ED Notes (Signed)
Pt left with [facility] at midnight.

## 2022-08-31 DIAGNOSIS — I63232 Cerebral infarction due to unspecified occlusion or stenosis of left carotid arteries: Secondary | ICD-10-CM | POA: Diagnosis not present

## 2022-08-31 DIAGNOSIS — R112 Nausea with vomiting, unspecified: Secondary | ICD-10-CM | POA: Diagnosis not present

## 2022-08-31 DIAGNOSIS — M6281 Muscle weakness (generalized): Secondary | ICD-10-CM | POA: Diagnosis not present

## 2022-08-31 DIAGNOSIS — R1084 Generalized abdominal pain: Secondary | ICD-10-CM | POA: Diagnosis not present

## 2022-08-31 DIAGNOSIS — A084 Viral intestinal infection, unspecified: Secondary | ICD-10-CM | POA: Diagnosis not present

## 2022-09-01 DIAGNOSIS — I63232 Cerebral infarction due to unspecified occlusion or stenosis of left carotid arteries: Secondary | ICD-10-CM | POA: Diagnosis not present

## 2022-09-01 DIAGNOSIS — M6281 Muscle weakness (generalized): Secondary | ICD-10-CM | POA: Diagnosis not present

## 2022-09-02 DIAGNOSIS — M6281 Muscle weakness (generalized): Secondary | ICD-10-CM | POA: Diagnosis not present

## 2022-09-02 DIAGNOSIS — I63232 Cerebral infarction due to unspecified occlusion or stenosis of left carotid arteries: Secondary | ICD-10-CM | POA: Diagnosis not present

## 2022-09-03 ENCOUNTER — Telehealth (HOSPITAL_BASED_OUTPATIENT_CLINIC_OR_DEPARTMENT_OTHER): Payer: Self-pay | Admitting: *Deleted

## 2022-09-03 DIAGNOSIS — M6281 Muscle weakness (generalized): Secondary | ICD-10-CM | POA: Diagnosis not present

## 2022-09-03 DIAGNOSIS — F419 Anxiety disorder, unspecified: Secondary | ICD-10-CM | POA: Diagnosis not present

## 2022-09-03 DIAGNOSIS — I63232 Cerebral infarction due to unspecified occlusion or stenosis of left carotid arteries: Secondary | ICD-10-CM | POA: Diagnosis not present

## 2022-09-03 DIAGNOSIS — F331 Major depressive disorder, recurrent, moderate: Secondary | ICD-10-CM | POA: Diagnosis not present

## 2022-09-03 DIAGNOSIS — R41841 Cognitive communication deficit: Secondary | ICD-10-CM | POA: Diagnosis not present

## 2022-09-03 NOTE — Telephone Encounter (Signed)
Attempted to call pt to discuss scheduling surgery that was discussed in the past with Dr. Wannetta Sender. Unable to leave message as number not in service.

## 2022-09-03 NOTE — Telephone Encounter (Signed)
-----   Message from Jaquita Folds, MD sent at 05/08/2022 10:39 AM EDT ----- Regarding: schedule surgery Please schedule surgery. Will need RNFA assist. Does not need a pre op if within 30 days. Pt is available on 8/28 if its still possible to book on that date, otherwise was looking at 9/18. If done 9/18, can you make her the first patient? Will need 6 week post op.   Procedure: total vaginal hysterectomy with bilateral salpingo-oophorectomy, uterosacral ligament suspension, anterior and posterior repair, perineorrhaphy, cystoscopy  Dx: N81.3, N81.1, N81.6 Time needed: 3 hours Outpatient with extended recovery.  General anesthesia

## 2022-09-04 DIAGNOSIS — M6281 Muscle weakness (generalized): Secondary | ICD-10-CM | POA: Diagnosis not present

## 2022-09-04 DIAGNOSIS — I63232 Cerebral infarction due to unspecified occlusion or stenosis of left carotid arteries: Secondary | ICD-10-CM | POA: Diagnosis not present

## 2022-09-06 DIAGNOSIS — M6281 Muscle weakness (generalized): Secondary | ICD-10-CM | POA: Diagnosis not present

## 2022-09-06 DIAGNOSIS — I63232 Cerebral infarction due to unspecified occlusion or stenosis of left carotid arteries: Secondary | ICD-10-CM | POA: Diagnosis not present

## 2022-09-07 ENCOUNTER — Other Ambulatory Visit: Payer: Self-pay

## 2022-09-07 DIAGNOSIS — I63232 Cerebral infarction due to unspecified occlusion or stenosis of left carotid arteries: Secondary | ICD-10-CM | POA: Diagnosis not present

## 2022-09-07 DIAGNOSIS — M6281 Muscle weakness (generalized): Secondary | ICD-10-CM | POA: Diagnosis not present

## 2022-09-07 NOTE — Patient Outreach (Signed)
First telephone outreach attempt to obtain mRS. No answer. Unable to leave message for returned call.  Quana Chamberlain [facility] Management Assistant [phone]  

## 2022-09-08 ENCOUNTER — Other Ambulatory Visit: Payer: Self-pay

## 2022-09-08 DIAGNOSIS — I63232 Cerebral infarction due to unspecified occlusion or stenosis of left carotid arteries: Secondary | ICD-10-CM | POA: Diagnosis not present

## 2022-09-08 DIAGNOSIS — M6281 Muscle weakness (generalized): Secondary | ICD-10-CM | POA: Diagnosis not present

## 2022-09-08 NOTE — Patient Outreach (Signed)
Second telephone outreach attempt to obtain mRS. Spoke with nurse at SNF but was not able to answer questions as had no way to confirm I was with [facility].  Philmore Pali [facility] Management Assistant [phone]

## 2022-09-09 ENCOUNTER — Other Ambulatory Visit: Payer: Self-pay

## 2022-09-09 DIAGNOSIS — R1084 Generalized abdominal pain: Secondary | ICD-10-CM | POA: Diagnosis not present

## 2022-09-09 DIAGNOSIS — I63232 Cerebral infarction due to unspecified occlusion or stenosis of left carotid arteries: Secondary | ICD-10-CM | POA: Diagnosis not present

## 2022-09-09 DIAGNOSIS — I69951 Hemiplegia and hemiparesis following unspecified cerebrovascular disease affecting right dominant side: Secondary | ICD-10-CM | POA: Diagnosis not present

## 2022-09-09 DIAGNOSIS — M6281 Muscle weakness (generalized): Secondary | ICD-10-CM | POA: Diagnosis not present

## 2022-09-09 NOTE — Patient Outreach (Signed)
3 outreach attempts were completed to obtain mRs. mRs could not be obtained because patient never returned my calls. mRs=7    Prudence Heiny Care Management Assistant [phone]  

## 2022-09-10 DIAGNOSIS — M6281 Muscle weakness (generalized): Secondary | ICD-10-CM | POA: Diagnosis not present

## 2022-09-10 DIAGNOSIS — I63232 Cerebral infarction due to unspecified occlusion or stenosis of left carotid arteries: Secondary | ICD-10-CM | POA: Diagnosis not present

## 2022-09-11 NOTE — Progress Notes (Signed)
Chart review shows that patient was having significant GI symptoms and was tachycardic prompting workup to rule out infectious etiologies such as viral illnesses like COVID and flu which may necessitate further different management.

## 2022-09-13 DIAGNOSIS — M6281 Muscle weakness (generalized): Secondary | ICD-10-CM | POA: Diagnosis not present

## 2022-09-13 DIAGNOSIS — I63232 Cerebral infarction due to unspecified occlusion or stenosis of left carotid arteries: Secondary | ICD-10-CM | POA: Diagnosis not present

## 2022-09-16 DIAGNOSIS — I63232 Cerebral infarction due to unspecified occlusion or stenosis of left carotid arteries: Secondary | ICD-10-CM | POA: Diagnosis not present

## 2022-09-16 DIAGNOSIS — M6281 Muscle weakness (generalized): Secondary | ICD-10-CM | POA: Diagnosis not present

## 2022-09-17 DIAGNOSIS — M6281 Muscle weakness (generalized): Secondary | ICD-10-CM | POA: Diagnosis not present

## 2022-09-17 DIAGNOSIS — I63232 Cerebral infarction due to unspecified occlusion or stenosis of left carotid arteries: Secondary | ICD-10-CM | POA: Diagnosis not present

## 2022-09-18 DIAGNOSIS — M6281 Muscle weakness (generalized): Secondary | ICD-10-CM | POA: Diagnosis not present

## 2022-09-18 DIAGNOSIS — I63232 Cerebral infarction due to unspecified occlusion or stenosis of left carotid arteries: Secondary | ICD-10-CM | POA: Diagnosis not present

## 2022-09-19 DIAGNOSIS — M6281 Muscle weakness (generalized): Secondary | ICD-10-CM | POA: Diagnosis not present

## 2022-09-19 DIAGNOSIS — I63232 Cerebral infarction due to unspecified occlusion or stenosis of left carotid arteries: Secondary | ICD-10-CM | POA: Diagnosis not present

## 2022-09-22 DIAGNOSIS — D171 Benign lipomatous neoplasm of skin and subcutaneous tissue of trunk: Secondary | ICD-10-CM | POA: Diagnosis not present

## 2022-09-22 DIAGNOSIS — I63232 Cerebral infarction due to unspecified occlusion or stenosis of left carotid arteries: Secondary | ICD-10-CM | POA: Diagnosis not present

## 2022-09-22 DIAGNOSIS — M6281 Muscle weakness (generalized): Secondary | ICD-10-CM | POA: Diagnosis not present

## 2022-09-22 DIAGNOSIS — Z23 Encounter for immunization: Secondary | ICD-10-CM | POA: Diagnosis not present

## 2022-09-23 DIAGNOSIS — Z23 Encounter for immunization: Secondary | ICD-10-CM | POA: Diagnosis not present

## 2022-09-23 DIAGNOSIS — M6281 Muscle weakness (generalized): Secondary | ICD-10-CM | POA: Diagnosis not present

## 2022-09-23 DIAGNOSIS — I63232 Cerebral infarction due to unspecified occlusion or stenosis of left carotid arteries: Secondary | ICD-10-CM | POA: Diagnosis not present

## 2022-09-24 DIAGNOSIS — Z23 Encounter for immunization: Secondary | ICD-10-CM | POA: Diagnosis not present

## 2022-09-24 DIAGNOSIS — M6281 Muscle weakness (generalized): Secondary | ICD-10-CM | POA: Diagnosis not present

## 2022-09-24 DIAGNOSIS — I63232 Cerebral infarction due to unspecified occlusion or stenosis of left carotid arteries: Secondary | ICD-10-CM | POA: Diagnosis not present

## 2022-09-25 DIAGNOSIS — Z23 Encounter for immunization: Secondary | ICD-10-CM | POA: Diagnosis not present

## 2022-09-25 DIAGNOSIS — I63232 Cerebral infarction due to unspecified occlusion or stenosis of left carotid arteries: Secondary | ICD-10-CM | POA: Diagnosis not present

## 2022-09-25 DIAGNOSIS — L602 Onychogryphosis: Secondary | ICD-10-CM | POA: Diagnosis not present

## 2022-09-25 DIAGNOSIS — M6281 Muscle weakness (generalized): Secondary | ICD-10-CM | POA: Diagnosis not present

## 2022-09-26 DIAGNOSIS — I63232 Cerebral infarction due to unspecified occlusion or stenosis of left carotid arteries: Secondary | ICD-10-CM | POA: Diagnosis not present

## 2022-09-26 DIAGNOSIS — Z23 Encounter for immunization: Secondary | ICD-10-CM | POA: Diagnosis not present

## 2022-09-26 DIAGNOSIS — M6281 Muscle weakness (generalized): Secondary | ICD-10-CM | POA: Diagnosis not present

## 2022-09-28 DIAGNOSIS — M6281 Muscle weakness (generalized): Secondary | ICD-10-CM | POA: Diagnosis not present

## 2022-09-28 DIAGNOSIS — Z23 Encounter for immunization: Secondary | ICD-10-CM | POA: Diagnosis not present

## 2022-09-28 DIAGNOSIS — I63232 Cerebral infarction due to unspecified occlusion or stenosis of left carotid arteries: Secondary | ICD-10-CM | POA: Diagnosis not present

## 2022-09-29 ENCOUNTER — Ambulatory Visit: Payer: Medicare HMO

## 2022-09-29 DIAGNOSIS — M6281 Muscle weakness (generalized): Secondary | ICD-10-CM | POA: Diagnosis not present

## 2022-09-29 DIAGNOSIS — Z23 Encounter for immunization: Secondary | ICD-10-CM | POA: Diagnosis not present

## 2022-09-29 DIAGNOSIS — I63232 Cerebral infarction due to unspecified occlusion or stenosis of left carotid arteries: Secondary | ICD-10-CM | POA: Diagnosis not present

## 2022-09-30 DIAGNOSIS — Z23 Encounter for immunization: Secondary | ICD-10-CM | POA: Diagnosis not present

## 2022-09-30 DIAGNOSIS — I63232 Cerebral infarction due to unspecified occlusion or stenosis of left carotid arteries: Secondary | ICD-10-CM | POA: Diagnosis not present

## 2022-09-30 DIAGNOSIS — M6281 Muscle weakness (generalized): Secondary | ICD-10-CM | POA: Diagnosis not present

## 2022-10-02 DIAGNOSIS — Z23 Encounter for immunization: Secondary | ICD-10-CM | POA: Diagnosis not present

## 2022-10-02 DIAGNOSIS — M6281 Muscle weakness (generalized): Secondary | ICD-10-CM | POA: Diagnosis not present

## 2022-10-02 DIAGNOSIS — I63232 Cerebral infarction due to unspecified occlusion or stenosis of left carotid arteries: Secondary | ICD-10-CM | POA: Diagnosis not present

## 2022-10-03 DIAGNOSIS — I63232 Cerebral infarction due to unspecified occlusion or stenosis of left carotid arteries: Secondary | ICD-10-CM | POA: Diagnosis not present

## 2022-10-03 DIAGNOSIS — Z23 Encounter for immunization: Secondary | ICD-10-CM | POA: Diagnosis not present

## 2022-10-03 DIAGNOSIS — M6281 Muscle weakness (generalized): Secondary | ICD-10-CM | POA: Diagnosis not present

## 2022-10-04 DIAGNOSIS — M6281 Muscle weakness (generalized): Secondary | ICD-10-CM | POA: Diagnosis not present

## 2022-10-04 DIAGNOSIS — Z23 Encounter for immunization: Secondary | ICD-10-CM | POA: Diagnosis not present

## 2022-10-04 DIAGNOSIS — I63232 Cerebral infarction due to unspecified occlusion or stenosis of left carotid arteries: Secondary | ICD-10-CM | POA: Diagnosis not present

## 2022-10-06 DIAGNOSIS — M6281 Muscle weakness (generalized): Secondary | ICD-10-CM | POA: Diagnosis not present

## 2022-10-06 DIAGNOSIS — Z23 Encounter for immunization: Secondary | ICD-10-CM | POA: Diagnosis not present

## 2022-10-06 DIAGNOSIS — I63232 Cerebral infarction due to unspecified occlusion or stenosis of left carotid arteries: Secondary | ICD-10-CM | POA: Diagnosis not present

## 2022-10-07 DIAGNOSIS — M6281 Muscle weakness (generalized): Secondary | ICD-10-CM | POA: Diagnosis not present

## 2022-10-07 DIAGNOSIS — Z23 Encounter for immunization: Secondary | ICD-10-CM | POA: Diagnosis not present

## 2022-10-07 DIAGNOSIS — I63232 Cerebral infarction due to unspecified occlusion or stenosis of left carotid arteries: Secondary | ICD-10-CM | POA: Diagnosis not present

## 2022-10-08 DIAGNOSIS — F419 Anxiety disorder, unspecified: Secondary | ICD-10-CM | POA: Diagnosis not present

## 2022-10-08 DIAGNOSIS — I63232 Cerebral infarction due to unspecified occlusion or stenosis of left carotid arteries: Secondary | ICD-10-CM | POA: Diagnosis not present

## 2022-10-08 DIAGNOSIS — R41841 Cognitive communication deficit: Secondary | ICD-10-CM | POA: Diagnosis not present

## 2022-10-08 DIAGNOSIS — M6281 Muscle weakness (generalized): Secondary | ICD-10-CM | POA: Diagnosis not present

## 2022-10-08 DIAGNOSIS — F331 Major depressive disorder, recurrent, moderate: Secondary | ICD-10-CM | POA: Diagnosis not present

## 2022-10-08 DIAGNOSIS — Z23 Encounter for immunization: Secondary | ICD-10-CM | POA: Diagnosis not present

## 2022-10-10 DIAGNOSIS — M6281 Muscle weakness (generalized): Secondary | ICD-10-CM | POA: Diagnosis not present

## 2022-10-10 DIAGNOSIS — Z23 Encounter for immunization: Secondary | ICD-10-CM | POA: Diagnosis not present

## 2022-10-10 DIAGNOSIS — I63232 Cerebral infarction due to unspecified occlusion or stenosis of left carotid arteries: Secondary | ICD-10-CM | POA: Diagnosis not present

## 2022-10-12 DIAGNOSIS — Z23 Encounter for immunization: Secondary | ICD-10-CM | POA: Diagnosis not present

## 2022-10-12 DIAGNOSIS — I63232 Cerebral infarction due to unspecified occlusion or stenosis of left carotid arteries: Secondary | ICD-10-CM | POA: Diagnosis not present

## 2022-10-12 DIAGNOSIS — M6281 Muscle weakness (generalized): Secondary | ICD-10-CM | POA: Diagnosis not present

## 2022-10-13 DIAGNOSIS — Z23 Encounter for immunization: Secondary | ICD-10-CM | POA: Diagnosis not present

## 2022-10-13 DIAGNOSIS — M6281 Muscle weakness (generalized): Secondary | ICD-10-CM | POA: Diagnosis not present

## 2022-10-13 DIAGNOSIS — I63232 Cerebral infarction due to unspecified occlusion or stenosis of left carotid arteries: Secondary | ICD-10-CM | POA: Diagnosis not present

## 2022-10-14 DIAGNOSIS — I63232 Cerebral infarction due to unspecified occlusion or stenosis of left carotid arteries: Secondary | ICD-10-CM | POA: Diagnosis not present

## 2022-10-14 DIAGNOSIS — M6281 Muscle weakness (generalized): Secondary | ICD-10-CM | POA: Diagnosis not present

## 2022-10-14 DIAGNOSIS — Z23 Encounter for immunization: Secondary | ICD-10-CM | POA: Diagnosis not present

## 2022-10-15 DIAGNOSIS — Z23 Encounter for immunization: Secondary | ICD-10-CM | POA: Diagnosis not present

## 2022-10-15 DIAGNOSIS — M6281 Muscle weakness (generalized): Secondary | ICD-10-CM | POA: Diagnosis not present

## 2022-10-15 DIAGNOSIS — I63232 Cerebral infarction due to unspecified occlusion or stenosis of left carotid arteries: Secondary | ICD-10-CM | POA: Diagnosis not present

## 2022-10-16 DIAGNOSIS — E785 Hyperlipidemia, unspecified: Secondary | ICD-10-CM | POA: Diagnosis not present

## 2022-10-16 DIAGNOSIS — D649 Anemia, unspecified: Secondary | ICD-10-CM | POA: Diagnosis not present

## 2022-10-16 DIAGNOSIS — I10 Essential (primary) hypertension: Secondary | ICD-10-CM | POA: Diagnosis not present

## 2022-10-16 DIAGNOSIS — E118 Type 2 diabetes mellitus with unspecified complications: Secondary | ICD-10-CM | POA: Diagnosis not present

## 2022-10-16 DIAGNOSIS — Z23 Encounter for immunization: Secondary | ICD-10-CM | POA: Diagnosis not present

## 2022-10-16 DIAGNOSIS — I63232 Cerebral infarction due to unspecified occlusion or stenosis of left carotid arteries: Secondary | ICD-10-CM | POA: Diagnosis not present

## 2022-10-16 DIAGNOSIS — M6281 Muscle weakness (generalized): Secondary | ICD-10-CM | POA: Diagnosis not present

## 2022-10-18 DIAGNOSIS — Z23 Encounter for immunization: Secondary | ICD-10-CM | POA: Diagnosis not present

## 2022-10-18 DIAGNOSIS — I63232 Cerebral infarction due to unspecified occlusion or stenosis of left carotid arteries: Secondary | ICD-10-CM | POA: Diagnosis not present

## 2022-10-18 DIAGNOSIS — M6281 Muscle weakness (generalized): Secondary | ICD-10-CM | POA: Diagnosis not present

## 2022-10-20 DIAGNOSIS — M6281 Muscle weakness (generalized): Secondary | ICD-10-CM | POA: Diagnosis not present

## 2022-10-20 DIAGNOSIS — I63232 Cerebral infarction due to unspecified occlusion or stenosis of left carotid arteries: Secondary | ICD-10-CM | POA: Diagnosis not present

## 2022-10-20 DIAGNOSIS — Z23 Encounter for immunization: Secondary | ICD-10-CM | POA: Diagnosis not present

## 2022-10-21 DIAGNOSIS — M6281 Muscle weakness (generalized): Secondary | ICD-10-CM | POA: Diagnosis not present

## 2022-10-21 DIAGNOSIS — I63232 Cerebral infarction due to unspecified occlusion or stenosis of left carotid arteries: Secondary | ICD-10-CM | POA: Diagnosis not present

## 2022-10-21 DIAGNOSIS — Z23 Encounter for immunization: Secondary | ICD-10-CM | POA: Diagnosis not present

## 2022-10-22 DIAGNOSIS — M6281 Muscle weakness (generalized): Secondary | ICD-10-CM | POA: Diagnosis not present

## 2022-10-22 DIAGNOSIS — I63232 Cerebral infarction due to unspecified occlusion or stenosis of left carotid arteries: Secondary | ICD-10-CM | POA: Diagnosis not present

## 2022-10-22 DIAGNOSIS — Z23 Encounter for immunization: Secondary | ICD-10-CM | POA: Diagnosis not present

## 2022-10-24 DIAGNOSIS — M6281 Muscle weakness (generalized): Secondary | ICD-10-CM | POA: Diagnosis not present

## 2022-10-24 DIAGNOSIS — I63232 Cerebral infarction due to unspecified occlusion or stenosis of left carotid arteries: Secondary | ICD-10-CM | POA: Diagnosis not present

## 2022-10-24 DIAGNOSIS — Z23 Encounter for immunization: Secondary | ICD-10-CM | POA: Diagnosis not present

## 2022-11-05 DIAGNOSIS — E1140 Type 2 diabetes mellitus with diabetic neuropathy, unspecified: Secondary | ICD-10-CM | POA: Diagnosis not present

## 2022-11-05 DIAGNOSIS — I10 Essential (primary) hypertension: Secondary | ICD-10-CM | POA: Diagnosis not present

## 2022-11-05 DIAGNOSIS — I69391 Dysphagia following cerebral infarction: Secondary | ICD-10-CM | POA: Diagnosis not present

## 2022-11-05 DIAGNOSIS — F411 Generalized anxiety disorder: Secondary | ICD-10-CM | POA: Diagnosis not present

## 2022-11-06 DIAGNOSIS — I10 Essential (primary) hypertension: Secondary | ICD-10-CM | POA: Diagnosis not present

## 2022-11-09 DIAGNOSIS — Z794 Long term (current) use of insulin: Secondary | ICD-10-CM | POA: Diagnosis not present

## 2022-11-09 DIAGNOSIS — D649 Anemia, unspecified: Secondary | ICD-10-CM | POA: Diagnosis not present

## 2022-11-09 DIAGNOSIS — E1140 Type 2 diabetes mellitus with diabetic neuropathy, unspecified: Secondary | ICD-10-CM | POA: Diagnosis not present

## 2022-11-09 DIAGNOSIS — R7989 Other specified abnormal findings of blood chemistry: Secondary | ICD-10-CM | POA: Diagnosis not present

## 2022-11-12 DIAGNOSIS — R41841 Cognitive communication deficit: Secondary | ICD-10-CM | POA: Diagnosis not present

## 2022-11-12 DIAGNOSIS — F331 Major depressive disorder, recurrent, moderate: Secondary | ICD-10-CM | POA: Diagnosis not present

## 2022-11-12 DIAGNOSIS — F419 Anxiety disorder, unspecified: Secondary | ICD-10-CM | POA: Diagnosis not present

## 2022-11-13 DIAGNOSIS — M6281 Muscle weakness (generalized): Secondary | ICD-10-CM | POA: Diagnosis not present

## 2022-11-13 DIAGNOSIS — Z23 Encounter for immunization: Secondary | ICD-10-CM | POA: Diagnosis not present

## 2022-11-13 DIAGNOSIS — F411 Generalized anxiety disorder: Secondary | ICD-10-CM | POA: Diagnosis not present

## 2022-11-13 DIAGNOSIS — F410 Panic disorder [episodic paroxysmal anxiety] without agoraphobia: Secondary | ICD-10-CM | POA: Diagnosis not present

## 2022-11-13 DIAGNOSIS — I63232 Cerebral infarction due to unspecified occlusion or stenosis of left carotid arteries: Secondary | ICD-10-CM | POA: Diagnosis not present

## 2022-11-15 DIAGNOSIS — I63232 Cerebral infarction due to unspecified occlusion or stenosis of left carotid arteries: Secondary | ICD-10-CM | POA: Diagnosis not present

## 2022-11-15 DIAGNOSIS — M6281 Muscle weakness (generalized): Secondary | ICD-10-CM | POA: Diagnosis not present

## 2022-11-15 DIAGNOSIS — Z23 Encounter for immunization: Secondary | ICD-10-CM | POA: Diagnosis not present

## 2022-11-16 DIAGNOSIS — Z23 Encounter for immunization: Secondary | ICD-10-CM | POA: Diagnosis not present

## 2022-11-16 DIAGNOSIS — M6281 Muscle weakness (generalized): Secondary | ICD-10-CM | POA: Diagnosis not present

## 2022-11-16 DIAGNOSIS — I63232 Cerebral infarction due to unspecified occlusion or stenosis of left carotid arteries: Secondary | ICD-10-CM | POA: Diagnosis not present

## 2022-11-18 DIAGNOSIS — Z23 Encounter for immunization: Secondary | ICD-10-CM | POA: Diagnosis not present

## 2022-11-18 DIAGNOSIS — E1140 Type 2 diabetes mellitus with diabetic neuropathy, unspecified: Secondary | ICD-10-CM | POA: Diagnosis not present

## 2022-11-18 DIAGNOSIS — M6281 Muscle weakness (generalized): Secondary | ICD-10-CM | POA: Diagnosis not present

## 2022-11-18 DIAGNOSIS — I63232 Cerebral infarction due to unspecified occlusion or stenosis of left carotid arteries: Secondary | ICD-10-CM | POA: Diagnosis not present

## 2022-11-19 DIAGNOSIS — M6281 Muscle weakness (generalized): Secondary | ICD-10-CM | POA: Diagnosis not present

## 2022-11-19 DIAGNOSIS — E118 Type 2 diabetes mellitus with unspecified complications: Secondary | ICD-10-CM | POA: Diagnosis not present

## 2022-11-19 DIAGNOSIS — I63232 Cerebral infarction due to unspecified occlusion or stenosis of left carotid arteries: Secondary | ICD-10-CM | POA: Diagnosis not present

## 2022-11-19 DIAGNOSIS — Z23 Encounter for immunization: Secondary | ICD-10-CM | POA: Diagnosis not present

## 2022-11-20 DIAGNOSIS — E1140 Type 2 diabetes mellitus with diabetic neuropathy, unspecified: Secondary | ICD-10-CM | POA: Diagnosis not present

## 2022-11-21 DIAGNOSIS — I63232 Cerebral infarction due to unspecified occlusion or stenosis of left carotid arteries: Secondary | ICD-10-CM | POA: Diagnosis not present

## 2022-11-21 DIAGNOSIS — M6281 Muscle weakness (generalized): Secondary | ICD-10-CM | POA: Diagnosis not present

## 2022-11-22 DIAGNOSIS — I63232 Cerebral infarction due to unspecified occlusion or stenosis of left carotid arteries: Secondary | ICD-10-CM | POA: Diagnosis not present

## 2022-11-22 DIAGNOSIS — M6281 Muscle weakness (generalized): Secondary | ICD-10-CM | POA: Diagnosis not present

## 2022-11-23 DIAGNOSIS — I63232 Cerebral infarction due to unspecified occlusion or stenosis of left carotid arteries: Secondary | ICD-10-CM | POA: Diagnosis not present

## 2022-11-23 DIAGNOSIS — M6281 Muscle weakness (generalized): Secondary | ICD-10-CM | POA: Diagnosis not present

## 2022-11-24 DIAGNOSIS — M6281 Muscle weakness (generalized): Secondary | ICD-10-CM | POA: Diagnosis not present

## 2022-11-24 DIAGNOSIS — I63232 Cerebral infarction due to unspecified occlusion or stenosis of left carotid arteries: Secondary | ICD-10-CM | POA: Diagnosis not present

## 2022-11-25 DIAGNOSIS — I63232 Cerebral infarction due to unspecified occlusion or stenosis of left carotid arteries: Secondary | ICD-10-CM | POA: Diagnosis not present

## 2022-11-25 DIAGNOSIS — M6281 Muscle weakness (generalized): Secondary | ICD-10-CM | POA: Diagnosis not present

## 2022-11-26 DIAGNOSIS — I63232 Cerebral infarction due to unspecified occlusion or stenosis of left carotid arteries: Secondary | ICD-10-CM | POA: Diagnosis not present

## 2022-11-26 DIAGNOSIS — M6281 Muscle weakness (generalized): Secondary | ICD-10-CM | POA: Diagnosis not present

## 2022-11-30 ENCOUNTER — Telehealth: Payer: Self-pay | Admitting: Family Medicine

## 2022-11-30 DIAGNOSIS — I63232 Cerebral infarction due to unspecified occlusion or stenosis of left carotid arteries: Secondary | ICD-10-CM | POA: Diagnosis not present

## 2022-11-30 DIAGNOSIS — M6281 Muscle weakness (generalized): Secondary | ICD-10-CM | POA: Diagnosis not present

## 2022-11-30 NOTE — Telephone Encounter (Signed)
Copied from [place] [id number]. Topic: Medicare AWV >> Nov 30, 2022 11:50 AM Devoria Glassing wrote: Reason for CRM: Called patient to schedule Medicare Annual Wellness Visit (AWV). No voicemail available to leave a message.  Last date of AWV: 09/25/21  Please schedule an appointment at any time with Amber, [place].  If any questions, please contact me.  Thank you ,  Sherol Dade; [place] Direct Dial: [phone]

## 2022-12-01 DIAGNOSIS — M6281 Muscle weakness (generalized): Secondary | ICD-10-CM | POA: Diagnosis not present

## 2022-12-01 DIAGNOSIS — I63232 Cerebral infarction due to unspecified occlusion or stenosis of left carotid arteries: Secondary | ICD-10-CM | POA: Diagnosis not present

## 2022-12-02 ENCOUNTER — Emergency Department (HOSPITAL_COMMUNITY): Payer: Medicare HMO

## 2022-12-02 ENCOUNTER — Encounter (HOSPITAL_COMMUNITY): Payer: Self-pay | Admitting: Emergency Medicine

## 2022-12-02 ENCOUNTER — Emergency Department (HOSPITAL_COMMUNITY)
Admission: EM | Admit: 2022-12-02 | Discharge: 2022-12-02 | Disposition: A | Discharge status: Skilled Nursing Facility | Payer: Medicare HMO | Attending: Emergency Medicine | Admitting: Emergency Medicine

## 2022-12-02 ENCOUNTER — Other Ambulatory Visit: Payer: Self-pay

## 2022-12-02 DIAGNOSIS — R55 Syncope and collapse: Secondary | ICD-10-CM | POA: Diagnosis not present

## 2022-12-02 DIAGNOSIS — I63232 Cerebral infarction due to unspecified occlusion or stenosis of left carotid arteries: Secondary | ICD-10-CM | POA: Diagnosis not present

## 2022-12-02 DIAGNOSIS — E119 Type 2 diabetes mellitus without complications: Secondary | ICD-10-CM | POA: Insufficient documentation

## 2022-12-02 DIAGNOSIS — R29898 Other symptoms and signs involving the musculoskeletal system: Secondary | ICD-10-CM | POA: Diagnosis not present

## 2022-12-02 DIAGNOSIS — J449 Chronic obstructive pulmonary disease, unspecified: Secondary | ICD-10-CM | POA: Insufficient documentation

## 2022-12-02 DIAGNOSIS — I69320 Aphasia following cerebral infarction: Secondary | ICD-10-CM | POA: Insufficient documentation

## 2022-12-02 DIAGNOSIS — R2990 Unspecified symptoms and signs involving the nervous system: Secondary | ICD-10-CM | POA: Diagnosis not present

## 2022-12-02 DIAGNOSIS — R404 Transient alteration of awareness: Secondary | ICD-10-CM | POA: Diagnosis not present

## 2022-12-02 DIAGNOSIS — Z7982 Long term (current) use of aspirin: Secondary | ICD-10-CM | POA: Insufficient documentation

## 2022-12-02 DIAGNOSIS — N3000 Acute cystitis without hematuria: Secondary | ICD-10-CM | POA: Diagnosis not present

## 2022-12-02 DIAGNOSIS — I10 Essential (primary) hypertension: Secondary | ICD-10-CM | POA: Diagnosis not present

## 2022-12-02 DIAGNOSIS — Z7401 Bed confinement status: Secondary | ICD-10-CM | POA: Diagnosis not present

## 2022-12-02 DIAGNOSIS — M6281 Muscle weakness (generalized): Secondary | ICD-10-CM | POA: Diagnosis not present

## 2022-12-02 DIAGNOSIS — R4182 Altered mental status, unspecified: Secondary | ICD-10-CM | POA: Diagnosis not present

## 2022-12-02 DIAGNOSIS — R531 Weakness: Secondary | ICD-10-CM | POA: Diagnosis not present

## 2022-12-02 LAB — COMPREHENSIVE METABOLIC PANEL
ALT: 34 U/L (ref 0–44)
AST: 32 U/L (ref 15–41)
Albumin: 3.5 g/dL (ref 3.5–5.0)
Alkaline Phosphatase: 102 U/L (ref 38–126)
Anion gap: 10 (ref 5–15)
BUN: 9 mg/dL (ref 8–23)
CO2: 22 mmol/L (ref 22–32)
Calcium: 9.1 mg/dL (ref 8.9–10.3)
Chloride: 106 mmol/L (ref 98–111)
Creatinine, Ser: 0.63 mg/dL (ref 0.44–1.00)
GFR, Estimated: >60 mL/min (ref >60)
Glucose, Bld: 123 mg/dL — ABNORMAL HIGH (ref 70–99)
Potassium: 3.6 mmol/L (ref 3.5–5.1)
Sodium: 138 mmol/L (ref 135–145)
Total Bilirubin: <0.1 mg/dL — ABNORMAL LOW (ref 0.3–1.2)
Total Protein: 7.4 g/dL (ref 6.5–8.1)

## 2022-12-02 LAB — URINALYSIS, ROUTINE W REFLEX MICROSCOPIC
Bilirubin Urine: NEGATIVE
Glucose, UA: NEGATIVE mg/dL
Hgb urine dipstick: NEGATIVE
Ketones, ur: NEGATIVE mg/dL
Nitrite: POSITIVE — AB
Protein, ur: 30 mg/dL — AB
Specific Gravity, Urine: 1.014 (ref 1.005–1.030)
WBC, UA: >50 WBC/hpf (ref 0–5)
pH: 5 (ref 5.0–8.0)

## 2022-12-02 LAB — CBC WITH DIFFERENTIAL/PLATELET
Abs Immature Granulocytes: 0.07 10*3/uL (ref 0.00–0.07)
Basophils Absolute: 0.1 10*3/uL (ref 0.0–0.1)
Basophils Relative: 1 %
Eosinophils Absolute: 0.2 10*3/uL (ref 0.0–0.5)
Eosinophils Relative: 2 %
HCT: 35.1 % — ABNORMAL LOW (ref 36.0–46.0)
Hemoglobin: 10.7 g/dL — ABNORMAL LOW (ref 12.0–15.0)
Immature Granulocytes: 1 %
Lymphocytes Relative: 17 %
Lymphs Abs: 2.1 10*3/uL (ref 0.7–4.0)
MCH: 20.9 pg — ABNORMAL LOW (ref 26.0–34.0)
MCHC: 30.5 g/dL (ref 30.0–36.0)
MCV: 68.7 fL — ABNORMAL LOW (ref 80.0–100.0)
Monocytes Absolute: 0.8 10*3/uL (ref 0.1–1.0)
Monocytes Relative: 6 %
Neutro Abs: 9.3 10*3/uL — ABNORMAL HIGH (ref 1.7–7.7)
Neutrophils Relative %: 73 %
Platelets: 358 10*3/uL (ref 150–400)
RBC: 5.11 MIL/uL (ref 3.87–5.11)
RDW: 19.7 % — ABNORMAL HIGH (ref 11.5–15.5)
WBC: 12.6 10*3/uL — ABNORMAL HIGH (ref 4.0–10.5)
nRBC: 0 % (ref 0.0–0.2)

## 2022-12-02 LAB — TROPONIN I (HIGH SENSITIVITY)
Troponin I (High Sensitivity): 5 ng/L (ref <18)
Troponin I (High Sensitivity): 5 ng/L (ref <18)

## 2022-12-02 LAB — CBG MONITORING, ED: Glucose-Capillary: 152 mg/dL — ABNORMAL HIGH (ref 70–99)

## 2022-12-02 LAB — MAGNESIUM: Magnesium: 1.8 mg/dL (ref 1.7–2.4)

## 2022-12-02 MED ORDER — CEFDINIR 300 MG PO CAPS: 300.0000 mg | ORAL_CAPSULE | Freq: Two times a day (BID) | ORAL | 0 refills | Status: AC

## 2022-12-02 MED ORDER — SODIUM CHLORIDE 0.9 % IV SOLN
1.0000 g | Freq: Once | INTRAVENOUS | Status: AC
  Filled 2022-12-02: qty 10

## 2022-12-02 NOTE — ED Provider Notes (Signed)
[facility] Provider Note   CSN: [id number] Arrival date & time: 12/02/22  1251     History  Chief Complaint  Patient presents with   Loss of Consciousness    Tinaya Sessums is a 67 y.o. 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  Patient is aphasic at baseline d/t h/o CVA w/ R sided weakness.  Patient's roommate her facility reportedly saw her unresponsive on the toilet. She did not fall or hit her head.  Patient is aphasic at baseline due to stroke.  On my evaluation she is alert and she follows some commands in the room but mutters the same unintelligible phrase with each question asked. Patient's son is not at bedside during my initial evaluation so history is limited.    Loss of Consciousness      Home Medications Prior to Admission medications   Medication Sig Start Date End Date Taking? Authorizing Provider  acetaminophen (TYLENOL) 500 MG tablet Take 1,000 mg by mouth every 6 (six) hours as needed (pain). Reported on 04/08/2016    [provider]  albuterol (VENTOLIN HFA) 108 (90 Base) MCG/ACT inhaler Inhale 2 puffs into the lungs every 6 (six) hours as needed for wheezing or shortness of breath. 08/21/21   Copland, Gay Filler, MD  alendronate (FOSAMAX) 70 MG tablet Take 1 tablet (70 mg total) by mouth every 7 (seven) days. Take with a full glass of water on an empty stomach. 01/05/22   Copland, Gay Filler, MD  aspirin 81 MG chewable tablet Chew 1 tablet (81 mg total) by mouth daily. 05/29/22   Rosalin Hawking, MD  atorvastatin (LIPITOR) 40 MG tablet TAKE 1 TABLET EVERY DAY 12/12/21   Copland, Gay Filler, MD  Boric Acid Vaginal (AZO BORIC ACID) 600 MG SUPP Place 1 suppository vaginally at bedtime. 01/02/22   Shelly Bombard, MD  butalbital-acetaminophen-caffeine (FIORICET) [phone] MG tablet Take 1-2  tablets by mouth every 6 (six) hours as needed for headache. Max 6 per day 04/16/22   Copland, Gay Filler, MD  cholestyramine (QUESTRAN) 4 g packet DISSOLVE & TAKE 1 POWDER PACKET BY MOUTH TWICE DAILY 02/19/21   Copland, Gay Filler, MD  cyclobenzaprine (FLEXERIL) 5 MG tablet Take 1 tablet (5 mg total) by mouth at bedtime. Use as needed for neck pain 07/02/21   Copland, Gay Filler, MD  insulin aspart (NOVOLOG) 100 UNIT/ML injection Inject 4 Units into the skin 3 (three) times daily with meals. 05/28/22   Rosalin Hawking, MD  insulin glargine-yfgn (SEMGLEE) 100 UNIT/ML injection Inject 0.24 mLs (24 Units total) into the skin daily. 05/29/22   Rosalin Hawking, MD  lisinopril (ZESTRIL) 40 MG tablet Take 1 tablet (40 mg total) by mouth daily. 04/16/22   Copland, Gay Filler, MD  Multiple Vitamin (MULTIVITAMIN WITH MINERALS) TABS tablet Take 1 tablet by mouth daily. 05/29/22   Rosalin Hawking, MD  pantoprazole (PROTONIX) 40 MG tablet Take 1 tablet (40 mg total) by mouth 2 (two) times daily. 02/20/22   Copland, Gay Filler, MD  ticagrelor (BRILINTA) 90 MG TABS tablet Take 1 tablet (90 mg total) by mouth 2 (two) times daily. 05/28/22   Rosalin Hawking, MD  triamcinolone cream (KENALOG) 0.1 % Apply 1 application topically 2 (two) times daily. Use as needed on dry or scaly areas 03/30/19   Copland, Gay Filler, MD  Allergies    Patient has no known allergies.    Review of Systems   Review of Systems  Unable to perform ROS: Patient nonverbal  Cardiovascular:  Positive for syncope.    Physical Exam Updated Vital Signs BP (!) 179/70   Pulse 91   Temp 98.1 F (36.7 C) (Oral)   Resp 14   Wt 79.4 kg   SpO2 100%   BMI 29.12 kg/m  Physical Exam General: Normal appearing female, lying in bed.  HEENT: PERRLA, Sclera anicteric, MMM, trachea midline. NCAT. No midline C-spine stepoffs/tenderness/deformities. Stable forehead ,midface, nasal bridge. No mandibular swelling/deformities. Cardiology: RRR, no murmurs/rubs/gallops. BL radial and DP  pulses equal bilaterally.  Resp: Normal respiratory rate and effort. CTAB, no wheezes, rhonchi, crackles.  Abd: Soft, non-tender, non-distended. No rebound tenderness or guarding.  GU: Deferred. MSK: No peripheral edema or signs of trauma. Extremities without deformity or TTP. No cyanosis or clubbing. Skin: warm, dry. No rashes or lesions. Back: No CVA tenderness Neuro: Alert, responsive with the same unintelligible phrase with each question asked. CNs II-XII grossly intact. MAEs with 1/5 strength in RUE/RLE, 4/5 strength n LUE/LLE. Sensation grossly intact.  Psych: [place]  ED Results / Procedures / Treatments   Labs (all labs ordered are listed, but only abnormal results are displayed) Labs Reviewed  CBG MONITORING, ED - Abnormal; Notable for the following components:      Result Value   Glucose-Capillary 152 (*)    All other components within normal limits  CBC WITH DIFFERENTIAL/PLATELET  COMPREHENSIVE METABOLIC PANEL  MAGNESIUM  URINALYSIS, ROUTINE W REFLEX MICROSCOPIC  TROPONIN I (HIGH SENSITIVITY)  TROPONIN I (HIGH SENSITIVITY)    EKG EKG Interpretation  Date/Time:  Wednesday December 02 2022 13:03:20 EDT Ventricular Rate:  89 PR Interval:  171 QRS Duration: 89 QT Interval:  385 QTC Calculation: 469 R Axis:   23 Text Interpretation: Sinus rhythm Confirmed by Cindee Lame [phone]) on 12/02/2022 1:27:18 PM  Radiology No results found.  Procedures Procedures    Medications Ordered in ED Medications - No data to display  ED Course/ Medical Decision Making/ A&P                          Medical Decision Making Amount and/or Complexity of Data Reviewed Labs: ordered. Decision-making details documented in ED Course. Radiology: ordered.    This patient presents to the ED for concern of LOC; this involves an extensive number of treatment options, and is a complaint that carries with it a high risk of complications and morbidity.  I considered the following differential  and admission for this acute, potentially life threatening condition.   MDM:    DDX for syncope or an episode of unresponsiveness includes but is not limited to:  Consider anemia, infection such as UTI, electrolyte abnormalities, hypo-/hyperglycemia as possible etiologies. Consider arrhythmias and ACS. Consider hemorrhage vs CVA; she has no observed new FNDs from what is previously documented, but patient cannot tell me if she has new symptoms, will obtain CTH. Consider PE as well, although without hypoxia or sob.  She did not have any visualized or reported seizure activity, loss of bladder or bowel to indicate seizure.  Patient is aphasic and so cannot communicate pain, will obtain a troponin and EKG to evaluate for ACS or arrhythmia.   Clinical Course as of 12/02/22 1603  Wed Dec 02, 2022  1327 Glucose-Capillary(!): 152 [HN]  1555 Called patient's son Dwana Melena, who is on his  way back to the ED. He states that according to the [place], she wasn't responding to verbal commands, her blood pressure was elevated, and she was feeling out of sorts. He states that when he was here earlier she seemed to be acting at her baseline. Normally does repeat that same phrase. [HN]    Clinical Course User Index [HN] Audley Hose, MD    Labs: I Ordered, and personally interpreted labs.  The pertinent results include:  those listed above  Imaging Studies ordered: I ordered imaging studies including CTH I independently visualized and interpreted imaging. I agree with the radiologist interpretation  Additional history obtained from chart review, family over phone.    Cardiac Monitoring: The patient was maintained on a cardiac monitor.  I personally viewed and interpreted the cardiac monitored which showed an underlying rhythm of: NSR  Social Determinants of Health: Lives in SNF  Disposition:  Patient is signed out to the oncoming ED physician Dr. Truett Mainland who is made aware of her  history, presentation, exam, workup, and plan. Plan is to obtain [facility] and lab w/u.     Co morbidities that complicate the patient evaluation  Past Medical History:  Diagnosis Date   Arthritis    Cataract    COPD (chronic obstructive pulmonary disease) ([place])    Diabetes mellitus without complication ([place])    Fibrocystic breast disease July 2016   GERD (gastroesophageal reflux disease)    Hyperlipidemia    Hypertension      Medicines No orders of the defined types were placed in this encounter.   I have reviewed the patients home medicines and have made adjustments as needed  Problem List / ED Course: Problem List Items Addressed This Visit   None Visit Diagnoses     Syncope, unspecified syncope type    -  Primary                   This note was created using dictation software, which may contain spelling or grammatical errors.    Audley Hose, MD 12/02/22 [phone]

## 2022-12-02 NOTE — Discharge Instructions (Addendum)
We evaluated Bailey [name] in the emergency department for her episode of decreased awareness.  Her testing in the emergency department was overall very reassuring including a CT scan of her head.  She seems back to normal now.  Her laboratory test did show signs of a urinary infection.  This could be contributing to her symptoms today.  We gave her a dose of antibiotics in the emergency department and have prescribed an antibiotic for her to take over the next 7 days.  Please have Bailey [name] follow-up with her primary doctor in the next 2 to 3 days.  If she has any recurrent episodes, she should come back to the emergency department for repeat evaluation.  Please also bring her back if she develops any vomiting, fevers, loss of consciousness, falls, or any other concerning symptoms.

## 2022-12-02 NOTE — ED Provider Notes (Signed)
Physical Exam  BP (!) 179/70   Pulse 91   Temp 98.1 F (36.7 C) (Oral)   Resp 14   Wt 79.4 kg   SpO2 100%   BMI 29.12 kg/m   Physical Exam Vitals and nursing note reviewed.  Constitutional:      General: She is not in acute distress.    Appearance: She is well-developed.  HENT:     Head: Normocephalic and atraumatic.     Mouth/Throat:     Mouth: Mucous membranes are moist.  Eyes:     Pupils: Pupils are equal, round, and reactive to light.  Cardiovascular:     Rate and Rhythm: Normal rate and regular rhythm.     Heart sounds: No murmur heard. Pulmonary:     Effort: Pulmonary effort is normal. No respiratory distress.     Breath sounds: Normal breath sounds.  Abdominal:     General: Abdomen is flat.     Palpations: Abdomen is soft.     Tenderness: There is no abdominal tenderness.  Musculoskeletal:        General: No tenderness.     Right lower leg: No edema.     Left lower leg: No edema.  Skin:    General: Skin is warm and dry.  Neurological:     Mental Status: She is alert. Mental status is at baseline.     Comments: Very aphasic. Chronic right sided weakness  Psychiatric:        Mood and Affect: Mood normal.        Behavior: Behavior normal.     Procedures  Procedures  ED Course / MDM   Clinical Course as of 12/02/22 1834  Wed Dec 02, 2022  1327 Glucose-Capillary(!): 152 [HN]  1555 Called patient's son Darerro, who is on his way back to the ED. He states that according to the [place], she wasn't responding to verbal commands, her blood pressure was elevated, and she was feeling out of sorts. He states that when he was here earlier she seemed to be acting at her baseline. Normally does repeat that same phrase. [HN]  1620 Received sign out from Dr. Mayra Neer, had syncope at nursing facility, while on toilet, pending labs. Per son at baseline now.  [WS]  [id number] Labs notable for UTI. Possible this caused her generalized weakness.  [WS]  [id number]  Discussed again with patient's son.  Went over results including signs of urinary infection.  She does have a small leukocytosis.  She has had previous UTIs.  Reviewed previous culture data.  Will treat with ceftriaxone.  Per son, patient is back to baseline.  On review, it seems that patient never really had any loss of consciousness just seem to have an episode of decreased responsiveness. History is very limited due to aphasia. CT head negative for acute process.  Troponin is negative.  She had a normal echocardiogram last year.  Her physical exam is overall reassuring.  Given overall reassuring workup and possible cause, will discharge, treat for urinary infection and I recommend very close follow-up with patient's primary physician.  Discussed with son. Will discharge. Return precautions discussed with son and listed on the discharge instructions. Advised return precautions for any recurrent episodes. [WS]    Clinical Course User Index [HN] Audley Hose, MD [WS] Cristie Hem, MD   Medical Decision Making Amount and/or Complexity of Data Reviewed Labs: ordered. Decision-making details documented in ED Course. Radiology: ordered.  Risk Prescription drug management.  Cristie Hem, MD 12/02/22 [phone]

## 2022-12-03 DIAGNOSIS — M6281 Muscle weakness (generalized): Secondary | ICD-10-CM | POA: Diagnosis not present

## 2022-12-03 DIAGNOSIS — N390 Urinary tract infection, site not specified: Secondary | ICD-10-CM | POA: Diagnosis not present

## 2022-12-03 DIAGNOSIS — I63232 Cerebral infarction due to unspecified occlusion or stenosis of left carotid arteries: Secondary | ICD-10-CM | POA: Diagnosis not present

## 2022-12-04 DIAGNOSIS — M6281 Muscle weakness (generalized): Secondary | ICD-10-CM | POA: Diagnosis not present

## 2022-12-04 DIAGNOSIS — N390 Urinary tract infection, site not specified: Secondary | ICD-10-CM | POA: Diagnosis not present

## 2022-12-04 DIAGNOSIS — I63232 Cerebral infarction due to unspecified occlusion or stenosis of left carotid arteries: Secondary | ICD-10-CM | POA: Diagnosis not present

## 2022-12-05 LAB — URINE CULTURE: Culture: >=100000 — AB

## 2022-12-06 ENCOUNTER — Telehealth (HOSPITAL_BASED_OUTPATIENT_CLINIC_OR_DEPARTMENT_OTHER): Payer: Self-pay | Admitting: *Deleted

## 2022-12-06 DIAGNOSIS — M6281 Muscle weakness (generalized): Secondary | ICD-10-CM | POA: Diagnosis not present

## 2022-12-06 DIAGNOSIS — I63232 Cerebral infarction due to unspecified occlusion or stenosis of left carotid arteries: Secondary | ICD-10-CM | POA: Diagnosis not present

## 2022-12-06 NOTE — Telephone Encounter (Signed)
Post ED Visit - Positive Culture Follow-up  Culture report reviewed by antimicrobial stewardship pharmacist: [facility] Team []  Elenor Quinones, Pharm.D. []  Heide Guile, Pharm.D., BCPS AQ-ID []  Parks Neptune, Pharm.D., BCPS []  Alycia Rossetti, Pharm.D., BCPS []  [place], Pharm.D., BCPS, AAHIVP []  Legrand Como, Pharm.D., BCPS, AAHIVP []  Salome Arnt, PharmD, BCPS []  Johnnette Gourd, PharmD, BCPS []  Hughes Better, PharmD, BCPS []  Leeroy Cha, PharmD []  Laqueta Linden, PharmD, BCPS [x]  Isac Caddy, PharmD  [place] Team []  Leodis Sias, PharmD []  Lindell Spar, PharmD []  Royetta Asal, PharmD []  Graylin Shiver, Rph []  Rema Fendt) Glennon Mac, PharmD []  Arlyn Dunning, PharmD []  Netta Cedars, PharmD []  Dia Sitter, PharmD []  Leone Haven, PharmD []  Gretta Arab, PharmD []  Theodis Shove, PharmD []  Peggyann Juba, PharmD []  Reuel Boom, PharmD   Positive urine culture Treated with Cefdinir, organism sensitive to the same and no further patient follow-up is required at this time.  Rosie Fate 12/06/2022, 11:30 AM

## 2022-12-07 DIAGNOSIS — M6281 Muscle weakness (generalized): Secondary | ICD-10-CM | POA: Diagnosis not present

## 2022-12-07 DIAGNOSIS — I63232 Cerebral infarction due to unspecified occlusion or stenosis of left carotid arteries: Secondary | ICD-10-CM | POA: Diagnosis not present

## 2022-12-08 DIAGNOSIS — M6281 Muscle weakness (generalized): Secondary | ICD-10-CM | POA: Diagnosis not present

## 2022-12-08 DIAGNOSIS — I63232 Cerebral infarction due to unspecified occlusion or stenosis of left carotid arteries: Secondary | ICD-10-CM | POA: Diagnosis not present

## 2022-12-09 DIAGNOSIS — I63232 Cerebral infarction due to unspecified occlusion or stenosis of left carotid arteries: Secondary | ICD-10-CM | POA: Diagnosis not present

## 2022-12-09 DIAGNOSIS — M6281 Muscle weakness (generalized): Secondary | ICD-10-CM | POA: Diagnosis not present

## 2022-12-10 DIAGNOSIS — G4700 Insomnia, unspecified: Secondary | ICD-10-CM | POA: Diagnosis not present

## 2022-12-10 DIAGNOSIS — I69391 Dysphagia following cerebral infarction: Secondary | ICD-10-CM | POA: Diagnosis not present

## 2022-12-10 DIAGNOSIS — R41841 Cognitive communication deficit: Secondary | ICD-10-CM | POA: Diagnosis not present

## 2022-12-10 DIAGNOSIS — F331 Major depressive disorder, recurrent, moderate: Secondary | ICD-10-CM | POA: Diagnosis not present

## 2022-12-10 DIAGNOSIS — F419 Anxiety disorder, unspecified: Secondary | ICD-10-CM | POA: Diagnosis not present

## 2022-12-10 DIAGNOSIS — R1110 Vomiting, unspecified: Secondary | ICD-10-CM | POA: Diagnosis not present

## 2022-12-25 DIAGNOSIS — I63232 Cerebral infarction due to unspecified occlusion or stenosis of left carotid arteries: Secondary | ICD-10-CM | POA: Diagnosis not present

## 2022-12-25 DIAGNOSIS — I69822 Dysarthria following other cerebrovascular disease: Secondary | ICD-10-CM | POA: Diagnosis not present

## 2022-12-25 DIAGNOSIS — I69320 Aphasia following cerebral infarction: Secondary | ICD-10-CM | POA: Diagnosis not present

## 2022-12-26 DIAGNOSIS — I63232 Cerebral infarction due to unspecified occlusion or stenosis of left carotid arteries: Secondary | ICD-10-CM | POA: Diagnosis not present

## 2022-12-26 DIAGNOSIS — I69822 Dysarthria following other cerebrovascular disease: Secondary | ICD-10-CM | POA: Diagnosis not present

## 2022-12-26 DIAGNOSIS — I69320 Aphasia following cerebral infarction: Secondary | ICD-10-CM | POA: Diagnosis not present

## 2022-12-28 DIAGNOSIS — I69822 Dysarthria following other cerebrovascular disease: Secondary | ICD-10-CM | POA: Diagnosis not present

## 2022-12-28 DIAGNOSIS — Z79899 Other long term (current) drug therapy: Secondary | ICD-10-CM | POA: Diagnosis not present

## 2022-12-28 DIAGNOSIS — I69320 Aphasia following cerebral infarction: Secondary | ICD-10-CM | POA: Diagnosis not present

## 2022-12-28 DIAGNOSIS — I6930 Unspecified sequelae of cerebral infarction: Secondary | ICD-10-CM | POA: Diagnosis not present

## 2022-12-28 DIAGNOSIS — I63232 Cerebral infarction due to unspecified occlusion or stenosis of left carotid arteries: Secondary | ICD-10-CM | POA: Diagnosis not present

## 2022-12-29 DIAGNOSIS — I69320 Aphasia following cerebral infarction: Secondary | ICD-10-CM | POA: Diagnosis not present

## 2022-12-29 DIAGNOSIS — I63232 Cerebral infarction due to unspecified occlusion or stenosis of left carotid arteries: Secondary | ICD-10-CM | POA: Diagnosis not present

## 2022-12-29 DIAGNOSIS — I69822 Dysarthria following other cerebrovascular disease: Secondary | ICD-10-CM | POA: Diagnosis not present

## 2022-12-30 DIAGNOSIS — I69822 Dysarthria following other cerebrovascular disease: Secondary | ICD-10-CM | POA: Diagnosis not present

## 2022-12-30 DIAGNOSIS — I69320 Aphasia following cerebral infarction: Secondary | ICD-10-CM | POA: Diagnosis not present

## 2022-12-30 DIAGNOSIS — I63232 Cerebral infarction due to unspecified occlusion or stenosis of left carotid arteries: Secondary | ICD-10-CM | POA: Diagnosis not present

## 2022-12-31 DIAGNOSIS — I69822 Dysarthria following other cerebrovascular disease: Secondary | ICD-10-CM | POA: Diagnosis not present

## 2022-12-31 DIAGNOSIS — I69320 Aphasia following cerebral infarction: Secondary | ICD-10-CM | POA: Diagnosis not present

## 2022-12-31 DIAGNOSIS — I63232 Cerebral infarction due to unspecified occlusion or stenosis of left carotid arteries: Secondary | ICD-10-CM | POA: Diagnosis not present

## 2023-01-01 DIAGNOSIS — I69822 Dysarthria following other cerebrovascular disease: Secondary | ICD-10-CM | POA: Diagnosis not present

## 2023-01-01 DIAGNOSIS — I63232 Cerebral infarction due to unspecified occlusion or stenosis of left carotid arteries: Secondary | ICD-10-CM | POA: Diagnosis not present

## 2023-01-01 DIAGNOSIS — I69320 Aphasia following cerebral infarction: Secondary | ICD-10-CM | POA: Diagnosis not present

## 2023-01-04 DIAGNOSIS — I69320 Aphasia following cerebral infarction: Secondary | ICD-10-CM | POA: Diagnosis not present

## 2023-01-04 DIAGNOSIS — I63232 Cerebral infarction due to unspecified occlusion or stenosis of left carotid arteries: Secondary | ICD-10-CM | POA: Diagnosis not present

## 2023-01-04 DIAGNOSIS — I69822 Dysarthria following other cerebrovascular disease: Secondary | ICD-10-CM | POA: Diagnosis not present

## 2023-01-05 DIAGNOSIS — I69822 Dysarthria following other cerebrovascular disease: Secondary | ICD-10-CM | POA: Diagnosis not present

## 2023-01-05 DIAGNOSIS — I63232 Cerebral infarction due to unspecified occlusion or stenosis of left carotid arteries: Secondary | ICD-10-CM | POA: Diagnosis not present

## 2023-01-05 DIAGNOSIS — I69320 Aphasia following cerebral infarction: Secondary | ICD-10-CM | POA: Diagnosis not present

## 2023-01-06 DIAGNOSIS — I63232 Cerebral infarction due to unspecified occlusion or stenosis of left carotid arteries: Secondary | ICD-10-CM | POA: Diagnosis not present

## 2023-01-06 DIAGNOSIS — I69320 Aphasia following cerebral infarction: Secondary | ICD-10-CM | POA: Diagnosis not present

## 2023-01-06 DIAGNOSIS — I69822 Dysarthria following other cerebrovascular disease: Secondary | ICD-10-CM | POA: Diagnosis not present

## 2023-01-07 DIAGNOSIS — G4700 Insomnia, unspecified: Secondary | ICD-10-CM | POA: Diagnosis not present

## 2023-01-07 DIAGNOSIS — F331 Major depressive disorder, recurrent, moderate: Secondary | ICD-10-CM | POA: Diagnosis not present

## 2023-01-07 DIAGNOSIS — R41841 Cognitive communication deficit: Secondary | ICD-10-CM | POA: Diagnosis not present

## 2023-01-07 DIAGNOSIS — I69822 Dysarthria following other cerebrovascular disease: Secondary | ICD-10-CM | POA: Diagnosis not present

## 2023-01-07 DIAGNOSIS — I69320 Aphasia following cerebral infarction: Secondary | ICD-10-CM | POA: Diagnosis not present

## 2023-01-07 DIAGNOSIS — F419 Anxiety disorder, unspecified: Secondary | ICD-10-CM | POA: Diagnosis not present

## 2023-01-07 DIAGNOSIS — I63232 Cerebral infarction due to unspecified occlusion or stenosis of left carotid arteries: Secondary | ICD-10-CM | POA: Diagnosis not present

## 2023-01-08 DIAGNOSIS — I69822 Dysarthria following other cerebrovascular disease: Secondary | ICD-10-CM | POA: Diagnosis not present

## 2023-01-08 DIAGNOSIS — I63232 Cerebral infarction due to unspecified occlusion or stenosis of left carotid arteries: Secondary | ICD-10-CM | POA: Diagnosis not present

## 2023-01-08 DIAGNOSIS — I69320 Aphasia following cerebral infarction: Secondary | ICD-10-CM | POA: Diagnosis not present

## 2023-01-10 DIAGNOSIS — I63232 Cerebral infarction due to unspecified occlusion or stenosis of left carotid arteries: Secondary | ICD-10-CM | POA: Diagnosis not present

## 2023-01-10 DIAGNOSIS — I69320 Aphasia following cerebral infarction: Secondary | ICD-10-CM | POA: Diagnosis not present

## 2023-01-10 DIAGNOSIS — I69822 Dysarthria following other cerebrovascular disease: Secondary | ICD-10-CM | POA: Diagnosis not present

## 2023-01-11 DIAGNOSIS — I69822 Dysarthria following other cerebrovascular disease: Secondary | ICD-10-CM | POA: Diagnosis not present

## 2023-01-11 DIAGNOSIS — I69320 Aphasia following cerebral infarction: Secondary | ICD-10-CM | POA: Diagnosis not present

## 2023-01-11 DIAGNOSIS — I63232 Cerebral infarction due to unspecified occlusion or stenosis of left carotid arteries: Secondary | ICD-10-CM | POA: Diagnosis not present

## 2023-01-12 DIAGNOSIS — I69320 Aphasia following cerebral infarction: Secondary | ICD-10-CM | POA: Diagnosis not present

## 2023-01-12 DIAGNOSIS — I69822 Dysarthria following other cerebrovascular disease: Secondary | ICD-10-CM | POA: Diagnosis not present

## 2023-01-12 DIAGNOSIS — I63232 Cerebral infarction due to unspecified occlusion or stenosis of left carotid arteries: Secondary | ICD-10-CM | POA: Diagnosis not present

## 2023-01-13 DIAGNOSIS — I69822 Dysarthria following other cerebrovascular disease: Secondary | ICD-10-CM | POA: Diagnosis not present

## 2023-01-13 DIAGNOSIS — I69320 Aphasia following cerebral infarction: Secondary | ICD-10-CM | POA: Diagnosis not present

## 2023-01-13 DIAGNOSIS — I63232 Cerebral infarction due to unspecified occlusion or stenosis of left carotid arteries: Secondary | ICD-10-CM | POA: Diagnosis not present

## 2023-01-26 DIAGNOSIS — I6930 Unspecified sequelae of cerebral infarction: Secondary | ICD-10-CM | POA: Diagnosis not present

## 2023-01-26 DIAGNOSIS — I69391 Dysphagia following cerebral infarction: Secondary | ICD-10-CM | POA: Diagnosis not present

## 2023-01-26 DIAGNOSIS — E1140 Type 2 diabetes mellitus with diabetic neuropathy, unspecified: Secondary | ICD-10-CM | POA: Diagnosis not present

## 2023-01-26 DIAGNOSIS — F411 Generalized anxiety disorder: Secondary | ICD-10-CM | POA: Diagnosis not present

## 2023-01-28 DIAGNOSIS — E119 Type 2 diabetes mellitus without complications: Secondary | ICD-10-CM | POA: Diagnosis not present

## 2023-01-29 DIAGNOSIS — I69320 Aphasia following cerebral infarction: Secondary | ICD-10-CM | POA: Diagnosis not present

## 2023-01-29 DIAGNOSIS — N181 Chronic kidney disease, stage 1: Secondary | ICD-10-CM | POA: Diagnosis not present

## 2023-01-29 DIAGNOSIS — E1122 Type 2 diabetes mellitus with diabetic chronic kidney disease: Secondary | ICD-10-CM | POA: Diagnosis not present

## 2023-01-29 DIAGNOSIS — D649 Anemia, unspecified: Secondary | ICD-10-CM | POA: Diagnosis not present

## 2023-02-04 DIAGNOSIS — F339 Major depressive disorder, recurrent, unspecified: Secondary | ICD-10-CM | POA: Diagnosis not present

## 2023-02-04 DIAGNOSIS — F411 Generalized anxiety disorder: Secondary | ICD-10-CM | POA: Diagnosis not present

## 2023-02-04 DIAGNOSIS — G894 Chronic pain syndrome: Secondary | ICD-10-CM | POA: Diagnosis not present

## 2023-02-04 DIAGNOSIS — F5105 Insomnia due to other mental disorder: Secondary | ICD-10-CM | POA: Diagnosis not present

## 2023-02-12 DIAGNOSIS — D649 Anemia, unspecified: Secondary | ICD-10-CM | POA: Diagnosis not present

## 2023-02-12 DIAGNOSIS — E785 Hyperlipidemia, unspecified: Secondary | ICD-10-CM | POA: Diagnosis not present

## 2023-02-12 DIAGNOSIS — I10 Essential (primary) hypertension: Secondary | ICD-10-CM | POA: Diagnosis not present

## 2023-02-12 DIAGNOSIS — E118 Type 2 diabetes mellitus with unspecified complications: Secondary | ICD-10-CM | POA: Diagnosis not present

## 2023-02-24 DIAGNOSIS — I69320 Aphasia following cerebral infarction: Secondary | ICD-10-CM | POA: Diagnosis not present

## 2023-02-24 DIAGNOSIS — F411 Generalized anxiety disorder: Secondary | ICD-10-CM | POA: Diagnosis not present

## 2023-02-24 DIAGNOSIS — F4321 Adjustment disorder with depressed mood: Secondary | ICD-10-CM | POA: Diagnosis not present

## 2023-02-25 DIAGNOSIS — I69320 Aphasia following cerebral infarction: Secondary | ICD-10-CM | POA: Diagnosis not present

## 2023-02-25 DIAGNOSIS — K219 Gastro-esophageal reflux disease without esophagitis: Secondary | ICD-10-CM | POA: Diagnosis not present

## 2023-02-25 DIAGNOSIS — I69391 Dysphagia following cerebral infarction: Secondary | ICD-10-CM | POA: Diagnosis not present

## 2023-02-25 DIAGNOSIS — I6930 Unspecified sequelae of cerebral infarction: Secondary | ICD-10-CM | POA: Diagnosis not present

## 2023-02-25 DIAGNOSIS — I10 Essential (primary) hypertension: Secondary | ICD-10-CM | POA: Diagnosis not present

## 2023-02-25 DIAGNOSIS — F411 Generalized anxiety disorder: Secondary | ICD-10-CM | POA: Diagnosis not present

## 2023-02-25 DIAGNOSIS — D649 Anemia, unspecified: Secondary | ICD-10-CM | POA: Diagnosis not present

## 2023-02-25 DIAGNOSIS — E785 Hyperlipidemia, unspecified: Secondary | ICD-10-CM | POA: Diagnosis not present

## 2023-02-25 DIAGNOSIS — E1140 Type 2 diabetes mellitus with diabetic neuropathy, unspecified: Secondary | ICD-10-CM | POA: Diagnosis not present

## 2023-02-26 DIAGNOSIS — F411 Generalized anxiety disorder: Secondary | ICD-10-CM | POA: Diagnosis not present

## 2023-02-26 DIAGNOSIS — I69320 Aphasia following cerebral infarction: Secondary | ICD-10-CM | POA: Diagnosis not present

## 2023-02-26 DIAGNOSIS — F4321 Adjustment disorder with depressed mood: Secondary | ICD-10-CM | POA: Diagnosis not present

## 2023-03-01 DIAGNOSIS — B351 Tinea unguium: Secondary | ICD-10-CM | POA: Diagnosis not present

## 2023-03-01 DIAGNOSIS — M79675 Pain in left toe(s): Secondary | ICD-10-CM | POA: Diagnosis not present

## 2023-03-01 DIAGNOSIS — I63232 Cerebral infarction due to unspecified occlusion or stenosis of left carotid arteries: Secondary | ICD-10-CM | POA: Diagnosis not present

## 2023-03-01 DIAGNOSIS — L603 Nail dystrophy: Secondary | ICD-10-CM | POA: Diagnosis not present

## 2023-03-01 DIAGNOSIS — F4321 Adjustment disorder with depressed mood: Secondary | ICD-10-CM | POA: Diagnosis not present

## 2023-03-01 DIAGNOSIS — Z794 Long term (current) use of insulin: Secondary | ICD-10-CM | POA: Diagnosis not present

## 2023-03-01 DIAGNOSIS — M6281 Muscle weakness (generalized): Secondary | ICD-10-CM | POA: Diagnosis not present

## 2023-03-01 DIAGNOSIS — F32A Depression, unspecified: Secondary | ICD-10-CM | POA: Diagnosis not present

## 2023-03-01 DIAGNOSIS — M2141 Flat foot [pes planus] (acquired), right foot: Secondary | ICD-10-CM | POA: Diagnosis not present

## 2023-03-01 DIAGNOSIS — M79674 Pain in right toe(s): Secondary | ICD-10-CM | POA: Diagnosis not present

## 2023-03-01 DIAGNOSIS — I69320 Aphasia following cerebral infarction: Secondary | ICD-10-CM | POA: Diagnosis not present

## 2023-03-01 DIAGNOSIS — E1140 Type 2 diabetes mellitus with diabetic neuropathy, unspecified: Secondary | ICD-10-CM | POA: Diagnosis not present

## 2023-03-02 DIAGNOSIS — M6281 Muscle weakness (generalized): Secondary | ICD-10-CM | POA: Diagnosis not present

## 2023-03-02 DIAGNOSIS — I63232 Cerebral infarction due to unspecified occlusion or stenosis of left carotid arteries: Secondary | ICD-10-CM | POA: Diagnosis not present

## 2023-03-03 DIAGNOSIS — M6281 Muscle weakness (generalized): Secondary | ICD-10-CM | POA: Diagnosis not present

## 2023-03-03 DIAGNOSIS — I63232 Cerebral infarction due to unspecified occlusion or stenosis of left carotid arteries: Secondary | ICD-10-CM | POA: Diagnosis not present

## 2023-03-04 DIAGNOSIS — R531 Weakness: Secondary | ICD-10-CM | POA: Diagnosis not present

## 2023-03-04 DIAGNOSIS — Z8673 Personal history of transient ischemic attack (TIA), and cerebral infarction without residual deficits: Secondary | ICD-10-CM | POA: Diagnosis not present

## 2023-03-04 DIAGNOSIS — R195 Other fecal abnormalities: Secondary | ICD-10-CM | POA: Diagnosis not present

## 2023-03-04 DIAGNOSIS — F5105 Insomnia due to other mental disorder: Secondary | ICD-10-CM | POA: Diagnosis not present

## 2023-03-04 DIAGNOSIS — I63232 Cerebral infarction due to unspecified occlusion or stenosis of left carotid arteries: Secondary | ICD-10-CM | POA: Diagnosis not present

## 2023-03-04 DIAGNOSIS — I69320 Aphasia following cerebral infarction: Secondary | ICD-10-CM | POA: Diagnosis not present

## 2023-03-04 DIAGNOSIS — M6281 Muscle weakness (generalized): Secondary | ICD-10-CM | POA: Diagnosis not present

## 2023-03-04 DIAGNOSIS — F331 Major depressive disorder, recurrent, moderate: Secondary | ICD-10-CM | POA: Diagnosis not present

## 2023-03-04 DIAGNOSIS — G894 Chronic pain syndrome: Secondary | ICD-10-CM | POA: Diagnosis not present

## 2023-03-04 DIAGNOSIS — F411 Generalized anxiety disorder: Secondary | ICD-10-CM | POA: Diagnosis not present

## 2023-03-05 DIAGNOSIS — I63232 Cerebral infarction due to unspecified occlusion or stenosis of left carotid arteries: Secondary | ICD-10-CM | POA: Diagnosis not present

## 2023-03-05 DIAGNOSIS — M6281 Muscle weakness (generalized): Secondary | ICD-10-CM | POA: Diagnosis not present

## 2023-03-07 DIAGNOSIS — I63232 Cerebral infarction due to unspecified occlusion or stenosis of left carotid arteries: Secondary | ICD-10-CM | POA: Diagnosis not present

## 2023-03-07 DIAGNOSIS — M6281 Muscle weakness (generalized): Secondary | ICD-10-CM | POA: Diagnosis not present

## 2023-03-08 DIAGNOSIS — I63232 Cerebral infarction due to unspecified occlusion or stenosis of left carotid arteries: Secondary | ICD-10-CM | POA: Diagnosis not present

## 2023-03-08 DIAGNOSIS — M6281 Muscle weakness (generalized): Secondary | ICD-10-CM | POA: Diagnosis not present

## 2023-03-09 DIAGNOSIS — I63232 Cerebral infarction due to unspecified occlusion or stenosis of left carotid arteries: Secondary | ICD-10-CM | POA: Diagnosis not present

## 2023-03-09 DIAGNOSIS — M6281 Muscle weakness (generalized): Secondary | ICD-10-CM | POA: Diagnosis not present

## 2023-03-10 DIAGNOSIS — M6281 Muscle weakness (generalized): Secondary | ICD-10-CM | POA: Diagnosis not present

## 2023-03-10 DIAGNOSIS — I63232 Cerebral infarction due to unspecified occlusion or stenosis of left carotid arteries: Secondary | ICD-10-CM | POA: Diagnosis not present

## 2023-03-11 DIAGNOSIS — I63232 Cerebral infarction due to unspecified occlusion or stenosis of left carotid arteries: Secondary | ICD-10-CM | POA: Diagnosis not present

## 2023-03-11 DIAGNOSIS — M6281 Muscle weakness (generalized): Secondary | ICD-10-CM | POA: Diagnosis not present

## 2023-03-14 DIAGNOSIS — I63232 Cerebral infarction due to unspecified occlusion or stenosis of left carotid arteries: Secondary | ICD-10-CM | POA: Diagnosis not present

## 2023-03-14 DIAGNOSIS — M6281 Muscle weakness (generalized): Secondary | ICD-10-CM | POA: Diagnosis not present

## 2023-03-15 DIAGNOSIS — M6281 Muscle weakness (generalized): Secondary | ICD-10-CM | POA: Diagnosis not present

## 2023-03-15 DIAGNOSIS — I63232 Cerebral infarction due to unspecified occlusion or stenosis of left carotid arteries: Secondary | ICD-10-CM | POA: Diagnosis not present

## 2023-03-18 DIAGNOSIS — R531 Weakness: Secondary | ICD-10-CM | POA: Diagnosis not present

## 2023-03-18 DIAGNOSIS — I69951 Hemiplegia and hemiparesis following unspecified cerebrovascular disease affecting right dominant side: Secondary | ICD-10-CM | POA: Diagnosis not present

## 2023-03-25 DIAGNOSIS — H547 Unspecified visual loss: Secondary | ICD-10-CM | POA: Diagnosis not present

## 2023-03-25 DIAGNOSIS — I69320 Aphasia following cerebral infarction: Secondary | ICD-10-CM | POA: Diagnosis not present

## 2023-04-01 DIAGNOSIS — F331 Major depressive disorder, recurrent, moderate: Secondary | ICD-10-CM | POA: Diagnosis not present

## 2023-04-01 DIAGNOSIS — F5105 Insomnia due to other mental disorder: Secondary | ICD-10-CM | POA: Diagnosis not present

## 2023-04-01 DIAGNOSIS — G8929 Other chronic pain: Secondary | ICD-10-CM | POA: Diagnosis not present

## 2023-04-01 DIAGNOSIS — F411 Generalized anxiety disorder: Secondary | ICD-10-CM | POA: Diagnosis not present

## 2023-04-01 DIAGNOSIS — Z8673 Personal history of transient ischemic attack (TIA), and cerebral infarction without residual deficits: Secondary | ICD-10-CM | POA: Diagnosis not present

## 2023-04-02 DIAGNOSIS — G8929 Other chronic pain: Secondary | ICD-10-CM | POA: Diagnosis not present

## 2023-04-02 DIAGNOSIS — I69391 Dysphagia following cerebral infarction: Secondary | ICD-10-CM | POA: Diagnosis not present

## 2023-04-08 DIAGNOSIS — R1110 Vomiting, unspecified: Secondary | ICD-10-CM | POA: Diagnosis not present

## 2023-04-21 DIAGNOSIS — M898X1 Other specified disorders of bone, shoulder: Secondary | ICD-10-CM | POA: Diagnosis not present

## 2023-04-21 DIAGNOSIS — I69320 Aphasia following cerebral infarction: Secondary | ICD-10-CM | POA: Diagnosis not present

## 2023-04-22 DIAGNOSIS — R222 Localized swelling, mass and lump, trunk: Secondary | ICD-10-CM | POA: Diagnosis not present

## 2023-04-23 DIAGNOSIS — I69951 Hemiplegia and hemiparesis following unspecified cerebrovascular disease affecting right dominant side: Secondary | ICD-10-CM | POA: Diagnosis not present

## 2023-04-23 DIAGNOSIS — M24541 Contracture, right hand: Secondary | ICD-10-CM | POA: Diagnosis not present

## 2023-04-25 DIAGNOSIS — R1110 Vomiting, unspecified: Secondary | ICD-10-CM | POA: Diagnosis not present

## 2023-04-25 DIAGNOSIS — I69391 Dysphagia following cerebral infarction: Secondary | ICD-10-CM | POA: Diagnosis not present

## 2023-04-26 DIAGNOSIS — J069 Acute upper respiratory infection, unspecified: Secondary | ICD-10-CM | POA: Diagnosis not present

## 2023-04-26 DIAGNOSIS — R112 Nausea with vomiting, unspecified: Secondary | ICD-10-CM | POA: Diagnosis not present

## 2023-04-26 DIAGNOSIS — J3489 Other specified disorders of nose and nasal sinuses: Secondary | ICD-10-CM | POA: Diagnosis not present

## 2023-04-27 DIAGNOSIS — R229 Localized swelling, mass and lump, unspecified: Secondary | ICD-10-CM | POA: Diagnosis not present

## 2023-04-27 DIAGNOSIS — D649 Anemia, unspecified: Secondary | ICD-10-CM | POA: Diagnosis not present

## 2023-04-28 DIAGNOSIS — D649 Anemia, unspecified: Secondary | ICD-10-CM | POA: Diagnosis not present

## 2023-04-28 DIAGNOSIS — I69320 Aphasia following cerebral infarction: Secondary | ICD-10-CM | POA: Diagnosis not present

## 2023-04-28 DIAGNOSIS — N181 Chronic kidney disease, stage 1: Secondary | ICD-10-CM | POA: Diagnosis not present

## 2023-04-28 DIAGNOSIS — M898X1 Other specified disorders of bone, shoulder: Secondary | ICD-10-CM | POA: Diagnosis not present

## 2023-04-28 DIAGNOSIS — R739 Hyperglycemia, unspecified: Secondary | ICD-10-CM | POA: Diagnosis not present

## 2023-04-28 DIAGNOSIS — R222 Localized swelling, mass and lump, trunk: Secondary | ICD-10-CM | POA: Diagnosis not present

## 2023-04-29 DIAGNOSIS — F5105 Insomnia due to other mental disorder: Secondary | ICD-10-CM | POA: Diagnosis not present

## 2023-04-29 DIAGNOSIS — F411 Generalized anxiety disorder: Secondary | ICD-10-CM | POA: Diagnosis not present

## 2023-04-29 DIAGNOSIS — M898X1 Other specified disorders of bone, shoulder: Secondary | ICD-10-CM | POA: Diagnosis not present

## 2023-04-29 DIAGNOSIS — R222 Localized swelling, mass and lump, trunk: Secondary | ICD-10-CM | POA: Diagnosis not present

## 2023-04-29 DIAGNOSIS — Z8673 Personal history of transient ischemic attack (TIA), and cerebral infarction without residual deficits: Secondary | ICD-10-CM | POA: Diagnosis not present

## 2023-04-29 DIAGNOSIS — I69320 Aphasia following cerebral infarction: Secondary | ICD-10-CM | POA: Diagnosis not present

## 2023-04-29 DIAGNOSIS — F331 Major depressive disorder, recurrent, moderate: Secondary | ICD-10-CM | POA: Diagnosis not present

## 2023-04-29 DIAGNOSIS — R531 Weakness: Secondary | ICD-10-CM | POA: Diagnosis not present

## 2023-04-29 DIAGNOSIS — G8929 Other chronic pain: Secondary | ICD-10-CM | POA: Diagnosis not present

## 2023-05-01 DIAGNOSIS — B3789 Other sites of candidiasis: Secondary | ICD-10-CM | POA: Diagnosis not present

## 2023-05-01 DIAGNOSIS — I69320 Aphasia following cerebral infarction: Secondary | ICD-10-CM | POA: Diagnosis not present

## 2023-05-03 DIAGNOSIS — H35033 Hypertensive retinopathy, bilateral: Secondary | ICD-10-CM | POA: Diagnosis not present

## 2023-05-13 DIAGNOSIS — F411 Generalized anxiety disorder: Secondary | ICD-10-CM | POA: Diagnosis not present

## 2023-05-13 DIAGNOSIS — R531 Weakness: Secondary | ICD-10-CM | POA: Diagnosis not present

## 2023-05-13 DIAGNOSIS — R52 Pain, unspecified: Secondary | ICD-10-CM | POA: Diagnosis not present

## 2023-05-13 DIAGNOSIS — Z79899 Other long term (current) drug therapy: Secondary | ICD-10-CM | POA: Diagnosis not present

## 2023-05-13 DIAGNOSIS — F5105 Insomnia due to other mental disorder: Secondary | ICD-10-CM | POA: Diagnosis not present

## 2023-05-13 DIAGNOSIS — F331 Major depressive disorder, recurrent, moderate: Secondary | ICD-10-CM | POA: Diagnosis not present

## 2023-05-13 DIAGNOSIS — I69320 Aphasia following cerebral infarction: Secondary | ICD-10-CM | POA: Diagnosis not present

## 2023-05-13 DIAGNOSIS — G8929 Other chronic pain: Secondary | ICD-10-CM | POA: Diagnosis not present

## 2023-05-21 DIAGNOSIS — F4321 Adjustment disorder with depressed mood: Secondary | ICD-10-CM | POA: Diagnosis not present

## 2023-05-23 DIAGNOSIS — R1084 Generalized abdominal pain: Secondary | ICD-10-CM | POA: Diagnosis not present

## 2023-05-23 DIAGNOSIS — R112 Nausea with vomiting, unspecified: Secondary | ICD-10-CM | POA: Diagnosis not present

## 2023-05-24 DIAGNOSIS — R112 Nausea with vomiting, unspecified: Secondary | ICD-10-CM | POA: Diagnosis not present

## 2023-05-24 DIAGNOSIS — R1084 Generalized abdominal pain: Secondary | ICD-10-CM | POA: Diagnosis not present

## 2023-05-24 DIAGNOSIS — N390 Urinary tract infection, site not specified: Secondary | ICD-10-CM | POA: Diagnosis not present

## 2023-05-24 DIAGNOSIS — R5381 Other malaise: Secondary | ICD-10-CM | POA: Diagnosis not present

## 2023-05-25 DIAGNOSIS — R5381 Other malaise: Secondary | ICD-10-CM | POA: Diagnosis not present

## 2023-05-25 DIAGNOSIS — I69320 Aphasia following cerebral infarction: Secondary | ICD-10-CM | POA: Diagnosis not present

## 2023-05-25 DIAGNOSIS — R531 Weakness: Secondary | ICD-10-CM | POA: Diagnosis not present

## 2023-05-25 DIAGNOSIS — R410 Disorientation, unspecified: Secondary | ICD-10-CM | POA: Diagnosis not present

## 2023-05-26 DIAGNOSIS — R5381 Other malaise: Secondary | ICD-10-CM | POA: Diagnosis not present

## 2023-05-26 DIAGNOSIS — R531 Weakness: Secondary | ICD-10-CM | POA: Diagnosis not present

## 2023-05-26 DIAGNOSIS — R410 Disorientation, unspecified: Secondary | ICD-10-CM | POA: Diagnosis not present

## 2023-05-26 DIAGNOSIS — I69320 Aphasia following cerebral infarction: Secondary | ICD-10-CM | POA: Diagnosis not present

## 2023-05-27 DIAGNOSIS — N390 Urinary tract infection, site not specified: Secondary | ICD-10-CM | POA: Diagnosis not present

## 2023-05-27 DIAGNOSIS — F5105 Insomnia due to other mental disorder: Secondary | ICD-10-CM | POA: Diagnosis not present

## 2023-05-27 DIAGNOSIS — F331 Major depressive disorder, recurrent, moderate: Secondary | ICD-10-CM | POA: Diagnosis not present

## 2023-05-27 DIAGNOSIS — F411 Generalized anxiety disorder: Secondary | ICD-10-CM | POA: Diagnosis not present

## 2023-05-27 DIAGNOSIS — I69320 Aphasia following cerebral infarction: Secondary | ICD-10-CM | POA: Diagnosis not present

## 2023-05-27 DIAGNOSIS — I639 Cerebral infarction, unspecified: Secondary | ICD-10-CM | POA: Diagnosis not present

## 2023-05-28 DIAGNOSIS — R410 Disorientation, unspecified: Secondary | ICD-10-CM | POA: Diagnosis not present

## 2023-05-28 DIAGNOSIS — I69320 Aphasia following cerebral infarction: Secondary | ICD-10-CM | POA: Diagnosis not present

## 2023-05-28 DIAGNOSIS — R5381 Other malaise: Secondary | ICD-10-CM | POA: Diagnosis not present

## 2023-05-28 DIAGNOSIS — R531 Weakness: Secondary | ICD-10-CM | POA: Diagnosis not present

## 2023-06-06 DIAGNOSIS — R300 Dysuria: Secondary | ICD-10-CM | POA: Diagnosis not present

## 2023-06-06 DIAGNOSIS — R1084 Generalized abdominal pain: Secondary | ICD-10-CM | POA: Diagnosis not present

## 2023-06-06 DIAGNOSIS — R112 Nausea with vomiting, unspecified: Secondary | ICD-10-CM | POA: Diagnosis not present

## 2023-06-07 DIAGNOSIS — R300 Dysuria: Secondary | ICD-10-CM | POA: Diagnosis not present

## 2023-06-07 DIAGNOSIS — I10 Essential (primary) hypertension: Secondary | ICD-10-CM | POA: Diagnosis not present

## 2023-06-07 DIAGNOSIS — E119 Type 2 diabetes mellitus without complications: Secondary | ICD-10-CM | POA: Diagnosis not present

## 2023-06-07 DIAGNOSIS — R1084 Generalized abdominal pain: Secondary | ICD-10-CM | POA: Diagnosis not present

## 2023-06-08 DIAGNOSIS — N181 Chronic kidney disease, stage 1: Secondary | ICD-10-CM | POA: Diagnosis not present

## 2023-06-08 DIAGNOSIS — N390 Urinary tract infection, site not specified: Secondary | ICD-10-CM | POA: Diagnosis not present

## 2023-06-08 DIAGNOSIS — D649 Anemia, unspecified: Secondary | ICD-10-CM | POA: Diagnosis not present

## 2023-06-08 DIAGNOSIS — E1165 Type 2 diabetes mellitus with hyperglycemia: Secondary | ICD-10-CM | POA: Diagnosis not present

## 2023-06-08 DIAGNOSIS — R531 Weakness: Secondary | ICD-10-CM | POA: Diagnosis not present

## 2023-06-08 DIAGNOSIS — R1084 Generalized abdominal pain: Secondary | ICD-10-CM | POA: Diagnosis not present

## 2023-06-08 DIAGNOSIS — I69320 Aphasia following cerebral infarction: Secondary | ICD-10-CM | POA: Diagnosis not present

## 2023-06-09 DIAGNOSIS — R531 Weakness: Secondary | ICD-10-CM | POA: Diagnosis not present

## 2023-06-09 DIAGNOSIS — I69351 Hemiplegia and hemiparesis following cerebral infarction affecting right dominant side: Secondary | ICD-10-CM | POA: Diagnosis not present

## 2023-06-09 DIAGNOSIS — R2689 Other abnormalities of gait and mobility: Secondary | ICD-10-CM | POA: Diagnosis not present

## 2023-06-09 DIAGNOSIS — I69320 Aphasia following cerebral infarction: Secondary | ICD-10-CM | POA: Diagnosis not present

## 2023-06-09 DIAGNOSIS — R1084 Generalized abdominal pain: Secondary | ICD-10-CM | POA: Diagnosis not present

## 2023-06-10 DIAGNOSIS — I69320 Aphasia following cerebral infarction: Secondary | ICD-10-CM | POA: Diagnosis not present

## 2023-06-10 DIAGNOSIS — I69351 Hemiplegia and hemiparesis following cerebral infarction affecting right dominant side: Secondary | ICD-10-CM | POA: Diagnosis not present

## 2023-06-10 DIAGNOSIS — R2689 Other abnormalities of gait and mobility: Secondary | ICD-10-CM | POA: Diagnosis not present

## 2023-06-10 DIAGNOSIS — R1084 Generalized abdominal pain: Secondary | ICD-10-CM | POA: Diagnosis not present

## 2023-06-11 DIAGNOSIS — I69351 Hemiplegia and hemiparesis following cerebral infarction affecting right dominant side: Secondary | ICD-10-CM | POA: Diagnosis not present

## 2023-06-11 DIAGNOSIS — R2689 Other abnormalities of gait and mobility: Secondary | ICD-10-CM | POA: Diagnosis not present

## 2023-06-12 DIAGNOSIS — E1165 Type 2 diabetes mellitus with hyperglycemia: Secondary | ICD-10-CM | POA: Diagnosis not present

## 2023-06-12 DIAGNOSIS — N181 Chronic kidney disease, stage 1: Secondary | ICD-10-CM | POA: Diagnosis not present

## 2023-06-12 DIAGNOSIS — D649 Anemia, unspecified: Secondary | ICD-10-CM | POA: Diagnosis not present

## 2023-06-12 DIAGNOSIS — I10 Essential (primary) hypertension: Secondary | ICD-10-CM | POA: Diagnosis not present

## 2023-06-12 DIAGNOSIS — R5383 Other fatigue: Secondary | ICD-10-CM | POA: Diagnosis not present

## 2023-06-12 DIAGNOSIS — I69320 Aphasia following cerebral infarction: Secondary | ICD-10-CM | POA: Diagnosis not present

## 2023-06-14 DIAGNOSIS — R109 Unspecified abdominal pain: Secondary | ICD-10-CM | POA: Diagnosis not present

## 2023-06-15 DIAGNOSIS — R112 Nausea with vomiting, unspecified: Secondary | ICD-10-CM | POA: Diagnosis not present

## 2023-06-15 DIAGNOSIS — R1084 Generalized abdominal pain: Secondary | ICD-10-CM | POA: Diagnosis not present

## 2023-06-15 DIAGNOSIS — K5900 Constipation, unspecified: Secondary | ICD-10-CM | POA: Diagnosis not present

## 2023-06-16 ENCOUNTER — Other Ambulatory Visit: Payer: Self-pay

## 2023-06-16 ENCOUNTER — Emergency Department (HOSPITAL_COMMUNITY)
Admission: EM | Admit: 2023-06-16 | Discharge: 2023-06-17 | Disposition: A | Discharge status: Home / Self Care | Payer: Medicaid Other | Attending: Emergency Medicine | Admitting: Emergency Medicine

## 2023-06-16 ENCOUNTER — Emergency Department (HOSPITAL_COMMUNITY): Payer: Medicaid Other

## 2023-06-16 ENCOUNTER — Encounter (HOSPITAL_COMMUNITY): Payer: Self-pay

## 2023-06-16 DIAGNOSIS — E119 Type 2 diabetes mellitus without complications: Secondary | ICD-10-CM | POA: Diagnosis not present

## 2023-06-16 DIAGNOSIS — Z7982 Long term (current) use of aspirin: Secondary | ICD-10-CM | POA: Insufficient documentation

## 2023-06-16 DIAGNOSIS — N3001 Acute cystitis with hematuria: Secondary | ICD-10-CM | POA: Diagnosis not present

## 2023-06-16 DIAGNOSIS — I10 Essential (primary) hypertension: Secondary | ICD-10-CM | POA: Diagnosis not present

## 2023-06-16 DIAGNOSIS — R109 Unspecified abdominal pain: Secondary | ICD-10-CM | POA: Diagnosis not present

## 2023-06-16 DIAGNOSIS — Z79899 Other long term (current) drug therapy: Secondary | ICD-10-CM | POA: Insufficient documentation

## 2023-06-16 DIAGNOSIS — Z794 Long term (current) use of insulin: Secondary | ICD-10-CM | POA: Diagnosis not present

## 2023-06-16 DIAGNOSIS — R1032 Left lower quadrant pain: Secondary | ICD-10-CM | POA: Diagnosis present

## 2023-06-16 DIAGNOSIS — R112 Nausea with vomiting, unspecified: Secondary | ICD-10-CM | POA: Diagnosis not present

## 2023-06-16 DIAGNOSIS — K649 Unspecified hemorrhoids: Secondary | ICD-10-CM

## 2023-06-16 DIAGNOSIS — K429 Umbilical hernia without obstruction or gangrene: Secondary | ICD-10-CM | POA: Diagnosis not present

## 2023-06-16 DIAGNOSIS — R1084 Generalized abdominal pain: Secondary | ICD-10-CM | POA: Diagnosis not present

## 2023-06-16 DIAGNOSIS — D259 Leiomyoma of uterus, unspecified: Secondary | ICD-10-CM | POA: Diagnosis not present

## 2023-06-16 LAB — URINALYSIS, ROUTINE W REFLEX MICROSCOPIC
Bilirubin Urine: NEGATIVE
Glucose, UA: NEGATIVE mg/dL
Ketones, ur: NEGATIVE mg/dL
Nitrite: NEGATIVE
Protein, ur: NEGATIVE mg/dL
Specific Gravity, Urine: 1.017 (ref 1.005–1.030)
pH: 5 (ref 5.0–8.0)

## 2023-06-16 LAB — CBC
HCT: 28.6 % — ABNORMAL LOW (ref 36.0–46.0)
Hemoglobin: 8.5 g/dL — ABNORMAL LOW (ref 12.0–15.0)
MCH: 19.3 pg — ABNORMAL LOW (ref 26.0–34.0)
MCHC: 29.7 g/dL — ABNORMAL LOW (ref 30.0–36.0)
MCV: 64.9 fL — ABNORMAL LOW (ref 80.0–100.0)
Platelets: 377 10*3/uL (ref 150–400)
RBC: 4.41 MIL/uL (ref 3.87–5.11)
RDW: 21.5 % — ABNORMAL HIGH (ref 11.5–15.5)
WBC: 9.7 10*3/uL (ref 4.0–10.5)
nRBC: 0 % (ref 0.0–0.2)

## 2023-06-16 LAB — COMPREHENSIVE METABOLIC PANEL
ALT: 26 U/L (ref 0–44)
AST: 21 U/L (ref 15–41)
Albumin: 3.2 g/dL — ABNORMAL LOW (ref 3.5–5.0)
Alkaline Phosphatase: 88 U/L (ref 38–126)
Anion gap: 10 (ref 5–15)
BUN: 6 mg/dL — ABNORMAL LOW (ref 8–23)
CO2: 22 mmol/L (ref 22–32)
Calcium: 9 mg/dL (ref 8.9–10.3)
Chloride: 108 mmol/L (ref 98–111)
Creatinine, Ser: 0.61 mg/dL (ref 0.44–1.00)
GFR, Estimated: >60 mL/min (ref >60)
Glucose, Bld: 109 mg/dL — ABNORMAL HIGH (ref 70–99)
Potassium: 3.2 mmol/L — ABNORMAL LOW (ref 3.5–5.1)
Sodium: 140 mmol/L (ref 135–145)
Total Bilirubin: 0.5 mg/dL (ref 0.3–1.2)
Total Protein: 6.7 g/dL (ref 6.5–8.1)

## 2023-06-16 LAB — LIPASE, BLOOD: Lipase: 28 U/L (ref 11–51)

## 2023-06-16 MED ORDER — SODIUM CHLORIDE 0.9 % IV SOLN
1.0000 g | Freq: Once | INTRAVENOUS | Status: AC
  Administered 2023-06-16: 1 g via INTRAVENOUS
  Filled 2023-06-16: qty 10

## 2023-06-16 MED ORDER — MORPHINE SULFATE (PF) 4 MG/ML IV SOLN
4.0000 mg | Freq: Once | INTRAVENOUS | Status: AC
  Administered 2023-06-16: 4 mg via INTRAVENOUS
  Filled 2023-06-16: qty 1

## 2023-06-16 MED ORDER — CEPHALEXIN 500 MG PO CAPS: 500.0000 mg | ORAL_CAPSULE | Freq: Three times a day (TID) | ORAL | 0 refills | Status: DC

## 2023-06-16 MED ORDER — IOHEXOL 350 MG/ML SOLN
75.0000 mL | Freq: Once | INTRAVENOUS | Status: AC | PRN
  Administered 2023-06-16: 75 mL via INTRAVENOUS

## 2023-06-16 NOTE — ED Triage Notes (Signed)
Pt brought in from [facility]. Pt complaining of lower left abdominal pain and pain in the groin.

## 2023-06-16 NOTE — ED Provider Notes (Signed)
[place] EMERGENCY DEPARTMENT AT [facility] Provider Note   CSN: [id number] Arrival date & time: 06/16/23  1844     History  Chief Complaint  Patient presents with   Abdominal Pain    Bailey [name] is a 67 y.o. female past medical history seen for diabetes, acid reflux, hyperlipidemia, hypertension who is nonverbal at this time secondary to recent stroke who presents with concern for lower left abdominal pain, radiating towards the groin.  She denies any nausea, vomiting, changes in bowels.  She denies any chest pain or shortness of breath.  She relates her answers by nodding yes or no to questions.   Abdominal Pain      Home Medications Prior to Admission medications   Medication Sig Start Date End Date Taking? Authorizing Provider  acetaminophen (TYLENOL) 500 MG tablet Take 1,000 mg by mouth every 6 (six) hours as needed (pain). Reported on 04/08/2016    [provider]  albuterol (VENTOLIN HFA) 108 (90 Base) MCG/ACT inhaler Inhale 2 puffs into the lungs every 6 (six) hours as needed for wheezing or shortness of breath. 08/21/21   Copland, Gwenlyn Found, MD  alendronate (FOSAMAX) 70 MG tablet Take 1 tablet (70 mg total) by mouth every 7 (seven) days. Take with a full glass of water on an empty stomach. 01/05/22   Copland, Gwenlyn Found, MD  aspirin 81 MG chewable tablet Chew 1 tablet (81 mg total) by mouth daily. 05/29/22   Marvel Plan, MD  atorvastatin (LIPITOR) 40 MG tablet TAKE 1 TABLET EVERY DAY 12/12/21   Copland, Gwenlyn Found, MD  Boric Acid Vaginal (AZO BORIC ACID) 600 MG SUPP Place 1 suppository vaginally at bedtime. 01/02/22   Brock Bad, MD  butalbital-acetaminophen-caffeine (FIORICET) [phone] MG tablet Take 1-2 tablets by mouth every 6 (six) hours as needed for headache. Max 6 per day 04/16/22   Copland, Gwenlyn Found, MD  cholestyramine (QUESTRAN) 4 g packet DISSOLVE & TAKE 1 POWDER PACKET BY MOUTH TWICE DAILY 02/19/21   Copland, Gwenlyn Found, MD   cyclobenzaprine (FLEXERIL) 5 MG tablet Take 1 tablet (5 mg total) by mouth at bedtime. Use as needed for neck pain 07/02/21   Copland, Gwenlyn Found, MD  insulin aspart (NOVOLOG) 100 UNIT/ML injection Inject 4 Units into the skin 3 (three) times daily with meals. 05/28/22   Marvel Plan, MD  insulin glargine-yfgn (SEMGLEE) 100 UNIT/ML injection Inject 0.24 mLs (24 Units total) into the skin daily. 05/29/22   Marvel Plan, MD  lisinopril (ZESTRIL) 40 MG tablet Take 1 tablet (40 mg total) by mouth daily. 04/16/22   Copland, Gwenlyn Found, MD  Multiple Vitamin (MULTIVITAMIN WITH MINERALS) TABS tablet Take 1 tablet by mouth daily. 05/29/22   Marvel Plan, MD  pantoprazole (PROTONIX) 40 MG tablet Take 1 tablet (40 mg total) by mouth 2 (two) times daily. 02/20/22   Copland, Gwenlyn Found, MD  ticagrelor (BRILINTA) 90 MG TABS tablet Take 1 tablet (90 mg total) by mouth 2 (two) times daily. 05/28/22   Marvel Plan, MD  triamcinolone cream (KENALOG) 0.1 % Apply 1 application topically 2 (two) times daily. Use as needed on dry or scaly areas 03/30/19   Copland, Gwenlyn Found, MD      Allergies    Patient has no known allergies.    Review of Systems   Review of Systems  Gastrointestinal:  Positive for abdominal pain.  All other systems reviewed and are negative.   Physical Exam Updated Vital Signs BP (!) 149/55  Pulse 83   Temp 98.1 F (36.7 C) (Oral)   Resp 16   Ht 5\' 5"  (1.651 m)   Wt 79.4 kg   SpO2 100%   BMI 29.12 kg/m  Physical Exam Vitals and nursing note reviewed.  Constitutional:      General: She is not in acute distress.    Appearance: Normal appearance.  HENT:     Head: Normocephalic and atraumatic.  Eyes:     General:        Right eye: No discharge.        Left eye: No discharge.  Cardiovascular:     Rate and Rhythm: Normal rate and regular rhythm.     Heart sounds: No murmur heard.    No friction rub. No gallop.  Pulmonary:     Effort: Pulmonary effort is normal.     Breath sounds: Normal breath  sounds.  Abdominal:     General: Bowel sounds are normal.     Palpations: Abdomen is soft.     Comments: Focal tenderness to palpation in the left lower quadrant with some guarding, no rebound, rigidity.  Normal bowel sounds throughout.  Skin:    General: Skin is warm and dry.     Capillary Refill: Capillary refill takes less than 2 seconds.  Neurological:     Mental Status: She is alert and oriented to person, place, and time.     Comments: At baseline, non verbal but responding in grunts and nods appropriately  Psychiatric:        Mood and Affect: Mood normal.        Behavior: Behavior normal.     ED Results / Procedures / Treatments   Labs (all labs ordered are listed, but only abnormal results are displayed) Labs Reviewed  COMPREHENSIVE METABOLIC PANEL - Abnormal; Notable for the following components:      Result Value   Potassium 3.2 (*)    Glucose, Bld 109 (*)    BUN 6 (*)    Albumin 3.2 (*)    All other components within normal limits  CBC - Abnormal; Notable for the following components:   Hemoglobin 8.5 (*)    HCT 28.6 (*)    MCV 64.9 (*)    MCH 19.3 (*)    MCHC 29.7 (*)    RDW 21.5 (*)    All other components within normal limits  URINALYSIS, ROUTINE W REFLEX MICROSCOPIC - Abnormal; Notable for the following components:   Color, Urine AMBER (*)    APPearance CLOUDY (*)    Hgb urine dipstick SMALL (*)    Leukocytes,Ua LARGE (*)    Bacteria, UA RARE (*)    All other components within normal limits  LIPASE, BLOOD    EKG None  Radiology No results found.  Procedures Procedures    Medications Ordered in ED Medications  cefTRIAXone (ROCEPHIN) 1 g in sodium chloride 0.9 % 100 mL IVPB (1 g Intravenous New Bag/Given 06/16/23 2115)  morphine (PF) 4 MG/ML injection 4 mg (4 mg Intravenous Given 06/16/23 2113)    ED Course/ Medical Decision Making/ A&P                                 Medical Decision Making Amount and/or Complexity of Data Reviewed Labs:  ordered. Radiology: ordered.   This patient is a 67 y.o. female  who presents to the ED for concern of abdominal pain, dysuria.  Differential diagnoses prior to evaluation: The emergent differential diagnosis includes, but is not limited to,  The causes of generalized abdominal pain include but are not limited to AAA, mesenteric ischemia, appendicitis, diverticulitis, DKA, gastritis, gastroenteritis, AMI, nephrolithiasis, pancreatitis, peritonitis, adrenal insufficiency,lead poisoning, iron toxicity, intestinal ischemia, constipation, UTI,SBO/LBO, splenic rupture, biliary disease, IBD, IBS, PUD, or hepatitis. This is not an exhaustive differential.   Past Medical History / Co-morbidities / Social History: diabetes, acid reflux, hyperlipidemia, hypertension who is nonverbal at this time secondary to recent stroke  Additional history: Chart reviewed. Pertinent results include: Reviewed lab work, imaging from recent hospitalization and outpatient neurology visits  Physical Exam: Physical exam performed. The pertinent findings include: Focal tenderness to palpation in the left lower quadrant with some guarding, no rebound, rigidity.  Normal bowel sounds throughout.  Neurologically at baseline, non verbal but responding in grunts and nods appropriately  Lab Tests/Imaging studies: I personally interpreted labs/imaging and the pertinent results include: CBC without significant leukocytosis, she does have a small drop in her hemoglobin, with microcytic quality, BUN is within normal range, no report of dark stool, overall low clinical suspicion for acute GI loss, may be secondary to anemia of chronic disease, versus multiple blood draws over several recent hospital and outpatient follow-up visits for recent stroke.  CMP notable for moderate hypokalemia, potassium 3.2, we will orally replete.  Normal lipase.  UA is notable for large leukocytes, 21-50 white blood cells and rare bacteria, consistent with  urinary tract infection. CTAP pending at this time.   Medications: I ordered medication including morphine for pain, Rocephin for urinary tract infection.  I have reviewed the patients home medicines and have made adjustments as needed.  9:33 PM Care of Bailey [name] transferred to Dr. Silverio Lay at the end of my shift as the patient will require reassessment once labs/imaging have resulted. Patient presentation, ED course, and plan of care discussed with review of all pertinent labs and imaging. Please see his/her note for further details regarding further ED course and disposition. Plan at time of handoff is reassess pending CT abdomen pelvis, discharge with treatment for UTI if no other acute findings noted. This may be altered or completely changed at the discretion of the oncoming team pending results of further workup.   Final Clinical Impression(s) / ED Diagnoses Final diagnoses:  Left lower quadrant abdominal pain  Acute cystitis with hematuria    Rx / DC Orders ED Discharge Orders     None         [place] 06/16/23 2134    Charlynne Pander, MD 06/16/23 2239

## 2023-06-16 NOTE — Discharge Instructions (Addendum)
Your urine showed a UTI.  Your CT scan showed no intra-abdominal process  I recommend Keflex 500 mg 3 times daily for a week.  You have small hemorrhoids. Please mention that to your doctor   See your doctor for follow-up  Return to ER if you have severe abdominal pain or vomiting

## 2023-06-17 DIAGNOSIS — Z7401 Bed confinement status: Secondary | ICD-10-CM | POA: Diagnosis not present

## 2023-06-17 DIAGNOSIS — N390 Urinary tract infection, site not specified: Secondary | ICD-10-CM | POA: Diagnosis not present

## 2023-06-17 DIAGNOSIS — R1084 Generalized abdominal pain: Secondary | ICD-10-CM | POA: Diagnosis not present

## 2023-06-17 DIAGNOSIS — R109 Unspecified abdominal pain: Secondary | ICD-10-CM | POA: Diagnosis not present

## 2023-06-17 DIAGNOSIS — G459 Transient cerebral ischemic attack, unspecified: Secondary | ICD-10-CM | POA: Diagnosis not present

## 2023-06-17 DIAGNOSIS — I959 Hypotension, unspecified: Secondary | ICD-10-CM | POA: Diagnosis not present

## 2023-06-18 DIAGNOSIS — R1084 Generalized abdominal pain: Secondary | ICD-10-CM | POA: Diagnosis not present

## 2023-06-18 DIAGNOSIS — R141 Gas pain: Secondary | ICD-10-CM | POA: Diagnosis not present

## 2023-06-18 DIAGNOSIS — N390 Urinary tract infection, site not specified: Secondary | ICD-10-CM | POA: Diagnosis not present

## 2023-06-20 DIAGNOSIS — R1084 Generalized abdominal pain: Secondary | ICD-10-CM | POA: Diagnosis not present

## 2023-06-20 DIAGNOSIS — N390 Urinary tract infection, site not specified: Secondary | ICD-10-CM | POA: Diagnosis not present

## 2023-06-20 DIAGNOSIS — Z79899 Other long term (current) drug therapy: Secondary | ICD-10-CM | POA: Diagnosis not present

## 2023-06-21 DIAGNOSIS — R1084 Generalized abdominal pain: Secondary | ICD-10-CM | POA: Diagnosis not present

## 2023-06-21 DIAGNOSIS — R109 Unspecified abdominal pain: Secondary | ICD-10-CM | POA: Diagnosis not present

## 2023-06-21 DIAGNOSIS — R300 Dysuria: Secondary | ICD-10-CM | POA: Diagnosis not present

## 2023-06-21 DIAGNOSIS — N3289 Other specified disorders of bladder: Secondary | ICD-10-CM | POA: Diagnosis not present

## 2023-06-22 DIAGNOSIS — N3289 Other specified disorders of bladder: Secondary | ICD-10-CM | POA: Diagnosis not present

## 2023-06-22 DIAGNOSIS — K5900 Constipation, unspecified: Secondary | ICD-10-CM | POA: Diagnosis not present

## 2023-06-22 DIAGNOSIS — R1084 Generalized abdominal pain: Secondary | ICD-10-CM | POA: Diagnosis not present

## 2023-06-22 DIAGNOSIS — I69320 Aphasia following cerebral infarction: Secondary | ICD-10-CM | POA: Diagnosis not present

## 2023-06-22 DIAGNOSIS — N390 Urinary tract infection, site not specified: Secondary | ICD-10-CM | POA: Diagnosis not present

## 2023-06-23 DIAGNOSIS — D649 Anemia, unspecified: Secondary | ICD-10-CM | POA: Diagnosis not present

## 2023-06-23 DIAGNOSIS — I69320 Aphasia following cerebral infarction: Secondary | ICD-10-CM | POA: Diagnosis not present

## 2023-06-23 DIAGNOSIS — E1165 Type 2 diabetes mellitus with hyperglycemia: Secondary | ICD-10-CM | POA: Diagnosis not present

## 2023-06-23 DIAGNOSIS — N181 Chronic kidney disease, stage 1: Secondary | ICD-10-CM | POA: Diagnosis not present

## 2023-06-24 DIAGNOSIS — F331 Major depressive disorder, recurrent, moderate: Secondary | ICD-10-CM | POA: Diagnosis not present

## 2023-06-24 DIAGNOSIS — F411 Generalized anxiety disorder: Secondary | ICD-10-CM | POA: Diagnosis not present

## 2023-06-24 DIAGNOSIS — F5105 Insomnia due to other mental disorder: Secondary | ICD-10-CM | POA: Diagnosis not present

## 2023-06-24 DIAGNOSIS — I69320 Aphasia following cerebral infarction: Secondary | ICD-10-CM | POA: Diagnosis not present

## 2023-06-24 DIAGNOSIS — G8929 Other chronic pain: Secondary | ICD-10-CM | POA: Diagnosis not present

## 2023-07-23 DIAGNOSIS — L602 Onychogryphosis: Secondary | ICD-10-CM | POA: Diagnosis not present

## 2023-07-23 DIAGNOSIS — Z7984 Long term (current) use of oral hypoglycemic drugs: Secondary | ICD-10-CM | POA: Diagnosis not present

## 2023-07-23 DIAGNOSIS — L84 Corns and callosities: Secondary | ICD-10-CM | POA: Diagnosis not present

## 2023-07-23 DIAGNOSIS — E1151 Type 2 diabetes mellitus with diabetic peripheral angiopathy without gangrene: Secondary | ICD-10-CM | POA: Diagnosis not present

## 2023-07-23 DIAGNOSIS — L603 Nail dystrophy: Secondary | ICD-10-CM | POA: Diagnosis not present

## 2023-07-24 DIAGNOSIS — Z79899 Other long term (current) drug therapy: Secondary | ICD-10-CM | POA: Diagnosis not present

## 2023-07-24 DIAGNOSIS — I69320 Aphasia following cerebral infarction: Secondary | ICD-10-CM | POA: Diagnosis not present

## 2023-07-24 DIAGNOSIS — E1165 Type 2 diabetes mellitus with hyperglycemia: Secondary | ICD-10-CM | POA: Diagnosis not present

## 2023-07-24 DIAGNOSIS — I63232 Cerebral infarction due to unspecified occlusion or stenosis of left carotid arteries: Secondary | ICD-10-CM | POA: Diagnosis not present

## 2023-07-24 DIAGNOSIS — E785 Hyperlipidemia, unspecified: Secondary | ICD-10-CM | POA: Diagnosis not present

## 2023-07-25 DIAGNOSIS — R531 Weakness: Secondary | ICD-10-CM | POA: Diagnosis not present

## 2023-07-25 DIAGNOSIS — R42 Dizziness and giddiness: Secondary | ICD-10-CM | POA: Diagnosis not present

## 2023-07-25 DIAGNOSIS — I69320 Aphasia following cerebral infarction: Secondary | ICD-10-CM | POA: Diagnosis not present

## 2023-07-25 DIAGNOSIS — R1110 Vomiting, unspecified: Secondary | ICD-10-CM | POA: Diagnosis not present

## 2023-07-26 DIAGNOSIS — R531 Weakness: Secondary | ICD-10-CM | POA: Diagnosis not present

## 2023-07-26 DIAGNOSIS — D72829 Elevated white blood cell count, unspecified: Secondary | ICD-10-CM | POA: Diagnosis not present

## 2023-07-26 DIAGNOSIS — I69320 Aphasia following cerebral infarction: Secondary | ICD-10-CM | POA: Diagnosis not present

## 2023-07-26 DIAGNOSIS — D649 Anemia, unspecified: Secondary | ICD-10-CM | POA: Diagnosis not present

## 2023-07-26 DIAGNOSIS — E119 Type 2 diabetes mellitus without complications: Secondary | ICD-10-CM | POA: Diagnosis not present

## 2023-07-26 DIAGNOSIS — E559 Vitamin D deficiency, unspecified: Secondary | ICD-10-CM | POA: Diagnosis not present

## 2023-07-26 DIAGNOSIS — H532 Diplopia: Secondary | ICD-10-CM | POA: Diagnosis not present

## 2023-07-27 DIAGNOSIS — R1084 Generalized abdominal pain: Secondary | ICD-10-CM | POA: Diagnosis not present

## 2023-07-27 DIAGNOSIS — D649 Anemia, unspecified: Secondary | ICD-10-CM | POA: Diagnosis not present

## 2023-07-27 DIAGNOSIS — E1165 Type 2 diabetes mellitus with hyperglycemia: Secondary | ICD-10-CM | POA: Diagnosis not present

## 2023-07-27 DIAGNOSIS — E559 Vitamin D deficiency, unspecified: Secondary | ICD-10-CM | POA: Diagnosis not present

## 2023-07-28 DIAGNOSIS — N390 Urinary tract infection, site not specified: Secondary | ICD-10-CM | POA: Diagnosis not present

## 2023-07-29 DIAGNOSIS — F331 Major depressive disorder, recurrent, moderate: Secondary | ICD-10-CM | POA: Diagnosis not present

## 2023-07-29 DIAGNOSIS — F5105 Insomnia due to other mental disorder: Secondary | ICD-10-CM | POA: Diagnosis not present

## 2023-07-29 DIAGNOSIS — F411 Generalized anxiety disorder: Secondary | ICD-10-CM | POA: Diagnosis not present

## 2023-07-29 DIAGNOSIS — G8929 Other chronic pain: Secondary | ICD-10-CM | POA: Diagnosis not present

## 2023-07-29 DIAGNOSIS — R1084 Generalized abdominal pain: Secondary | ICD-10-CM | POA: Diagnosis not present

## 2023-07-29 DIAGNOSIS — I69320 Aphasia following cerebral infarction: Secondary | ICD-10-CM | POA: Diagnosis not present

## 2023-07-29 DIAGNOSIS — R1110 Vomiting, unspecified: Secondary | ICD-10-CM | POA: Diagnosis not present

## 2023-07-31 DIAGNOSIS — H547 Unspecified visual loss: Secondary | ICD-10-CM | POA: Diagnosis not present

## 2023-07-31 DIAGNOSIS — I69320 Aphasia following cerebral infarction: Secondary | ICD-10-CM | POA: Diagnosis not present

## 2023-08-13 DIAGNOSIS — I69320 Aphasia following cerebral infarction: Secondary | ICD-10-CM | POA: Diagnosis not present

## 2023-08-13 DIAGNOSIS — H547 Unspecified visual loss: Secondary | ICD-10-CM | POA: Diagnosis not present

## 2023-08-13 DIAGNOSIS — R451 Restlessness and agitation: Secondary | ICD-10-CM | POA: Diagnosis not present

## 2023-08-14 DIAGNOSIS — R451 Restlessness and agitation: Secondary | ICD-10-CM | POA: Diagnosis not present

## 2023-08-14 DIAGNOSIS — R197 Diarrhea, unspecified: Secondary | ICD-10-CM | POA: Diagnosis not present

## 2023-08-18 DIAGNOSIS — R1084 Generalized abdominal pain: Secondary | ICD-10-CM | POA: Diagnosis not present

## 2023-08-18 DIAGNOSIS — K219 Gastro-esophageal reflux disease without esophagitis: Secondary | ICD-10-CM | POA: Diagnosis not present

## 2023-08-18 DIAGNOSIS — E113293 Type 2 diabetes mellitus with mild nonproliferative diabetic retinopathy without macular edema, bilateral: Secondary | ICD-10-CM | POA: Diagnosis not present

## 2023-08-18 DIAGNOSIS — R109 Unspecified abdominal pain: Secondary | ICD-10-CM | POA: Diagnosis not present

## 2023-08-18 DIAGNOSIS — R1110 Vomiting, unspecified: Secondary | ICD-10-CM | POA: Diagnosis not present

## 2023-08-18 DIAGNOSIS — I69320 Aphasia following cerebral infarction: Secondary | ICD-10-CM | POA: Diagnosis not present

## 2023-08-19 DIAGNOSIS — I69320 Aphasia following cerebral infarction: Secondary | ICD-10-CM | POA: Diagnosis not present

## 2023-08-19 DIAGNOSIS — K567 Ileus, unspecified: Secondary | ICD-10-CM | POA: Diagnosis not present

## 2023-08-19 DIAGNOSIS — R1110 Vomiting, unspecified: Secondary | ICD-10-CM | POA: Diagnosis not present

## 2023-08-20 DIAGNOSIS — K567 Ileus, unspecified: Secondary | ICD-10-CM | POA: Diagnosis not present

## 2023-08-20 DIAGNOSIS — I69320 Aphasia following cerebral infarction: Secondary | ICD-10-CM | POA: Diagnosis not present

## 2023-08-20 DIAGNOSIS — D649 Anemia, unspecified: Secondary | ICD-10-CM | POA: Diagnosis not present

## 2023-08-21 DIAGNOSIS — D649 Anemia, unspecified: Secondary | ICD-10-CM | POA: Diagnosis not present

## 2023-08-21 DIAGNOSIS — N181 Chronic kidney disease, stage 1: Secondary | ICD-10-CM | POA: Diagnosis not present

## 2023-08-21 DIAGNOSIS — K567 Ileus, unspecified: Secondary | ICD-10-CM | POA: Diagnosis not present

## 2023-08-21 DIAGNOSIS — R739 Hyperglycemia, unspecified: Secondary | ICD-10-CM | POA: Diagnosis not present

## 2023-08-21 DIAGNOSIS — I69320 Aphasia following cerebral infarction: Secondary | ICD-10-CM | POA: Diagnosis not present

## 2023-08-22 DIAGNOSIS — K567 Ileus, unspecified: Secondary | ICD-10-CM | POA: Diagnosis not present

## 2023-08-22 DIAGNOSIS — I69320 Aphasia following cerebral infarction: Secondary | ICD-10-CM | POA: Diagnosis not present

## 2023-08-26 ENCOUNTER — Ambulatory Visit: Payer: Medicare HMO | Admitting: Physician Assistant

## 2023-08-26 ENCOUNTER — Encounter: Payer: Self-pay | Admitting: Physician Assistant

## 2023-08-26 VITALS — BP 150/80 | HR 80 | Ht 65.0 in

## 2023-08-26 DIAGNOSIS — R197 Diarrhea, unspecified: Secondary | ICD-10-CM | POA: Diagnosis not present

## 2023-08-26 DIAGNOSIS — R109 Unspecified abdominal pain: Secondary | ICD-10-CM | POA: Diagnosis not present

## 2023-08-26 DIAGNOSIS — R1084 Generalized abdominal pain: Secondary | ICD-10-CM | POA: Diagnosis not present

## 2023-08-26 DIAGNOSIS — Z8719 Personal history of other diseases of the digestive system: Secondary | ICD-10-CM | POA: Diagnosis not present

## 2023-08-26 DIAGNOSIS — R110 Nausea: Secondary | ICD-10-CM | POA: Diagnosis not present

## 2023-08-26 NOTE — Patient Instructions (Signed)
_______________________________________________________  If your blood pressure at your visit was 140/90 or greater, please contact your primary care physician to follow up on this.  _______________________________________________________  If you are age 68 or older, your body mass index should be between 23-30. Your Body mass index is 29.12 kg/m. If this is out of the aforementioned range listed, please consider follow up with your Primary Care Provider.  If you are age 83 or younger, your body mass index should be between 19-25. Your Body mass index is 29.12 kg/m. If this is out of the aformentioned range listed, please consider follow up with your Primary Care Provider.   ________________________________________________________  The [facility] providers would like to encourage you to use [facility] to communicate with providers for non-urgent requests or questions.  Due to long hold times on the telephone, sending your provider a message by [facility] may be a faster and more efficient way to get a response.  Please allow 48 business hours for a response.  Please remember that this is for non-urgent requests.  _______________________________________________________     It was a pleasure to see you today!  Thank you for trusting me with your gastrointestinal care!

## 2023-08-26 NOTE — Progress Notes (Signed)
Chief Complaint: Follow-up abdominal pain  HPI:    Bailey [name] is a 67 year old African-American female with a past medical history as listed below including stroke in September 2023, COPD, diabetes and GERD, known to Dr. Lavon Paganini, who presents to clinic today from her assisted living facility for follow-up of abdominal pain.    11/07/2015 colonoscopy with colonic diverticulosis, random colon biopsies were negative for microscopic colitis.  Repeat recommended in 10 years.   05/09/2019 patient seen in clinic by Dr. Lavon Paganini for reflux.  Noted that her sister had stomach cancer in her 63s and her brother was diagnosed with pancreatic cancer.  At that time her diarrhea was better controlled with cholestyramine.  Was recommended she have an EGD.    05/11/2019 EGD with nodular mucosa in the gastric antrum.  Pathology showed ulcer.    02/19/2021 CMP with glucose elevated at 153 and alk phos minimally elevated at 140 (this has fluctuated in the past as well).    04/01/2021 patient describes she ran out of her cholestyramine the year prior and exchanged her for Kaopectate and it was working about the same.  At that time described having to run to the bathroom with lower abdominal cramping the second then she ate food in the morning.  At that time encouraged the patient to stop the Kaopectate and use her cholestyramine.  She is using a half a packet on a daily basis.  Also prescribed Dicyclomine 20 mg which we discussed taking 1 in the morning and seeing how that helped with her problems.    06/04/2021 patient last seen in clinic and at that time continued with similar problems.  Using a half a packet of cholestyramine in the morning and the rest throughout the day.  Continual abdominal cramping regardless of Dicyclomine 20 mg twice daily.  Also discussed and fecal incontinence and pelvic floor dysfunction.  Lived a high stress lifestyle with varied work hours and helping take care of her sibling.  That time asked  the patient to increase her Dicyclomine to 20 mg 4 times daily.  Also continue Cholestyramine.  At that point she was scheduled for hysterectomy and it was thought many of her symptoms were related to fibroids excetra.  Hoping that things got better after this procedure.    06/16/2023 patient seen in the ER for abdominal pain.  At that time labs with a potassium of 3.2, BUN 6, hemoglobin 8.5, MCV 64.9, lipase normal.  Anemia thought related to chronic disease versus multiple blood draws over several recent hospital and outpatient follow-up visits from stroke.  CTAP with no acute intra-abdominal pathology.  14 mm complex hypoattenuating cortical lesion within the posterior interpolar region of the left kidney possibly demonstrating internal enhancing components.  Recommend dedicated renal mass protocol MRI.  Patient started on Rocephin for UTI.    Today, patient is seen with her sister-in-law who is also her power of attorney and assists with her care.  The patient has aphasia so most of the questions are answered through her sister-in-law.  Unfortunately the patient was not sent with any records from her nursing facility, only the medicines.  We all are not very sure why she was here they were just told that she needed to be followed by her gastroenterologist.  Sister-in-law discusses some abdominal pain over Thanksgiving and an x-ray with possible ileus, but then the patient had a very good bowel movement and apparently all her pain went away.  She has been stable since then.  We do call the nurse for the patient while I am in the room.  He does not really know any details either, unable to tell me what exact medication she is taking or her bowel movement status.  He does not think she has been complaining of any chronic abdominal pain.  Today the patient shakes her head no when I ask her if she is having any GI issues.    Denies fever or chills.    Past Medical History:  Diagnosis Date   Arthritis     Cataract    COPD (chronic obstructive pulmonary disease) (HCC)    Diabetes mellitus without complication (HCC)    Fibrocystic breast disease July 2016   GERD (gastroesophageal reflux disease)    Hyperlipidemia    Hypertension     Past Surgical History:  Procedure Laterality Date   BREAST BIOPSY Right 04/19/2015   benign   BREAST BIOPSY Right 11/01/2020   fibroadenoma   CATARACT EXTRACTION     CHOLECYSTECTOMY     COLONOSCOPY     GALLBLADDER SURGERY     IR ANGIO INTRA EXTRACRAN SEL COM CAROTID INNOMINATE UNI R MOD SED  05/16/2022   IR ANGIO VERTEBRAL SEL VERTEBRAL UNI L MOD SED  05/16/2022   IR CT HEAD LTD  05/16/2022   IR CT HEAD LTD  05/16/2022   IR CT HEAD LTD  05/16/2022   IR INTRAVSC STENT CERV CAROTID W/O EMB-PROT MOD SED INC ANGIO  05/16/2022   IR PERCUTANEOUS ART THROMBECTOMY/INFUSION INTRACRANIAL INC DIAG ANGIO  05/16/2022   RADIOLOGY WITH ANESTHESIA N/A 05/15/2022   Procedure: IR WITH ANESTHESIA;  Surgeon: Radiologist, Medication, MD;  Location: [facility] OR;  Service: Radiology;  Laterality: N/A;   UTERINE FIBROID SURGERY      Current Outpatient Medications  Medication Sig Dispense Refill   acetaminophen (TYLENOL) 500 MG tablet Take 1,000 mg by mouth every 6 (six) hours as needed (pain). Reported on 04/08/2016     albuterol (VENTOLIN HFA) 108 (90 Base) MCG/ACT inhaler Inhale 2 puffs into the lungs every 6 (six) hours as needed for wheezing or shortness of breath. 1 each 3   alendronate (FOSAMAX) 70 MG tablet Take 1 tablet (70 mg total) by mouth every 7 (seven) days. Take with a full glass of water on an empty stomach. 12 tablet 3   aspirin 81 MG chewable tablet Chew 1 tablet (81 mg total) by mouth daily. 90 tablet 1   atorvastatin (LIPITOR) 40 MG tablet TAKE 1 TABLET EVERY DAY 90 tablet 1   Boric Acid Vaginal (AZO BORIC ACID) 600 MG SUPP Place 1 suppository vaginally at bedtime. 14 suppository 0   butalbital-acetaminophen-caffeine (FIORICET) 50-325-40 MG tablet Take 1-2 tablets by  mouth every 6 (six) hours as needed for headache. Max 6 per day 30 tablet 0   cephALEXin (KEFLEX) 500 MG capsule Take 1 capsule (500 mg total) by mouth 3 (three) times daily. 21 capsule 0   cholestyramine (QUESTRAN) 4 g packet DISSOLVE & TAKE 1 POWDER PACKET BY MOUTH TWICE DAILY 180 packet 3   cyclobenzaprine (FLEXERIL) 5 MG tablet Take 1 tablet (5 mg total) by mouth at bedtime. Use as needed for neck pain 30 tablet 1   insulin aspart (NOVOLOG) 100 UNIT/ML injection Inject 4 Units into the skin 3 (three) times daily with meals. 10 mL 11   insulin glargine-yfgn (SEMGLEE) 100 UNIT/ML injection Inject 0.24 mLs (24 Units total) into the skin daily. 10 mL 11   lisinopril (ZESTRIL) 40 MG tablet  Take 1 tablet (40 mg total) by mouth daily. 90 tablet 3   Multiple Vitamin (MULTIVITAMIN WITH MINERALS) TABS tablet Take 1 tablet by mouth daily. 30 tablet 0   pantoprazole (PROTONIX) 40 MG tablet Take 1 tablet (40 mg total) by mouth 2 (two) times daily. 180 tablet 1   ticagrelor (BRILINTA) 90 MG TABS tablet Take 1 tablet (90 mg total) by mouth 2 (two) times daily. 60 tablet 1   triamcinolone cream (KENALOG) 0.1 % Apply 1 application topically 2 (two) times daily. Use as needed on dry or scaly areas 60 g 1   No current facility-administered medications for this visit.    Allergies as of 08/26/2023   (No Known Allergies)    Family History  Problem Relation Age of Onset   Diabetes Mother    Stroke Mother    Dementia Mother    Heart disease Father    Diabetes Father    Diabetes Brother    Dementia Brother    Renal Disease Brother    Diabetes Sister    Narcolepsy Sister    Arthritis Sister    Diabetes Sister    Obesity Sister    Diabetes Brother    Heart disease Brother    Cancer Brother        pancreatic   Diabetes Brother    Diabetes Brother    Stomach cancer Sister    Colon cancer Neg Hx    Esophageal cancer Neg Hx    Rectal cancer Neg Hx     Social History   Socioeconomic History    Marital status: Widowed    Spouse name: Not on file   Number of children: 2   Years of education: 14   Highest education level: Associate degree: occupational, Scientist, product/process development, or vocational program  Occupational History   Occupation: Habilitation tech  Tobacco Use   Smoking status: Every Day    Current packs/day: 1.00    Average packs/day: 1 pack/day for 34.0 years (34.0 ttl pk-yrs)    Types: Cigarettes    Passive exposure: Current   Smokeless tobacco: Never   Tobacco comments:    Tobacco info given 10/11/15  Vaping Use   Vaping status: Never Used  Substance and Sexual Activity   Alcohol use: Yes    Comment: socially   Drug use: No   Sexual activity: Not Currently    Birth control/protection: None  Other Topics Concern   Not on file  Social History Narrative   Not on file   Social Determinants of Health   Financial Resource Strain: Low Risk  (10/23/2021)   Overall Financial Resource Strain (CARDIA)    Difficulty of Paying Living Expenses: Not hard at all  Food Insecurity: No Food Insecurity (10/23/2021)   Hunger Vital Sign    Worried About Running Out of Food in the Last Year: Never true    Ran Out of Food in the Last Year: Never true  Transportation Needs: No Transportation Needs (10/23/2021)   PRAPARE - Administrator, Civil Service (Medical): No    Lack of Transportation (Non-Medical): No  Physical Activity: Inactive (10/23/2021)   Exercise Vital Sign    Days of Exercise per Week: 0 days    Minutes of Exercise per Session: 0 min  Stress: Stress Concern Present (10/23/2021)   [employer] of Occupational Health - Occupational Stress Questionnaire    Feeling of Stress : Rather much  Social Connections: Moderately Integrated (10/23/2021)   Social Connection and Isolation Panel [  NHANES]    Frequency of Communication with Friends and Family: More than three times a week    Frequency of Social Gatherings with Friends and Family: More than three times a week    Attends  Religious Services: More than 4 times per year    Active Member of Clubs or Organizations: No    Attends Engineer, structural: More than 4 times per year    Marital Status: Widowed  Recent Concern: Social Connections - Moderately Isolated (09/25/2021)   Social Connection and Isolation Panel [NHANES]    Frequency of Communication with Friends and Family: More than three times a week    Frequency of Social Gatherings with Friends and Family: Once a week    Attends Religious Services: More than 4 times per year    Active Member of [employer] or Organizations: No    Attends Banker Meetings: Never    Marital Status: Widowed  Intimate Partner Violence: Not At Risk (10/23/2021)   Humiliation, Afraid, Rape, and Kick questionnaire    Fear of Current or Ex-Partner: No    Emotionally Abused: No    Physically Abused: No    Sexually Abused: No    Review of Systems:    Unable to complete due to patient aphasia  Negative per nursing home    Physical Exam:  Vital signs: BP (!) 150/80 (BP Location: Left Arm, Patient Position: Sitting, Cuff Size: Normal)   Pulse 80   Ht 5\' 5"  (1.651 m)   SpO2 98%   BMI 29.12 kg/m    Constitutional:   Pleasant African-American female appears to be in NAD, Well developed, Well nourished, alert and cooperative +aphasia Respiratory: Respirations even and unlabored. Lungs clear to auscultation bilaterally.   No wheezes, crackles, or rhonchi.  Cardiovascular: Normal S1, S2. No MRG. Regular rate and rhythm. No peripheral edema, cyanosis or pallor.  Gastrointestinal:  Soft, nondistended, nontender. No rebound or guarding. Normal bowel sounds. No appreciable masses or hepatomegaly. Rectal:  Not performed.  Msk:  Symmetrical without gross deformities. Without edema, no deformity or joint abnormality. + In wheelchair Psychiatric: +aphasia, calm  RELEVANT LABS AND IMAGING: CBC    Component Value Date/Time   WBC 9.7 06/16/2023 2000   RBC 4.41  06/16/2023 2000   HGB 8.5 (L) 06/16/2023 2000   HCT 28.6 (L) 06/16/2023 2000   PLT 377 06/16/2023 2000   MCV 64.9 (L) 06/16/2023 2000   MCV 69.7 (A) 04/08/2016 1411   MCH 19.3 (L) 06/16/2023 2000   MCHC 29.7 (L) 06/16/2023 2000   RDW 21.5 (H) 06/16/2023 2000   LYMPHSABS 2.1 12/02/2022 1455   MONOABS 0.8 12/02/2022 1455   EOSABS 0.2 12/02/2022 1455   BASOSABS 0.1 12/02/2022 1455    CMP     Component Value Date/Time   NA 140 06/16/2023 2000   K 3.2 (L) 06/16/2023 2000   CL 108 06/16/2023 2000   CO2 22 06/16/2023 2000   GLUCOSE 109 (H) 06/16/2023 2000   BUN 6 (L) 06/16/2023 2000   CREATININE 0.61 06/16/2023 2000   CREATININE 0.87 04/08/2016 1350   CALCIUM 9.0 06/16/2023 2000   PROT 6.7 06/16/2023 2000   ALBUMIN 3.2 (L) 06/16/2023 2000   AST 21 06/16/2023 2000   ALT 26 06/16/2023 2000   ALKPHOS 88 06/16/2023 2000   BILITOT 0.5 06/16/2023 2000   GFRNONAA >60 06/16/2023 2000   GFRAA >60 02/05/2015 0310    Assessment: 1.  Abdominal pain: Described pain over Thanksgiving, possible ileus on  x-ray, again do not have records, did have history of chronic pain related to IBS and is on Dicyclomine, no longer receiving this 2.  History of bile salt induced diarrhea: Previously controlled on Cholestyramine 4 g twice daily, does not appear she is getting this anymore at her nursing facility, not sure if she still having diarrhea or not  Plan: 1.  Spent a long time with this patient and on the phone with the nursing facility, no one can seem to tell me what is going on with her.  Vague reports of a possible ileus over Thanksgiving but has been having bowel movements since then, prior to all of this was having issues with chronic diarrhea doing better on Cholestyramine which it does not sound like she is taking anymore. 2.  Discussed with nursing facility that I need records from her nurse and nurse practitioner visit and we need to decipher exactly what meds she is on.  I have given them the  fax number to this facility and they are supposed to be sending this to me. 3.  Discussed with family once I receive records then I can better evaluate and make a plan for her.  Exam is completely benign today. 4.  Patient to follow in clinic per recommendations after receiving records.  Hyacinth Meeker, PA-C [facility] 08/26/2023, 11:32 AM  Cc: Cyndia Skeeters, MD

## 2023-08-27 DIAGNOSIS — K760 Fatty (change of) liver, not elsewhere classified: Secondary | ICD-10-CM | POA: Diagnosis not present

## 2023-08-27 DIAGNOSIS — R160 Hepatomegaly, not elsewhere classified: Secondary | ICD-10-CM | POA: Diagnosis not present

## 2023-09-01 DIAGNOSIS — Z79899 Other long term (current) drug therapy: Secondary | ICD-10-CM | POA: Diagnosis not present

## 2023-09-01 DIAGNOSIS — N3281 Overactive bladder: Secondary | ICD-10-CM | POA: Diagnosis not present

## 2023-09-01 DIAGNOSIS — E559 Vitamin D deficiency, unspecified: Secondary | ICD-10-CM | POA: Diagnosis not present

## 2023-09-01 DIAGNOSIS — E1165 Type 2 diabetes mellitus with hyperglycemia: Secondary | ICD-10-CM | POA: Diagnosis not present

## 2023-09-01 DIAGNOSIS — R52 Pain, unspecified: Secondary | ICD-10-CM | POA: Diagnosis not present

## 2023-09-01 DIAGNOSIS — K567 Ileus, unspecified: Secondary | ICD-10-CM | POA: Diagnosis not present

## 2023-09-01 DIAGNOSIS — I69320 Aphasia following cerebral infarction: Secondary | ICD-10-CM | POA: Diagnosis not present

## 2023-09-02 DIAGNOSIS — F411 Generalized anxiety disorder: Secondary | ICD-10-CM | POA: Diagnosis not present

## 2023-09-02 DIAGNOSIS — F331 Major depressive disorder, recurrent, moderate: Secondary | ICD-10-CM | POA: Diagnosis not present

## 2023-09-02 DIAGNOSIS — I69320 Aphasia following cerebral infarction: Secondary | ICD-10-CM | POA: Diagnosis not present

## 2023-09-02 DIAGNOSIS — F5105 Insomnia due to other mental disorder: Secondary | ICD-10-CM | POA: Diagnosis not present

## 2023-09-02 DIAGNOSIS — G8929 Other chronic pain: Secondary | ICD-10-CM | POA: Diagnosis not present

## 2023-09-04 ENCOUNTER — Observation Stay (HOSPITAL_COMMUNITY): Payer: Medicare HMO

## 2023-09-04 ENCOUNTER — Emergency Department (HOSPITAL_COMMUNITY): Payer: Medicare HMO

## 2023-09-04 ENCOUNTER — Encounter (HOSPITAL_COMMUNITY): Payer: Self-pay | Admitting: *Deleted

## 2023-09-04 ENCOUNTER — Other Ambulatory Visit: Payer: Self-pay

## 2023-09-04 ENCOUNTER — Inpatient Hospital Stay (HOSPITAL_COMMUNITY)
Admission: EM | Admit: 2023-09-04 | Discharge: 2023-09-10 | DRG: 101 | Disposition: A | Discharge status: Skilled Nursing Facility | Payer: Medicare HMO | Source: Skilled Nursing Facility | Attending: Internal Medicine | Admitting: Internal Medicine

## 2023-09-04 DIAGNOSIS — R0689 Other abnormalities of breathing: Secondary | ICD-10-CM | POA: Diagnosis not present

## 2023-09-04 DIAGNOSIS — D72829 Elevated white blood cell count, unspecified: Secondary | ICD-10-CM | POA: Diagnosis present

## 2023-09-04 DIAGNOSIS — R000 Tachycardia, unspecified: Secondary | ICD-10-CM | POA: Diagnosis present

## 2023-09-04 DIAGNOSIS — F17210 Nicotine dependence, cigarettes, uncomplicated: Secondary | ICD-10-CM | POA: Diagnosis present

## 2023-09-04 DIAGNOSIS — Z823 Family history of stroke: Secondary | ICD-10-CM

## 2023-09-04 DIAGNOSIS — I69351 Hemiplegia and hemiparesis following cerebral infarction affecting right dominant side: Secondary | ICD-10-CM

## 2023-09-04 DIAGNOSIS — R0989 Other specified symptoms and signs involving the circulatory and respiratory systems: Secondary | ICD-10-CM | POA: Diagnosis not present

## 2023-09-04 DIAGNOSIS — G459 Transient cerebral ischemic attack, unspecified: Secondary | ICD-10-CM | POA: Diagnosis not present

## 2023-09-04 DIAGNOSIS — Z833 Family history of diabetes mellitus: Secondary | ICD-10-CM

## 2023-09-04 DIAGNOSIS — R109 Unspecified abdominal pain: Secondary | ICD-10-CM | POA: Diagnosis not present

## 2023-09-04 DIAGNOSIS — Z8261 Family history of arthritis: Secondary | ICD-10-CM

## 2023-09-04 DIAGNOSIS — R6884 Jaw pain: Secondary | ICD-10-CM | POA: Diagnosis not present

## 2023-09-04 DIAGNOSIS — K219 Gastro-esophageal reflux disease without esophagitis: Secondary | ICD-10-CM | POA: Diagnosis present

## 2023-09-04 DIAGNOSIS — Z8489 Family history of other specified conditions: Secondary | ICD-10-CM

## 2023-09-04 DIAGNOSIS — K029 Dental caries, unspecified: Secondary | ICD-10-CM | POA: Diagnosis present

## 2023-09-04 DIAGNOSIS — G40909 Epilepsy, unspecified, not intractable, without status epilepticus: Secondary | ICD-10-CM | POA: Diagnosis not present

## 2023-09-04 DIAGNOSIS — R569 Unspecified convulsions: Secondary | ICD-10-CM | POA: Diagnosis not present

## 2023-09-04 DIAGNOSIS — Z1152 Encounter for screening for COVID-19: Secondary | ICD-10-CM | POA: Diagnosis not present

## 2023-09-04 DIAGNOSIS — Z79899 Other long term (current) drug therapy: Secondary | ICD-10-CM

## 2023-09-04 DIAGNOSIS — Z8673 Personal history of transient ischemic attack (TIA), and cerebral infarction without residual deficits: Secondary | ICD-10-CM | POA: Diagnosis not present

## 2023-09-04 DIAGNOSIS — Z820 Family history of epilepsy and other diseases of the nervous system: Secondary | ICD-10-CM

## 2023-09-04 DIAGNOSIS — Z841 Family history of disorders of kidney and ureter: Secondary | ICD-10-CM

## 2023-09-04 DIAGNOSIS — J449 Chronic obstructive pulmonary disease, unspecified: Secondary | ICD-10-CM | POA: Diagnosis present

## 2023-09-04 DIAGNOSIS — I10 Essential (primary) hypertension: Secondary | ICD-10-CM | POA: Diagnosis present

## 2023-09-04 DIAGNOSIS — E1165 Type 2 diabetes mellitus with hyperglycemia: Secondary | ICD-10-CM | POA: Diagnosis present

## 2023-09-04 DIAGNOSIS — Z7902 Long term (current) use of antithrombotics/antiplatelets: Secondary | ICD-10-CM | POA: Diagnosis not present

## 2023-09-04 DIAGNOSIS — Z8249 Family history of ischemic heart disease and other diseases of the circulatory system: Secondary | ICD-10-CM

## 2023-09-04 DIAGNOSIS — G9389 Other specified disorders of brain: Secondary | ICD-10-CM | POA: Diagnosis not present

## 2023-09-04 DIAGNOSIS — R6510 Systemic inflammatory response syndrome (SIRS) of non-infectious origin without acute organ dysfunction: Secondary | ICD-10-CM

## 2023-09-04 DIAGNOSIS — E119 Type 2 diabetes mellitus without complications: Secondary | ICD-10-CM

## 2023-09-04 DIAGNOSIS — R32 Unspecified urinary incontinence: Secondary | ICD-10-CM | POA: Diagnosis present

## 2023-09-04 DIAGNOSIS — R159 Full incontinence of feces: Secondary | ICD-10-CM | POA: Diagnosis present

## 2023-09-04 DIAGNOSIS — Z794 Long term (current) use of insulin: Secondary | ICD-10-CM

## 2023-09-04 DIAGNOSIS — Z7983 Long term (current) use of bisphosphonates: Secondary | ICD-10-CM

## 2023-09-04 DIAGNOSIS — I771 Stricture of artery: Secondary | ICD-10-CM | POA: Diagnosis not present

## 2023-09-04 DIAGNOSIS — R4182 Altered mental status, unspecified: Secondary | ICD-10-CM

## 2023-09-04 DIAGNOSIS — Z91014 Allergy to mammalian meats: Secondary | ICD-10-CM | POA: Diagnosis not present

## 2023-09-04 DIAGNOSIS — R253 Fasciculation: Secondary | ICD-10-CM | POA: Diagnosis present

## 2023-09-04 DIAGNOSIS — D509 Iron deficiency anemia, unspecified: Secondary | ICD-10-CM | POA: Diagnosis present

## 2023-09-04 DIAGNOSIS — R739 Hyperglycemia, unspecified: Secondary | ICD-10-CM | POA: Diagnosis not present

## 2023-09-04 DIAGNOSIS — R9089 Other abnormal findings on diagnostic imaging of central nervous system: Secondary | ICD-10-CM | POA: Diagnosis not present

## 2023-09-04 DIAGNOSIS — Z7982 Long term (current) use of aspirin: Secondary | ICD-10-CM

## 2023-09-04 DIAGNOSIS — E785 Hyperlipidemia, unspecified: Secondary | ICD-10-CM | POA: Diagnosis present

## 2023-09-04 DIAGNOSIS — M810 Age-related osteoporosis without current pathological fracture: Secondary | ICD-10-CM

## 2023-09-04 DIAGNOSIS — I69320 Aphasia following cerebral infarction: Secondary | ICD-10-CM

## 2023-09-04 DIAGNOSIS — D649 Anemia, unspecified: Secondary | ICD-10-CM

## 2023-09-04 DIAGNOSIS — Z7401 Bed confinement status: Secondary | ICD-10-CM | POA: Diagnosis not present

## 2023-09-04 DIAGNOSIS — Z720 Tobacco use: Secondary | ICD-10-CM

## 2023-09-04 DIAGNOSIS — Z800 Family history of malignant neoplasm of digestive organs: Secondary | ICD-10-CM

## 2023-09-04 DIAGNOSIS — I63232 Cerebral infarction due to unspecified occlusion or stenosis of left carotid arteries: Secondary | ICD-10-CM | POA: Diagnosis not present

## 2023-09-04 DIAGNOSIS — R531 Weakness: Secondary | ICD-10-CM | POA: Diagnosis not present

## 2023-09-04 LAB — RESP PANEL BY RT-PCR (RSV, FLU A&B, COVID)  RVPGX2
Influenza A by PCR: NEGATIVE
Influenza B by PCR: NEGATIVE
Resp Syncytial Virus by PCR: NEGATIVE
SARS Coronavirus 2 by RT PCR: NEGATIVE

## 2023-09-04 LAB — COMPREHENSIVE METABOLIC PANEL
ALT: 24 U/L (ref 0–44)
AST: 24 U/L (ref 15–41)
Albumin: 3.2 g/dL — ABNORMAL LOW (ref 3.5–5.0)
Alkaline Phosphatase: 94 U/L (ref 38–126)
Anion gap: 9 (ref 5–15)
BUN: 10 mg/dL (ref 8–23)
CO2: 21 mmol/L — ABNORMAL LOW (ref 22–32)
Calcium: 9.3 mg/dL (ref 8.9–10.3)
Chloride: 105 mmol/L (ref 98–111)
Creatinine, Ser: 0.69 mg/dL (ref 0.44–1.00)
GFR, Estimated: >60 mL/min (ref >60)
Glucose, Bld: 231 mg/dL — ABNORMAL HIGH (ref 70–99)
Potassium: 4 mmol/L (ref 3.5–5.1)
Sodium: 135 mmol/L (ref 135–145)
Total Bilirubin: 0.4 mg/dL (ref <1.2)
Total Protein: 7.1 g/dL (ref 6.5–8.1)

## 2023-09-04 LAB — I-STAT CHEM 8, ED
BUN: 10 mg/dL (ref 8–23)
Calcium, Ion: 1.16 mmol/L (ref 1.15–1.40)
Chloride: 106 mmol/L (ref 98–111)
Creatinine, Ser: 0.6 mg/dL (ref 0.44–1.00)
Glucose, Bld: 236 mg/dL — ABNORMAL HIGH (ref 70–99)
HCT: 29 % — ABNORMAL LOW (ref 36.0–46.0)
Hemoglobin: 9.9 g/dL — ABNORMAL LOW (ref 12.0–15.0)
Potassium: 4.1 mmol/L (ref 3.5–5.1)
Sodium: 140 mmol/L (ref 135–145)
TCO2: 23 mmol/L (ref 22–32)

## 2023-09-04 LAB — URINALYSIS, W/ REFLEX TO CULTURE (INFECTION SUSPECTED)
Bilirubin Urine: NEGATIVE
Glucose, UA: NEGATIVE mg/dL
Hgb urine dipstick: NEGATIVE
Ketones, ur: NEGATIVE mg/dL
Nitrite: NEGATIVE
Protein, ur: NEGATIVE mg/dL
Specific Gravity, Urine: 1.013 (ref 1.005–1.030)
pH: 5 (ref 5.0–8.0)

## 2023-09-04 LAB — CBC WITH DIFFERENTIAL/PLATELET
Abs Immature Granulocytes: 0 10*3/uL (ref 0.00–0.07)
Basophils Absolute: 0 10*3/uL (ref 0.0–0.1)
Basophils Relative: 0 %
Eosinophils Absolute: 0.5 10*3/uL (ref 0.0–0.5)
Eosinophils Relative: 4 %
HCT: 29 % — ABNORMAL LOW (ref 36.0–46.0)
Hemoglobin: 8.6 g/dL — ABNORMAL LOW (ref 12.0–15.0)
Lymphocytes Relative: 18 %
Lymphs Abs: 2.3 10*3/uL (ref 0.7–4.0)
MCH: 18.8 pg — ABNORMAL LOW (ref 26.0–34.0)
MCHC: 29.7 g/dL — ABNORMAL LOW (ref 30.0–36.0)
MCV: 63.5 fL — ABNORMAL LOW (ref 80.0–100.0)
Monocytes Absolute: 0.4 10*3/uL (ref 0.1–1.0)
Monocytes Relative: 3 %
Neutro Abs: 9.5 10*3/uL — ABNORMAL HIGH (ref 1.7–7.7)
Neutrophils Relative %: 75 %
Platelets: 391 10*3/uL (ref 150–400)
RBC: 4.57 MIL/uL (ref 3.87–5.11)
RDW: 20.9 % — ABNORMAL HIGH (ref 11.5–15.5)
WBC: 12.7 10*3/uL — ABNORMAL HIGH (ref 4.0–10.5)
nRBC: 0 % (ref 0.0–0.2)
nRBC: 0 /100{WBCs}

## 2023-09-04 LAB — IRON AND TIBC
Iron: 17 ug/dL — ABNORMAL LOW (ref 28–170)
Saturation Ratios: 5 % — ABNORMAL LOW (ref 10.4–31.8)
TIBC: 379 ug/dL (ref 250–450)
UIBC: 362 ug/dL

## 2023-09-04 LAB — GLUCOSE, CAPILLARY: Glucose-Capillary: 231 mg/dL — ABNORMAL HIGH (ref 70–99)

## 2023-09-04 LAB — I-STAT CG4 LACTIC ACID, ED
Lactic Acid, Venous: 1.8 mmol/L (ref 0.5–1.9)
Lactic Acid, Venous: 1.4 mmol/L (ref 0.5–1.9)

## 2023-09-04 LAB — TROPONIN I (HIGH SENSITIVITY)
Troponin I (High Sensitivity): 8 ng/L (ref <18)
Troponin I (High Sensitivity): 9 ng/L (ref <18)

## 2023-09-04 MED ORDER — INSULIN ASPART 100 UNIT/ML IJ SOLN
0.0000 [IU] | Freq: Three times a day (TID) | INTRAMUSCULAR | Status: DC
  Administered 2023-09-04 – 2023-09-05 (×2): 5 [IU] via SUBCUTANEOUS
  Administered 2023-09-05: 8 [IU] via SUBCUTANEOUS
  Administered 2023-09-05: 2 [IU] via SUBCUTANEOUS
  Administered 2023-09-05: 3 [IU] via SUBCUTANEOUS
  Administered 2023-09-06: 5 [IU] via SUBCUTANEOUS
  Administered 2023-09-06: 2 [IU] via SUBCUTANEOUS
  Administered 2023-09-06: 5 [IU] via SUBCUTANEOUS
  Administered 2023-09-06 – 2023-09-07 (×2): 8 [IU] via SUBCUTANEOUS
  Administered 2023-09-07: 5 [IU] via SUBCUTANEOUS
  Administered 2023-09-07 (×2): 8 [IU] via SUBCUTANEOUS
  Administered 2023-09-08: 5 [IU] via SUBCUTANEOUS
  Administered 2023-09-08 (×3): 3 [IU] via SUBCUTANEOUS
  Administered 2023-09-09: 5 [IU] via SUBCUTANEOUS
  Administered 2023-09-09 – 2023-09-10 (×5): 3 [IU] via SUBCUTANEOUS

## 2023-09-04 MED ORDER — ACETAMINOPHEN 325 MG PO TABS
650.0000 mg | ORAL_TABLET | Freq: Four times a day (QID) | ORAL | Status: DC | PRN
Start: 2023-09-04 — End: 2023-09-10
  Administered 2023-09-04 – 2023-09-07 (×3): 650 mg via ORAL
  Filled 2023-09-04 (×3): qty 2

## 2023-09-04 MED ORDER — AMLODIPINE BESYLATE 2.5 MG PO TABS
2.5000 mg | ORAL_TABLET | Freq: Every day | ORAL | Status: DC
  Administered 2023-09-05 – 2023-09-10 (×6): 2.5 mg via ORAL
  Filled 2023-09-04 (×6): qty 1

## 2023-09-04 MED ORDER — SODIUM CHLORIDE 0.9 % IV SOLN
1.0000 g | Freq: Once | INTRAVENOUS | Status: AC
  Administered 2023-09-04: 1 g via INTRAVENOUS
  Filled 2023-09-04: qty 10

## 2023-09-04 MED ORDER — VITAMIN D 25 MCG (1000 UNIT) PO TABS
1000.0000 [IU] | ORAL_TABLET | Freq: Every day | ORAL | Status: DC
Start: 2023-09-04 — End: 2023-09-10
  Administered 2023-09-04 – 2023-09-09 (×6): 1000 [IU] via ORAL
  Filled 2023-09-04 (×6): qty 1

## 2023-09-04 MED ORDER — SENNOSIDES-DOCUSATE SODIUM 8.6-50 MG PO TABS
2.0000 | ORAL_TABLET | Freq: Every day | ORAL | Status: DC
  Administered 2023-09-04 – 2023-09-09 (×6): 2 via ORAL
  Filled 2023-09-04 (×6): qty 2

## 2023-09-04 MED ORDER — OMEGA-3-ACID ETHYL ESTERS 1 G PO CAPS
1.0000 g | ORAL_CAPSULE | Freq: Every day | ORAL | Status: DC
  Administered 2023-09-04 – 2023-09-09 (×6): 1 g via ORAL
  Filled 2023-09-04 (×8): qty 1

## 2023-09-04 MED ORDER — PANTOPRAZOLE SODIUM 40 MG PO TBEC
40.0000 mg | DELAYED_RELEASE_TABLET | Freq: Two times a day (BID) | ORAL | Status: DC
  Administered 2023-09-04 – 2023-09-10 (×12): 40 mg via ORAL
  Filled 2023-09-04 (×12): qty 1

## 2023-09-04 MED ORDER — SODIUM CHLORIDE 0.9 % IV BOLUS
500.0000 mL | Freq: Once | INTRAVENOUS | Status: AC
  Administered 2023-09-04: 500 mL via INTRAVENOUS

## 2023-09-04 MED ORDER — ACETAMINOPHEN 500 MG PO TABS
1000.0000 mg | ORAL_TABLET | Freq: Once | ORAL | Status: AC
  Administered 2023-09-04: 1000 mg via ORAL
  Filled 2023-09-04: qty 2

## 2023-09-04 MED ORDER — TICAGRELOR 90 MG PO TABS
90.0000 mg | ORAL_TABLET | Freq: Two times a day (BID) | ORAL | Status: DC
  Administered 2023-09-04 – 2023-09-09 (×11): 90 mg via ORAL
  Filled 2023-09-04 (×11): qty 1

## 2023-09-04 MED ORDER — SODIUM CHLORIDE 0.9 % IV SOLN: 1.0000 g | INTRAVENOUS | Status: DC

## 2023-09-04 MED ORDER — OMEGA 3 1000 MG PO CAPS: 1.0000 | ORAL_CAPSULE | Freq: Every day | ORAL | Status: DC

## 2023-09-04 MED ORDER — ATORVASTATIN CALCIUM 40 MG PO TABS
40.0000 mg | ORAL_TABLET | Freq: Every day | ORAL | Status: DC
  Administered 2023-09-04 – 2023-09-09 (×6): 40 mg via ORAL
  Filled 2023-09-04 (×6): qty 1

## 2023-09-04 MED ORDER — OXYBUTYNIN CHLORIDE ER 5 MG PO TB24
5.0000 mg | ORAL_TABLET | Freq: Every day | ORAL | Status: DC
  Administered 2023-09-05 – 2023-09-10 (×6): 5 mg via ORAL
  Filled 2023-09-04 (×6): qty 1

## 2023-09-04 MED ORDER — ASPIRIN 81 MG PO CHEW
81.0000 mg | CHEWABLE_TABLET | Freq: Every day | ORAL | Status: DC
  Administered 2023-09-05 – 2023-09-09 (×5): 81 mg via ORAL
  Filled 2023-09-04 (×5): qty 1

## 2023-09-04 MED ORDER — SERTRALINE HCL 100 MG PO TABS
100.0000 mg | ORAL_TABLET | Freq: Every day | ORAL | Status: DC
  Administered 2023-09-05 – 2023-09-10 (×6): 100 mg via ORAL
  Filled 2023-09-04 (×6): qty 1

## 2023-09-04 MED ORDER — GABAPENTIN 100 MG PO CAPS
100.0000 mg | ORAL_CAPSULE | Freq: Every day | ORAL | Status: DC
  Administered 2023-09-04 – 2023-09-09 (×6): 100 mg via ORAL
  Filled 2023-09-04 (×6): qty 1

## 2023-09-04 NOTE — Assessment & Plan Note (Signed)
With hyperglycemia -place on SSI

## 2023-09-04 NOTE — ED Notes (Signed)
Cath in and out by myself and the nt  clear urine

## 2023-09-04 NOTE — H&P (Signed)
History and Physical    Patient: Vihana Manganelli WUJ:[id number] DOB: December 25, 1955 DOA: 09/04/2023 DOS: the patient was seen and examined on 09/04/2023 PCP: Cyndia Skeeters, MD  Patient coming from: Home  Chief Complaint:  Chief Complaint  Patient presents with   Seizures   HPI: Emma-Louise Kihn is a 67 y.o. female with medical history significant of HTNM HLD, T2DM, CVA with aphasia sent over from [facility] after pt had tremors that staff thought were seizures.   Hx obtained from son at bedside and sister over the phone as pt is aphasic.  Staff at facility reported 3 consecutive full body shaking episodes. First one lasted for 30 secs, then 1 min for the second and 2 mins for the third. Pt normally incontinent so unable to tell if she had loss of bowel or bladder control. Pt had only been complaining of right sided facial pain. Pt can only say "sort of sort of" and reportedly rubs her hand around right face and jaw.  No hx of prior seizures and no new medication that family is aware of. Family reports she was somewhat groggy when she was first brought to ED but now back to baseline.   She was recently sent to GI on 12/5 for unclear reasons since [facility] did not send any records to GI. She apparently had abdominal pain around thanksgiving time and had abd X-ray demonstrating ileus which had resolved. Family unclear if she has had any melena.   On arrival to ED, she was afebrile, tachycardic HR 110, BP 168/94 on room air.   CBC with leukocytosis of 12.7, hemoglobin stable from September around 8.6 although this has downward trended from 10.7 in March. Lactate of 1.8  BMP notable for hyperglycemia of 236 but no increased anion gap.  Troponin flat at 8 and 9.  Negative COVID/flu/RSV  UA with small leukocyte, negative nitrate and rare bacteria.  CT head was negative.  EDP consulted neurology Dr. Amada Jupiter who recommends keeping for observation obtaining EEG.   They can be reconsulted pending EEG findings.  She was started on empiric IV Rocephin in the ED.  Hospitalist consulted for admission.  Review of Systems: As mentioned in the history of present illness. All other systems reviewed and are negative. Past Medical History:  Diagnosis Date   Arthritis    Cataract    COPD (chronic obstructive pulmonary disease) (HCC)    Diabetes mellitus without complication (HCC)    Fibrocystic breast disease July 2016   GERD (gastroesophageal reflux disease)    Hyperlipidemia    Hypertension    Past Surgical History:  Procedure Laterality Date   BREAST BIOPSY Right 04/19/2015   benign   BREAST BIOPSY Right 11/01/2020   fibroadenoma   CATARACT EXTRACTION     CHOLECYSTECTOMY     COLONOSCOPY     GALLBLADDER SURGERY     IR ANGIO INTRA EXTRACRAN SEL COM CAROTID INNOMINATE UNI R MOD SED  05/16/2022   IR ANGIO VERTEBRAL SEL VERTEBRAL UNI L MOD SED  05/16/2022   IR CT HEAD LTD  05/16/2022   IR CT HEAD LTD  05/16/2022   IR CT HEAD LTD  05/16/2022   IR INTRAVSC STENT CERV CAROTID W/O EMB-PROT MOD SED INC ANGIO  05/16/2022   IR PERCUTANEOUS ART THROMBECTOMY/INFUSION INTRACRANIAL INC DIAG ANGIO  05/16/2022   RADIOLOGY WITH ANESTHESIA N/A 05/15/2022   Procedure: IR WITH ANESTHESIA;  Surgeon: Radiologist, Medication, MD;  Location: [facility] OR;  Service: Radiology;  Laterality:  N/A;   UTERINE FIBROID SURGERY     Social History:  reports that she has been smoking cigarettes. She has a 34 pack-year smoking history. She has been exposed to tobacco smoke. She has never used smokeless tobacco. She reports current alcohol use. She reports that she does not use drugs.  Allergies  Allergen Reactions   Beef-Derived Drug Products     Unknown reaction   Pork-Derived Products     Unknown reaction    Family History  Problem Relation Age of Onset   Diabetes Mother    Stroke Mother    Dementia Mother    Heart disease Father    Diabetes Father    Diabetes Brother    Dementia  Brother    Renal Disease Brother    Diabetes Sister    Narcolepsy Sister    Arthritis Sister    Diabetes Sister    Obesity Sister    Diabetes Brother    Heart disease Brother    Cancer Brother        pancreatic   Diabetes Brother    Diabetes Brother    Stomach cancer Sister    Colon cancer Neg Hx    Esophageal cancer Neg Hx    Rectal cancer Neg Hx     Prior to Admission medications   Medication Sig Start Date End Date Taking? Authorizing Provider  acetaminophen (TYLENOL) 500 MG tablet Take 1,000 mg by mouth every 6 (six) hours as needed (pain). Reported on 04/08/2016    [provider]  albuterol (VENTOLIN HFA) 108 (90 Base) MCG/ACT inhaler Inhale 2 puffs into the lungs every 6 (six) hours as needed for wheezing or shortness of breath. 08/21/21   Copland, Gwenlyn Found, MD  alendronate (FOSAMAX) 70 MG tablet Take 1 tablet (70 mg total) by mouth every 7 (seven) days. Take with a full glass of water on an empty stomach. 01/05/22   Copland, Gwenlyn Found, MD  aspirin 81 MG chewable tablet Chew 1 tablet (81 mg total) by mouth daily. 05/29/22   Marvel Plan, MD  atorvastatin (LIPITOR) 40 MG tablet TAKE 1 TABLET EVERY DAY 12/12/21   Copland, Gwenlyn Found, MD  Boric Acid Vaginal (AZO BORIC ACID) 600 MG SUPP Place 1 suppository vaginally at bedtime. 01/02/22   Brock Bad, MD  butalbital-acetaminophen-caffeine (FIORICET) [phone] MG tablet Take 1-2 tablets by mouth every 6 (six) hours as needed for headache. Max 6 per day 04/16/22   Copland, Gwenlyn Found, MD  cephALEXin (KEFLEX) 500 MG capsule Take 1 capsule (500 mg total) by mouth 3 (three) times daily. 06/16/23   Charlynne Pander, MD  cholestyramine Lanetta Inch) 4 g packet DISSOLVE & TAKE 1 POWDER PACKET BY MOUTH TWICE DAILY 02/19/21   Copland, Gwenlyn Found, MD  cyclobenzaprine (FLEXERIL) 5 MG tablet Take 1 tablet (5 mg total) by mouth at bedtime. Use as needed for neck pain 07/02/21   Copland, Gwenlyn Found, MD  insulin aspart (NOVOLOG) 100 UNIT/ML  injection Inject 4 Units into the skin 3 (three) times daily with meals. 05/28/22   Marvel Plan, MD  insulin glargine-yfgn (SEMGLEE) 100 UNIT/ML injection Inject 0.24 mLs (24 Units total) into the skin daily. 05/29/22   Marvel Plan, MD  lisinopril (ZESTRIL) 40 MG tablet Take 1 tablet (40 mg total) by mouth daily. 04/16/22   Copland, Gwenlyn Found, MD  Multiple Vitamin (MULTIVITAMIN WITH MINERALS) TABS tablet Take 1 tablet by mouth daily. 05/29/22   Marvel Plan, MD  pantoprazole (PROTONIX) 40 MG tablet Take  1 tablet (40 mg total) by mouth 2 (two) times daily. 02/20/22   Copland, Gwenlyn Found, MD  ticagrelor (BRILINTA) 90 MG TABS tablet Take 1 tablet (90 mg total) by mouth 2 (two) times daily. 05/28/22   Marvel Plan, MD  triamcinolone cream (KENALOG) 0.1 % Apply 1 application topically 2 (two) times daily. Use as needed on dry or scaly areas 03/30/19   Copland, Gwenlyn Found, MD    Physical Exam: Vitals:   09/04/23 1900 09/04/23 1945 09/04/23 2005 09/04/23 2024  BP: (!) 152/79 (!) 150/138    Pulse: (!) 102 97 96   Resp: 19 16 15    Temp:    97.8 F (36.6 C)  TempSrc:    Axillary  SpO2: 100% 100% 100%   Weight:      Height:       Constitutional: NAD, calm, comfortable, elderly female sitting upright in bed Eyes: lids and conjunctivae normal ENMT: Mucous membranes are moist. Poor dentition with dental caries to lower molars bilaterally. Slight edema to right facial maxillary region but no erythema or overlying warmth.  Neck: normal, supple Respiratory: clear to auscultation bilaterally, no wheezing, no crackles. Normal respiratory effort. No accessory muscle use.  Cardiovascular: Regular rate and rhythm, no murmurs / rubs / gallops. No extremity edema.  Abdomen: no tenderness, soft Musculoskeletal: no clubbing / cyanosis. No joint deformity upper and lower extremities. Good ROM, no contractures. Normal muscle tone.  Skin: no rashes, lesions, ulcers. No induration Neurologic: Pt alert and able to answer yes or no  questions by saying "sort of sort of" and points to location of pain. Has right hemiparesis, right sided facial droop. Able to follow commands. Psychiatric:  Normal mood.   Data Reviewed:  See HPI  Assessment and Plan: * Seizure-like activity (HCC) -unclear whether it was rigors vs true seizure -CT head negative and she has no new neurological deficits.  -obtain EEG   History of CVA (cerebrovascular accident) -has aphasia but pt able to answer "sort of" to questions -right sided hemiparesis -continue aspirin and Brilinta -continue statin  SIRS (systemic inflammatory response syndrome) (HCC) -meets criteria with tachycardia, tachypneia and leukocytosis but unclear actual source. UA not overly convincing for UTI but will keep on IV Rocephin pending urine culture. She is also complaining of right sided facial pain and has obvious dental caries on exam. Will check Panorex for any indications of dental abscess  Anemia -baseline of 10 in March but has downward trend to 8.6 in September although remains stable now. Family unclear if any melena or bleeding.  -check FOBT, iron studies  Essential hypertension Continue amlodipine   Hyperlipidemia -Continue statin  Type 2 diabetes mellitus treated without insulin (HCC) With hyperglycemia -place on SSI      Advance Care Planning:   Code Status: Full Code   Consults: neurology was curbsided by EDP  Family Communication: Son, daughter-in-law at bedside. Sister over the phone.  Severity of Illness: The appropriate patient status for this patient is OBSERVATION. Observation status is judged to be reasonable and necessary in order to provide the required intensity of service to ensure the patient's safety. The patient's presenting symptoms, physical exam findings, and initial radiographic and laboratory data in the context of their medical condition is felt to place them at decreased risk for further clinical deterioration. Furthermore,  it is anticipated that the patient will be medically stable for discharge from the hospital within 2 midnights of admission.   Author: Anselm Jungling, DO 09/04/2023 8:45  PM  For on call review [email or web address].

## 2023-09-04 NOTE — Assessment & Plan Note (Addendum)
-  has aphasia but pt able to answer "sort of" to questions -right sided hemiparesis -continue aspirin and Brilinta -continue statin

## 2023-09-04 NOTE — Assessment & Plan Note (Signed)
Continue statin. 

## 2023-09-04 NOTE — ED Notes (Signed)
Patient transported to X-ray 

## 2023-09-04 NOTE — Assessment & Plan Note (Signed)
-  meets criteria with tachycardia, tachypneia and leukocytosis but unclear actual source. UA not overly convincing for UTI but will keep on IV Rocephin pending urine culture. She is also complaining of right sided facial pain and has obvious dental caries on exam. Will check Panorex for any indications of dental abscess

## 2023-09-04 NOTE — ED Notes (Signed)
Pts Son requesting to be notified of any changes w/pts status.

## 2023-09-04 NOTE — Progress Notes (Signed)
Patient arrived to room 3w10 from ED.  Assessment complete, VS obtained, and Admission database began.

## 2023-09-04 NOTE — ED Triage Notes (Signed)
The pt arrived by [facility] from [facility]  after the pt had tremors that the staff thought was seizures ems did not witness any seizure activity  hx of stroke  global apasia pt moaning on arrival  tracking staff following simple commands  ems reported  cbg 253

## 2023-09-04 NOTE — ED Notes (Signed)
No tongue damage noted

## 2023-09-04 NOTE — Assessment & Plan Note (Signed)
-  baseline of 10 in March but has downward trend to 8.6 in September although remains stable now. Family unclear if any melena or bleeding.  -check FOBT, iron studies

## 2023-09-04 NOTE — ED Notes (Signed)
The pt just returned from xray 

## 2023-09-04 NOTE — Assessment & Plan Note (Signed)
-   Continue amlodipine ?

## 2023-09-04 NOTE — ED Notes (Signed)
The pt  appears more alert her son and another pt is at  the bedside

## 2023-09-04 NOTE — ED Provider Notes (Signed)
[place] EMERGENCY DEPARTMENT AT [facility] Provider Note   CSN: [id number] Arrival date & time: 09/04/23  1509     History {Add pertinent medical, surgical, social history, OB history to HPI:1} Chief Complaint  Patient presents with   Seizures    Bailey [name] is a 67 y.o. female.  67 year old female with prior medical history as detailed below presents for evaluation.  Patient arrives from [facility].  She was noted by staff to have several "shaking episodes" over the course of the morning.  Staff was concerned about possible seizure activity.  Patient without prior history of seizure.  Patient without noted biting of tongue or postictal period per report.  Patient has significant aphasia after prior stroke.  She is unable to provide significant history.  She is in a diaper.  Urinary incontinence is unknown.  Patient is able to follow simple commands here in the ED.  She appears to be at or close to baseline mental status.  The history is provided by the patient and medical records.       Home Medications Prior to Admission medications   Medication Sig Start Date End Date Taking? Authorizing Provider  acetaminophen (TYLENOL) 500 MG tablet Take 1,000 mg by mouth every 6 (six) hours as needed (pain). Reported on 04/08/2016    [provider]  albuterol (VENTOLIN HFA) 108 (90 Base) MCG/ACT inhaler Inhale 2 puffs into the lungs every 6 (six) hours as needed for wheezing or shortness of breath. 08/21/21   Copland, Gwenlyn Found, MD  alendronate (FOSAMAX) 70 MG tablet Take 1 tablet (70 mg total) by mouth every 7 (seven) days. Take with a full glass of water on an empty stomach. 01/05/22   Copland, Gwenlyn Found, MD  aspirin 81 MG chewable tablet Chew 1 tablet (81 mg total) by mouth daily. 05/29/22   Marvel Plan, MD  atorvastatin (LIPITOR) 40 MG tablet TAKE 1 TABLET EVERY DAY 12/12/21   Copland, Gwenlyn Found, MD  Boric Acid Vaginal (AZO BORIC ACID) 600 MG SUPP Place 1  suppository vaginally at bedtime. 01/02/22   Brock Bad, MD  butalbital-acetaminophen-caffeine (FIORICET) [phone] MG tablet Take 1-2 tablets by mouth every 6 (six) hours as needed for headache. Max 6 per day 04/16/22   Copland, Gwenlyn Found, MD  cephALEXin (KEFLEX) 500 MG capsule Take 1 capsule (500 mg total) by mouth 3 (three) times daily. 06/16/23   Charlynne Pander, MD  cholestyramine Lanetta Inch) 4 g packet DISSOLVE & TAKE 1 POWDER PACKET BY MOUTH TWICE DAILY 02/19/21   Copland, Gwenlyn Found, MD  cyclobenzaprine (FLEXERIL) 5 MG tablet Take 1 tablet (5 mg total) by mouth at bedtime. Use as needed for neck pain 07/02/21   Copland, Gwenlyn Found, MD  insulin aspart (NOVOLOG) 100 UNIT/ML injection Inject 4 Units into the skin 3 (three) times daily with meals. 05/28/22   Marvel Plan, MD  insulin glargine-yfgn (SEMGLEE) 100 UNIT/ML injection Inject 0.24 mLs (24 Units total) into the skin daily. 05/29/22   Marvel Plan, MD  lisinopril (ZESTRIL) 40 MG tablet Take 1 tablet (40 mg total) by mouth daily. 04/16/22   Copland, Gwenlyn Found, MD  Multiple Vitamin (MULTIVITAMIN WITH MINERALS) TABS tablet Take 1 tablet by mouth daily. 05/29/22   Marvel Plan, MD  pantoprazole (PROTONIX) 40 MG tablet Take 1 tablet (40 mg total) by mouth 2 (two) times daily. 02/20/22   Copland, Gwenlyn Found, MD  ticagrelor (BRILINTA) 90 MG TABS tablet Take 1 tablet (90 mg total) by  mouth 2 (two) times daily. 05/28/22   Marvel Plan, MD  triamcinolone cream (KENALOG) 0.1 % Apply 1 application topically 2 (two) times daily. Use as needed on dry or scaly areas 03/30/19   Copland, Gwenlyn Found, MD      Allergies    Patient has no known allergies.    Review of Systems   Review of Systems  All other systems reviewed and are negative.   Physical Exam Updated Vital Signs There were no vitals taken for this visit. Physical Exam Vitals and nursing note reviewed.  Constitutional:      General: She is not in acute distress.    Appearance: Normal appearance. She  is well-developed.  HENT:     Head: Normocephalic and atraumatic.  Eyes:     Conjunctiva/sclera: Conjunctivae normal.     Pupils: Pupils are equal, round, and reactive to light.  Cardiovascular:     Rate and Rhythm: Normal rate and regular rhythm.     Heart sounds: Normal heart sounds.  Pulmonary:     Effort: Pulmonary effort is normal. No respiratory distress.     Breath sounds: Normal breath sounds.  Abdominal:     General: There is no distension.     Palpations: Abdomen is soft.     Tenderness: There is no abdominal tenderness.  Musculoskeletal:        General: No deformity. Normal range of motion.     Cervical back: Normal range of motion and neck supple.  Skin:    General: Skin is warm and dry.  Neurological:     General: No focal deficit present.     Mental Status: She is alert.     Comments: Appears to be at her close to baseline mental status.  Significant aphasia with minimal ability to communicate.  Patient with right-sided paresis after prior stroke as well.  No witnessed seizure activity with EMS or on arrival.     ED Results / Procedures / Treatments   Labs (all labs ordered are listed, but only abnormal results are displayed) Labs Reviewed  CULTURE, BLOOD (ROUTINE X 2)  CULTURE, BLOOD (ROUTINE X 2)  CBC WITH DIFFERENTIAL/PLATELET  COMPREHENSIVE METABOLIC PANEL  URINALYSIS, W/ REFLEX TO CULTURE (INFECTION SUSPECTED)  I-STAT CHEM 8, ED  I-STAT CG4 LACTIC ACID, ED  TROPONIN I (HIGH SENSITIVITY)    EKG None  Radiology No results found.  Procedures Procedures  {Document cardiac monitor, telemetry assessment procedure when appropriate:1}  Medications Ordered in ED Medications  sodium chloride 0.9 % bolus 500 mL (has no administration in time range)    ED Course/ Medical Decision Making/ A&P   {   Click here for ABCD2, HEART and other calculatorsREFRESH Note before signing :1}                              Medical Decision Making Amount and/or  Complexity of Data Reviewed Labs: ordered. Radiology: ordered.  Risk OTC drugs.    Medical Screen Complete  This patient presented to the ED with complaint of ***.  This complaint involves an extensive number of treatment options. The initial differential diagnosis includes, but is not limited to, ***  This presentation is: {IllnessRisk:19196::"***","Acute","Chronic","Self-Limited","Previously Undiagnosed","Uncertain Prognosis","Complicated","Systemic Symptoms","Threat to Life/Bodily Function"}    Co morbidities that complicated the patient's evaluation  ***   Additional history obtained:  Additional history obtained from {History source:19196::"EMS","Spouse","Family","Friend","Caregiver"} External records from outside sources obtained and reviewed including prior ED visits and  prior Inpatient records.    Lab Tests:  I ordered and personally interpreted labs.  The pertinent results include:  ***   Imaging Studies ordered:  I ordered imaging studies including ***  I independently visualized and interpreted obtained imaging which showed *** I agree with the radiologist interpretation.   Cardiac Monitoring:  The patient was maintained on a cardiac monitor.  I personally viewed and interpreted the cardiac monitor which showed an underlying rhythm of: ***   Medicines ordered:  I ordered medication including ***  for ***  Reevaluation of the patient after these medicines showed that the patient: {resolved/improved/worsened:23923::"improved"}    Test Considered:  ***   Critical Interventions:  ***   Consultations Obtained:  I consulted ***,  and discussed lab and imaging findings as well as pertinent plan of care.    Problem List / ED Course:  ***   Reevaluation:  After the interventions noted above, I reevaluated the patient and found that they have: {resolved/improved/worsened:23923::"improved"}   Social Determinants of  Health:  ***   Disposition:  After consideration of the diagnostic results and the patients response to treatment, I feel that the patent would benefit from ***.    {Document critical care time when appropriate:1} {Document review of labs and clinical decision tools ie heart score, Chads2Vasc2 etc:1}  {Document your independent review of radiology images, and any outside records:1} {Document your discussion with family members, caretakers, and with consultants:1} {Document social determinants of health affecting pt's care:1} {Document your decision making why or why not admission, treatments were needed:1} Final Clinical Impression(s) / ED Diagnoses Final diagnoses:  None    Rx / DC Orders ED Discharge Orders     None

## 2023-09-04 NOTE — Assessment & Plan Note (Signed)
-  unclear whether it was rigors vs true seizure -CT head negative and she has no new neurological deficits.  -obtain EEG

## 2023-09-05 DIAGNOSIS — R4182 Altered mental status, unspecified: Secondary | ICD-10-CM

## 2023-09-05 DIAGNOSIS — R569 Unspecified convulsions: Secondary | ICD-10-CM | POA: Diagnosis not present

## 2023-09-05 LAB — GLUCOSE, CAPILLARY
Glucose-Capillary: 174 mg/dL — ABNORMAL HIGH (ref 70–99)
Glucose-Capillary: 201 mg/dL — ABNORMAL HIGH (ref 70–99)
Glucose-Capillary: 253 mg/dL — ABNORMAL HIGH (ref 70–99)
Glucose-Capillary: 142 mg/dL — ABNORMAL HIGH (ref 70–99)

## 2023-09-05 LAB — BASIC METABOLIC PANEL
Anion gap: 7 (ref 5–15)
BUN: 9 mg/dL (ref 8–23)
CO2: 24 mmol/L (ref 22–32)
Calcium: 9.2 mg/dL (ref 8.9–10.3)
Chloride: 107 mmol/L (ref 98–111)
Creatinine, Ser: 0.91 mg/dL (ref 0.44–1.00)
GFR, Estimated: >60 mL/min (ref >60)
Glucose, Bld: 179 mg/dL — ABNORMAL HIGH (ref 70–99)
Potassium: 4 mmol/L (ref 3.5–5.1)
Sodium: 138 mmol/L (ref 135–145)

## 2023-09-05 LAB — CBC
HCT: 26.4 % — ABNORMAL LOW (ref 36.0–46.0)
Hemoglobin: 7.8 g/dL — ABNORMAL LOW (ref 12.0–15.0)
MCH: 18.5 pg — ABNORMAL LOW (ref 26.0–34.0)
MCHC: 29.5 g/dL — ABNORMAL LOW (ref 30.0–36.0)
MCV: 62.7 fL — ABNORMAL LOW (ref 80.0–100.0)
Platelets: 364 10*3/uL (ref 150–400)
RBC: 4.21 MIL/uL (ref 3.87–5.11)
RDW: 21 % — ABNORMAL HIGH (ref 11.5–15.5)
WBC: 9.5 10*3/uL (ref 4.0–10.5)
nRBC: 0 % (ref 0.0–0.2)

## 2023-09-05 MED ORDER — SODIUM CHLORIDE 0.9 % IV SOLN
100.0000 mg | Freq: Once | INTRAVENOUS | Status: AC
  Administered 2023-09-05: 100 mg via INTRAVENOUS
  Filled 2023-09-05: qty 5

## 2023-09-05 MED ORDER — MORPHINE SULFATE (PF) 2 MG/ML IV SOLN
1.0000 mg | INTRAVENOUS | Status: DC | PRN
  Administered 2023-09-05 – 2023-09-09 (×2): 1 mg via INTRAVENOUS
  Filled 2023-09-05 (×2): qty 1

## 2023-09-05 NOTE — Procedures (Signed)
Patient Name: Bailey [name]  MRN: [id number]  Epilepsy Attending: Charlsie Quest  Referring Physician/Provider: Anselm Jungling, DO  Date: 09/05/2023 Duration: 26.57 mins  Patient history:  67 y.o. female with medical history significant of HTNM HLD, T2DM, CVA with aphasia sent over from [facility] after pt had tremors that staff thought were seizures. EEG to evaluate for seizure  Level of alertness: Awake, asleep  AEDs during EEG study: GBP  Technical aspects: This EEG study was done with scalp electrodes positioned according to the 10-20 International system of electrode placement. Electrical activity was reviewed with band pass filter of 1-70Hz , sensitivity of 7 uV/mm, display speed of 18mm/sec with a 60Hz  notched filter applied as appropriate. EEG data were recorded continuously and digitally stored.  Video monitoring was available and reviewed as appropriate.  Description: The posterior dominant rhythm consists of 9 Hz activity of moderate voltage (25-35 uV) seen predominantly in posterior head regions, asymmetric ( left<right) and reactive to eye opening and eye closing. Sleep was characterized by vertex waves, sleep spindles (12 to 14 Hz), maximal frontocentral region. EEG showed continuous 3 to 5 Hz theta-delta slowing in left hemisphere. Hyperventilation and photic stimulation were not performed.     ABNORMALITY - Continuous slow,  left hemisphere  IMPRESSION: This study is suggestive of cortical dysfunction arising from left hemisphere likely secondary to underlying stroke. No seizures or epileptiform discharges were seen throughout the recording.   Tianah Lonardo Annabelle Harman

## 2023-09-05 NOTE — Hospital Course (Signed)
67 y.o. female with medical history significant of HTNM HLD, T2DM, CVA with aphasia sent over from [facility] after pt had tremors that staff thought were seizures.    Staff at facility reported 3 consecutive full body shaking episodes. First one lasted for 30 secs, then 1 min for the second and 2 mins for the third. Pt normally incontinent so unable to tell if she had loss of bowel or bladder control. Pt had only been complaining of right sided facial pain. Pt can only say "sort of sort of" and reportedly rubs her hand around right face and jaw.  No hx of prior seizures and no new medication that family is aware of. Family reported she was somewhat groggy when she was first brought to ED but now back to baseline.

## 2023-09-05 NOTE — Care Management Obs Status (Signed)
MEDICARE OBSERVATION STATUS NOTIFICATION   Patient Details  Name: Kisten Peacock MRN: [id number] Date of Birth: [birth year]   Medicare Observation Status Notification Given:  Yes    Lawerance Sabal, RN 09/05/2023, 2:28 PM

## 2023-09-05 NOTE — Plan of Care (Signed)

## 2023-09-05 NOTE — Progress Notes (Signed)
Progress Note   Patient: Bailey [name] AVW:[id number] DOB: 1955/10/11 DOA: 09/04/2023     0 DOS: the patient was seen and examined on 09/05/2023   Brief hospital course: 67 y.o. female with medical history significant of HTNM HLD, T2DM, CVA with aphasia sent over from [facility] after pt had tremors that staff thought were seizures.    Staff at facility reported 3 consecutive full body shaking episodes. First one lasted for 30 secs, then 1 min for the second and 2 mins for the third. Pt normally incontinent so unable to tell if she had loss of bowel or bladder control. Pt had only been complaining of right sided facial pain. Pt can only say "sort of sort of" and reportedly rubs her hand around right face and jaw.  No hx of prior seizures and no new medication that family is aware of. Family reported she was somewhat groggy when she was first brought to ED but now back to baseline.  Assessment and Plan: Seizure-like activity (HCC) -unclear whether it was rigors vs true seizure -CT head negative and she has no new neurological deficits.  -EEG reviewed. Cortical dysfunction from L hemisphere c/w prior CVA, otherwise neg for seizure -Discussed with Neurology. No convincing evidence to start AED now. Rec to cont to monitor pt and if pt has another "spell," to then involve Neurology formally for consideration for AED at that time     History of CVA (cerebrovascular accident) -has aphasia but pt able to answer "sort of" to questions -right sided hemiparesis -continue aspirin and Brilinta -continue statin -consulted PT/OT   SIRS (systemic inflammatory response syndrome) (HCC) -meets criteria with tachycardia, tachypneia and leukocytosis but unclear actual source. UA not overly convincing for UTI, pt was on empiric IV Rocephin She is also complaining of right sided facial pain and has obvious dental caries on exam. Panorex reviewed. Poor dentition, otherwise unremarkable -Hold  further abx at this time   Anemia -baseline of 10 in March but has downward trend to 8.6 in September although remains stable now.  -Iron low at 17 with iron sat of 5 -Will order IV iron   Essential hypertension Continue amlodipine    Hyperlipidemia -Continue statin   Type 2 diabetes mellitus treated without insulin (HCC) With hyperglycemia -place on SSI   Subjective: Unable to assess given aphasia  Physical Exam: Vitals:   09/05/23 0745 09/05/23 0752 09/05/23 1157 09/05/23 1608  BP: (!) 164/65 (!) 153/64 (!) 178/69 (!) 159/56  Pulse: (!) 20 82 80 94  Resp:  16  18  Temp: 98.3 F (36.8 C) 98.2 F (36.8 C) (!) 97.4 F (36.3 C) 98.1 F (36.7 C)  TempSrc: Oral Oral Oral Oral  SpO2: 100% 100% 100% 99%  Weight:      Height:       General exam: Awake, laying in bed, in nad Respiratory system: Normal respiratory effort, no wheezing Cardiovascular system: regular rate, s1, s2 Gastrointestinal system: Soft, nondistended, positive BS Central nervous system: pt aphasic, R sided weakness Extremities: Perfused, no clubbing Skin: Normal skin turgor, no notable skin lesions seen Psychiatry: Unable to assess given neurologic deficit  Data Reviewed:  Labs reviewed: Na 138, K 4.0, Cr 0.91, WBC 9.5, Hgb 7.8, Plts 364  Family Communication: Pt in room, family not at bedside  Disposition: Status is: Observation The patient remains OBS appropriate and will d/c before 2 midnights.  Planned Discharge Destination: Skilled nursing facility    Author: Rickey Barbara, MD 09/05/2023 4:11  PM  For on call review [email or web address].

## 2023-09-06 DIAGNOSIS — R569 Unspecified convulsions: Secondary | ICD-10-CM | POA: Diagnosis not present

## 2023-09-06 DIAGNOSIS — D649 Anemia, unspecified: Secondary | ICD-10-CM | POA: Diagnosis not present

## 2023-09-06 DIAGNOSIS — E119 Type 2 diabetes mellitus without complications: Secondary | ICD-10-CM | POA: Diagnosis not present

## 2023-09-06 DIAGNOSIS — Z8673 Personal history of transient ischemic attack (TIA), and cerebral infarction without residual deficits: Secondary | ICD-10-CM | POA: Diagnosis not present

## 2023-09-06 LAB — CBC
HCT: 26.3 % — ABNORMAL LOW (ref 36.0–46.0)
Hemoglobin: 7.8 g/dL — ABNORMAL LOW (ref 12.0–15.0)
MCH: 18.6 pg — ABNORMAL LOW (ref 26.0–34.0)
MCHC: 29.7 g/dL — ABNORMAL LOW (ref 30.0–36.0)
MCV: 62.8 fL — ABNORMAL LOW (ref 80.0–100.0)
Platelets: 352 10*3/uL (ref 150–400)
RBC: 4.19 MIL/uL (ref 3.87–5.11)
RDW: 20.9 % — ABNORMAL HIGH (ref 11.5–15.5)
WBC: 11.1 10*3/uL — ABNORMAL HIGH (ref 4.0–10.5)
nRBC: 0.2 % (ref 0.0–0.2)

## 2023-09-06 LAB — COMPREHENSIVE METABOLIC PANEL
ALT: 19 U/L (ref 0–44)
AST: 17 U/L (ref 15–41)
Albumin: 3 g/dL — ABNORMAL LOW (ref 3.5–5.0)
Alkaline Phosphatase: 77 U/L (ref 38–126)
Anion gap: 8 (ref 5–15)
BUN: 9 mg/dL (ref 8–23)
CO2: 23 mmol/L (ref 22–32)
Calcium: 9.3 mg/dL (ref 8.9–10.3)
Chloride: 106 mmol/L (ref 98–111)
Creatinine, Ser: 0.61 mg/dL (ref 0.44–1.00)
GFR, Estimated: >60 mL/min (ref >60)
Glucose, Bld: 183 mg/dL — ABNORMAL HIGH (ref 70–99)
Potassium: 4 mmol/L (ref 3.5–5.1)
Sodium: 137 mmol/L (ref 135–145)
Total Bilirubin: 0.3 mg/dL (ref <1.2)
Total Protein: 6.6 g/dL (ref 6.5–8.1)

## 2023-09-06 LAB — GLUCOSE, CAPILLARY
Glucose-Capillary: 186 mg/dL — ABNORMAL HIGH (ref 70–99)
Glucose-Capillary: 234 mg/dL — ABNORMAL HIGH (ref 70–99)
Glucose-Capillary: 251 mg/dL — ABNORMAL HIGH (ref 70–99)
Glucose-Capillary: 218 mg/dL — ABNORMAL HIGH (ref 70–99)

## 2023-09-06 LAB — HEMOGLOBIN A1C
Hgb A1c MFr Bld: 7 % — ABNORMAL HIGH (ref 4.8–5.6)
Mean Plasma Glucose: 154 mg/dL

## 2023-09-06 MED ORDER — INSULIN GLARGINE-YFGN 100 UNIT/ML ~~LOC~~ SOLN
15.0000 [IU] | Freq: Every day | SUBCUTANEOUS | Status: DC
  Administered 2023-09-06: 15 [IU] via SUBCUTANEOUS
  Filled 2023-09-06 (×2): qty 0.15

## 2023-09-06 NOTE — Inpatient Diabetes Management (Signed)
Inpatient Diabetes Program Recommendations  AACE/ADA: New Consensus Statement on Inpatient Glycemic Control (2015)  Target Ranges:  Prepandial:   less than 140 mg/dL      Peak postprandial:   less than 180 mg/dL (1-2 hours)      Critically ill patients:  140 - 180 mg/dL   Lab Results  Component Value Date   GLUCAP 186 (H) 09/06/2023   HGBA1C 7.0 (H) 09/04/2023    Latest Reference Range & Units 09/05/23 06:30 09/05/23 11:57 09/05/23 16:08 09/05/23 21:29 09/06/23 06:11  Glucose-Capillary 70 - 99 mg/dL 696 (H) 295 (H) 284 (H) 142 (H) 186 (H)  (H): Data is abnormally high  Review of Glycemic Control  Diabetes history: DM2 Outpatient Diabetes medications: Lantus 28 units daily Current orders for Inpatient glycemic control: Novolog 0-15 qid  Inpatient Diabetes Program Recommendations:   Please consider: -Decrease Novolog correction to 0-9 units tid, 0-5 units hs -Add Semglee 5 units daily May need adjustment in dose of Lantus on discharge.  Thank you, Billy Fischer. Chelsey Kimberley, RN, MSN, CDCES  Diabetes Coordinator Inpatient Glycemic Control Team Team Pager [phone] (8am-5pm) 09/06/2023 10:46 AM

## 2023-09-06 NOTE — Evaluation (Signed)
Physical Therapy Evaluation and Discharge Patient Details Name: Bailey [name] MRN: [id number] DOB: October 12, 1955 Today's Date: 09/06/2023  History of Present Illness  67 y.o. female sent over from [facility] after pt had tremors that staff thought were seizures. EEG negative for seizure PMH includes: HTN, HLD, DM II, L MCA CVA with aphasia, and tobacco use.  Clinical Impression   Patient evaluated by Physical Therapy with no further acute PT needs identified. Per chart review, when pt last seen for therapies while hospitalized Sept 2023, she required +2 mod assist for transfers. She currently requires the same. No change in functional status or need for further PT.  PT is signing off. Thank you for this referral.         If plan is discharge home, recommend the following:     Can travel by private vehicle   No    Equipment Recommendations None recommended by PT  Recommendations for Other Services       Functional Status Assessment Patient has not had a recent decline in their functional status     Precautions / Restrictions Precautions Precautions: Fall;Other (comment) Precaution Comments: R hemiparesis      Mobility  Bed Mobility Overal bed mobility: Needs Assistance Bed Mobility: Supine to Sit     Supine to sit: Max assist, +2 for physical assistance, HOB elevated, Used rails     General bed mobility comments: pt assisted with moving LLE over EOB and using LUE on rail to raise torso    Transfers Overall transfer level: Needs assistance Equipment used: None Transfers: Sit to/from Stand, Bed to chair/wheelchair/BSC Sit to Stand: Mod assist, +2 physical assistance   Step pivot transfers: Mod assist, +2 physical assistance       General transfer comment: as coming to stand, RLE pushes into extension with rt foot sliding forward; assisted RLE to position under pt and able to bear weight as stepping LLE around to chair on her left     Ambulation/Gait                  Stairs            Wheelchair Mobility     Tilt Bed    Modified Rankin (Stroke Patients Only) Modified Rankin (Stroke Patients Only) Pre-Morbid Rankin Score: Severe disability Modified Rankin: Severe disability     Balance Overall balance assessment: Needs assistance Sitting-balance support: No upper extremity supported, Feet supported Sitting balance-Leahy Scale: Fair     Standing balance support: Single extremity supported Standing balance-Leahy Scale: Poor                               Pertinent Vitals/Pain Pain Assessment Pain Assessment: Faces Faces Pain Scale: Hurts even more Pain Location: RUE with PROM Pain Descriptors / Indicators: Grimacing Pain Intervention(s): Limited activity within patient's tolerance, Monitored during session, Repositioned    Home Living Family/patient expects to be discharged to:: Other (Comment) (Long-term care)                        Prior Function Prior Level of Function : Needs assist             Mobility Comments: per last therapy notes (05/2022) pt +2 mod assist for transfers       Extremity/Trunk Assessment   Upper Extremity Assessment Upper Extremity Assessment: Defer to OT evaluation    Lower Extremity Assessment Lower  Extremity Assessment: RLE deficits/detail RLE Deficits / Details: +extensor tone in standing; able to bear weight without buckling when stepping LLE; seemed to indicate pain in RLE prior to mobility (rubbing rt knee), however no signs of pain during mobility    Cervical / Trunk Assessment Cervical / Trunk Assessment: Other exceptions Cervical / Trunk Exceptions: shortening on rt side with rt hemi-pelvis elevated in standing  Communication   Communication Communication: Difficulty following commands/understanding;Difficulty communicating thoughts/reduced clarity of speech Following commands: Follows one step commands  inconsistently Cueing Techniques: Verbal cues;Gestural cues;Tactile cues  Cognition Arousal: Alert Behavior During Therapy: WFL for tasks assessed/performed Overall Cognitive Status: Difficult to assess                                 General Comments: following most commands with L side        General Comments General comments (skin integrity, edema, etc.): HR 110 with activity    Exercises     Assessment/Plan    PT Assessment Patient does not need any further PT services  PT Problem List         PT Treatment Interventions      PT Goals (Current goals can be found in the Care Plan section)  Acute Rehab PT Goals Patient Stated Goal: unable PT Goal Formulation: All assessment and education complete, DC therapy    Frequency       Co-evaluation               AM-PAC PT "6 Clicks" Mobility  Outcome Measure Help needed turning from your back to your side while in a flat bed without using bedrails?: A Lot Help needed moving from lying on your back to sitting on the side of a flat bed without using bedrails?: A Lot Help needed moving to and from a bed to a chair (including a wheelchair)?: Total Help needed standing up from a chair using your arms (e.g., wheelchair or bedside chair)?: Total Help needed to walk in hospital room?: Total Help needed climbing 3-5 steps with a railing? : Total 6 Click Score: 8    End of Session Equipment Utilized During Treatment: Gait belt Activity Tolerance: Patient tolerated treatment well Patient left: in chair;with call bell/phone within reach;with chair alarm set Nurse Communication: Mobility status PT Visit Diagnosis: Hemiplegia and hemiparesis Hemiplegia - Right/Left: Right Hemiplegia - caused by: Cerebral infarction    Time: 5621-3086 PT Time Calculation (min) (ACUTE ONLY): 37 min   Charges:   PT Evaluation $PT Eval Low Complexity: 1 Low   PT General Charges $$ ACUTE PT VISIT: 1 Visit           Jerolyn Center, PT Acute Rehabilitation Services  Office [phone]   Zena Amos 09/06/2023, 9:54 AM

## 2023-09-06 NOTE — Evaluation (Signed)
Occupational Therapy Evaluation Patient Details Name: Bailey [name] MRN: [id number] DOB: 1955-12-30 Today's Date: 09/06/2023   History of Present Illness 67 y.o. female sent over from [facility] after pt had tremors that staff thought were seizures. EEG negative for seizure PMH includes: HTN, HLD, DM II, L MCA CVA with aphasia, and tobacco use.   Clinical Impression   Pt unable to provide PLOF due to aphasia, from [facility], per therapy notes pt was mod A +2 in 05/2022 for transfers, and was max-total A for ADLs. Pt currently needing min-max A for ADLs, max +2 for bed mobility and mod +2 for stand pivot transfer to chair. Pt with R sided deficits, incr tone noted in RUE and pt keeps RUE in guarded position, and reports pain with PROM attempts. Pt presenting with impairments listed below, will follow acutely. Patient will benefit from continued inpatient follow up therapy, <3 hours/day to maximize safety/ind with ADLs/functional mobility.       If plan is discharge home, recommend the following: Two people to help with walking and/or transfers;A lot of help with bathing/dressing/bathroom;Assistance with cooking/housework;Direct supervision/assist for medications management;Direct supervision/assist for financial management;Assist for transportation;Help with stairs or ramp for entrance    Functional Status Assessment  Patient has had a recent decline in their functional status and demonstrates the ability to make significant improvements in function in a reasonable and predictable amount of time.  Equipment Recommendations  Other (comment) (defer)    Recommendations for Other Services       Precautions / Restrictions Precautions Precautions: Fall;Other (comment) Precaution Comments: R hemiparesis Restrictions Weight Bearing Restrictions Per Provider Order: No      Mobility Bed Mobility Overal bed mobility: Needs Assistance Bed Mobility: Supine to Sit     Supine to  sit: Max assist, +2 for physical assistance, HOB elevated, Used rails          Transfers Overall transfer level: Needs assistance Equipment used: 2 person hand held assist Transfers: Sit to/from Stand, Bed to chair/wheelchair/BSC Sit to Stand: Mod assist, +2 physical assistance     Step pivot transfers: Mod assist, +2 physical assistance            Balance Overall balance assessment: Needs assistance Sitting-balance support: No upper extremity supported, Feet supported Sitting balance-Leahy Scale: Fair     Standing balance support: Single extremity supported Standing balance-Leahy Scale: Poor                             ADL either performed or assessed with clinical judgement   ADL Overall ADL's : Needs assistance/impaired Eating/Feeding: Moderate assistance   Grooming: Minimal assistance;Moderate assistance   Upper Body Bathing: Moderate assistance   Lower Body Bathing: Maximal assistance   Upper Body Dressing : Moderate assistance   Lower Body Dressing: Maximal assistance   Toilet Transfer: Moderate assistance;+2 for physical assistance   Toileting- Clothing Manipulation and Hygiene: Moderate assistance       Functional mobility during ADLs: Moderate assistance;+2 for physical assistance       Vision   Additional Comments: will further assess, pt has difficutly following commands for full visual assessment, tracks R/L in visual fields     Perception         Praxis         Pertinent Vitals/Pain Pain Assessment Pain Assessment: Faces Pain Score: 6  Faces Pain Scale: Hurts even more Pain Location: RUE with PROM Pain Descriptors /  Indicators: Grimacing Pain Intervention(s): Monitored during session, Limited activity within patient's tolerance, Repositioned     Extremity/Trunk Assessment Upper Extremity Assessment Upper Extremity Assessment: RUE deficits/detail RUE Deficits / Details: incr tone distally vs proximally, pt keeps  very guarded and grimaces with ROM, wrist contracted in flexed position and keeps digits in fist. No AROM noted. . Pt does not let therapist elevate RUE on pillow for support   Lower Extremity Assessment Lower Extremity Assessment: Defer to PT evaluation RLE Deficits / Details: +extensor tone in standing; able to bear weight without buckling when stepping LLE; seemed to indicate pain in RLE prior to mobility (rubbing rt knee), however no signs of pain during mobility   Cervical / Trunk Assessment Cervical / Trunk Assessment: Other exceptions Cervical / Trunk Exceptions: shortening on rt side with rt hemi-pelvis elevated in standing   Communication Communication Communication: Difficulty following commands/understanding;Difficulty communicating thoughts/reduced clarity of speech Following commands: Follows one step commands consistently Cueing Techniques: Verbal cues;Gestural cues   Cognition Arousal: Alert Behavior During Therapy: WFL for tasks assessed/performed Overall Cognitive Status: Difficult to assess                                 General Comments: following most commands with L side     General Comments  VSS    Exercises     Shoulder Instructions      Home Living Family/patient expects to be discharged to:: Skilled nursing facility                                 Additional Comments: pt from [facility]      Prior Functioning/Environment Prior Level of Function : Needs assist             Mobility Comments: per last therapy notes (05/2022) pt +2 mod assist for transfers ADLs Comments: per last therapy notes 05/2022, pt was max-total for ADLs        OT Problem List: Decreased strength;Decreased range of motion;Decreased activity tolerance;Impaired balance (sitting and/or standing);Decreased cognition;Decreased safety awareness;Impaired tone;Impaired sensation;Impaired UE functional use;Decreased coordination      OT  Treatment/Interventions: Self-care/ADL training;Therapeutic exercise;Energy conservation;DME and/or AE instruction;Therapeutic activities;Patient/family education;Balance training    OT Goals(Current goals can be found in the care plan section) Acute Rehab OT Goals Patient Stated Goal: none stated OT Goal Formulation: With patient Time For Goal Achievement: 09/20/23 Potential to Achieve Goals: Good ADL Goals Pt Will Perform Upper Body Dressing: with min assist;sitting Pt Will Perform Lower Body Dressing: with mod assist;sit to/from stand;sitting/lateral leans Pt Will Transfer to Toilet: with min assist;bedside commode;stand pivot transfer;squat pivot transfer Additional ADL Goal #1: pt will perform bed mobility min A in prep for ADLs  OT Frequency: Min 1X/week    Co-evaluation PT/OT/SLP Co-Evaluation/Treatment: Yes Reason for Co-Treatment: Complexity of the patient's impairments (multi-system involvement);Necessary to address cognition/behavior during functional activity;To address functional/ADL transfers   OT goals addressed during session: ADL's and self-care      AM-PAC OT "6 Clicks" Daily Activity     Outcome Measure Help from another person eating meals?: A Lot Help from another person taking care of personal grooming?: A Lot Help from another person toileting, which includes using toliet, bedpan, or urinal?: A Lot Help from another person bathing (including washing, rinsing, drying)?: A Lot Help from another person to put on and taking off regular  upper body clothing?: A Lot Help from another person to put on and taking off regular lower body clothing?: A Lot 6 Click Score: 12   End of Session Nurse Communication: Mobility status  Activity Tolerance: Patient tolerated treatment well Patient left: in chair;with call bell/phone within reach;with chair alarm set  OT Visit Diagnosis: Unsteadiness on feet (R26.81);Other abnormalities of gait and mobility (R26.89);Muscle weakness  (generalized) (M62.81);Hemiplegia and hemiparesis Hemiplegia - Right/Left: Right Hemiplegia - caused by: Cerebral infarction                Time: 1610-9604 OT Time Calculation (min): 22 min Charges:  OT General Charges $OT Visit: 1 Visit OT Evaluation $OT Eval Moderate Complexity: 1 Mod  Jordan Pardini K, OTD, OTR/L SecureChat Preferred Acute Rehab [phone]   Carver Fila Koonce 09/06/2023, 12:50 PM

## 2023-09-06 NOTE — Progress Notes (Signed)
PROGRESS NOTE    Bailey [name]  ZOX:[id number] DOB: 04/13/1956 DOA: 09/04/2023 PCP: Cyndia Skeeters, MD    Chief Complaint  Patient presents with   Seizures    Brief Narrative:  67 y.o. female with medical history significant of HTNM HLD, T2DM, CVA with aphasia sent over from [facility] after pt had tremors that staff thought were seizures.    Staff at facility reported 3 consecutive full body shaking episodes. First one lasted for 30 secs, then 1 min for the second and 2 mins for the third. Pt normally incontinent so unable to tell if she had loss of bowel or bladder control. Pt had only been complaining of right sided facial pain. Pt can only say "sort of sort of" and reportedly rubs her hand around right face and jaw.  No hx of prior seizures and no new medication that family is aware of. Family reported she was somewhat groggy when she was first brought to ED but now back to baseline.   Assessment & Plan:   Principal Problem:   Seizure-like activity (HCC) Active Problems:   Type 2 diabetes mellitus treated without insulin (HCC)   Hyperlipidemia   Essential hypertension   Anemia   SIRS (systemic inflammatory response syndrome) (HCC)   History of CVA (cerebrovascular accident)   #1 seizure-like activity, POA -Patient was admitted with tremors diffusely felt rigors versus true seizures. -CT head done on admission negative, patient with no focal neurological deficits. -Patient underwent EEG that showed cortical dysfunction from the left hemisphere consistent with prior CVA, otherwise no seizures or epileptiform discharges were seen throughout the recording. -Patient currently without any further tremors noted. -Dr.Chiu discussed with neurology and felt no convincing evidence to start AED now recommended to monitor patient and if patient has another spell then involve neurology formally for considerations of AED at that time. -Supportive care.  2.  History of  CVA -Patient with aphasia secondary to CVA and answers so-so to most questions. -Patient with a right-sided hemiparesis. -Continue aspirin, Brilinta, statin for secondary stroke prophylaxis. -PT/OT.  3.  SIRS, POA -Patient noted to have met criteria with tachycardia, tachypnea and leukocytosis on admission however unclear source. -Urinalysis overall not convincing of UTI. -Patient status post empiric IV Rocephin. -Patient with some complaints of right-sided facial pain with dental caries noted, - Panorex with poor dentition noted. -Currently afebrile. -Monitor off antibiotics.  4.  Anemia -Patient noted to have a hemoglobin of 10 in March which is slowly trending down to 8.6 in September. -Patient noted with a hemoglobin currently stable at 7.8. -Anemia panel with iron level of 17, TIBC of 379. -Status post IV iron x 1 09/05/2023. -Follow H&H. -Transfusion threshold hemoglobin < 7.  5.  Hypertension -Continue Norvasc.  6.  Hyperlipidemia -Continue statin.  7.  Diabetes mellitus type 2 -Hemoglobin A1c 7.0 (09/04/2023) -CBG 186 this morning. -Noted to be on Lantus 30 units daily. -Place on half home dose of Lantus 15 units daily. -SSI. -Continue Neurontin.   DVT prophylaxis: On Brilinta/SCDs Code Status: Full Family Communication: No family at bedside. Disposition: Likely back to skilled nursing facility.  Status is: Observation The patient remains OBS appropriate and will d/c before 2 midnights.   Consultants:  None  Procedures:  CT head 09/04/2023 Orthopantogram 09/04/2023 Chest x-ray 09/04/2023 EEG 09/04/2023  Antimicrobials:  Anti-infectives (From admission, onward)    Start     Dose/Rate Route Frequency Ordered Stop   09/05/23 1000  cefTRIAXone (ROCEPHIN) 1 g  in sodium chloride 0.9 % 100 mL IVPB  Status:  Discontinued        1 g 200 mL/hr over 30 Minutes Intravenous Every 24 hours 09/04/23 2049 09/05/23 0819   09/04/23 1715  cefTRIAXone (ROCEPHIN) 1 g  in sodium chloride 0.9 % 100 mL IVPB        1 g 200 mL/hr over 30 Minutes Intravenous  Once 09/04/23 1710 09/04/23 1800         Subjective: Patient sitting In chair and answers so-so to every question.  Seems comfortable.  No tremors noted.  Objective: Vitals:   09/06/23 0331 09/06/23 0759 09/06/23 1130 09/06/23 1604  BP: (!) 129/57 (!) 156/62 (!) 145/65 (!) 129/40  Pulse: 93 95 100 99  Resp: 14 17 18 18   Temp: 97.7 F (36.5 C) 98.2 F (36.8 C) 98.4 F (36.9 C) 98.9 F (37.2 C)  TempSrc: Oral Oral Oral Oral  SpO2: 97% 99% 100% 100%  Weight:      Height:        Intake/Output Summary (Last 24 hours) at 09/06/2023 1837 Last data filed at 09/06/2023 1814 Gross per 24 hour  Intake 354 ml  Output 1125 ml  Net -771 ml   Filed Weights   09/04/23 1525  Weight: 79.4 kg    Examination:  General exam: Appears calm and comfortable  Respiratory system: Clear to auscultation.  No wheezes, no crackles, no rhonchi.  Fair air movement.  Speaking in full sentences.  Respiratory effort normal. Cardiovascular system: S1 & S2 heard, RRR. No JVD, murmurs, rubs, gallops or clicks. No pedal edema. Gastrointestinal system: Abdomen is nondistended, soft and nontender. No organomegaly or masses felt. Normal bowel sounds heard. Central nervous system: Alert and oriented. No focal neurological deficits. Extremities: Symmetric 5 x 5 power. Skin: No rashes, lesions or ulcers Psychiatry: Judgement and insight appear normal. Mood & affect appropriate.     Data Reviewed: I have personally reviewed following labs and imaging studies  CBC: Recent Labs  Lab 09/04/23 1610 09/04/23 1637 09/05/23 0721 09/06/23 0600  WBC 12.7*  --  9.5 11.1*  NEUTROABS 9.5*  --   --   --   HGB 8.6* 9.9* 7.8* 7.8*  HCT 29.0* 29.0* 26.4* 26.3*  MCV 63.5*  --  62.7* 62.8*  PLT 391  --  364 352    Basic Metabolic Panel: Recent Labs  Lab 09/04/23 1610 09/04/23 1637 09/05/23 0721 09/06/23 0600  NA 135  140 138 137  K 4.0 4.1 4.0 4.0  CL 105 106 107 106  CO2 21*  --  24 23  GLUCOSE 231* 236* 179* 183*  BUN 10 10 9 9   CREATININE 0.69 0.60 0.91 0.61  CALCIUM 9.3  --  9.2 9.3    GFR: Estimated Creatinine Clearance: 70.1 mL/min (by C-G formula based on SCr of 0.61 mg/dL).  Liver Function Tests: Recent Labs  Lab 09/04/23 1610 09/06/23 0600  AST 24 17  ALT 24 19  ALKPHOS 94 77  BILITOT 0.4 0.3  PROT 7.1 6.6  ALBUMIN 3.2* 3.0*    CBG: Recent Labs  Lab 09/05/23 1608 09/05/23 2129 09/06/23 0611 09/06/23 1130 09/06/23 1606  GLUCAP 253* 142* 186* 234* 251*     Recent Results (from the past 240 hours)  Culture, blood (routine x 2)     Status: None (Preliminary result)   Collection Time: 09/04/23  4:08 PM   Specimen: BLOOD LEFT FOREARM  Result Value Ref Range Status   Specimen Description  BLOOD LEFT FOREARM  Final   Special Requests   Final    BOTTLES DRAWN AEROBIC AND ANAEROBIC Blood Culture results may not be optimal due to an inadequate volume of blood received in culture bottles   Culture   Final    NO GROWTH 2 DAYS Performed at [facility], [address] [place], [place] [address]    Report Status PENDING  Incomplete  Culture, blood (routine x 2)     Status: None (Preliminary result)   Collection Time: 09/04/23  4:09 PM   Specimen: BLOOD  Result Value Ref Range Status   Specimen Description BLOOD SITE NOT SPECIFIED  Final   Special Requests   Final    BOTTLES DRAWN AEROBIC AND ANAEROBIC Blood Culture results may not be optimal due to an inadequate volume of blood received in culture bottles   Culture   Final    NO GROWTH 2 DAYS Performed at [facility], [address], [place], [place] [address]    Report Status PENDING  Incomplete  Resp panel by RT-PCR (RSV, Flu A&B, Covid) Anterior Nasal Swab     Status: None   Collection Time: 09/04/23  4:19 PM   Specimen: Anterior Nasal Swab  Result Value Ref Range Status   SARS Coronavirus 2 by RT PCR  NEGATIVE NEGATIVE Final   Influenza A by PCR NEGATIVE NEGATIVE Final   Influenza B by PCR NEGATIVE NEGATIVE Final    Comment: (NOTE) The Xpert Xpress SARS-CoV-2/FLU/RSV plus assay is intended as an aid in the diagnosis of influenza from Nasopharyngeal swab specimens and should not be used as a sole basis for treatment. Nasal washings and aspirates are unacceptable for Xpert Xpress SARS-CoV-2/FLU/RSV testing.  Fact Sheet for Patients: BloggerCourse.com  Fact Sheet for Healthcare Providers: SeriousBroker.it  This test is not yet approved or cleared by the [place] FDA and has been authorized for detection and/or diagnosis of SARS-CoV-2 by FDA under an Emergency Use Authorization (EUA). This EUA will remain in effect (meaning this test can be used) for the duration of the COVID-19 declaration under Section 564(b)(1) of the Act, 21 U.S.C. section 360bbb-3(b)(1), unless the authorization is terminated or revoked.     Resp Syncytial Virus by PCR NEGATIVE NEGATIVE Final    Comment: (NOTE) Fact Sheet for Patients: BloggerCourse.com  Fact Sheet for Healthcare Providers: SeriousBroker.it  This test is not yet approved or cleared by the [place] FDA and has been authorized for detection and/or diagnosis of SARS-CoV-2 by FDA under an Emergency Use Authorization (EUA). This EUA will remain in effect (meaning this test can be used) for the duration of the COVID-19 declaration under Section 564(b)(1) of the Act, 21 U.S.C. section 360bbb-3(b)(1), unless the authorization is terminated or revoked.  Performed at [facility], [address] [place], [place] [address]   Urine Culture (for pregnant, neutropenic or urologic patients or patients with an indwelling urinary catheter)     Status: None (Preliminary result)   Collection Time: 09/04/23  8:27 PM   Specimen: Urine, Clean  Catch  Result Value Ref Range Status   Specimen Description URINE, CLEAN CATCH  Final   Special Requests NONE  Final   Culture   Final    CULTURE REINCUBATED FOR BETTER GROWTH Performed at [facility], [address], [place], [place] [address]    Report Status PENDING  Incomplete         Radiology Studies: EEG adult Result Date: 09/05/2023  Charlsie Quest, MD     09/05/2023 10:15 AM Patient Name: Bailey [name] MRN: [id number] Epilepsy Attending: Charlsie Quest Referring Physician/Provider: Anselm Jungling, DO Date: 09/05/2023 Duration: 26.57 mins Patient history:  67 y.o. female with medical history significant of HTNM HLD, T2DM, CVA with aphasia sent over from [facility] after pt had tremors that staff thought were seizures. EEG to evaluate for seizure Level of alertness: Awake, asleep AEDs during EEG study: GBP Technical aspects: This EEG study was done with scalp electrodes positioned according to the 10-20 International system of electrode placement. Electrical activity was reviewed with band pass filter of 1-70Hz , sensitivity of 7 uV/mm, display speed of 39mm/sec with a 60Hz  notched filter applied as appropriate. EEG data were recorded continuously and digitally stored.  Video monitoring was available and reviewed as appropriate. Description: The posterior dominant rhythm consists of 9 Hz activity of moderate voltage (25-35 uV) seen predominantly in posterior head regions, asymmetric ( left<right) and reactive to eye opening and eye closing. Sleep was characterized by vertex waves, sleep spindles (12 to 14 Hz), maximal frontocentral region. EEG showed continuous 3 to 5 Hz theta-delta slowing in left hemisphere. Hyperventilation and photic stimulation were not performed.   ABNORMALITY - Continuous slow,  left hemisphere IMPRESSION: This study is suggestive of cortical dysfunction arising from left hemisphere likely secondary to underlying stroke. No seizures or  epileptiform discharges were seen throughout the recording. Charlsie Quest   DG Orthopantogram Result Date: 09/04/2023 CLINICAL DATA:  Jaw pain EXAM: ORTHOPANTOGRAM/PANORAMIC COMPARISON:  None Available. FINDINGS: The midline mandible and maxilla are out of plane and not well assessed on this examination. Over penetration limits evaluation of the left mandibular body. The there are numerous absent teeth including all molars with the residual roots teeth 3 and likely 15 identified with erosion of the crowns. Similarly, the crown of tooth 29 is largely eroded. There is periodontal erosion likely involving tooth 8 at the base though this is in the area out of plane imaging and is not well assessed on this examination. No periapical lucencies are identified. No lytic or blastic and tibial ir or maxillary lesions are identified. No fracture or mandibular dislocation noted. IMPRESSION: 1. Numerous absent teeth including all molars with the residual roots of teeth 3 and likely 15 identified with erosion of the crowns. Similarly, the crown of tooth 29 is largely eroded. There is periodontal erosion likely involving tooth 8 at the base though this is in the area out of plane imaging and is not well assessed on this examination. Electronically Signed   By: Helyn Numbers M.D.   On: 09/04/2023 21:42        Scheduled Meds:  amLODipine  2.5 mg Oral Daily   aspirin  81 mg Oral Daily   atorvastatin  40 mg Oral QHS   cholecalciferol  1,000 Units Oral QHS   gabapentin  100 mg Oral QHS   insulin aspart  0-15 Units Subcutaneous TID PC & HS   omega-3 acid ethyl esters  1 g Oral QHS   oxybutynin  5 mg Oral Daily   pantoprazole  40 mg Oral BID   senna-docusate  2 tablet Oral QHS   sertraline  100 mg Oral Daily   ticagrelor  90 mg Oral BID   Continuous Infusions:   LOS: 0 days    Time spent: 35 minutes    Ramiro Harvest, MD [facility]   To contact the attending provider between 7A-7P or  the  covering provider during after hours 7P-7A, please log into the web site [email or web address] and access using universal [place] password for that web site. If you do not have the password, please call the hospital operator.  09/06/2023, 6:37 PM

## 2023-09-07 ENCOUNTER — Inpatient Hospital Stay (HOSPITAL_COMMUNITY): Payer: Medicare HMO

## 2023-09-07 DIAGNOSIS — Z794 Long term (current) use of insulin: Secondary | ICD-10-CM | POA: Diagnosis not present

## 2023-09-07 DIAGNOSIS — Z91014 Allergy to mammalian meats: Secondary | ICD-10-CM | POA: Diagnosis not present

## 2023-09-07 DIAGNOSIS — I10 Essential (primary) hypertension: Secondary | ICD-10-CM | POA: Diagnosis present

## 2023-09-07 DIAGNOSIS — J449 Chronic obstructive pulmonary disease, unspecified: Secondary | ICD-10-CM | POA: Diagnosis present

## 2023-09-07 DIAGNOSIS — R253 Fasciculation: Secondary | ICD-10-CM | POA: Diagnosis present

## 2023-09-07 DIAGNOSIS — R569 Unspecified convulsions: Secondary | ICD-10-CM | POA: Diagnosis present

## 2023-09-07 DIAGNOSIS — Z7982 Long term (current) use of aspirin: Secondary | ICD-10-CM | POA: Diagnosis not present

## 2023-09-07 DIAGNOSIS — R159 Full incontinence of feces: Secondary | ICD-10-CM | POA: Diagnosis present

## 2023-09-07 DIAGNOSIS — Z7902 Long term (current) use of antithrombotics/antiplatelets: Secondary | ICD-10-CM | POA: Diagnosis not present

## 2023-09-07 DIAGNOSIS — Z1152 Encounter for screening for COVID-19: Secondary | ICD-10-CM | POA: Diagnosis not present

## 2023-09-07 DIAGNOSIS — D649 Anemia, unspecified: Secondary | ICD-10-CM | POA: Diagnosis not present

## 2023-09-07 DIAGNOSIS — K219 Gastro-esophageal reflux disease without esophagitis: Secondary | ICD-10-CM | POA: Diagnosis present

## 2023-09-07 DIAGNOSIS — R000 Tachycardia, unspecified: Secondary | ICD-10-CM | POA: Diagnosis present

## 2023-09-07 DIAGNOSIS — I69320 Aphasia following cerebral infarction: Secondary | ICD-10-CM | POA: Diagnosis not present

## 2023-09-07 DIAGNOSIS — F17210 Nicotine dependence, cigarettes, uncomplicated: Secondary | ICD-10-CM | POA: Diagnosis present

## 2023-09-07 DIAGNOSIS — D72829 Elevated white blood cell count, unspecified: Secondary | ICD-10-CM | POA: Diagnosis present

## 2023-09-07 DIAGNOSIS — D509 Iron deficiency anemia, unspecified: Secondary | ICD-10-CM | POA: Diagnosis present

## 2023-09-07 DIAGNOSIS — E785 Hyperlipidemia, unspecified: Secondary | ICD-10-CM | POA: Diagnosis present

## 2023-09-07 DIAGNOSIS — Z833 Family history of diabetes mellitus: Secondary | ICD-10-CM | POA: Diagnosis not present

## 2023-09-07 DIAGNOSIS — Z79899 Other long term (current) drug therapy: Secondary | ICD-10-CM | POA: Diagnosis not present

## 2023-09-07 DIAGNOSIS — I69351 Hemiplegia and hemiparesis following cerebral infarction affecting right dominant side: Secondary | ICD-10-CM | POA: Diagnosis not present

## 2023-09-07 DIAGNOSIS — R6510 Systemic inflammatory response syndrome (SIRS) of non-infectious origin without acute organ dysfunction: Secondary | ICD-10-CM | POA: Diagnosis not present

## 2023-09-07 DIAGNOSIS — Z7983 Long term (current) use of bisphosphonates: Secondary | ICD-10-CM | POA: Diagnosis not present

## 2023-09-07 DIAGNOSIS — K029 Dental caries, unspecified: Secondary | ICD-10-CM | POA: Diagnosis present

## 2023-09-07 DIAGNOSIS — E119 Type 2 diabetes mellitus without complications: Secondary | ICD-10-CM | POA: Diagnosis not present

## 2023-09-07 DIAGNOSIS — E1165 Type 2 diabetes mellitus with hyperglycemia: Secondary | ICD-10-CM | POA: Diagnosis present

## 2023-09-07 DIAGNOSIS — R4182 Altered mental status, unspecified: Secondary | ICD-10-CM | POA: Diagnosis not present

## 2023-09-07 DIAGNOSIS — R32 Unspecified urinary incontinence: Secondary | ICD-10-CM | POA: Diagnosis present

## 2023-09-07 LAB — GLUCOSE, CAPILLARY
Glucose-Capillary: 188 mg/dL — ABNORMAL HIGH (ref 70–99)
Glucose-Capillary: 294 mg/dL — ABNORMAL HIGH (ref 70–99)
Glucose-Capillary: 209 mg/dL — ABNORMAL HIGH (ref 70–99)
Glucose-Capillary: 270 mg/dL — ABNORMAL HIGH (ref 70–99)
Glucose-Capillary: 269 mg/dL — ABNORMAL HIGH (ref 70–99)

## 2023-09-07 LAB — BASIC METABOLIC PANEL
Anion gap: 7 (ref 5–15)
BUN: 16 mg/dL (ref 8–23)
CO2: 22 mmol/L (ref 22–32)
Calcium: 8.8 mg/dL — ABNORMAL LOW (ref 8.9–10.3)
Chloride: 106 mmol/L (ref 98–111)
Creatinine, Ser: 0.7 mg/dL (ref 0.44–1.00)
GFR, Estimated: >60 mL/min (ref >60)
Glucose, Bld: 202 mg/dL — ABNORMAL HIGH (ref 70–99)
Potassium: 4.2 mmol/L (ref 3.5–5.1)
Sodium: 135 mmol/L (ref 135–145)

## 2023-09-07 LAB — CBC
HCT: 25.4 % — ABNORMAL LOW (ref 36.0–46.0)
Hemoglobin: 7.6 g/dL — ABNORMAL LOW (ref 12.0–15.0)
MCH: 18.9 pg — ABNORMAL LOW (ref 26.0–34.0)
MCHC: 29.9 g/dL — ABNORMAL LOW (ref 30.0–36.0)
MCV: 63 fL — ABNORMAL LOW (ref 80.0–100.0)
Platelets: 339 10*3/uL (ref 150–400)
RBC: 4.03 MIL/uL (ref 3.87–5.11)
RDW: 20.9 % — ABNORMAL HIGH (ref 11.5–15.5)
WBC: 12.5 10*3/uL — ABNORMAL HIGH (ref 4.0–10.5)
nRBC: 0.3 % — ABNORMAL HIGH (ref 0.0–0.2)

## 2023-09-07 LAB — MAGNESIUM: Magnesium: 1.8 mg/dL (ref 1.7–2.4)

## 2023-09-07 LAB — URINE CULTURE

## 2023-09-07 MED ORDER — SODIUM CHLORIDE 0.9 % IV BOLUS
1000.0000 mL | INTRAVENOUS | Status: AC
  Administered 2023-09-07: 1000 mL via INTRAVENOUS

## 2023-09-07 MED ORDER — METOPROLOL TARTRATE 5 MG/5ML IV SOLN: 2.5000 mg | INTRAVENOUS | Status: DC | PRN

## 2023-09-07 MED ORDER — LORAZEPAM 2 MG/ML IJ SOLN: 2.0000 mg | INTRAMUSCULAR | Status: DC | PRN

## 2023-09-07 MED ORDER — LEVETIRACETAM IN NACL 500 MG/100ML IV SOLN
500.0000 mg | Freq: Two times a day (BID) | INTRAVENOUS | Status: DC
  Administered 2023-09-08: 500 mg via INTRAVENOUS
  Filled 2023-09-07: qty 100

## 2023-09-07 MED ORDER — INSULIN GLARGINE-YFGN 100 UNIT/ML ~~LOC~~ SOLN
20.0000 [IU] | Freq: Every day | SUBCUTANEOUS | Status: DC
Start: 2023-09-07 — End: 2023-09-10
  Administered 2023-09-07 – 2023-09-10 (×4): 20 [IU] via SUBCUTANEOUS
  Filled 2023-09-07 (×4): qty 0.2

## 2023-09-07 MED ORDER — LEVETIRACETAM IN NACL 1500 MG/100ML IV SOLN
1500.0000 mg | Freq: Once | INTRAVENOUS | Status: AC
  Administered 2023-09-07: 1500 mg via INTRAVENOUS
  Filled 2023-09-07: qty 100

## 2023-09-07 NOTE — Progress Notes (Signed)
Patient flagged yellow MEWS with heart rate at 125 while resting, rechecked after few minutes and shows 109 bpm, notified Dr. Janalyn Shy, EKG and bolus ordered.updated [employer].   09/07/23 2003 09/07/23 2007 09/07/23 2011  Assess: MEWS Score  Temp 98.5 F (36.9 C)  --   --   BP (!) 150/53  --   --   MAP (mmHg) 81  --   --   Pulse Rate (!) 125  --   --   ECG Heart Rate  --   --  (!) 109  Resp  --   --   --   Level of Consciousness  --  Alert  --   SpO2 100 %  --   --   O2 Device Room Air  --   --   Assess: MEWS Score  MEWS Temp 0 0 0  MEWS Systolic 0 0 0  MEWS Pulse 2 2 1   MEWS RR 0 0 0  MEWS LOC 0 0 0  MEWS Score 2 2 1   MEWS Score Color Yellow Yellow Green  Assess: if the MEWS score is Yellow or Red  Were vital signs accurate and taken at a resting state? Yes  --   --   Does the patient meet 2 or more of the SIRS criteria? No  --   --   MEWS guidelines implemented  Yes, yellow  --   --   Treat  MEWS Interventions Considered administering scheduled or prn medications/treatments as ordered  --   --   Take Vital Signs  Increase Vital Sign Frequency  Yellow: Q2hr x1, continue Q4hrs until patient remains green for 12hrs  --   --   Escalate  MEWS: Escalate Yellow: Discuss with charge nurse and consider notifying provider and/or RRT  --   --   Notify: Charge Nurse/RN  Name of Charge Nurse/RN Notified Health visitor  --   --   Provider Notification  Provider Name/Title Dr. Janalyn Shy  --   --   Date Provider Notified 09/07/23  --   --   Time Provider Notified 2010  --   --   Method of Notification Page  --   --   Notification Reason  (yellow mews)  --   --   Provider response See new orders  --   --   Date of Provider Response 09/07/23  --   --   Time of Provider Response 2011  --   --   Assess: SIRS CRITERIA  SIRS Temperature  0  --  0  SIRS Respirations  0  --  0  SIRS Pulse 1  --  1  SIRS WBC 0  --  0  SIRS Score Sum  1  --  1    09/07/23 2100  Assess: MEWS Score  Temp 98.6 F  (37 C)  BP (!) 149/52  MAP (mmHg)  --   Pulse Rate  --   ECG Heart Rate (!) 108  Resp 18  Level of Consciousness  --   SpO2 100 %  O2 Device Room Air  Assess: MEWS Score  MEWS Temp 0  MEWS Systolic 0  MEWS Pulse 1  MEWS RR 0  MEWS LOC 0  MEWS Score 1  MEWS Score Color Green  Assess: if the MEWS score is Yellow or Red  Were vital signs accurate and taken at a resting state?  --   Does the patient meet 2 or more of the SIRS criteria?  --  MEWS guidelines implemented   --   Treat  MEWS Interventions  --   Take Vital Signs  Increase Vital Sign Frequency   --   Escalate  MEWS: Escalate  --   Notify: Charge Nurse/RN  Name of Charge Nurse/RN Notified  --   Provider Notification  Provider Name/Title  --   Date Provider Notified  --   Time Provider Notified  --   Method of Notification  --   Notification Reason  --   Provider response  --   Date of Provider Response  --   Time of Provider Response  --   Assess: SIRS CRITERIA  SIRS Temperature  0  SIRS Respirations  0  SIRS Pulse 1  SIRS WBC 0  SIRS Score Sum  1

## 2023-09-07 NOTE — Progress Notes (Signed)
LTM maint computers switch out, ethernet wifi not working in the patient room.  Tech will check on IT service request.  Patient is not monitored by [facility].

## 2023-09-07 NOTE — Progress Notes (Signed)
Persistent sinus tachycardia: Nurse reported that patient having persistent sinus tachycardia heart rate up to 127.  Blood pressure is borderline soft.  Per chart review patient has been admitted for seizure-like activity and concern for SIRS in the setting of dental infection.  Currently afebrile. - Giving 1 L of NS bolus, holding blood pressure regimen and ordered Lopressor 2.5 mg x 3 doses for heart rate above 120 with holding parameters.

## 2023-09-07 NOTE — Progress Notes (Signed)
LTM maint complete - no skin breakdown seen Ethernet/wifi not holding connection. Patient is not monitored by [facility] t this time.Nurse notified , IT contacted.

## 2023-09-07 NOTE — Progress Notes (Signed)
LTM EEG hooked up and running - no initial skin breakdown - push button tested - [facility] monitoring.  

## 2023-09-07 NOTE — Plan of Care (Signed)
  Problem: Fluid Volume: Goal: Ability to maintain a balanced intake and output will improve Outcome: Not Progressing   Problem: Metabolic: Goal: Ability to maintain appropriate glucose levels will improve Outcome: Not Progressing   Problem: Skin Integrity: Goal: Risk for impaired skin integrity will decrease Outcome: Not Progressing   Problem: Clinical Measurements: Goal: Diagnostic test results will improve Outcome: Not Progressing   Problem: Safety: Goal: Ability to remain free from injury will improve Outcome: Not Progressing

## 2023-09-07 NOTE — Progress Notes (Signed)
1000-1030 This RN and Mimi, NT at bedside. Pt transferred to from bed to stedy. In stedy, pt appearing to have seizure like activity, gazing into space, no longer following commands. Episode lasted approximately a minute and a half. Pt responds to RN. RN asks pt if she feels okay, pt nods head to indicate yes. Transfer pt back to bed from stedy. Pt appearing to have second episode of seizure like activity, gazing into space, moving lips, not following commands. Pt had an episode of emesis and incontinence. MD made aware. See new orders.

## 2023-09-07 NOTE — Plan of Care (Signed)

## 2023-09-07 NOTE — Progress Notes (Signed)
PROGRESS NOTE    Ruther Single  FAO:[id number] DOB: April 28, 1956 DOA: 09/04/2023 PCP: Cyndia Skeeters, MD    Chief Complaint  Patient presents with   Seizures    Brief Narrative:  67 y.o. female with medical history significant of HTNM HLD, T2DM, CVA with aphasia sent over from [facility] after pt had tremors that staff thought were seizures.    Staff at facility reported 3 consecutive full body shaking episodes. First one lasted for 30 secs, then 1 min for the second and 2 mins for the third. Pt normally incontinent so unable to tell if she had loss of bowel or bladder control. Pt had only been complaining of right sided facial pain. Pt can only say "sort of sort of" and reportedly rubs her hand around right face and jaw.  No hx of prior seizures and no new medication that family is aware of. Family reported she was somewhat groggy when she was first brought to ED but now back to baseline.   Assessment & Plan:   Principal Problem:   Seizure-like activity (HCC) Active Problems:   Type 2 diabetes mellitus treated without insulin (HCC)   Hyperlipidemia   Essential hypertension   Anemia   SIRS (systemic inflammatory response syndrome) (HCC)   History of CVA (cerebrovascular accident)   #1 seizure-like activity, POA -Patient was admitted with tremors diffusely felt rigors versus true seizures. -CT head done on admission negative, patient with no focal neurological deficits. -Patient underwent EEG that showed cortical dysfunction from the left hemisphere consistent with prior CVA, otherwise no seizures or epileptiform discharges were seen throughout the recording. -Patient noted this morning to have some seizure-like episodes x 2 await left gaze, some facial twitching per RN with some urinary incontinence. -Dr.Chiu discussed with neurology and felt no convincing evidence to start AED now recommended to monitor patient and if patient has another spell then involve  neurology formally for considerations of AED at that time. -Due to seizure-like episodes x 2 this morning will formally consult with neurology for further evaluation and management. -IV Ativan as needed. -Supportive care.  2.  History of CVA -Patient with aphasia secondary to CVA and answers so-so to most questions. -Patient with a right-sided hemiparesis. -Continue aspirin, Brilinta, statin for secondary stroke prophylaxis. -PT/OT.  3.  SIRS, POA -Patient noted to have met criteria with tachycardia, tachypnea and leukocytosis on admission however unclear source. -Urinalysis overall not convincing of UTI. -Patient status post empiric IV Rocephin. -Patient with some complaints of right-sided facial pain with dental caries noted, - Panorex with poor dentition noted. -Currently afebrile. -Monitor off antibiotics.  4.  Anemia -Patient noted to have a hemoglobin of 10 in March which is slowly trending down to 8.6 in September. -Patient noted with a hemoglobin currently stable at 7.6. -Anemia panel with iron level of 17, TIBC of 379. -Status post IV iron x 1 09/05/2023. -Follow H&H. -Transfusion threshold hemoglobin < 7.  5.  Hypertension -Continue Norvasc 2.5 mg daily.   6.  Hyperlipidemia -Statin.  7.  Diabetes mellitus type 2 -Hemoglobin A1c 7.0 (09/04/2023) -CBG 188 this morning. -Noted to be on Lantus 30 units daily. -Increase Semglee to 20 units daily.  -SSI. -Continue Neurontin.   DVT prophylaxis: On Brilinta/SCDs Code Status: Full Family Communication: No family at bedside. Disposition: Likely back to skilled nursing facility once medically stable and cleared by neurology.  Status is: Observation The patient remains OBS appropriate and will d/c before 2 midnights.  Consultants:  None  Procedures:  CT head 09/04/2023 Orthopantogram 09/04/2023 Chest x-ray 09/04/2023 EEG 09/04/2023  Antimicrobials:  Anti-infectives (From admission, onward)    Start      Dose/Rate Route Frequency Ordered Stop   09/05/23 1000  cefTRIAXone (ROCEPHIN) 1 g in sodium chloride 0.9 % 100 mL IVPB  Status:  Discontinued        1 g 200 mL/hr over 30 Minutes Intravenous Every 24 hours 09/04/23 2049 09/05/23 0819   09/04/23 1715  cefTRIAXone (ROCEPHIN) 1 g in sodium chloride 0.9 % 100 mL IVPB        1 g 200 mL/hr over 30 Minutes Intravenous  Once 09/04/23 1710 09/04/23 1800         Subjective: Patient laying in bed with padding around.  Patient noted to have had 2 seizure-like activities this morning per RN awaiting staring off/gazing off into space, with some lip twitching and head bobbing noted.  On second seizure-like episode per RN patient with some urinary incontinence.  Patient seems postictal on examination.   Objective: Vitals:   09/06/23 2353 09/07/23 0340 09/07/23 0347 09/07/23 0752  BP: (!) 129/54 (!) 125/57 (!) 127/53 (!) 123/49  Pulse: 96  99 (!) 105  Resp: 16 19 18    Temp: 98.5 F (36.9 C) 99 F (37.2 C) 97.7 F (36.5 C) 99.2 F (37.3 C)  TempSrc: Oral  Oral Oral  SpO2: 98% 100% 99% 99%  Weight:      Height:        Intake/Output Summary (Last 24 hours) at 09/07/2023 1033 Last data filed at 09/07/2023 0349 Gross per 24 hour  Intake --  Output 1550 ml  Net -1550 ml   Filed Weights   09/04/23 1525  Weight: 79.4 kg    Examination:  General exam: Postictal Respiratory system: Clear to auscultation anterior lung fields..  No wheezes, no crackles, no rhonchi.  Fair air movement.  Speaking in full sentences.  Respiratory effort normal. Cardiovascular system: RRR no murmurs rubs or gallops.  No JVD.  No lower extremity edema.   Gastrointestinal system: Abdomen is soft, nontender, nondistended, positive bowel sounds.  No rebound.  No guarding. Central nervous system: Alert and oriented. No focal neurological deficits. Extremities: Symmetric 5 x 5 power. Skin: No rashes, lesions or ulcers Psychiatry: Judgement and insight appear poor. Mood  & affect appropriate.     Data Reviewed: I have personally reviewed following labs and imaging studies  CBC: Recent Labs  Lab 09/04/23 1610 09/04/23 1637 09/05/23 0721 09/06/23 0600 09/07/23 0526  WBC 12.7*  --  9.5 11.1* 12.5*  NEUTROABS 9.5*  --   --   --   --   HGB 8.6* 9.9* 7.8* 7.8* 7.6*  HCT 29.0* 29.0* 26.4* 26.3* 25.4*  MCV 63.5*  --  62.7* 62.8* 63.0*  PLT 391  --  364 352 339    Basic Metabolic Panel: Recent Labs  Lab 09/04/23 1610 09/04/23 1637 09/05/23 0721 09/06/23 0600 09/07/23 0526  NA 135 140 138 137 135  K 4.0 4.1 4.0 4.0 4.2  CL 105 106 107 106 106  CO2 21*  --  24 23 22   GLUCOSE 231* 236* 179* 183* 202*  BUN 10 10 9 9 16   CREATININE 0.69 0.60 0.91 0.61 0.70  CALCIUM 9.3  --  9.2 9.3 8.8*    GFR: Estimated Creatinine Clearance: 70.1 mL/min (by C-G formula based on SCr of 0.7 mg/dL).  Liver Function Tests: Recent Labs  Lab 09/04/23 1610  09/06/23 0600  AST 24 17  ALT 24 19  ALKPHOS 94 77  BILITOT 0.4 0.3  PROT 7.1 6.6  ALBUMIN 3.2* 3.0*    CBG: Recent Labs  Lab 09/06/23 1130 09/06/23 1606 09/06/23 2115 09/07/23 0632 09/07/23 0926  GLUCAP 234* 251* 218* 188* 294*     Recent Results (from the past 240 hours)  Culture, blood (routine x 2)     Status: None (Preliminary result)   Collection Time: 09/04/23  4:08 PM   Specimen: BLOOD LEFT FOREARM  Result Value Ref Range Status   Specimen Description BLOOD LEFT FOREARM  Final   Special Requests   Final    BOTTLES DRAWN AEROBIC AND ANAEROBIC Blood Culture results may not be optimal due to an inadequate volume of blood received in culture bottles   Culture   Final    NO GROWTH 3 DAYS Performed at [facility], [address] [place], [place] [address]    Report Status PENDING  Incomplete  Culture, blood (routine x 2)     Status: None (Preliminary result)   Collection Time: 09/04/23  4:09 PM   Specimen: BLOOD  Result Value Ref Range Status   Specimen Description BLOOD  SITE NOT SPECIFIED  Final   Special Requests   Final    BOTTLES DRAWN AEROBIC AND ANAEROBIC Blood Culture results may not be optimal due to an inadequate volume of blood received in culture bottles   Culture   Final    NO GROWTH 3 DAYS Performed at [facility], [address] [place], [place] [address]    Report Status PENDING  Incomplete  Resp panel by RT-PCR (RSV, Flu A&B, Covid) Anterior Nasal Swab     Status: None   Collection Time: 09/04/23  4:19 PM   Specimen: Anterior Nasal Swab  Result Value Ref Range Status   SARS Coronavirus 2 by RT PCR NEGATIVE NEGATIVE Final   Influenza A by PCR NEGATIVE NEGATIVE Final   Influenza B by PCR NEGATIVE NEGATIVE Final    Comment: (NOTE) The Xpert Xpress SARS-CoV-2/FLU/RSV plus assay is intended as an aid in the diagnosis of influenza from Nasopharyngeal swab specimens and should not be used as a sole basis for treatment. Nasal washings and aspirates are unacceptable for Xpert Xpress SARS-CoV-2/FLU/RSV testing.  Fact Sheet for Patients: BloggerCourse.com  Fact Sheet for Healthcare Providers: SeriousBroker.it  This test is not yet approved or cleared by the [place] FDA and has been authorized for detection and/or diagnosis of SARS-CoV-2 by FDA under an Emergency Use Authorization (EUA). This EUA will remain in effect (meaning this test can be used) for the duration of the COVID-19 declaration under Section 564(b)(1) of the Act, 21 U.S.C. section 360bbb-3(b)(1), unless the authorization is terminated or revoked.     Resp Syncytial Virus by PCR NEGATIVE NEGATIVE Final    Comment: (NOTE) Fact Sheet for Patients: BloggerCourse.com  Fact Sheet for Healthcare Providers: SeriousBroker.it  This test is not yet approved or cleared by the [place] FDA and has been authorized for detection and/or diagnosis of SARS-CoV-2 by FDA  under an Emergency Use Authorization (EUA). This EUA will remain in effect (meaning this test can be used) for the duration of the COVID-19 declaration under Section 564(b)(1) of the Act, 21 U.S.C. section 360bbb-3(b)(1), unless the authorization is terminated or revoked.  Performed at [facility], [address], [place], [place] [address]   Urine Culture (for pregnant, neutropenic or urologic patients or  patients with an indwelling urinary catheter)     Status: Abnormal   Collection Time: 09/04/23  8:27 PM   Specimen: Urine, Clean Catch  Result Value Ref Range Status   Specimen Description URINE, CLEAN CATCH  Final   Special Requests NONE  Final   Culture (A)  Final    >=100,000 COLONIES/mL AEROCOCCUS URINAE Standardized susceptibility testing for this organism is not available. Performed at [facility], [address], [place], [place] [address]    Report Status 09/07/2023 FINAL  Final         Radiology Studies: No results found.       Scheduled Meds:  amLODipine  2.5 mg Oral Daily   aspirin  81 mg Oral Daily   atorvastatin  40 mg Oral QHS   cholecalciferol  1,000 Units Oral QHS   gabapentin  100 mg Oral QHS   insulin aspart  0-15 Units Subcutaneous TID PC & HS   insulin glargine-yfgn  20 Units Subcutaneous Daily   omega-3 acid ethyl esters  1 g Oral QHS   oxybutynin  5 mg Oral Daily   pantoprazole  40 mg Oral BID   senna-docusate  2 tablet Oral QHS   sertraline  100 mg Oral Daily   ticagrelor  90 mg Oral BID   Continuous Infusions:   LOS: 0 days    Time spent: 35 minutes    Ramiro Harvest, MD [facility]   To contact the attending provider between 7A-7P or the covering provider during after hours 7P-7A, please log into the web site [email or web address] and access using universal [place] password for that web site. If you do not have the password, please call the hospital operator.  09/07/2023, 10:33 AM

## 2023-09-07 NOTE — Consult Note (Addendum)
NEUROLOGY CONSULT NOTE   Date of service: September 07, 2023 Patient Name: Bailey [name] MRN:  [id number] DOB:  [birth year] Chief Complaint: "Seizure like activity" Requesting Provider: Rodolph Bong, MD  History of Present Illness  Bailey [name] is a 67 y.o. 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  Patient is aphasic and unable to give any history.  Staff at the facility originally reported that first episode of whole body shaking lasted 30 seconds, second lasted 1 minute and third episode lasted 2 minutes.  This morning, patient had 2 seizure-like episodes around 10 AM when the nurse was getting her out of bed.  The first episode consisted of patient staring off into space, being unable to follow commands and having some facial twitching.  Second episode also consisted of patient staring off into space with lip twitching as well as emesis and bowel incontinence.  However, patient normally is incontinent of bowel and bladder.  Head CT on admission demonstrated no intracranial abnormality but large old left MCA territory infarct, which certainly could place patient at risk for having seizures.  ROS   Unable to ascertain due to baseline aphasia  Past History   Past Medical History:  Diagnosis Date   Arthritis    Cataract    COPD (chronic obstructive pulmonary disease) (HCC)    Diabetes mellitus without complication (HCC)    Fibrocystic breast disease July 2016   GERD (gastroesophageal reflux disease)    Hyperlipidemia    Hypertension     Past Surgical History:  Procedure Laterality Date   BREAST BIOPSY Right 04/19/2015   benign   BREAST BIOPSY Right 11/01/2020   fibroadenoma   CATARACT EXTRACTION     CHOLECYSTECTOMY     COLONOSCOPY     GALLBLADDER SURGERY     IR ANGIO INTRA EXTRACRAN SEL COM CAROTID INNOMINATE  UNI R MOD SED  05/16/2022   IR ANGIO VERTEBRAL SEL VERTEBRAL UNI L MOD SED  05/16/2022   IR CT HEAD LTD  05/16/2022   IR CT HEAD LTD  05/16/2022   IR CT HEAD LTD  05/16/2022   IR INTRAVSC STENT CERV CAROTID W/O EMB-PROT MOD SED INC ANGIO  05/16/2022   IR PERCUTANEOUS ART THROMBECTOMY/INFUSION INTRACRANIAL INC DIAG ANGIO  05/16/2022   RADIOLOGY WITH ANESTHESIA N/A 05/15/2022   Procedure: IR WITH ANESTHESIA;  Surgeon: Radiologist, Medication, MD;  Location: [facility] OR;  Service: Radiology;  Laterality: N/A;   UTERINE FIBROID SURGERY      Family History: Family History  Problem Relation Age of Onset   Diabetes Mother    Stroke Mother    Dementia Mother    Heart disease Father    Diabetes Father    Diabetes Brother    Dementia Brother    Renal Disease Brother    Diabetes Sister    Narcolepsy Sister    Arthritis Sister    Diabetes Sister    Obesity Sister    Diabetes Brother    Heart disease Brother    Cancer Brother        pancreatic   Diabetes Brother    Diabetes Brother    Stomach cancer Sister    Colon cancer Neg Hx    Esophageal cancer Neg Hx    Rectal cancer Neg Hx     Social History  reports that she has been smoking cigarettes. She has  a 34 pack-year smoking history. She has been exposed to tobacco smoke. She has never used smokeless tobacco. She reports current alcohol use. She reports that she does not use drugs.  Allergies  Allergen Reactions   Beef-Derived Drug Products     Unknown reaction   Pork-Derived Products     Unknown reaction    Medications   Current Facility-Administered Medications:    acetaminophen (TYLENOL) tablet 650 mg, 650 mg, Oral, Q6H PRN, Tu, Ching T, DO, 650 mg at 09/05/23 2141   amLODipine (NORVASC) tablet 2.5 mg, 2.5 mg, Oral, Daily, Tu, Ching T, DO, 2.5 mg at 09/07/23 2536   aspirin chewable tablet 81 mg, 81 mg, Oral, Daily, Tu, Ching T, DO, 81 mg at 09/07/23 0917   atorvastatin (LIPITOR) tablet 40 mg, 40 mg, Oral, QHS, Tu, Ching T, DO, 40 mg  at 09/06/23 2136   cholecalciferol (VITAMIN D3) 25 MCG (1000 UNIT) tablet 1,000 Units, 1,000 Units, Oral, QHS, Tu, Ching T, DO, 1,000 Units at 09/06/23 2136   gabapentin (NEURONTIN) capsule 100 mg, 100 mg, Oral, QHS, Tu, Ching T, DO, 100 mg at 09/06/23 2136   insulin aspart (novoLOG) injection 0-15 Units, 0-15 Units, Subcutaneous, TID PC & HS, Tu, Ching T, DO, 5 Units at 09/07/23 1225   insulin glargine-yfgn (SEMGLEE) injection 20 Units, 20 Units, Subcutaneous, Daily, Rodolph Bong, MD, 20 Units at 09/07/23 1225   LORazepam (ATIVAN) injection 2 mg, 2 mg, Intravenous, Q4H PRN, Rodolph Bong, MD   morphine (PF) 2 MG/ML injection 1 mg, 1 mg, Intravenous, Q4H PRN, Jerald Kief, MD, 1 mg at 09/05/23 2315   omega-3 acid ethyl esters (LOVAZA) capsule 1 g, 1 g, Oral, QHS, Tu, Ching T, DO, 1 g at 09/06/23 2136   oxybutynin (DITROPAN-XL) 24 hr tablet 5 mg, 5 mg, Oral, Daily, Tu, Ching T, DO, 5 mg at 09/07/23 0918   pantoprazole (PROTONIX) EC tablet 40 mg, 40 mg, Oral, BID, Tu, Ching T, DO, 40 mg at 09/07/23 6440   senna-docusate (Senokot-S) tablet 2 tablet, 2 tablet, Oral, QHS, Tu, Ching T, DO, 2 tablet at 09/06/23 2136   sertraline (ZOLOFT) tablet 100 mg, 100 mg, Oral, Daily, Tu, Ching T, DO, 100 mg at 09/07/23 3474   ticagrelor (BRILINTA) tablet 90 mg, 90 mg, Oral, BID, Tu, Ching T, DO, 90 mg at 09/07/23 0917  Vitals   Vitals:   09/07/23 0340 09/07/23 0347 09/07/23 0752 09/07/23 1202  BP: (!) 125/57 (!) 127/53 (!) 123/49 135/61  Pulse:  99 (!) 105 (!) 102  Resp: 19 18    Temp: 99 F (37.2 C) 97.7 F (36.5 C) 99.2 F (37.3 C) 98.3 F (36.8 C)  TempSrc:  Oral Oral Oral  SpO2: 100% 99% 99% 99%  Weight:      Height:        Body mass index is 29.13 kg/m.  Physical Exam   Constitutional: Chronically ill-appearing elderly patient in no acute distress Psych: Affect appropriate to situation.  Eyes: No scleral injection.  HENT: No OP obstruction.  Head: Normocephalic.   Respiratory: Effort normal, non-labored breathing.  Skin: WDI.   Neurologic Examination    NEURO:  Mental Status: Patient will respond to voice but is by saying "so-so" but does not appear to have understand what is being said to her.  She is unable to follow simple commands but can mimic Speech/Language: Patient is only able to say the phrase "so-so", no other verbal output noted  Cranial Nerves:  II:  PERRL.  III, IV, VI: EOMI.  VII: Right facial droop VIII: hearing intact to voice. IX, X: Phonation is normal.  XII: Noncooperative with tongue protrusion Motor: Able to move left upper and lower extremities with antigravity strength, no movement of right upper extremity, able to withdraw the right lower extremity to noxious Tone: is normal and bulk is normal Sensation-appears intact to light touch on the left, intact to noxious on the right Coordination: Unable to perform Gait- deferred    Labs/Imaging/Neurodiagnostic studies   CBC:  Recent Labs  Lab 09/28/23 1610 2023-09-28 1637 09/06/23 0600 09/07/23 0526  WBC 12.7*   < > 11.1* 12.5*  NEUTROABS 9.5*  --   --   --   HGB 8.6*   < > 7.8* 7.6*  HCT 29.0*   < > 26.3* 25.4*  MCV 63.5*   < > 62.8* 63.0*  PLT 391   < > 352 339   < > = values in this interval not displayed.   Basic Metabolic Panel:  Lab Results  Component Value Date   NA 135 09/07/2023   K 4.2 09/07/2023   CO2 22 09/07/2023   GLUCOSE 202 (H) 09/07/2023   BUN 16 09/07/2023   CREATININE 0.70 09/07/2023   CALCIUM 8.8 (L) 09/07/2023   GFRNONAA >60 09/07/2023   GFRAA >60 02/05/2015   Lipid Panel:  Lab Results  Component Value Date   LDLCALC 25 05/16/2022   HgbA1c:  Lab Results  Component Value Date   HGBA1C 7.0 (H) 2023-09-28   Urine Drug Screen:     Component Value Date/Time   LABOPIA NONE DETECTED 05/16/2022 0405   COCAINSCRNUR NONE DETECTED 05/16/2022 0405   LABBENZ NONE DETECTED 05/16/2022 0405   AMPHETMU NONE DETECTED 05/16/2022 0405    THCU NONE DETECTED 05/16/2022 0405   LABBARB NONE DETECTED 05/16/2022 0405    Alcohol Level     Component Value Date/Time   ETH <10 05/15/2022 1824   INR  Lab Results  Component Value Date   INR 1.0 05/15/2022   APTT  Lab Results  Component Value Date   APTT 23 (L) 05/15/2022    CT Head without contrast(Personally reviewed): No acute abnormality, old left MCA territory stroke   Neurodiagnostics rEEG 12/15:  Continuous slow, left hemisphere, suggestive of cortical dysfunction arising from left hemisphere likely secondary to underlying stroke, no seizures or epileptiform discharges  Continuous EEG 12/17: Pending  ASSESSMENT   Bailey [name] is a 67 y.o. female  has a past medical history of Arthritis, Cataract, COPD (chronic obstructive pulmonary disease) (HCC), Diabetes mellitus without complication (HCC), Fibrocystic breast disease (July 2016), GERD (gastroesophageal reflux disease), Hyperlipidemia, and Hypertension.  Patient presents from her facility with a history of 3 episodes of seizure-like activity consisting of whole body shaking.  Earlier this morning, patient had 2 episodes of facial twitching and staring off into space, the second of which was accompanied by emesis and bowel incontinence.  Large old left MCA territory stroke was seen on admission head CT, and this certainly could patient place patient at risk for seizure activity.  Routine EEG demonstrated no seizure activity.  Given different presentations of seizure activity and patient's inability to give a history, would be worthwhile to place on long-term EEG for spell characterization.  RECOMMENDATIONS  - LTM EEG for spell characterization - Based on clinical description of events being c/w seizures (at least the 2 witnessed by RN this AM) and her large prior stroke will start keppra 500mg  bid  empiric tx for sz ______________________________________________________________________  Patient seen by NP and  then by MD, MD to edit note as needed.  Signed, Cortney E Ernestina Columbia, NP [facility]    Attending Neurohospitalist Addendum Patient seen and examined with APP/Resident. Agree with the history and physical as documented above. Agree with the plan as documented, which I helped formulate. I have edited the note above to reflect my full findings and recommendations. I have independently reviewed the chart, obtained history, review of systems and examined the patient.I have personally reviewed pertinent head/neck/spine imaging (CT/MRI). Please feel free to call with any questions.  -- Bing Neighbors, MD [facility] [phone]  If 7pm- 7am, please page neurology on call as listed in AMION.

## 2023-09-08 ENCOUNTER — Inpatient Hospital Stay (HOSPITAL_COMMUNITY): Payer: Medicare HMO

## 2023-09-08 DIAGNOSIS — D649 Anemia, unspecified: Secondary | ICD-10-CM | POA: Diagnosis not present

## 2023-09-08 DIAGNOSIS — D72829 Elevated white blood cell count, unspecified: Secondary | ICD-10-CM

## 2023-09-08 DIAGNOSIS — E119 Type 2 diabetes mellitus without complications: Secondary | ICD-10-CM | POA: Diagnosis not present

## 2023-09-08 DIAGNOSIS — R6510 Systemic inflammatory response syndrome (SIRS) of non-infectious origin without acute organ dysfunction: Secondary | ICD-10-CM | POA: Diagnosis not present

## 2023-09-08 DIAGNOSIS — R569 Unspecified convulsions: Secondary | ICD-10-CM | POA: Diagnosis not present

## 2023-09-08 LAB — CBC WITH DIFFERENTIAL/PLATELET
Abs Immature Granulocytes: 0.1 10*3/uL — ABNORMAL HIGH (ref 0.00–0.07)
Basophils Absolute: 0.1 10*3/uL (ref 0.0–0.1)
Basophils Relative: 0 %
Eosinophils Absolute: 0.2 10*3/uL (ref 0.0–0.5)
Eosinophils Relative: 1 %
HCT: 25.2 % — ABNORMAL LOW (ref 36.0–46.0)
Hemoglobin: 7.4 g/dL — ABNORMAL LOW (ref 12.0–15.0)
Immature Granulocytes: 1 %
Lymphocytes Relative: 22 %
Lymphs Abs: 3.5 10*3/uL (ref 0.7–4.0)
MCH: 18.7 pg — ABNORMAL LOW (ref 26.0–34.0)
MCHC: 29.4 g/dL — ABNORMAL LOW (ref 30.0–36.0)
MCV: 63.8 fL — ABNORMAL LOW (ref 80.0–100.0)
Monocytes Absolute: 1.4 10*3/uL — ABNORMAL HIGH (ref 0.1–1.0)
Monocytes Relative: 9 %
Neutro Abs: 10.8 10*3/uL — ABNORMAL HIGH (ref 1.7–7.7)
Neutrophils Relative %: 67 %
Platelets: 319 10*3/uL (ref 150–400)
RBC: 3.95 MIL/uL (ref 3.87–5.11)
RDW: 21.1 % — ABNORMAL HIGH (ref 11.5–15.5)
WBC: 16 10*3/uL — ABNORMAL HIGH (ref 4.0–10.5)
nRBC: 0.2 % (ref 0.0–0.2)

## 2023-09-08 LAB — BASIC METABOLIC PANEL
Anion gap: 9 (ref 5–15)
BUN: 16 mg/dL (ref 8–23)
CO2: 20 mmol/L — ABNORMAL LOW (ref 22–32)
Calcium: 8.8 mg/dL — ABNORMAL LOW (ref 8.9–10.3)
Chloride: 107 mmol/L (ref 98–111)
Creatinine, Ser: 0.75 mg/dL (ref 0.44–1.00)
GFR, Estimated: >60 mL/min (ref >60)
Glucose, Bld: 182 mg/dL — ABNORMAL HIGH (ref 70–99)
Potassium: 3.8 mmol/L (ref 3.5–5.1)
Sodium: 136 mmol/L (ref 135–145)

## 2023-09-08 LAB — GLUCOSE, CAPILLARY
Glucose-Capillary: 182 mg/dL — ABNORMAL HIGH (ref 70–99)
Glucose-Capillary: 181 mg/dL — ABNORMAL HIGH (ref 70–99)
Glucose-Capillary: 194 mg/dL — ABNORMAL HIGH (ref 70–99)
Glucose-Capillary: 188 mg/dL — ABNORMAL HIGH (ref 70–99)
Glucose-Capillary: 225 mg/dL — ABNORMAL HIGH (ref 70–99)

## 2023-09-08 LAB — URINALYSIS, COMPLETE (UACMP) WITH MICROSCOPIC
Bilirubin Urine: NEGATIVE
Glucose, UA: NEGATIVE mg/dL
Hgb urine dipstick: NEGATIVE
Ketones, ur: NEGATIVE mg/dL
Nitrite: NEGATIVE
Protein, ur: NEGATIVE mg/dL
Specific Gravity, Urine: 1.017 (ref 1.005–1.030)
pH: 5 (ref 5.0–8.0)

## 2023-09-08 LAB — MAGNESIUM: Magnesium: 1.8 mg/dL (ref 1.7–2.4)

## 2023-09-08 MED ORDER — RIVAROXABAN 10 MG PO TABS
10.0000 mg | ORAL_TABLET | Freq: Every day | ORAL | Status: DC
  Administered 2023-09-08 – 2023-09-09 (×2): 10 mg via ORAL
  Filled 2023-09-08 (×2): qty 1

## 2023-09-08 MED ORDER — LEVETIRACETAM 500 MG PO TABS
500.0000 mg | ORAL_TABLET | Freq: Two times a day (BID) | ORAL | Status: DC
  Administered 2023-09-08 – 2023-09-10 (×4): 500 mg via ORAL
  Filled 2023-09-08 (×4): qty 1

## 2023-09-08 MED ORDER — SODIUM CHLORIDE 0.9 % IV SOLN
3.0000 g | Freq: Three times a day (TID) | INTRAVENOUS | Status: DC
  Administered 2023-09-08 – 2023-09-10 (×5): 3 g via INTRAVENOUS
  Filled 2023-09-08 (×5): qty 8

## 2023-09-08 MED ORDER — GADOBUTROL 1 MMOL/ML IV SOLN
7.5000 mL | Freq: Once | INTRAVENOUS | Status: AC | PRN
  Administered 2023-09-08: 7.5 mL via INTRAVENOUS

## 2023-09-08 NOTE — Progress Notes (Signed)
LTM EEG discontinued by EC- no skin breakdown at [facility].

## 2023-09-08 NOTE — Progress Notes (Signed)
NEUROLOGY CONSULT FOLLOW UP NOTE   Date of service: September 08, 2023 Patient Name: Bailey [name] MRN:  [id number] DOB:  12-08-1955  Brief HPI  Bailey [name] is a 67 y.o. female  has a past medical history of Arthritis, Cataract, COPD (chronic obstructive pulmonary disease) (HCC), Diabetes mellitus without complication (HCC), Fibrocystic breast disease (July 2016), GERD (gastroesophageal reflux disease), Hyperlipidemia, and Hypertension. who presented with 3 episodes of full body shaking.  Yesterday, 2 episodes of seizure-like activity consisting of facial twitching and staring off into space were observed by the RN.  Patient has baseline aphasia and right hemiparesis and is unable to give a history.  She was placed on long-term EEG and started on Keppra given likelihood of seizure activity.   Interval Hx/subjective   Patient has been hemodynamically stable and afebrile.  She has had no reported episodes of seizure-like activity overnight, and no events were seen on long-term EEG.  Vitals   Vitals:   09/07/23 2100 09/08/23 0005 09/08/23 0400 09/08/23 0754  BP: (!) 149/52 (!) 120/55 124/77 (!) 129/50  Pulse:   (!) 108 (!) 102  Resp: 18 18 18 18   Temp: 98.6 F (37 C) 99 F (37.2 C) 98.4 F (36.9 C) 98.3 F (36.8 C)  TempSrc: Axillary Oral Oral Oral  SpO2: 100% 100% 100% 99%  Weight:      Height:         Body mass index is 29.13 kg/m.  Physical Exam   Constitutional: Well-developed, well-nourished, chronically ill-appearing patient in no acute distress Psych: Affect blunted Eyes: No scleral injection.  HENT: No OP obstrucion.  Head: Normocephalic.  Respiratory: Effort normal, non-labored breathing.  GI: Soft.  No distension. There is no tenderness.  Skin: WDI.   Neurologic Examination    NEURO:  Mental Status: Patient is only able to say "so-so" and has receptive and expressive aphasia.  She cannot follow simple commands but can mimic Speech/Language: speech  is with receptive and expressive aphasia but no dysarthria  Cranial Nerves:  II: PERRL.  III, IV, VI: EOMI. Eyelids elevate symmetrically.  V: Patient with sensation appears to be intact to light touch VII: Right facial droop VIII: hearing intact to voice. IX, X: Phonation is normal.  XII: tongue is midline without fasciculations. Motor: Patient moves left upper extremity with good antigravity strength, does not move right upper extremity, will move bilateral lower extremities but does not lift them off the bed Tone: is normal and bulk is normal Sensation-appears intact to light touch bilaterally Coordination: Unable to perform Gait- deferred   Medications  Current Facility-Administered Medications:    acetaminophen (TYLENOL) tablet 650 mg, 650 mg, Oral, Q6H PRN, Tu, Ching T, DO, 650 mg at 09/07/23 1719   amLODipine (NORVASC) tablet 2.5 mg, 2.5 mg, Oral, [name], Tu, Ching T, DO, 2.5 mg at 09/07/23 4401   aspirin chewable tablet 81 mg, 81 mg, Oral, [name], Tu, Ching T, DO, 81 mg at 09/07/23 0917   atorvastatin (LIPITOR) tablet 40 mg, 40 mg, Oral, QHS, Tu, Ching T, DO, 40 mg at 09/07/23 2130   cholecalciferol (VITAMIN D3) 25 MCG (1000 UNIT) tablet 1,000 Units, 1,000 Units, Oral, QHS, Tu, Ching T, DO, 1,000 Units at 09/07/23 2130   gabapentin (NEURONTIN) capsule 100 mg, 100 mg, Oral, QHS, Tu, Ching T, DO, 100 mg at 09/07/23 2130   insulin aspart (novoLOG) injection 0-15 Units, 0-15 Units, Subcutaneous, TID PC & HS, Tu, Ching T, DO, 8 Units at 09/07/23 2134  insulin glargine-yfgn (SEMGLEE) injection 20 Units, 20 Units, Subcutaneous, [name], Rodolph Bong, MD, 20 Units at 09/07/23 1225   levETIRAcetam (KEPPRA) IVPB 500 mg/100 mL premix, 500 mg, Intravenous, Q12H, Jefferson Fuel, MD   LORazepam (ATIVAN) injection 2 mg, 2 mg, Intravenous, Q4H PRN, Rodolph Bong, MD   metoprolol tartrate (LOPRESSOR) injection 2.5 mg, 2.5 mg, Intravenous, Q5 min PRN, Sundil, Subrina, MD   morphine  (PF) 2 MG/ML injection 1 mg, 1 mg, Intravenous, Q4H PRN, Jerald Kief, MD, 1 mg at 09/05/23 2315   omega-3 acid ethyl esters (LOVAZA) capsule 1 g, 1 g, Oral, QHS, Tu, Ching T, DO, 1 g at 09/07/23 2130   oxybutynin (DITROPAN-XL) 24 hr tablet 5 mg, 5 mg, Oral, [name], Tu, Ching T, DO, 5 mg at 09/07/23 0918   pantoprazole (PROTONIX) EC tablet 40 mg, 40 mg, Oral, BID, Tu, Ching T, DO, 40 mg at 09/07/23 2130   senna-docusate (Senokot-S) tablet 2 tablet, 2 tablet, Oral, QHS, Tu, Ching T, DO, 2 tablet at 09/07/23 2130   sertraline (ZOLOFT) tablet 100 mg, 100 mg, Oral, [name], Tu, Ching T, DO, 100 mg at 09/07/23 1610   ticagrelor (BRILINTA) tablet 90 mg, 90 mg, Oral, BID, Tu, Ching T, DO, 90 mg at 09/07/23 2130 Labs and Diagnostic Imaging   CBC:  Recent Labs  Lab 09/04/23 1610 09/04/23 1637 09/07/23 0526 09/08/23 0553  WBC 12.7*   < > 12.5* 16.0*  NEUTROABS 9.5*  --   --  10.8*  HGB 8.6*   < > 7.6* 7.4*  HCT 29.0*   < > 25.4* 25.2*  MCV 63.5*   < > 63.0* 63.8*  PLT 391   < > 339 319   < > = values in this interval not displayed.    Basic Metabolic Panel:  Lab Results  Component Value Date   NA 136 09/08/2023   K 3.8 09/08/2023   CO2 20 (L) 09/08/2023   GLUCOSE 182 (H) 09/08/2023   BUN 16 09/08/2023   CREATININE 0.75 09/08/2023   CALCIUM 8.8 (L) 09/08/2023   GFRNONAA >60 09/08/2023   GFRAA >60 02/05/2015   Lipid Panel:  Lab Results  Component Value Date   LDLCALC 25 05/16/2022   HgbA1c:  Lab Results  Component Value Date   HGBA1C 7.0 (H) 09/04/2023   Urine Drug Screen:     Component Value Date/Time   LABOPIA NONE DETECTED 05/16/2022 0405   COCAINSCRNUR NONE DETECTED 05/16/2022 0405   LABBENZ NONE DETECTED 05/16/2022 0405   AMPHETMU NONE DETECTED 05/16/2022 0405   THCU NONE DETECTED 05/16/2022 0405   LABBARB NONE DETECTED 05/16/2022 0405    Alcohol Level     Component Value Date/Time   ETH <10 05/15/2022 1824   INR  Lab Results  Component Value Date   INR 1.0  05/15/2022   APTT  Lab Results  Component Value Date   APTT 23 (L) 05/15/2022   AED levels: No results found for: "PHENYTOIN", "ZONISAMIDE", "LAMOTRIGINE", "LEVETIRACETA"  CT Head without contrast(Personally reviewed): No acute abnormality, old MCA territory stroke   rEEG 12/15:  Continuous slow, left hemisphere, suggestive of cortical dysfunction arising from left hemisphere likely secondary to underlying stroke, no seizures or epileptiform discharges   Continuous EEG 12/17 to 12/18: Continuous slow, left hemisphere, suggestive of cortical dysfunction arising from the left hemisphere secondary to underlying stroke, no seizures or epileptiform discharges  Assessment   Bailey [name] is a 67 y.o. 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  She had 2 episodes of facial twitching and staring off into space which were witnessed by the RN yesterday.  She was placed on long-term EEG and loaded with Keppra.  No new episodes were observed, and long-term EEG demonstrates no seizure activity.  Will discontinue long-term EEG and continue maintenance Keppra.  Will obtain MRI brain with and without contrast given left gaze deviation with seizures.  Recommendations  - Discontinue LTM EEG - Continue Keppra 500 mg twice [name] - MRI brain with and without contrast -Neurology will follow up MRI results and will otherwise be available prn for questions going forward ______________________________________________________________________   Signed, Cortney Harland Dingwall, NP [facility]    Attending Neurohospitalist Addendum Patient seen and examined with APP/Resident. Agree with the history and physical as documented above. Agree with the plan as documented, which I helped formulate. I have edited the note above to reflect my full findings and  recommendations. I have independently reviewed the chart, obtained history, review of systems and examined the patient.I have personally reviewed pertinent head/neck/spine imaging (CT/MRI). Please feel free to call with any questions.  MRI brain personally reviewed and is stable: 1. No acute intracranial abnormality. 2. Encephalomalacia and gliosis in the left PCA distribution with chronic petechial blood products and cortical laminar necrosis. Old left ACA territory infarct. 3. Chronically abnormal left ICA flow void.  Continue keppra 500mg  bid. Neurology will not continue to actively follow, but please re-engage if additional neurologic concerns arise.  -- Bing Neighbors, MD [facility] [phone]  If 7pm- 7am, please page neurology on call as listed in AMION.

## 2023-09-08 NOTE — Procedures (Addendum)
Patient Name: [name]  MRN: [id number]  Epilepsy Attending: Charlsie Quest  Referring Physician/Provider: Marjorie Smolder, NP  Duration: 09/07/2023 1557 to 1344  Patient history: 67 year old female with seizure-like episode described as whole body shaking.  EEG to evaluate for seizure.  Level of alertness: Awake, asleep  AEDs during EEG study: Gabapentin, Keppra  Technical aspects: This EEG study was done with scalp electrodes positioned according to the 10-20 International system of electrode placement. Electrical activity was reviewed with band pass filter of 1-70Hz , sensitivity of 7 uV/mm, display speed of 55mm/sec with a 60Hz  notched filter applied as appropriate. EEG data were recorded continuously and digitally stored.  Video monitoring was available and reviewed as appropriate.  Description: The posterior dominant rhythm consists of 9 Hz activity of moderate voltage (25-35 uV) seen predominantly in posterior head regions, asymmetric ( left<right) and reactive to eye opening and eye closing. Sleep was characterized by vertex waves, sleep spindles (12 to 14 Hz), maximal frontocentral region. EEG showed continuous 3 to 5 Hz theta-delta slowing in left hemisphere. Hyperventilation and photic stimulation were not performed.      ABNORMALITY - Continuous slow,  left hemisphere   IMPRESSION: This study is suggestive of cortical dysfunction arising from left hemisphere likely secondary to underlying stroke. No seizures or epileptiform discharges were seen throughout the recording.   Bailey [name]

## 2023-09-08 NOTE — Progress Notes (Addendum)
Occupational Therapy Treatment Patient Details Name: Bailey [name] MRN: [id number] DOB: 1956/09/16 Today's Date: 09/08/2023   History of present illness 67 y.o. female sent over from [facility] after pt had tremors that staff thought were seizures. EEG negative for seizure PMH includes: HTN, HLD, DM II, L MCA CVA with aphasia, and tobacco use.   OT comments  Pt progressing towards goals, pt mod-max A for bed mobility, upon sitting EOB, asked pt to wash face and pt points to whole body and nods "yes" when asked if she wants to bathe. Pt mod -max A for UB/LB dressing and bathing tasks at EOB. Pt has ROM and ability but needs encouragement to attempt task. Pt provided with palm guard for R hand for comfort due to tone/contracture, pt denies pain upon donning and is able to demo unfastening strap with L hand if needed to remove. RN also aware. Pt presenting with impairments listed below, will follow acutely. Patient will benefit from continued inpatient follow up therapy, <3 hours/day to maximize safety/ind with ADL/functional mobility.  Addendum 1430: Rechecked palm guard with no signs of skin irritation/skin breakdown, pt requests to keep palm guard on. Placed sign up in room that palm guard is for comfort only and doff at night. RN notified via securechat. Will follow up next date.       If plan is discharge home, recommend the following:  Two people to help with walking and/or transfers;A lot of help with bathing/dressing/bathroom;Assistance with cooking/housework;Direct supervision/assist for medications management;Direct supervision/assist for financial management;Assist for transportation;Help with stairs or ramp for entrance   Equipment Recommendations  Other (comment) (defer)    Recommendations for Other Services      Precautions / Restrictions Precautions Precautions: Fall;Other (comment) Precaution Comments: R hemiparesis,  R palm guard for  comfort Restrictions Weight Bearing Restrictions Per Provider Order: No       Mobility Bed Mobility Overal bed mobility: Needs Assistance Bed Mobility: Supine to Sit, Sit to Supine     Supine to sit: Mod assist Sit to supine: Max assist   General bed mobility comments: pt assisting LLE to EOB and pulls with LUE    Transfers                   General transfer comment: deferred     Balance Overall balance assessment: Needs assistance Sitting-balance support: No upper extremity supported, Feet supported Sitting balance-Leahy Scale: Fair     Standing balance support: Single extremity supported Standing balance-Leahy Scale: Poor                             ADL either performed or assessed with clinical judgement   ADL Overall ADL's : Needs assistance/impaired         Upper Body Bathing: Maximal assistance Upper Body Bathing Details (indicate cue type and reason): pt would not attempt Lower Body Bathing: Maximal assistance Lower Body Bathing Details (indicate cue type and reason): pt would not attempt Upper Body Dressing : Moderate assistance   Lower Body Dressing: Maximal assistance                      Extremity/Trunk Assessment Upper Extremity Assessment Upper Extremity Assessment: Right hand dominant RUE Deficits / Details: incr tone distally vs proximally, pt keeps very guarded and grimaces with ROM, wrist contracted in flexed position and keeps digits in fist. No AROM noted. . Pt does not let therapist  elevate RUE on pillow for support   Lower Extremity Assessment Lower Extremity Assessment: Defer to PT evaluation        Vision   Additional Comments: will further assess   Perception Perception Perception: Not tested   Praxis Praxis Praxis: Not tested    Cognition Arousal: Alert Behavior During Therapy: [facility] for tasks assessed/performed Overall Cognitive Status: Difficult to assess                                  General Comments: following most commands with L side        Exercises      Shoulder Instructions       General Comments VSS    Pertinent Vitals/ Pain       Pain Assessment Pain Assessment: Faces Pain Location: unable to state Pain Descriptors / Indicators: Grimacing Pain Intervention(s): Limited activity within patient's tolerance, Monitored during session, Repositioned  Home Living                                          Prior Functioning/Environment              Frequency  Min 1X/week        Progress Toward Goals  OT Goals(current goals can now be found in the care plan section)  Progress towards OT goals: Progressing toward goals  Acute Rehab OT Goals Patient Stated Goal: none stated OT Goal Formulation: With patient Time For Goal Achievement: 09/20/23 Potential to Achieve Goals: Good ADL Goals Pt Will Perform Upper Body Dressing: with min assist;sitting Pt Will Perform Lower Body Dressing: with mod assist;sit to/from stand;sitting/lateral leans Pt Will Transfer to Toilet: with min assist;bedside commode;stand pivot transfer;squat pivot transfer Additional ADL Goal #1: pt will perform bed mobility min A in prep for ADLs  Plan      Co-evaluation                 AM-PAC OT "6 Clicks" Daily Activity     Outcome Measure   Help from another person eating meals?: A Lot Help from another person taking care of personal grooming?: A Lot Help from another person toileting, which includes using toliet, bedpan, or urinal?: A Lot Help from another person bathing (including washing, rinsing, drying)?: A Lot Help from another person to put on and taking off regular upper body clothing?: A Lot Help from another person to put on and taking off regular lower body clothing?: A Lot 6 Click Score: 12    End of Session    OT Visit Diagnosis: Unsteadiness on feet (R26.81);Other abnormalities of gait and mobility (R26.89);Muscle  weakness (generalized) (M62.81);Hemiplegia and hemiparesis Hemiplegia - Right/Left: Right Hemiplegia - caused by: Cerebral infarction   Activity Tolerance Patient tolerated treatment well   Patient Left in bed;with call bell/phone within reach;with bed alarm set   Nurse Communication Mobility status        Time: 1324-4010 OT Time Calculation (min): 27 min  Charges: OT General Charges $OT Visit: 1 Visit OT Treatments $Self Care/Home Management : 23-37 mins  Carver Fila, OTD, OTR/L SecureChat Preferred Acute Rehab [phone]   Carver Fila Koonce 09/08/2023, 12:26 PM

## 2023-09-08 NOTE — Plan of Care (Signed)
  Problem: Education: Goal: Ability to describe self-care measures that may prevent or decrease complications (Diabetes Survival Skills Education) will improve Outcome: Not Progressing   Problem: Fluid Volume: Goal: Ability to maintain a balanced intake and output will improve Outcome: Progressing   Problem: Metabolic: Goal: Ability to maintain appropriate glucose levels will improve Outcome: Progressing

## 2023-09-08 NOTE — Progress Notes (Signed)
PROGRESS NOTE    Bailey [name]  ZYS:[id number] DOB: [birth year] DOA: 09/04/2023 PCP: Cyndia Skeeters, MD    Chief Complaint  Patient presents with   Seizures    Brief Narrative:  67 y.o. female with medical history significant of HTNM HLD, T2DM, CVA with aphasia sent over from [facility] after pt had tremors that staff thought were seizures.    Staff at facility reported 3 consecutive full body shaking episodes. First one lasted for 30 secs, then 1 min for the second and 2 mins for the third. Pt normally incontinent so unable to tell if she had loss of bowel or bladder control. Pt had only been complaining of right sided facial pain. Pt can only say "sort of sort of" and reportedly rubs her hand around right face and jaw.  No hx of prior seizures and no new medication that family is aware of. Family reported she was somewhat groggy when she was first brought to ED but now back to baseline.   Assessment & Plan:   Principal Problem:   Seizure-like activity (HCC) Active Problems:   Type 2 diabetes mellitus treated without insulin (HCC)   Hyperlipidemia   Essential hypertension   Anemia   SIRS (systemic inflammatory response syndrome) (HCC)   History of CVA (cerebrovascular accident)   #1 seizure-like activity, POA -Patient was admitted with tremors diffusely felt rigors versus true seizures. -CT head done on admission negative, patient with no focal neurological deficits. -Patient underwent EEG that showed cortical dysfunction from the left hemisphere consistent with prior CVA, otherwise no seizures or epileptiform discharges were seen throughout the recording. -Patient noted this morning to have some seizure-like episodes x 2 await left gaze, some facial twitching per RN with some urinary incontinence. -Dr.Chiu discussed with neurology and felt no convincing evidence to start AED now recommended to monitor patient and if patient has another spell then involve  neurology formally for considerations of AED at that time. -Due to seizure-like episodes x 2 the morning of 09/07/2023, neurology formally consulted and patient placed on LTM EEG and started on Keppra empirically.   -IV Ativan as needed. -Supportive care. -Neurology following and appreciate their input and recommendations.  2.  History of CVA -Patient with aphasia secondary to CVA and answers so-so to most questions. -Patient with a right-sided hemiparesis. -Continue aspirin, Brilinta, statin for secondary stroke prophylaxis. -PT/OT.  3.  SIRS, POA -Patient noted to have met criteria with tachycardia, tachypnea and leukocytosis on admission however unclear source. -Urinalysis overall not convincing of UTI. -Patient status post empiric IV Rocephin. -Patient with some complaints of right-sided facial pain with dental caries noted, - Panorex with poor dentition noted. -Currently afebrile. -Placed empirically on IV Unasyn pending repeat urine analysis urine cultures and chest x-ray.  4.  Anemia -Patient noted to have a hemoglobin of 10 in March which is slowly trending down to 8.6 in September. -Patient noted with a hemoglobin currently stable at 7.4. -Patient with no overt bleeding. -Anemia panel with iron level of 17, TIBC of 379. -Status post IV iron x 1 09/05/2023. -Follow H&H. -Transfusion threshold hemoglobin < 7.  5.  Hypertension -Continue Norvasc 2.5 mg daily.   6.  Hyperlipidemia -Statin.  7.  Diabetes mellitus type 2 -Hemoglobin A1c 7.0 (09/04/2023) -CBG 181 this morning. -Noted to be on Lantus 30 units daily. -Continue Semglee 20 units daily.  -SSI. -Continue Neurontin.  8.  Leukocytosis -Patient with a worsening leukocytosis, likely secondary to probable seizures.  Patient afebrile. -Blood cultures with no growth to date x 3 days. -Initial urine cultures with Aerococcus urinae. -Repeat UA with cultures and sensitivities. -Check a chest x-ray. -Patient noted  with dental caries and early on in the hospitalization was complaining of dental pain. -Placed empirically on IV Unasyn. -Repeat labs in the AM.   DVT prophylaxis: On Brilinta/SCDs Code Status: Full Family Communication: No family at bedside. Disposition: Likely back to skilled nursing facility once medically stable and cleared by neurology.  Status is: Observation The patient remains OBS appropriate and will d/c before 2 midnights.   Consultants:  Neurology: Dr. Selina Cooley 09/07/2023  Procedures:  CT head 09/04/2023 Orthopantogram 09/04/2023 Chest x-ray 09/04/2023 EEG 09/04/2023, 09/07/2023  Antimicrobials:  Anti-infectives (From admission, onward)    Start     Dose/Rate Route Frequency Ordered Stop   09/05/23 1000  cefTRIAXone (ROCEPHIN) 1 g in sodium chloride 0.9 % 100 mL IVPB  Status:  Discontinued        1 g 200 mL/hr over 30 Minutes Intravenous Every 24 hours 09/04/23 2049 09/05/23 0819   09/04/23 1715  cefTRIAXone (ROCEPHIN) 1 g in sodium chloride 0.9 % 100 mL IVPB        1 g 200 mL/hr over 30 Minutes Intravenous  Once 09/04/23 1710 09/04/23 1800         Subjective: Patient sitting up in bed working with OT.  EEG electrodes on head.  Patient more alert today and following commands.  Answers so-so to all questions.  No further seizure-like activity noted today.   Objective: Vitals:   09/07/23 2100 09/08/23 0005 09/08/23 0400 09/08/23 0754  BP: (!) 149/52 (!) 120/55 124/77 (!) 129/50  Pulse:   (!) 108 (!) 102  Resp: 18 18 18 18   Temp: 98.6 F (37 C) 99 F (37.2 C) 98.4 F (36.9 C) 98.3 F (36.8 C)  TempSrc: Axillary Oral Oral Oral  SpO2: 100% 100% 100% 99%  Weight:      Height:        Intake/Output Summary (Last 24 hours) at 09/08/2023 0902 Last data filed at 09/08/2023 0350 Gross per 24 hour  Intake 320 ml  Output 850 ml  Net -530 ml   Filed Weights   09/04/23 1525  Weight: 79.4 kg    Examination:  General exam: Alert. Respiratory system: Lungs  clear to auscultation bilaterally.  No wheezes, no crackles, no rhonchi.  Fair air movement.  Speaking in full sentences.   Cardiovascular system: Regular rate rhythm no murmurs rubs or gallops.  No JVD.  No lower extremity edema.  Gastrointestinal system: Abdomen is soft, nontender, nondistended, positive bowel sounds.  No rebound.  No guarding.  Central nervous system: Alert and oriented. No focal neurological deficits. Extremities: Symmetric 5 x 5 power. Skin: No rashes, lesions or ulcers Psychiatry: Judgement and insight appear poor. Mood & affect appropriate.     Data Reviewed: I have personally reviewed following labs and imaging studies  CBC: Recent Labs  Lab 09/04/23 1610 09/04/23 1637 09/05/23 0721 09/06/23 0600 09/07/23 0526 09/08/23 0553  WBC 12.7*  --  9.5 11.1* 12.5* 16.0*  NEUTROABS 9.5*  --   --   --   --  10.8*  HGB 8.6* 9.9* 7.8* 7.8* 7.6* 7.4*  HCT 29.0* 29.0* 26.4* 26.3* 25.4* 25.2*  MCV 63.5*  --  62.7* 62.8* 63.0* 63.8*  PLT 391  --  364 352 339 319    Basic Metabolic Panel: Recent Labs  Lab 09/04/23 1610 09/04/23 1637  09/05/23 0721 09/06/23 0600 09/07/23 0526 09/08/23 0553  NA 135 140 138 137 135 136  K 4.0 4.1 4.0 4.0 4.2 3.8  CL 105 106 107 106 106 107  CO2 21*  --  24 23 22  20*  GLUCOSE 231* 236* 179* 183* 202* 182*  BUN 10 10 9 9 16 16   CREATININE 0.69 0.60 0.91 0.61 0.70 0.75  CALCIUM 9.3  --  9.2 9.3 8.8* 8.8*  MG  --   --   --   --  1.8 1.8    GFR: Estimated Creatinine Clearance: 70.1 mL/min (by C-G formula based on SCr of 0.75 mg/dL).  Liver Function Tests: Recent Labs  Lab 09/04/23 1610 09/06/23 0600  AST 24 17  ALT 24 19  ALKPHOS 94 77  BILITOT 0.4 0.3  PROT 7.1 6.6  ALBUMIN 3.2* 3.0*    CBG: Recent Labs  Lab 09/07/23 1201 09/07/23 1647 09/07/23 2121 09/08/23 0618 09/08/23 0859  GLUCAP 209* 270* 269* 182* 181*     Recent Results (from the past 240 hours)  Culture, blood (routine x 2)     Status: None  (Preliminary result)   Collection Time: 09/04/23  4:08 PM   Specimen: BLOOD LEFT FOREARM  Result Value Ref Range Status   Specimen Description BLOOD LEFT FOREARM  Final   Special Requests   Final    BOTTLES DRAWN AEROBIC AND ANAEROBIC Blood Culture results may not be optimal due to an inadequate volume of blood received in culture bottles   Culture   Final    NO GROWTH 3 DAYS Performed at [facility], [address] [place], [place] [address]    Report Status PENDING  Incomplete  Culture, blood (routine x 2)     Status: None (Preliminary result)   Collection Time: 09/04/23  4:09 PM   Specimen: BLOOD  Result Value Ref Range Status   Specimen Description BLOOD SITE NOT SPECIFIED  Final   Special Requests   Final    BOTTLES DRAWN AEROBIC AND ANAEROBIC Blood Culture results may not be optimal due to an inadequate volume of blood received in culture bottles   Culture   Final    NO GROWTH 3 DAYS Performed at [facility], [address], [place], [place] [address]    Report Status PENDING  Incomplete  Resp panel by RT-PCR (RSV, Flu A&B, Covid) Anterior Nasal Swab     Status: None   Collection Time: 09/04/23  4:19 PM   Specimen: Anterior Nasal Swab  Result Value Ref Range Status   SARS Coronavirus 2 by RT PCR NEGATIVE NEGATIVE Final   Influenza A by PCR NEGATIVE NEGATIVE Final   Influenza B by PCR NEGATIVE NEGATIVE Final    Comment: (NOTE) The Xpert Xpress SARS-CoV-2/FLU/RSV plus assay is intended as an aid in the diagnosis of influenza from Nasopharyngeal swab specimens and should not be used as a sole basis for treatment. Nasal washings and aspirates are unacceptable for Xpert Xpress SARS-CoV-2/FLU/RSV testing.  Fact Sheet for Patients: BloggerCourse.com  Fact Sheet for Healthcare Providers: SeriousBroker.it  This test is not yet approved or cleared by the [place] FDA and has been authorized for detection  and/or diagnosis of SARS-CoV-2 by FDA under an Emergency Use Authorization (EUA). This EUA will remain in effect (meaning this test can be used) for the duration of the COVID-19 declaration under Section 564(b)(1) of the Act, 21 U.S.C. section 360bbb-3(b)(1), unless the authorization is terminated or revoked.  Resp Syncytial Virus by PCR NEGATIVE NEGATIVE Final    Comment: (NOTE) Fact Sheet for Patients: BloggerCourse.com  Fact Sheet for Healthcare Providers: SeriousBroker.it  This test is not yet approved or cleared by the [place] FDA and has been authorized for detection and/or diagnosis of SARS-CoV-2 by FDA under an Emergency Use Authorization (EUA). This EUA will remain in effect (meaning this test can be used) for the duration of the COVID-19 declaration under Section 564(b)(1) of the Act, 21 U.S.C. section 360bbb-3(b)(1), unless the authorization is terminated or revoked.  Performed at [facility], [address], [place], [place] [address]   Urine Culture (for pregnant, neutropenic or urologic patients or patients with an indwelling urinary catheter)     Status: Abnormal   Collection Time: 09/04/23  8:27 PM   Specimen: Urine, Clean Catch  Result Value Ref Range Status   Specimen Description URINE, CLEAN CATCH  Final   Special Requests NONE  Final   Culture (A)  Final    >=100,000 COLONIES/mL AEROCOCCUS URINAE Standardized susceptibility testing for this organism is not available. Performed at [facility], [address] [place], [place] [address]    Report Status 09/07/2023 FINAL  Final         Radiology Studies: No results found.       Scheduled Meds:  amLODipine  2.5 mg Oral Daily   aspirin  81 mg Oral Daily   atorvastatin  40 mg Oral QHS   cholecalciferol  1,000 Units Oral QHS   gabapentin  100 mg Oral QHS   insulin aspart  0-15 Units Subcutaneous TID PC & HS   insulin  glargine-yfgn  20 Units Subcutaneous Daily   omega-3 acid ethyl esters  1 g Oral QHS   oxybutynin  5 mg Oral Daily   pantoprazole  40 mg Oral BID   senna-docusate  2 tablet Oral QHS   sertraline  100 mg Oral Daily   ticagrelor  90 mg Oral BID   Continuous Infusions:  [id number]       LOS: 1 day    Time spent: 35 minutes    Ramiro Harvest, MD [facility]   To contact the attending provider between 7A-7P or the covering provider during after hours 7P-7A, please log into the web site [email or web address] and access using universal [place] password for that web site. If you do not have the password, please call the hospital operator.  09/08/2023, 9:02 AM

## 2023-09-08 NOTE — Plan of Care (Signed)
  Problem: Coping: Goal: Ability to adjust to condition or change in health will improve Outcome: Not Progressing   Problem: Metabolic: Goal: Ability to maintain appropriate glucose levels will improve Outcome: Not Progressing   Problem: Skin Integrity: Goal: Risk for impaired skin integrity will decrease Outcome: Not Progressing   Problem: Tissue Perfusion: Goal: Adequacy of tissue perfusion will improve Outcome: Not Progressing   Problem: Safety: Goal: Ability to remain free from injury will improve Outcome: Not Progressing

## 2023-09-09 DIAGNOSIS — E119 Type 2 diabetes mellitus without complications: Secondary | ICD-10-CM | POA: Diagnosis not present

## 2023-09-09 DIAGNOSIS — R569 Unspecified convulsions: Secondary | ICD-10-CM | POA: Diagnosis not present

## 2023-09-09 DIAGNOSIS — D649 Anemia, unspecified: Secondary | ICD-10-CM | POA: Diagnosis not present

## 2023-09-09 DIAGNOSIS — R6510 Systemic inflammatory response syndrome (SIRS) of non-infectious origin without acute organ dysfunction: Secondary | ICD-10-CM | POA: Diagnosis not present

## 2023-09-09 LAB — CBC WITH DIFFERENTIAL/PLATELET
Abs Immature Granulocytes: 0 10*3/uL (ref 0.00–0.07)
Basophils Absolute: 0.1 10*3/uL (ref 0.0–0.1)
Basophils Relative: 1 %
Eosinophils Absolute: 0.5 10*3/uL (ref 0.0–0.5)
Eosinophils Relative: 4 %
HCT: 23.8 % — ABNORMAL LOW (ref 36.0–46.0)
Hemoglobin: 7 g/dL — ABNORMAL LOW (ref 12.0–15.0)
Lymphocytes Relative: 14 %
Lymphs Abs: 1.9 10*3/uL (ref 0.7–4.0)
MCH: 19.1 pg — ABNORMAL LOW (ref 26.0–34.0)
MCHC: 29.4 g/dL — ABNORMAL LOW (ref 30.0–36.0)
MCV: 65 fL — ABNORMAL LOW (ref 80.0–100.0)
Monocytes Absolute: 0.7 10*3/uL (ref 0.1–1.0)
Monocytes Relative: 5 %
Neutro Abs: 10.2 10*3/uL — ABNORMAL HIGH (ref 1.7–7.7)
Neutrophils Relative %: 76 %
Platelets: 324 10*3/uL (ref 150–400)
RBC: 3.66 MIL/uL — ABNORMAL LOW (ref 3.87–5.11)
RDW: 22 % — ABNORMAL HIGH (ref 11.5–15.5)
WBC: 13.4 10*3/uL — ABNORMAL HIGH (ref 4.0–10.5)
nRBC: 0.2 % (ref 0.0–0.2)
nRBC: 1 /100{WBCs} — ABNORMAL HIGH

## 2023-09-09 LAB — BASIC METABOLIC PANEL
Anion gap: 6 (ref 5–15)
BUN: 16 mg/dL (ref 8–23)
CO2: 21 mmol/L — ABNORMAL LOW (ref 22–32)
Calcium: 8.4 mg/dL — ABNORMAL LOW (ref 8.9–10.3)
Chloride: 110 mmol/L (ref 98–111)
Creatinine, Ser: 0.6 mg/dL (ref 0.44–1.00)
GFR, Estimated: >60 mL/min (ref >60)
Glucose, Bld: 162 mg/dL — ABNORMAL HIGH (ref 70–99)
Potassium: 3.7 mmol/L (ref 3.5–5.1)
Sodium: 137 mmol/L (ref 135–145)

## 2023-09-09 LAB — CULTURE, BLOOD (ROUTINE X 2)
Culture: NO GROWTH
Culture: NO GROWTH

## 2023-09-09 LAB — GLUCOSE, CAPILLARY
Glucose-Capillary: 166 mg/dL — ABNORMAL HIGH (ref 70–99)
Glucose-Capillary: 214 mg/dL — ABNORMAL HIGH (ref 70–99)
Glucose-Capillary: 151 mg/dL — ABNORMAL HIGH (ref 70–99)
Glucose-Capillary: 166 mg/dL — ABNORMAL HIGH (ref 70–99)

## 2023-09-09 LAB — URINE CULTURE

## 2023-09-09 NOTE — Plan of Care (Signed)

## 2023-09-09 NOTE — Progress Notes (Addendum)
PROGRESS NOTE    Bailey [name]  ZOX:[id number] DOB: [birth year] DOA: 09/04/2023 PCP: Cyndia Skeeters, MD    Chief Complaint  Patient presents with   Seizures    Brief Narrative:  67 y.o. female with medical history significant of HTNM HLD, T2DM, CVA with aphasia sent over from [facility] after pt had tremors that staff thought were seizures.    Staff at facility reported 3 consecutive full body shaking episodes. First one lasted for 30 secs, then 1 min for the second and 2 mins for the third. Pt normally incontinent so unable to tell if she had loss of bowel or bladder control. Pt had only been complaining of right sided facial pain. Pt can only say "sort of sort of" and reportedly rubs her hand around right face and jaw.  No hx of prior seizures and no new medication that family is aware of. Family reported she was somewhat groggy when she was first brought to ED but now back to baseline.   Assessment & Plan:   Principal Problem:   Seizure-like activity (HCC) Active Problems:   Type 2 diabetes mellitus treated without insulin (HCC)   Hyperlipidemia   Essential hypertension   Anemia   SIRS (systemic inflammatory response syndrome) (HCC)   History of CVA (cerebrovascular accident)   #1 seizure-like activity, POA -Patient was admitted with tremors diffusely felt rigors versus true seizures. -CT head done on admission negative, patient with no focal neurological deficits. -Patient underwent EEG that showed cortical dysfunction from the left hemisphere consistent with prior CVA, otherwise no seizures or epileptiform discharges were seen throughout the recording. -Patient noted this morning to have some seizure-like episodes x 2 await left gaze, some facial twitching per RN with some urinary incontinence. -Dr.Chiu discussed with neurology and felt no convincing evidence to start AED now recommended to monitor patient and if patient has another spell then involve  neurology formally for considerations of AED at that time. -Due to seizure-like episodes x 2 the morning of 09/07/2023, neurology formally consulted and patient placed on LTM EEG and started on Keppra empirically.  -Patient underwent MRI brain which showed no acute intracranial abnormality, encephalomalacia and gliosis in the left PCA distribution with chronic petechial blood products and cortical laminar necrosis.  Old left ACA territory infarct.  Chronically abnormal left ICA flow void. -EEG done suggestive of cortical dysfunction arising from the left hemisphere likely secondary to underlying stroke.  No seizures or epileptiform discharges were seen throughout the recording. -IV Ativan as needed. -Neurology recommending continuation of Keppra 500 twice daily. -Outpatient follow-up.  2.  History of CVA -Patient with aphasia secondary to CVA and answers so-so to most questions. -Patient with a right-sided hemiparesis. -Continue aspirin, Brilinta, statin for secondary stroke prophylaxis. -PT/OT.  3.  SIRS, POA -Patient noted to have met criteria with tachycardia, tachypnea and leukocytosis on admission however unclear source. -Urinalysis overall not convincing of UTI. -Patient status post empiric IV Rocephin. -Patient with some complaints of right-sided facial pain with dental caries noted, - Panorex with poor dentition noted. -Currently afebrile. -Placed empirically on IV Unasyn. -Urinalysis and chest x-ray unremarkable.  -On discharge will transition from IV Unasyn to Augmentin to complete a 7 to 10-day course of treatment.  -Will likely need outpatient follow-up with dentist.   4.  Anemia -Patient noted to have a hemoglobin of 10 in March which is slowly trending down to 8.6 in September. -Patient noted with a hemoglobin currently at 7.0.. -Patient with  no overt bleeding. -Anemia panel with iron level of 17, TIBC of 379. -Status post IV iron x 1 09/05/2023. -Discontinue  Xarelto. -Follow H&H. -Transfusion threshhold Hbg < 7.  -Follow.  5.  Hypertension -Norvasc 2.5 mg daily.   6.  Hyperlipidemia -Statin.  7.  Diabetes mellitus type 2 -Hemoglobin A1c 7.0 (09/04/2023) -CBG 166 this morning. -Noted to be on Lantus 30 units daily. -Continue Semglee 20 units daily.  -SSI. -Continue Neurontin.  8.  Leukocytosis -Patient with a worsening leukocytosis, likely secondary to probable seizures.  Patient afebrile. -Leukocytosis trending down. -Blood cultures with no growth to date x 3 days. -Initial urine cultures with Aerococcus urinae. -Repeat UA with trace leukocytes, nitrite negative, 0-5 WBCs.  -Urine cultures with multiple species.  -Chest x-ray unremarkable.  -Patient noted with dental caries and early on in the hospitalization was complaining of dental pain. -Continue empiric IV Unasyn and on discharge, transition to Augmentin and treat for 7 to 10-day course.  Would likely need outpatient follow-up with dentist.  -Repeat labs in the AM.   DVT prophylaxis: On Brilinta/SCDs Code Status: Full Family Communication: No family at bedside. Disposition: Likely back to skilled nursing facility hopefully in the next 24 hours.   Status is: Inpatient    Consultants:  Neurology: Dr. Selina Cooley 09/07/2023  Procedures:  CT head 09/04/2023 Orthopantogram 09/04/2023 Chest x-ray 09/04/2023 EEG 09/04/2023, 09/07/2023  Antimicrobials:  Anti-infectives (From admission, onward)    Start     Dose/Rate Route Frequency Ordered Stop   09/08/23 2200  Ampicillin-Sulbactam (UNASYN) 3 g in sodium chloride 0.9 % 100 mL IVPB        3 g 200 mL/hr over 30 Minutes Intravenous Every 8 hours 09/08/23 1739     09/05/23 1000  cefTRIAXone (ROCEPHIN) 1 g in sodium chloride 0.9 % 100 mL IVPB  Status:  Discontinued        1 g 200 mL/hr over 30 Minutes Intravenous Every 24 hours 09/04/23 2049 09/05/23 0819   09/04/23 1715  cefTRIAXone (ROCEPHIN) 1 g in sodium chloride 0.9 %  100 mL IVPB        1 g 200 mL/hr over 30 Minutes Intravenous  Once 09/04/23 1710 09/04/23 1800         Subjective: Patient sleeping but easily arousable.  No further seizure-like activity noted.  Patient answers so-so to all questions.  Objective: Vitals:   09/08/23 2136 09/08/23 2330 09/09/23 0325 09/09/23 0806  BP: (!) 120/53 (!) 125/52 (!) 143/53 (!) 132/38  Pulse: 94 90 88 94  Resp: 16 16 16 17   Temp: 97.6 F (36.4 C) 97.7 F (36.5 C) 98.3 F (36.8 C) 98 F (36.7 C)  TempSrc: Oral Oral Oral Oral  SpO2: 97% 99% 99% 100%  Weight:      Height:        Intake/Output Summary (Last 24 hours) at 09/09/2023 1158 Last data filed at 09/09/2023 0806 Gross per 24 hour  Intake 420 ml  Output 1150 ml  Net -730 ml   Filed Weights   09/04/23 1525  Weight: 79.4 kg    Examination:  General exam: NAD. Respiratory system: CTAB.  No wheezes, no crackles, no rhonchi.  Fair air movement.  Speaking in full sentences.  Cardiovascular system: RRR no murmurs rubs or gallops.  No JVD.  No lower extremity edema.  Gastrointestinal system: Abdomen is soft, nontender, nondistended, positive bowel sounds.  No rebound.  No guarding. Central nervous system: Alert and oriented. No focal neurological deficits. Extremities: Symmetric  5 x 5 power. Skin: No rashes, lesions or ulcers Psychiatry: Judgement and insight appear poor. Mood & affect appropriate.     Data Reviewed: I have personally reviewed following labs and imaging studies  CBC: Recent Labs  Lab 09/04/23 1610 09/04/23 1637 09/05/23 0721 09/06/23 0600 09/07/23 0526 09/08/23 0553 09/09/23 0627  WBC 12.7*  --  9.5 11.1* 12.5* 16.0* 13.4*  NEUTROABS 9.5*  --   --   --   --  10.8* 10.2*  HGB 8.6*   < > 7.8* 7.8* 7.6* 7.4* 7.0*  HCT 29.0*   < > 26.4* 26.3* 25.4* 25.2* 23.8*  MCV 63.5*  --  62.7* 62.8* 63.0* 63.8* 65.0*  PLT 391  --  364 352 339 319 324   < > = values in this interval not displayed.    Basic Metabolic  Panel: Recent Labs  Lab 09/05/23 0721 09/06/23 0600 09/07/23 0526 09/08/23 0553 09/09/23 0627  NA 138 137 135 136 137  K 4.0 4.0 4.2 3.8 3.7  CL 107 106 106 107 110  CO2 24 23 22  20* 21*  GLUCOSE 179* 183* 202* 182* 162*  BUN 9 9 16 16 16   CREATININE 0.91 0.61 0.70 0.75 0.60  CALCIUM 9.2 9.3 8.8* 8.8* 8.4*  MG  --   --  1.8 1.8  --     GFR: Estimated Creatinine Clearance: 70.1 mL/min (by C-G formula based on SCr of 0.6 mg/dL).  Liver Function Tests: Recent Labs  Lab 09/04/23 1610 09/06/23 0600  AST 24 17  ALT 24 19  ALKPHOS 94 77  BILITOT 0.4 0.3  PROT 7.1 6.6  ALBUMIN 3.2* 3.0*    CBG: Recent Labs  Lab 09/08/23 0859 09/08/23 1223 09/08/23 1625 09/08/23 2130 09/09/23 0714  GLUCAP 181* 194* 188* 225* 166*     Recent Results (from the past 240 hours)  Culture, blood (routine x 2)     Status: None   Collection Time: 09/04/23  4:08 PM   Specimen: BLOOD LEFT FOREARM  Result Value Ref Range Status   Specimen Description BLOOD LEFT FOREARM  Final   Special Requests   Final    BOTTLES DRAWN AEROBIC AND ANAEROBIC Blood Culture results may not be optimal due to an inadequate volume of blood received in culture bottles   Culture   Final    NO GROWTH 5 DAYS Performed at [facility], [address] [place], [place] [address]    Report Status 09/09/2023 FINAL  Final  Culture, blood (routine x 2)     Status: None   Collection Time: 09/04/23  4:09 PM   Specimen: BLOOD  Result Value Ref Range Status   Specimen Description BLOOD SITE NOT SPECIFIED  Final   Special Requests   Final    BOTTLES DRAWN AEROBIC AND ANAEROBIC Blood Culture results may not be optimal due to an inadequate volume of blood received in culture bottles   Culture   Final    NO GROWTH 5 DAYS Performed at [facility], [address] [place], [place] [address]    Report Status 09/09/2023 FINAL  Final  Resp panel by RT-PCR (RSV, Flu A&B, Covid) Anterior Nasal Swab     Status: None    Collection Time: 09/04/23  4:19 PM   Specimen: Anterior Nasal Swab  Result Value Ref Range Status   SARS Coronavirus 2 by RT PCR NEGATIVE NEGATIVE Final   Influenza A by PCR NEGATIVE NEGATIVE Final   Influenza B  by PCR NEGATIVE NEGATIVE Final    Comment: (NOTE) The Xpert Xpress SARS-CoV-2/FLU/RSV plus assay is intended as an aid in the diagnosis of influenza from Nasopharyngeal swab specimens and should not be used as a sole basis for treatment. Nasal washings and aspirates are unacceptable for Xpert Xpress SARS-CoV-2/FLU/RSV testing.  Fact Sheet for Patients: BloggerCourse.com  Fact Sheet for Healthcare Providers: SeriousBroker.it  This test is not yet approved or cleared by the [place] FDA and has been authorized for detection and/or diagnosis of SARS-CoV-2 by FDA under an Emergency Use Authorization (EUA). This EUA will remain in effect (meaning this test can be used) for the duration of the COVID-19 declaration under Section 564(b)(1) of the Act, 21 U.S.C. section 360bbb-3(b)(1), unless the authorization is terminated or revoked.     Resp Syncytial Virus by PCR NEGATIVE NEGATIVE Final    Comment: (NOTE) Fact Sheet for Patients: BloggerCourse.com  Fact Sheet for Healthcare Providers: SeriousBroker.it  This test is not yet approved or cleared by the [place] FDA and has been authorized for detection and/or diagnosis of SARS-CoV-2 by FDA under an Emergency Use Authorization (EUA). This EUA will remain in effect (meaning this test can be used) for the duration of the COVID-19 declaration under Section 564(b)(1) of the Act, 21 U.S.C. section 360bbb-3(b)(1), unless the authorization is terminated or revoked.  Performed at [facility], [address], [place], [place] [address]   Urine Culture (for pregnant, neutropenic or urologic patients or patients  with an indwelling urinary catheter)     Status: Abnormal   Collection Time: 09/04/23  8:27 PM   Specimen: Urine, Random  Result Value Ref Range Status   Specimen Description URINE, CLEAN CATCH  Final   Special Requests NONE  Final   Culture (A)  Final    >=100,000 COLONIES/mL AEROCOCCUS URINAE Standardized susceptibility testing for this organism is not available. Performed at [facility], [address] [place], [place] [address]    Report Status 09/07/2023 FINAL  Final         Radiology Studies: MR BRAIN W WO CONTRAST Result Date: 09/09/2023 CLINICAL DATA:  Seizure disorder EXAM: MRI HEAD WITHOUT AND WITH CONTRAST TECHNIQUE: Multiplanar, multiecho pulse sequences of the brain and surrounding structures were obtained without and with intravenous contrast. CONTRAST:  7.15mL GADAVIST GADOBUTROL 1 MMOL/ML IV SOLN COMPARISON:  05/16/2022 FINDINGS: Brain: No acute infarct, mass effect or extra-axial collection. Chronic microhemorrhage in the dorsal pons. Encephalomalacia and gliosis in the left PCA distribution with chronic petechial blood products and cortical laminar necrosis. There is multifocal hyperintense T2-weighted signal within the white matter. Generalized volume loss. Old left ACA territory infarct. The midline structures are normal. Vascular: Chronically abnormal left ICA flow void. Skull and upper cervical spine: Normal calvarium and skull base. Visualized upper cervical spine and soft tissues are normal. Sinuses/Orbits:No paranasal sinus fluid levels or advanced mucosal thickening. Bilateral mastoid fluid. Nasopharynx is clear. Normal orbits. IMPRESSION: 1. No acute intracranial abnormality. 2. Encephalomalacia and gliosis in the left PCA distribution with chronic petechial blood products and cortical laminar necrosis. Old left ACA territory infarct. 3. Chronically abnormal left ICA flow void. Electronically Signed   By: Deatra Robinson M.D.   On: 09/09/2023 00:32   DG CHEST  PORT 1 VIEW Result Date: 09/08/2023 CLINICAL DATA:  Leukocytosis EXAM: PORTABLE CHEST 1 VIEW COMPARISON:  X-ray 09/04/2023.  Older examinations FINDINGS: No consolidation, pneumothorax or effusion. No edema. Normal cardiopericardial silhouette. Interstitial changes. Calcified tortuous aorta. Overlapping  cardiac leads. Surgical clips in the right upper quadrant. IMPRESSION: No acute cardiopulmonary disease.  Chronic changes. Electronically Signed   By: Karen Kays M.D.   On: 09/08/2023 13:16   Overnight EEG with video Result Date: 09/08/2023 Charlsie Quest, MD     09/09/2023  6:18 AM Patient Name: Bailey [name] MRN: [id number] Epilepsy Attending: Charlsie Quest Referring Physician/Provider: Marjorie Smolder, NP Duration: 09/07/2023 1557 to 1344 Patient history: 67 year old female with seizure-like episode described as whole body shaking.  EEG to evaluate for seizure. Level of alertness: Awake, asleep AEDs during EEG study: Gabapentin, Keppra Technical aspects: This EEG study was done with scalp electrodes positioned according to the 10-20 International system of electrode placement. Electrical activity was reviewed with band pass filter of 1-70Hz , sensitivity of 7 uV/mm, display speed of 71mm/sec with a 60Hz  notched filter applied as appropriate. EEG data were recorded continuously and digitally stored.  Video monitoring was available and reviewed as appropriate. Description: The posterior dominant rhythm consists of 9 Hz activity of moderate voltage (25-35 uV) seen predominantly in posterior head regions, asymmetric ( left<right) and reactive to eye opening and eye closing. Sleep was characterized by vertex waves, sleep spindles (12 to 14 Hz), maximal frontocentral region. EEG showed continuous 3 to 5 Hz theta-delta slowing in left hemisphere. Hyperventilation and photic stimulation were not performed.    ABNORMALITY - Continuous slow,  left hemisphere  IMPRESSION: This study is suggestive of  cortical dysfunction arising from left hemisphere likely secondary to underlying stroke. No seizures or epileptiform discharges were seen throughout the recording.  Priyanka Annabelle Harman          Scheduled Meds:  amLODipine  2.5 mg Oral Daily   aspirin  81 mg Oral Daily   atorvastatin  40 mg Oral QHS   cholecalciferol  1,000 Units Oral QHS   gabapentin  100 mg Oral QHS   insulin aspart  0-15 Units Subcutaneous TID PC & HS   insulin glargine-yfgn  20 Units Subcutaneous Daily   levETIRAcetam  500 mg Oral BID   omega-3 acid ethyl esters  1 g Oral QHS   oxybutynin  5 mg Oral Daily   pantoprazole  40 mg Oral BID   rivaroxaban  10 mg Oral Daily   senna-docusate  2 tablet Oral QHS   sertraline  100 mg Oral Daily   ticagrelor  90 mg Oral BID   Continuous Infusions:  ampicillin-sulbactam (UNASYN) IV 3 g (09/09/23 0505)     LOS: 2 days    Time spent: 35 minutes    Ramiro Harvest, MD [facility]   To contact the attending provider between 7A-7P or the covering provider during after hours 7P-7A, please log into the web site [email or web address] and access using universal [place] password for that web site. If you do not have the password, please call the hospital operator.  09/09/2023, 11:58 AM

## 2023-09-09 NOTE — TOC CM/SW Note (Signed)
Transition of Care [facility]) - Inpatient Brief Assessment   Patient Details  Name: Bailey [name] MRN: [id number] Date of Birth: [birth year]  Transition of Care [name]) CM/SW Contact:    Baldemar Lenis, LCSW Phone Number: 09/09/2023, 1:17 PM   Clinical Narrative:  Patient from [facility], CSW confirmed that patient can return when stable. CSW to follow.    Transition of Care Asessment: Insurance and Status: Insurance coverage has been reviewed Patient has primary care physician: Yes Home environment has been reviewed: [facility] Prior level of function:: Needs assist Prior/Current Home Services: No current home services Social Drivers of Health Review: SDOH reviewed no interventions necessary Readmission risk has been reviewed: Yes Transition of care needs: transition of care needs identified, TOC will continue to follow

## 2023-09-09 NOTE — Progress Notes (Addendum)
Occupational Therapy Treatment Patient Details Name: Bailey [name] MRN: [id number] DOB: [birth year] Today's Date: 09/09/2023   History of present illness 66 y.o. female sent over from [facility] after pt had tremors that staff thought were seizures. EEG negative for seizure PMH includes: HTN, HLD, DM II, L MCA CVA 04/2022 with aphasia, and tobacco use.   OT comments  Pt seen for palm guard check. Tolerating without issues.   Recommend pt wear palm guard at all times. Nsg to remove palm guard every shift to complete hand hygiene and check hand for any pressure areas.  Plan is for pt to return to SNF. Will follow up next week.       If plan is discharge home, recommend the following:  Two people to help with walking and/or transfers;A lot of help with bathing/dressing/bathroom;Assistance with cooking/housework;Direct supervision/assist for medications management;Direct supervision/assist for financial management;Assist for transportation;Help with stairs or ramp for entrance   Equipment Recommendations  Other (comment)    Recommendations for Other Services      Precautions / Restrictions Precautions Precautions: Fall;Other (comment) Precaution Comments: R hemiparesis,  R palm guard for comfort Restrictions Weight Bearing Restrictions Per Provider Order: No       Mobility Bed Mobility                    Transfers                         Balance                                           ADL either performed or assessed with clinical judgement   ADL                                              Extremity/Trunk Assessment              Vision       Perception     Praxis      Cognition Arousal: Alert Behavior During Therapy: [place] for tasks assessed/performed Overall Cognitive Status: Difficult to assess                                          Exercises      Shoulder  Instructions       General Comments Pt seen for palmguard check. Tolerating without issues. Using palm guard due to R hand contracture and fingernails at risk of damaging palm.    Pertinent Vitals/ Pain       Pain Assessment Pain Assessment: Faces Faces Pain Scale: No hurt  Home Living                                          Prior Functioning/Environment              Frequency  Min 1X/week        Progress Toward Goals  OT Goals(current goals can now be found in the care plan section)  Progress towards OT goals:  Goals updated  Acute Rehab OT Goals Patient Stated Goal: unable to state OT Goal Formulation: With patient Time For Goal Achievement: 09/20/23 Potential to Achieve Goals: Good ADL Goals Pt Will Perform Upper Body Dressing: with min assist;sitting Pt Will Perform Lower Body Dressing: with mod assist;sit to/from stand;sitting/lateral leans Pt Will Transfer to Toilet: with min assist;bedside commode;stand pivot transfer;squat pivot transfer Additional ADL Goal #1: pt will perform bed mobility min A in prep for ADLs Additional ADL Goal #2: Pt will tolerate R palm guard per schedule to protect palm from fingernails and improve hand hygiene  Plan      Co-evaluation                 AM-PAC OT "6 Clicks" Daily Activity     Outcome Measure   Help from another person eating meals?: A Lot Help from another person taking care of personal grooming?: A Lot Help from another person toileting, which includes using toliet, bedpan, or urinal?: A Lot Help from another person bathing (including washing, rinsing, drying)?: A Lot Help from another person to put on and taking off regular upper body clothing?: A Lot Help from another person to put on and taking off regular lower body clothing?: A Lot 6 Click Score: 12    End of Session    OT Visit Diagnosis: Unsteadiness on feet (R26.81);Other abnormalities of gait and mobility (R26.89);Muscle  weakness (generalized) (M62.81);Hemiplegia and hemiparesis Hemiplegia - Right/Left: Right Hemiplegia - caused by: Cerebral infarction   Activity Tolerance Patient tolerated treatment well   Patient Left in bed;with call bell/phone within reach;with bed alarm set   Nurse Communication Other (comment) (use of palm guard)        Time: 4098-1191 OT Time Calculation (min): 10 min  Charges: OT General Charges $OT Visit: 1 Visit OT Treatments $Orthotics/Prosthetics Check: 8-22 mins  Luisa Dago, OT/L   Acute OT Clinical Specialist Acute Rehabilitation Services Pager [phone] Office [phone]   [facility] 09/09/2023, 10:14 AM

## 2023-09-09 NOTE — Patient Instructions (Signed)
  Please remove palm guard at least 1x/shift to complete hand hygiene and check skin for any pressure areas.

## 2023-09-10 DIAGNOSIS — R569 Unspecified convulsions: Secondary | ICD-10-CM | POA: Diagnosis not present

## 2023-09-10 LAB — BASIC METABOLIC PANEL
Anion gap: 11 (ref 5–15)
BUN: 16 mg/dL (ref 8–23)
CO2: 19 mmol/L — ABNORMAL LOW (ref 22–32)
Calcium: 8.8 mg/dL — ABNORMAL LOW (ref 8.9–10.3)
Chloride: 109 mmol/L (ref 98–111)
Creatinine, Ser: 0.62 mg/dL (ref 0.44–1.00)
GFR, Estimated: >60 mL/min (ref >60)
Glucose, Bld: 163 mg/dL — ABNORMAL HIGH (ref 70–99)
Potassium: 3.8 mmol/L (ref 3.5–5.1)
Sodium: 139 mmol/L (ref 135–145)

## 2023-09-10 LAB — ABO/RH: ABO/RH(D): B POS

## 2023-09-10 LAB — GLUCOSE, CAPILLARY
Glucose-Capillary: 155 mg/dL — ABNORMAL HIGH (ref 70–99)
Glucose-Capillary: 177 mg/dL — ABNORMAL HIGH (ref 70–99)

## 2023-09-10 LAB — CBC
HCT: 27.1 % — ABNORMAL LOW (ref 36.0–46.0)
HCT: 26.6 % — ABNORMAL LOW (ref 36.0–46.0)
Hemoglobin: 8.1 g/dL — ABNORMAL LOW (ref 12.0–15.0)
Hemoglobin: 8 g/dL — ABNORMAL LOW (ref 12.0–15.0)
MCH: 19.6 pg — ABNORMAL LOW (ref 26.0–34.0)
MCH: 19.7 pg — ABNORMAL LOW (ref 26.0–34.0)
MCHC: 29.9 g/dL — ABNORMAL LOW (ref 30.0–36.0)
MCHC: 30.1 g/dL (ref 30.0–36.0)
MCV: 65.6 fL — ABNORMAL LOW (ref 80.0–100.0)
MCV: 65.4 fL — ABNORMAL LOW (ref 80.0–100.0)
Platelets: 329 10*3/uL (ref 150–400)
Platelets: 315 10*3/uL (ref 150–400)
RBC: 4.13 MIL/uL (ref 3.87–5.11)
RBC: 4.07 MIL/uL (ref 3.87–5.11)
RDW: 21.5 % — ABNORMAL HIGH (ref 11.5–15.5)
RDW: 21.7 % — ABNORMAL HIGH (ref 11.5–15.5)
WBC: 13.4 10*3/uL — ABNORMAL HIGH (ref 4.0–10.5)
WBC: 13.2 10*3/uL — ABNORMAL HIGH (ref 4.0–10.5)
nRBC: 0.7 % — ABNORMAL HIGH (ref 0.0–0.2)
nRBC: 0.8 % — ABNORMAL HIGH (ref 0.0–0.2)

## 2023-09-10 LAB — CBC WITH DIFFERENTIAL/PLATELET
Abs Immature Granulocytes: 0.11 10*3/uL — ABNORMAL HIGH (ref 0.00–0.07)
Basophils Absolute: 0.1 10*3/uL (ref 0.0–0.1)
Basophils Relative: 0 %
Eosinophils Absolute: 0.5 10*3/uL (ref 0.0–0.5)
Eosinophils Relative: 4 %
HCT: 22.1 % — ABNORMAL LOW (ref 36.0–46.0)
Hemoglobin: 6.5 g/dL — CL (ref 12.0–15.0)
Immature Granulocytes: 1 %
Lymphocytes Relative: 23 %
Lymphs Abs: 2.9 10*3/uL (ref 0.7–4.0)
MCH: 19 pg — ABNORMAL LOW (ref 26.0–34.0)
MCHC: 29.4 g/dL — ABNORMAL LOW (ref 30.0–36.0)
MCV: 64.6 fL — ABNORMAL LOW (ref 80.0–100.0)
Monocytes Absolute: 1.1 10*3/uL — ABNORMAL HIGH (ref 0.1–1.0)
Monocytes Relative: 8 %
Neutro Abs: 8 10*3/uL — ABNORMAL HIGH (ref 1.7–7.7)
Neutrophils Relative %: 64 %
Platelets: 314 10*3/uL (ref 150–400)
RBC: 3.42 MIL/uL — ABNORMAL LOW (ref 3.87–5.11)
RDW: 22.2 % — ABNORMAL HIGH (ref 11.5–15.5)
WBC: 12.5 10*3/uL — ABNORMAL HIGH (ref 4.0–10.5)
nRBC: 0.5 % — ABNORMAL HIGH (ref 0.0–0.2)

## 2023-09-10 LAB — RETICULOCYTES
Immature Retic Fract: 40.3 % — ABNORMAL HIGH (ref 2.3–15.9)
RBC.: 4.11 MIL/uL (ref 3.87–5.11)
Retic Count, Absolute: 89.2 10*3/uL (ref 19.0–186.0)
Retic Ct Pct: 2.2 % (ref 0.4–3.1)

## 2023-09-10 LAB — IRON AND TIBC
Iron: 108 ug/dL (ref 28–170)
Saturation Ratios: 29 % (ref 10.4–31.8)
TIBC: 378 ug/dL (ref 250–450)
UIBC: 270 ug/dL

## 2023-09-10 LAB — PREPARE RBC (CROSSMATCH)

## 2023-09-10 LAB — VITAMIN B12: Vitamin B-12: 536 pg/mL (ref 180–914)

## 2023-09-10 LAB — FERRITIN: Ferritin: 40 ng/mL (ref 11–307)

## 2023-09-10 LAB — FOLATE: Folate: 7.3 ng/mL (ref >5.9)

## 2023-09-10 MED ORDER — ACETAMINOPHEN 325 MG PO TABS: 650.0000 mg | ORAL_TABLET | Freq: Four times a day (QID) | ORAL | Status: DC | PRN

## 2023-09-10 MED ORDER — FERROUS SULFATE 325 (65 FE) MG PO TBEC: 325.0000 mg | DELAYED_RELEASE_TABLET | Freq: Every day | ORAL | Status: AC

## 2023-09-10 MED ORDER — SODIUM CHLORIDE 0.9% IV SOLUTION: Freq: Once | INTRAVENOUS | Status: AC

## 2023-09-10 MED ORDER — FERROUS SULFATE 325 (65 FE) MG PO TBEC: 325.0000 mg | DELAYED_RELEASE_TABLET | Freq: Three times a day (TID) | ORAL | Status: DC

## 2023-09-10 MED ORDER — AMOXICILLIN-POT CLAVULANATE 875-125 MG PO TABS
1.0000 | ORAL_TABLET | Freq: Two times a day (BID) | ORAL | Status: DC
  Administered 2023-09-10: 1 via ORAL
  Filled 2023-09-10: qty 1

## 2023-09-10 MED ORDER — AMOXICILLIN-POT CLAVULANATE 875-125 MG PO TABS: 1.0000 | ORAL_TABLET | Freq: Two times a day (BID) | ORAL | Status: AC

## 2023-09-10 MED ORDER — ALENDRONATE SODIUM 70 MG PO TABS: 70.0000 mg | ORAL_TABLET | ORAL | Status: AC

## 2023-09-10 MED ORDER — LEVETIRACETAM 500 MG PO TABS: 500.0000 mg | ORAL_TABLET | Freq: Two times a day (BID) | ORAL | Status: DC

## 2023-09-10 NOTE — Care Management Important Message (Signed)
Important Message  Patient Details  Name: Bailey [name] MRN: [id number] Date of Birth: 08-Oct-1955   Important Message Given:  Yes - Medicare IM     Bailey [name] 09/10/2023, 2:53 PM

## 2023-09-10 NOTE — Plan of Care (Signed)
Problem: Education: Goal: Ability to describe self-care measures that may prevent or decrease complications (Diabetes Survival Skills Education) will improve 09/10/2023 1211 by Laurence Aly, RN Outcome: Adequate for Discharge 09/10/2023 1205 by Laurence Aly, RN Outcome: Progressing Goal: Individualized Educational Video(s) 09/10/2023 1211 by Laurence Aly, RN Outcome: Adequate for Discharge 09/10/2023 1205 by Laurence Aly, RN Outcome: Progressing   Problem: Coping: Goal: Ability to adjust to condition or change in health will improve 09/10/2023 1211 by Laurence Aly, RN Outcome: Adequate for Discharge 09/10/2023 1205 by Laurence Aly, RN Outcome: Progressing   Problem: Fluid Volume: Goal: Ability to maintain a balanced intake and output will improve 09/10/2023 1211 by Laurence Aly, RN Outcome: Adequate for Discharge 09/10/2023 1205 by Laurence Aly, RN Outcome: Progressing   Problem: Health Behavior/Discharge Planning: Goal: Ability to identify and utilize available resources and services will improve 09/10/2023 1211 by Laurence Aly, RN Outcome: Adequate for Discharge 09/10/2023 1205 by Laurence Aly, RN Outcome: Progressing Goal: Ability to manage health-related needs will improve 09/10/2023 1211 by Laurence Aly, RN Outcome: Adequate for Discharge 09/10/2023 1205 by Laurence Aly, RN Outcome: Progressing   Problem: Metabolic: Goal: Ability to maintain appropriate glucose levels will improve 09/10/2023 1211 by Laurence Aly, RN Outcome: Adequate for Discharge 09/10/2023 1205 by Laurence Aly, RN Outcome: Progressing   Problem: Nutritional: Goal: Maintenance of adequate nutrition will improve 09/10/2023 1211 by Laurence Aly, RN Outcome: Adequate for Discharge 09/10/2023 1205 by Laurence Aly, RN Outcome: Progressing Goal: Progress toward achieving  an optimal weight will improve 09/10/2023 1211 by Laurence Aly, RN Outcome: Adequate for Discharge 09/10/2023 1205 by Laurence Aly, RN Outcome: Progressing   Problem: Skin Integrity: Goal: Risk for impaired skin integrity will decrease 09/10/2023 1211 by Laurence Aly, RN Outcome: Adequate for Discharge 09/10/2023 1205 by Laurence Aly, RN Outcome: Progressing   Problem: Tissue Perfusion: Goal: Adequacy of tissue perfusion will improve 09/10/2023 1211 by Laurence Aly, RN Outcome: Adequate for Discharge 09/10/2023 1205 by Laurence Aly, RN Outcome: Progressing   Problem: Education: Goal: Knowledge of General Education information will improve Description: Including pain rating scale, medication(s)/side effects and non-pharmacologic comfort measures 09/10/2023 1211 by Laurence Aly, RN Outcome: Adequate for Discharge 09/10/2023 1205 by Laurence Aly, RN Outcome: Progressing   Problem: Health Behavior/Discharge Planning: Goal: Ability to manage health-related needs will improve 09/10/2023 1211 by Laurence Aly, RN Outcome: Adequate for Discharge 09/10/2023 1205 by Laurence Aly, RN Outcome: Progressing   Problem: Clinical Measurements: Goal: Ability to maintain clinical measurements within normal limits will improve 09/10/2023 1211 by Laurence Aly, RN Outcome: Adequate for Discharge 09/10/2023 1205 by Laurence Aly, RN Outcome: Progressing Goal: Will remain free from infection 09/10/2023 1211 by Laurence Aly, RN Outcome: Adequate for Discharge 09/10/2023 1205 by Laurence Aly, RN Outcome: Progressing Goal: Diagnostic test results will improve 09/10/2023 1211 by Laurence Aly, RN Outcome: Adequate for Discharge 09/10/2023 1205 by Laurence Aly, RN Outcome: Progressing Goal: Respiratory complications will improve 09/10/2023 1211 by Laurence Aly,  RN Outcome: Adequate for Discharge 09/10/2023 1205 by Laurence Aly, RN Outcome: Progressing Goal: Cardiovascular complication will be avoided 09/10/2023 1211 by Laurence Aly, RN  Outcome: Adequate for Discharge 09/10/2023 1205 by Laurence Aly, RN Outcome: Progressing   Problem: Activity: Goal: Risk for activity intolerance will decrease 09/10/2023 1211 by Laurence Aly, RN Outcome: Adequate for Discharge 09/10/2023 1205 by Laurence Aly, RN Outcome: Progressing   Problem: Nutrition: Goal: Adequate nutrition will be maintained 09/10/2023 1211 by Laurence Aly, RN Outcome: Adequate for Discharge 09/10/2023 1205 by Laurence Aly, RN Outcome: Progressing   Problem: Coping: Goal: Level of anxiety will decrease 09/10/2023 1211 by Laurence Aly, RN Outcome: Adequate for Discharge 09/10/2023 1205 by Laurence Aly, RN Outcome: Progressing   Problem: Elimination: Goal: Will not experience complications related to bowel motility 09/10/2023 1211 by Laurence Aly, RN Outcome: Adequate for Discharge 09/10/2023 1205 by Laurence Aly, RN Outcome: Progressing Goal: Will not experience complications related to urinary retention 09/10/2023 1211 by Laurence Aly, RN Outcome: Adequate for Discharge 09/10/2023 1205 by Laurence Aly, RN Outcome: Progressing   Problem: Pain Management: Goal: General experience of comfort will improve 09/10/2023 1211 by Laurence Aly, RN Outcome: Adequate for Discharge 09/10/2023 1205 by Laurence Aly, RN Outcome: Progressing   Problem: Safety: Goal: Ability to remain free from injury will improve 09/10/2023 1211 by Laurence Aly, RN Outcome: Adequate for Discharge 09/10/2023 1205 by Laurence Aly, RN Outcome: Progressing   Problem: Skin Integrity: Goal: Risk for impaired skin integrity will decrease 09/10/2023 1211 by Laurence Aly, RN Outcome: Adequate for Discharge 09/10/2023 1205 by Laurence Aly, RN Outcome: Progressing

## 2023-09-10 NOTE — Plan of Care (Signed)

## 2023-09-10 NOTE — TOC Transition Note (Signed)
Transition of Care [facility]) - Discharge Note   Patient Details  Name: Bailey [name] MRN: [id number] Date of Birth: 28-Nov-1955  Transition of Care [facility]) CM/SW Contact:  Erin Sons, LCSW Phone Number: 09/10/2023, 12:18 PM   Clinical Narrative:     Per MD patient ready for DC to [facility] healthcare. RN, patient, and facility notified of DC. Discharge Summary and FL2 sent to facility. RN to call report prior to discharge [phone]). DC packet on chart. Ambulance transport requested for patient.   CSW will sign off for now as social work intervention is no longer needed. Please consult [place] again if new needs arise.   Final next level of care: Skilled Nursing Facility Barriers to Discharge: No Barriers Identified       Discharge Placement               [facility]        Discharge Plan and Services Additional resources added to the After Visit Summary for                                       Social Drivers of Health (SDOH) Interventions SDOH Screenings   Food Insecurity: No Food Insecurity (10/23/2021)  Housing: Low Risk  (10/23/2021)  Transportation Needs: No Transportation Needs (10/23/2021)  Alcohol Screen: Low Risk  (10/23/2021)  Depression (PHQ2-9): Low Risk  (10/23/2021)  Financial Resource Strain: Low Risk  (10/23/2021)  Physical Activity: Inactive (10/23/2021)  Social Connections: Moderately Integrated (10/23/2021)  Recent Concern: Social Connections - Moderately Isolated (09/25/2021)  Stress: Stress Concern Present (10/23/2021)  Tobacco Use: High Risk (09/04/2023)     Readmission Risk Interventions     No data to display

## 2023-09-10 NOTE — Plan of Care (Signed)
Received her blood transfusion without complication, sitting on bed without complains. Waiting to be dicharge to SNF.  Problem: Health Behavior/Discharge Planning: Goal: Ability to manage health-related needs will improve Outcome: Progressing   Problem: Nutritional: Goal: Maintenance of adequate nutrition will improve Outcome: Progressing   Problem: Tissue Perfusion: Goal: Adequacy of tissue perfusion will improve Outcome: Progressing   Problem: Activity: Goal: Risk for activity intolerance will decrease Outcome: Progressing   Problem: Safety: Goal: Ability to remain free from injury will improve Outcome: Progressing

## 2023-09-10 NOTE — Discharge Summary (Addendum)
DISCHARGE SUMMARY  Bailey [name]  MR#: [id number]  DOB:Jan 21, 1956  Date of Admission: 09/04/2023 Date of Discharge: 09/10/2023  Attending Physician:Keneshia Tena Silvestre Gunner, MD  Patient's MWU:XLKG, Ivor Costa, MD  Disposition: D/C to SNF   Follow-up Appts:  Follow-up Information     Cyndia Skeeters, MD Follow up.   Specialty: Family Medicine Why: Patient should be referred to a GI clinic for evaluation of iron deficiency anemia in setting of ASA and Brilinta use. Contact information: [address] [place] [address] [phone]                 Tests Needing Follow-up: -recheck of Hgb is suggested in 3-5 days -outpt eval for occult GI blood loss is required  -assess for tolerance of Fe therapy  -suggest outpatient follow-up with dentist for dental carries   Discharge Diagnoses: Seizure-like activity History of CVA Concern for SIRS at admission -ruled out Chronic progressive Fe deficiency anemia Dental caries -apparent dental pain HTN HLD DM2   Initial presentation: 67 year old SNF resident with a history of HTN, HLD, DM2, and CVA with chronic aphasia who was sent to the ER from her SNF 12/24 due to "tremors" described as 3 consecutive episodes of full body shaking, lasting a maximum of 2 minutes for the final episode.  She had no prior history of seizures.  She had not been placed on any new medications.  Hospital Course:  Seizure-like activity CT head negative -Spot EEG noted cortical dysfunction from the left hemisphere consistent with her prior CVA but revealed no evidence of seizure/epileptiform discharges -she did have recurrent episodes during her hospital stay, prompting initiation of long-term EEG and empiric Keppra initiation -long-term EEG did not capture clear seizure activity - MRI brain was accomplished which revealed no acute intracranial abnormality - Neurology ultimately suggested continuing Keppra at 500 mg twice daily  empirically  History of CVA Chronic aphasia with typical response to most questions being "so-so" -has chronic right sided hemiparesis as well - has complex hx of intervention w/ subsequent reocclusion - was supposed to stop Brilinta ~ Feb 2024, and continue on ASA alone - Brilinta has therefore been stopped permanently this admit, and is ASA presently on hold due to progressive Fe def anemia   Concern for SIRS at admission -ruled out UA not consistent with UTI - Panorex did suggest the possibility of dental caries for which the patient was treated with IV Unasyn - chest x-ray unremarkable - clinically however her picture was not consistent with true clinical sepsis/SIRS  Chronic progressive Fe deficiency anemia Patient was noted to have a hemoglobin of 10 in March 2024 which had trended down to 8.6 in September 2024 and further down to 7.0 during this admission - there was no evidence of gross large scale blood loss during this hospital stay - anemia panel confirmed significant iron deficiency and the patient was dosed with IV iron - Brilinta stopped for good as noted above - ASA on hold until GI workup completed or Hgb proves to be stable - will need outpt referral for Gi eval if patient has not had a recent colonoscopy/EGD - cont empiric PPI for now for empiric tx of possible ASA/Brilinta associated gastritis/UGIB  Recent Labs  Lab 09/07/23 0526 09/08/23 0553 09/09/23 0627 09/10/23 0354 09/10/23 1019  HGB 7.6* 7.4* 7.0* 6.5* 8.1*    Dental caries -apparent dental pain Treat with Augmentin x 7 days - suggest outpatient follow-up with dentist  HTN Currently well-controlled  HLD Continue usual statin dose  DM2 A1c 7.0 -CBG well-controlled on current regimen   Allergies as of 09/10/2023       Reactions   Beef-derived Drug Products    Unknown reaction   Pork-derived Products    Unknown reaction        Medication List     STOP taking these medications    aspirin 81 MG  chewable tablet   metoCLOPramide 5 MG tablet Commonly known as: REGLAN   ticagrelor 90 MG Tabs tablet Commonly known as: BRILINTA       TAKE these medications    acetaminophen 325 MG tablet Commonly known as: TYLENOL Take 2 tablets (650 mg total) by mouth every 6 (six) hours as needed (pain). What changed:  medication strength how much to take additional instructions   alendronate 70 MG tablet Commonly known as: FOSAMAX Take 1 tablet (70 mg total) by mouth every 7 (seven) days. Take with a full glass of water on an empty stomach. Start taking on: October 25, 2023 What changed: These instructions start on October 25, 2023. If you are unsure what to do until then, ask your doctor or other care provider.   amLODipine 2.5 MG tablet Commonly known as: NORVASC Take 2.5 mg by mouth daily.   amoxicillin-clavulanate 875-125 MG tablet Commonly known as: AUGMENTIN Take 1 tablet by mouth every 12 (twelve) hours for 5 days.   atorvastatin 40 MG tablet Commonly known as: LIPITOR TAKE 1 TABLET EVERY DAY   cholecalciferol 25 MCG (1000 UNIT) tablet Commonly known as: VITAMIN D3 Take 1,000 Units by mouth at bedtime.   ferrous sulfate 325 (65 FE) MG EC tablet Take 1 tablet (325 mg total) by mouth daily with breakfast.   gabapentin 100 MG capsule Commonly known as: NEURONTIN Take 100 mg by mouth at bedtime.   Lantus 100 UNIT/ML injection Generic drug: insulin glargine Inject 28 Units into the skin daily.   levETIRAcetam 500 MG tablet Commonly known as: KEPPRA Take 1 tablet (500 mg total) by mouth 2 (two) times daily.   multivitamin with minerals Tabs tablet Take 1 tablet by mouth daily.   Omega 3 1000 MG Caps Take 1 capsule by mouth at bedtime.   oxybutynin 5 MG 24 hr tablet Commonly known as: DITROPAN-XL Take 5 mg by mouth daily.   pantoprazole 40 MG tablet Commonly known as: PROTONIX Take 1 tablet (40 mg total) by mouth 2 (two) times daily.   senna-docusate  8.6-50 MG tablet Commonly known as: Senokot-S Take 2 tablets by mouth at bedtime.   sertraline 100 MG tablet Commonly known as: ZOLOFT Take 100 mg by mouth daily.        Day of Discharge BP (!) 145/57 (BP Location: Left Arm)   Pulse 86   Temp 98 F (36.7 C) (Oral)   Resp 18   Ht 5\' 5"  (1.651 m)   Wt 79.4 kg   SpO2 100%   BMI 29.13 kg/m   Physical Exam: General: No acute respiratory distress Lungs: Clear to auscultation bilaterally without wheezes or crackles Cardiovascular: Regular rate and rhythm without murmur gallop or rub normal S1 and S2 Abdomen: Nontender, nondistended, soft, bowel sounds positive, no rebound, no ascites, no appreciable mass Extremities: No significant cyanosis, clubbing, or edema bilateral lower extremities  Basic Metabolic Panel: Recent Labs  Lab 09/06/23 0600 09/07/23 0526 09/08/23 0553 09/09/23 0627 09/10/23 0354  NA 137 135 136 137 139  K 4.0 4.2 3.8 3.7 3.8  CL 106 106  107 110 109  CO2 23 22 20* 21* 19*  GLUCOSE 183* 202* 182* 162* 163*  BUN 9 16 16 16 16   CREATININE 0.61 0.70 0.75 0.60 0.62  CALCIUM 9.3 8.8* 8.8* 8.4* 8.8*  MG  --  1.8 1.8  --   --     CBC: Recent Labs  Lab 09/04/23 1610 09/04/23 1637 09/07/23 0526 09/08/23 0553 09/09/23 0627 09/10/23 0354 09/10/23 1019  WBC 12.7*   < > 12.5* 16.0* 13.4* 12.5* 13.4*  NEUTROABS 9.5*  --   --  10.8* 10.2* 8.0*  --   HGB 8.6*   < > 7.6* 7.4* 7.0* 6.5* 8.1*  HCT 29.0*   < > 25.4* 25.2* 23.8* 22.1* 27.1*  MCV 63.5*   < > 63.0* 63.8* 65.0* 64.6* 65.6*  PLT 391   < > 339 319 324 314 329   < > = values in this interval not displayed.    Time spent in discharge (includes decision making & examination of pt): 35 minutes  09/10/2023, 11:51 AM   Lonia Blood, MD [facility] Office  [phone]

## 2023-09-10 NOTE — Final Progress Note (Signed)
Discharged to [facility], accompanied by [facility]. NO complaint prior to discharge.

## 2023-09-11 DIAGNOSIS — I69951 Hemiplegia and hemiparesis following unspecified cerebrovascular disease affecting right dominant side: Secondary | ICD-10-CM | POA: Diagnosis not present

## 2023-09-11 DIAGNOSIS — E1165 Type 2 diabetes mellitus with hyperglycemia: Secondary | ICD-10-CM | POA: Diagnosis not present

## 2023-09-11 DIAGNOSIS — R569 Unspecified convulsions: Secondary | ICD-10-CM | POA: Diagnosis not present

## 2023-09-11 DIAGNOSIS — R197 Diarrhea, unspecified: Secondary | ICD-10-CM | POA: Diagnosis not present

## 2023-09-11 DIAGNOSIS — J449 Chronic obstructive pulmonary disease, unspecified: Secondary | ICD-10-CM | POA: Diagnosis not present

## 2023-09-11 DIAGNOSIS — I69320 Aphasia following cerebral infarction: Secondary | ICD-10-CM | POA: Diagnosis not present

## 2023-09-11 DIAGNOSIS — F411 Generalized anxiety disorder: Secondary | ICD-10-CM | POA: Diagnosis not present

## 2023-09-11 LAB — TYPE AND SCREEN
ABO/RH(D): B POS
Antibody Screen: NEGATIVE
Unit division: 0

## 2023-09-11 LAB — BPAM RBC
Blood Product Expiration Date: 202412252359
ISSUE DATE / TIME: 202412200643
Unit Type and Rh: 7300

## 2023-09-13 DIAGNOSIS — D649 Anemia, unspecified: Secondary | ICD-10-CM | POA: Diagnosis not present

## 2023-09-13 DIAGNOSIS — I10 Essential (primary) hypertension: Secondary | ICD-10-CM | POA: Diagnosis not present

## 2023-09-13 DIAGNOSIS — E118 Type 2 diabetes mellitus with unspecified complications: Secondary | ICD-10-CM | POA: Diagnosis not present

## 2023-09-13 DIAGNOSIS — E785 Hyperlipidemia, unspecified: Secondary | ICD-10-CM | POA: Diagnosis not present

## 2023-09-14 DIAGNOSIS — R1110 Vomiting, unspecified: Secondary | ICD-10-CM | POA: Diagnosis not present

## 2023-09-14 DIAGNOSIS — K122 Cellulitis and abscess of mouth: Secondary | ICD-10-CM | POA: Diagnosis not present

## 2023-09-14 DIAGNOSIS — K219 Gastro-esophageal reflux disease without esophagitis: Secondary | ICD-10-CM | POA: Diagnosis not present

## 2023-09-14 DIAGNOSIS — I69320 Aphasia following cerebral infarction: Secondary | ICD-10-CM | POA: Diagnosis not present

## 2023-09-14 DIAGNOSIS — D649 Anemia, unspecified: Secondary | ICD-10-CM | POA: Diagnosis not present

## 2023-09-15 DIAGNOSIS — Z9189 Other specified personal risk factors, not elsewhere classified: Secondary | ICD-10-CM | POA: Diagnosis not present

## 2023-09-15 DIAGNOSIS — K219 Gastro-esophageal reflux disease without esophagitis: Secondary | ICD-10-CM | POA: Diagnosis not present

## 2023-09-15 DIAGNOSIS — Z79899 Other long term (current) drug therapy: Secondary | ICD-10-CM | POA: Diagnosis not present

## 2023-09-15 DIAGNOSIS — I69320 Aphasia following cerebral infarction: Secondary | ICD-10-CM | POA: Diagnosis not present

## 2023-09-16 DIAGNOSIS — D649 Anemia, unspecified: Secondary | ICD-10-CM | POA: Diagnosis not present

## 2023-09-17 DIAGNOSIS — R531 Weakness: Secondary | ICD-10-CM | POA: Diagnosis not present

## 2023-09-17 DIAGNOSIS — J449 Chronic obstructive pulmonary disease, unspecified: Secondary | ICD-10-CM | POA: Diagnosis not present

## 2023-09-17 DIAGNOSIS — E1165 Type 2 diabetes mellitus with hyperglycemia: Secondary | ICD-10-CM | POA: Diagnosis not present

## 2023-09-17 DIAGNOSIS — D649 Anemia, unspecified: Secondary | ICD-10-CM | POA: Diagnosis not present

## 2023-09-17 DIAGNOSIS — R251 Tremor, unspecified: Secondary | ICD-10-CM | POA: Diagnosis not present

## 2023-09-17 DIAGNOSIS — I69320 Aphasia following cerebral infarction: Secondary | ICD-10-CM | POA: Diagnosis not present

## 2023-09-18 DIAGNOSIS — D649 Anemia, unspecified: Secondary | ICD-10-CM | POA: Diagnosis not present

## 2023-09-18 DIAGNOSIS — K219 Gastro-esophageal reflux disease without esophagitis: Secondary | ICD-10-CM | POA: Diagnosis not present

## 2023-09-18 DIAGNOSIS — N181 Chronic kidney disease, stage 1: Secondary | ICD-10-CM | POA: Diagnosis not present

## 2023-09-18 DIAGNOSIS — I69320 Aphasia following cerebral infarction: Secondary | ICD-10-CM | POA: Diagnosis not present

## 2023-09-18 DIAGNOSIS — I69391 Dysphagia following cerebral infarction: Secondary | ICD-10-CM | POA: Diagnosis not present

## 2023-09-19 DIAGNOSIS — M6281 Muscle weakness (generalized): Secondary | ICD-10-CM | POA: Diagnosis not present

## 2023-09-19 DIAGNOSIS — I69351 Hemiplegia and hemiparesis following cerebral infarction affecting right dominant side: Secondary | ICD-10-CM | POA: Diagnosis not present

## 2023-09-20 DIAGNOSIS — M6281 Muscle weakness (generalized): Secondary | ICD-10-CM | POA: Diagnosis not present

## 2023-09-20 DIAGNOSIS — I69351 Hemiplegia and hemiparesis following cerebral infarction affecting right dominant side: Secondary | ICD-10-CM | POA: Diagnosis not present

## 2023-09-22 DIAGNOSIS — M6281 Muscle weakness (generalized): Secondary | ICD-10-CM | POA: Diagnosis not present

## 2023-09-22 DIAGNOSIS — I69351 Hemiplegia and hemiparesis following cerebral infarction affecting right dominant side: Secondary | ICD-10-CM | POA: Diagnosis not present

## 2023-09-23 DIAGNOSIS — I69351 Hemiplegia and hemiparesis following cerebral infarction affecting right dominant side: Secondary | ICD-10-CM | POA: Diagnosis not present

## 2023-09-23 DIAGNOSIS — M6281 Muscle weakness (generalized): Secondary | ICD-10-CM | POA: Diagnosis not present

## 2023-09-24 DIAGNOSIS — M6281 Muscle weakness (generalized): Secondary | ICD-10-CM | POA: Diagnosis not present

## 2023-09-24 DIAGNOSIS — I69351 Hemiplegia and hemiparesis following cerebral infarction affecting right dominant side: Secondary | ICD-10-CM | POA: Diagnosis not present

## 2023-09-26 DIAGNOSIS — I69351 Hemiplegia and hemiparesis following cerebral infarction affecting right dominant side: Secondary | ICD-10-CM | POA: Diagnosis not present

## 2023-09-26 DIAGNOSIS — M6281 Muscle weakness (generalized): Secondary | ICD-10-CM | POA: Diagnosis not present

## 2023-09-28 DIAGNOSIS — M6281 Muscle weakness (generalized): Secondary | ICD-10-CM | POA: Diagnosis not present

## 2023-09-28 DIAGNOSIS — I69351 Hemiplegia and hemiparesis following cerebral infarction affecting right dominant side: Secondary | ICD-10-CM | POA: Diagnosis not present

## 2023-09-29 DIAGNOSIS — I69351 Hemiplegia and hemiparesis following cerebral infarction affecting right dominant side: Secondary | ICD-10-CM | POA: Diagnosis not present

## 2023-09-29 DIAGNOSIS — M6281 Muscle weakness (generalized): Secondary | ICD-10-CM | POA: Diagnosis not present

## 2023-09-30 DIAGNOSIS — I69320 Aphasia following cerebral infarction: Secondary | ICD-10-CM | POA: Diagnosis not present

## 2023-09-30 DIAGNOSIS — F5105 Insomnia due to other mental disorder: Secondary | ICD-10-CM | POA: Diagnosis not present

## 2023-09-30 DIAGNOSIS — F331 Major depressive disorder, recurrent, moderate: Secondary | ICD-10-CM | POA: Diagnosis not present

## 2023-09-30 DIAGNOSIS — M6281 Muscle weakness (generalized): Secondary | ICD-10-CM | POA: Diagnosis not present

## 2023-09-30 DIAGNOSIS — F411 Generalized anxiety disorder: Secondary | ICD-10-CM | POA: Diagnosis not present

## 2023-09-30 DIAGNOSIS — I69351 Hemiplegia and hemiparesis following cerebral infarction affecting right dominant side: Secondary | ICD-10-CM | POA: Diagnosis not present

## 2023-09-30 DIAGNOSIS — G8929 Other chronic pain: Secondary | ICD-10-CM | POA: Diagnosis not present

## 2023-10-01 DIAGNOSIS — K0500 Acute gingivitis, plaque induced: Secondary | ICD-10-CM | POA: Diagnosis not present

## 2023-10-01 DIAGNOSIS — K122 Cellulitis and abscess of mouth: Secondary | ICD-10-CM | POA: Diagnosis not present

## 2023-10-01 DIAGNOSIS — I69320 Aphasia following cerebral infarction: Secondary | ICD-10-CM | POA: Diagnosis not present

## 2023-10-01 DIAGNOSIS — I69351 Hemiplegia and hemiparesis following cerebral infarction affecting right dominant side: Secondary | ICD-10-CM | POA: Diagnosis not present

## 2023-10-01 DIAGNOSIS — M6281 Muscle weakness (generalized): Secondary | ICD-10-CM | POA: Diagnosis not present

## 2023-10-04 DIAGNOSIS — I69351 Hemiplegia and hemiparesis following cerebral infarction affecting right dominant side: Secondary | ICD-10-CM | POA: Diagnosis not present

## 2023-10-04 DIAGNOSIS — M6281 Muscle weakness (generalized): Secondary | ICD-10-CM | POA: Diagnosis not present

## 2023-10-05 DIAGNOSIS — K0500 Acute gingivitis, plaque induced: Secondary | ICD-10-CM | POA: Diagnosis not present

## 2023-10-05 DIAGNOSIS — M6281 Muscle weakness (generalized): Secondary | ICD-10-CM | POA: Diagnosis not present

## 2023-10-05 DIAGNOSIS — I69351 Hemiplegia and hemiparesis following cerebral infarction affecting right dominant side: Secondary | ICD-10-CM | POA: Diagnosis not present

## 2023-10-05 DIAGNOSIS — K122 Cellulitis and abscess of mouth: Secondary | ICD-10-CM | POA: Diagnosis not present

## 2023-10-05 DIAGNOSIS — I69320 Aphasia following cerebral infarction: Secondary | ICD-10-CM | POA: Diagnosis not present

## 2023-10-06 DIAGNOSIS — M6281 Muscle weakness (generalized): Secondary | ICD-10-CM | POA: Diagnosis not present

## 2023-10-06 DIAGNOSIS — I69351 Hemiplegia and hemiparesis following cerebral infarction affecting right dominant side: Secondary | ICD-10-CM | POA: Diagnosis not present

## 2023-10-07 DIAGNOSIS — I69351 Hemiplegia and hemiparesis following cerebral infarction affecting right dominant side: Secondary | ICD-10-CM | POA: Diagnosis not present

## 2023-10-07 DIAGNOSIS — M6281 Muscle weakness (generalized): Secondary | ICD-10-CM | POA: Diagnosis not present

## 2023-10-08 DIAGNOSIS — I69351 Hemiplegia and hemiparesis following cerebral infarction affecting right dominant side: Secondary | ICD-10-CM | POA: Diagnosis not present

## 2023-10-08 DIAGNOSIS — M6281 Muscle weakness (generalized): Secondary | ICD-10-CM | POA: Diagnosis not present

## 2023-10-11 DIAGNOSIS — M6281 Muscle weakness (generalized): Secondary | ICD-10-CM | POA: Diagnosis not present

## 2023-10-11 DIAGNOSIS — I69351 Hemiplegia and hemiparesis following cerebral infarction affecting right dominant side: Secondary | ICD-10-CM | POA: Diagnosis not present

## 2023-10-12 DIAGNOSIS — I69351 Hemiplegia and hemiparesis following cerebral infarction affecting right dominant side: Secondary | ICD-10-CM | POA: Diagnosis not present

## 2023-10-12 DIAGNOSIS — M6281 Muscle weakness (generalized): Secondary | ICD-10-CM | POA: Diagnosis not present

## 2023-10-13 DIAGNOSIS — I69351 Hemiplegia and hemiparesis following cerebral infarction affecting right dominant side: Secondary | ICD-10-CM | POA: Diagnosis not present

## 2023-10-13 DIAGNOSIS — M6281 Muscle weakness (generalized): Secondary | ICD-10-CM | POA: Diagnosis not present

## 2023-10-14 DIAGNOSIS — I69351 Hemiplegia and hemiparesis following cerebral infarction affecting right dominant side: Secondary | ICD-10-CM | POA: Diagnosis not present

## 2023-10-14 DIAGNOSIS — M6281 Muscle weakness (generalized): Secondary | ICD-10-CM | POA: Diagnosis not present

## 2023-11-01 DIAGNOSIS — I69320 Aphasia following cerebral infarction: Secondary | ICD-10-CM | POA: Diagnosis not present

## 2023-11-01 DIAGNOSIS — R531 Weakness: Secondary | ICD-10-CM | POA: Diagnosis not present

## 2023-11-01 DIAGNOSIS — R5383 Other fatigue: Secondary | ICD-10-CM | POA: Diagnosis not present

## 2023-11-01 DIAGNOSIS — R638 Other symptoms and signs concerning food and fluid intake: Secondary | ICD-10-CM | POA: Diagnosis not present

## 2023-11-01 DIAGNOSIS — Z20828 Contact with and (suspected) exposure to other viral communicable diseases: Secondary | ICD-10-CM | POA: Diagnosis not present

## 2023-11-02 DIAGNOSIS — F331 Major depressive disorder, recurrent, moderate: Secondary | ICD-10-CM | POA: Diagnosis not present

## 2023-11-02 DIAGNOSIS — R52 Pain, unspecified: Secondary | ICD-10-CM | POA: Diagnosis not present

## 2023-11-02 DIAGNOSIS — Z20828 Contact with and (suspected) exposure to other viral communicable diseases: Secondary | ICD-10-CM | POA: Diagnosis not present

## 2023-11-02 DIAGNOSIS — R531 Weakness: Secondary | ICD-10-CM | POA: Diagnosis not present

## 2023-11-02 DIAGNOSIS — I69320 Aphasia following cerebral infarction: Secondary | ICD-10-CM | POA: Diagnosis not present

## 2023-11-06 DIAGNOSIS — R509 Fever, unspecified: Secondary | ICD-10-CM | POA: Diagnosis not present

## 2023-11-09 DIAGNOSIS — Z20828 Contact with and (suspected) exposure to other viral communicable diseases: Secondary | ICD-10-CM | POA: Diagnosis not present

## 2023-11-09 DIAGNOSIS — I69320 Aphasia following cerebral infarction: Secondary | ICD-10-CM | POA: Diagnosis not present

## 2023-11-09 DIAGNOSIS — R531 Weakness: Secondary | ICD-10-CM | POA: Diagnosis not present

## 2023-11-25 DIAGNOSIS — G8929 Other chronic pain: Secondary | ICD-10-CM | POA: Diagnosis not present

## 2023-11-25 DIAGNOSIS — F331 Major depressive disorder, recurrent, moderate: Secondary | ICD-10-CM | POA: Diagnosis not present

## 2023-11-25 DIAGNOSIS — F5105 Insomnia due to other mental disorder: Secondary | ICD-10-CM | POA: Diagnosis not present

## 2023-11-25 DIAGNOSIS — F411 Generalized anxiety disorder: Secondary | ICD-10-CM | POA: Diagnosis not present

## 2023-11-25 DIAGNOSIS — I69320 Aphasia following cerebral infarction: Secondary | ICD-10-CM | POA: Diagnosis not present

## 2023-12-01 DIAGNOSIS — I63232 Cerebral infarction due to unspecified occlusion or stenosis of left carotid arteries: Secondary | ICD-10-CM | POA: Diagnosis not present

## 2023-12-08 DIAGNOSIS — I69320 Aphasia following cerebral infarction: Secondary | ICD-10-CM | POA: Diagnosis not present

## 2023-12-08 DIAGNOSIS — I69822 Dysarthria following other cerebrovascular disease: Secondary | ICD-10-CM | POA: Diagnosis not present

## 2023-12-09 DIAGNOSIS — I69822 Dysarthria following other cerebrovascular disease: Secondary | ICD-10-CM | POA: Diagnosis not present

## 2023-12-09 DIAGNOSIS — I69320 Aphasia following cerebral infarction: Secondary | ICD-10-CM | POA: Diagnosis not present

## 2023-12-10 DIAGNOSIS — I10 Essential (primary) hypertension: Secondary | ICD-10-CM | POA: Diagnosis not present

## 2023-12-10 DIAGNOSIS — K1379 Other lesions of oral mucosa: Secondary | ICD-10-CM | POA: Diagnosis not present

## 2023-12-10 DIAGNOSIS — K122 Cellulitis and abscess of mouth: Secondary | ICD-10-CM | POA: Diagnosis not present

## 2023-12-10 DIAGNOSIS — R531 Weakness: Secondary | ICD-10-CM | POA: Diagnosis not present

## 2023-12-10 DIAGNOSIS — E119 Type 2 diabetes mellitus without complications: Secondary | ICD-10-CM | POA: Diagnosis not present

## 2023-12-10 DIAGNOSIS — K0510 Chronic gingivitis, plaque induced: Secondary | ICD-10-CM | POA: Diagnosis not present

## 2023-12-10 DIAGNOSIS — I69822 Dysarthria following other cerebrovascular disease: Secondary | ICD-10-CM | POA: Diagnosis not present

## 2023-12-10 DIAGNOSIS — I69320 Aphasia following cerebral infarction: Secondary | ICD-10-CM | POA: Diagnosis not present

## 2023-12-11 DIAGNOSIS — R531 Weakness: Secondary | ICD-10-CM | POA: Diagnosis not present

## 2023-12-11 DIAGNOSIS — D649 Anemia, unspecified: Secondary | ICD-10-CM | POA: Diagnosis not present

## 2023-12-11 DIAGNOSIS — E1122 Type 2 diabetes mellitus with diabetic chronic kidney disease: Secondary | ICD-10-CM | POA: Diagnosis not present

## 2023-12-11 DIAGNOSIS — K122 Cellulitis and abscess of mouth: Secondary | ICD-10-CM | POA: Diagnosis not present

## 2023-12-11 DIAGNOSIS — I69320 Aphasia following cerebral infarction: Secondary | ICD-10-CM | POA: Diagnosis not present

## 2023-12-11 DIAGNOSIS — N181 Chronic kidney disease, stage 1: Secondary | ICD-10-CM | POA: Diagnosis not present

## 2023-12-11 DIAGNOSIS — K0510 Chronic gingivitis, plaque induced: Secondary | ICD-10-CM | POA: Diagnosis not present

## 2023-12-11 DIAGNOSIS — K1379 Other lesions of oral mucosa: Secondary | ICD-10-CM | POA: Diagnosis not present

## 2023-12-13 DIAGNOSIS — K122 Cellulitis and abscess of mouth: Secondary | ICD-10-CM | POA: Diagnosis not present

## 2023-12-13 DIAGNOSIS — I69822 Dysarthria following other cerebrovascular disease: Secondary | ICD-10-CM | POA: Diagnosis not present

## 2023-12-13 DIAGNOSIS — K0510 Chronic gingivitis, plaque induced: Secondary | ICD-10-CM | POA: Diagnosis not present

## 2023-12-13 DIAGNOSIS — K1379 Other lesions of oral mucosa: Secondary | ICD-10-CM | POA: Diagnosis not present

## 2023-12-13 DIAGNOSIS — R531 Weakness: Secondary | ICD-10-CM | POA: Diagnosis not present

## 2023-12-13 DIAGNOSIS — I69320 Aphasia following cerebral infarction: Secondary | ICD-10-CM | POA: Diagnosis not present

## 2023-12-14 DIAGNOSIS — I69822 Dysarthria following other cerebrovascular disease: Secondary | ICD-10-CM | POA: Diagnosis not present

## 2023-12-14 DIAGNOSIS — I69320 Aphasia following cerebral infarction: Secondary | ICD-10-CM | POA: Diagnosis not present

## 2023-12-15 DIAGNOSIS — R531 Weakness: Secondary | ICD-10-CM | POA: Diagnosis not present

## 2023-12-15 DIAGNOSIS — I69822 Dysarthria following other cerebrovascular disease: Secondary | ICD-10-CM | POA: Diagnosis not present

## 2023-12-15 DIAGNOSIS — I69320 Aphasia following cerebral infarction: Secondary | ICD-10-CM | POA: Diagnosis not present

## 2023-12-15 DIAGNOSIS — K122 Cellulitis and abscess of mouth: Secondary | ICD-10-CM | POA: Diagnosis not present

## 2023-12-15 DIAGNOSIS — K1379 Other lesions of oral mucosa: Secondary | ICD-10-CM | POA: Diagnosis not present

## 2023-12-16 DIAGNOSIS — I69320 Aphasia following cerebral infarction: Secondary | ICD-10-CM | POA: Diagnosis not present

## 2023-12-16 DIAGNOSIS — I69822 Dysarthria following other cerebrovascular disease: Secondary | ICD-10-CM | POA: Diagnosis not present

## 2023-12-17 DIAGNOSIS — I69320 Aphasia following cerebral infarction: Secondary | ICD-10-CM | POA: Diagnosis not present

## 2023-12-17 DIAGNOSIS — I69822 Dysarthria following other cerebrovascular disease: Secondary | ICD-10-CM | POA: Diagnosis not present

## 2023-12-20 DIAGNOSIS — I69320 Aphasia following cerebral infarction: Secondary | ICD-10-CM | POA: Diagnosis not present

## 2023-12-20 DIAGNOSIS — I69822 Dysarthria following other cerebrovascular disease: Secondary | ICD-10-CM | POA: Diagnosis not present

## 2023-12-21 DIAGNOSIS — I69320 Aphasia following cerebral infarction: Secondary | ICD-10-CM | POA: Diagnosis not present

## 2023-12-21 DIAGNOSIS — I69822 Dysarthria following other cerebrovascular disease: Secondary | ICD-10-CM | POA: Diagnosis not present

## 2023-12-22 DIAGNOSIS — I63232 Cerebral infarction due to unspecified occlusion or stenosis of left carotid arteries: Secondary | ICD-10-CM | POA: Diagnosis not present

## 2023-12-23 DIAGNOSIS — I69320 Aphasia following cerebral infarction: Secondary | ICD-10-CM | POA: Diagnosis not present

## 2023-12-23 DIAGNOSIS — F411 Generalized anxiety disorder: Secondary | ICD-10-CM | POA: Diagnosis not present

## 2023-12-23 DIAGNOSIS — F5105 Insomnia due to other mental disorder: Secondary | ICD-10-CM | POA: Diagnosis not present

## 2023-12-23 DIAGNOSIS — F331 Major depressive disorder, recurrent, moderate: Secondary | ICD-10-CM | POA: Diagnosis not present

## 2023-12-27 DIAGNOSIS — Z8673 Personal history of transient ischemic attack (TIA), and cerebral infarction without residual deficits: Secondary | ICD-10-CM | POA: Diagnosis not present

## 2023-12-27 DIAGNOSIS — Z794 Long term (current) use of insulin: Secondary | ICD-10-CM | POA: Diagnosis not present

## 2023-12-27 DIAGNOSIS — I69320 Aphasia following cerebral infarction: Secondary | ICD-10-CM | POA: Diagnosis not present

## 2023-12-27 DIAGNOSIS — K1379 Other lesions of oral mucosa: Secondary | ICD-10-CM | POA: Diagnosis not present

## 2023-12-27 DIAGNOSIS — E1165 Type 2 diabetes mellitus with hyperglycemia: Secondary | ICD-10-CM | POA: Diagnosis not present

## 2023-12-27 DIAGNOSIS — R531 Weakness: Secondary | ICD-10-CM | POA: Diagnosis not present

## 2023-12-27 DIAGNOSIS — I69951 Hemiplegia and hemiparesis following unspecified cerebrovascular disease affecting right dominant side: Secondary | ICD-10-CM | POA: Diagnosis not present

## 2024-01-05 DIAGNOSIS — I63232 Cerebral infarction due to unspecified occlusion or stenosis of left carotid arteries: Secondary | ICD-10-CM | POA: Diagnosis not present

## 2024-01-20 DIAGNOSIS — F5105 Insomnia due to other mental disorder: Secondary | ICD-10-CM | POA: Diagnosis not present

## 2024-01-20 DIAGNOSIS — F411 Generalized anxiety disorder: Secondary | ICD-10-CM | POA: Diagnosis not present

## 2024-01-20 DIAGNOSIS — I69320 Aphasia following cerebral infarction: Secondary | ICD-10-CM | POA: Diagnosis not present

## 2024-01-20 DIAGNOSIS — F331 Major depressive disorder, recurrent, moderate: Secondary | ICD-10-CM | POA: Diagnosis not present

## 2024-01-26 DIAGNOSIS — E1165 Type 2 diabetes mellitus with hyperglycemia: Secondary | ICD-10-CM | POA: Diagnosis not present

## 2024-01-26 DIAGNOSIS — R531 Weakness: Secondary | ICD-10-CM | POA: Diagnosis not present

## 2024-01-26 DIAGNOSIS — I69320 Aphasia following cerebral infarction: Secondary | ICD-10-CM | POA: Diagnosis not present

## 2024-01-26 DIAGNOSIS — I69951 Hemiplegia and hemiparesis following unspecified cerebrovascular disease affecting right dominant side: Secondary | ICD-10-CM | POA: Diagnosis not present

## 2024-01-26 DIAGNOSIS — Z794 Long term (current) use of insulin: Secondary | ICD-10-CM | POA: Diagnosis not present

## 2024-01-26 DIAGNOSIS — Z8673 Personal history of transient ischemic attack (TIA), and cerebral infarction without residual deficits: Secondary | ICD-10-CM | POA: Diagnosis not present

## 2024-01-26 DIAGNOSIS — I63232 Cerebral infarction due to unspecified occlusion or stenosis of left carotid arteries: Secondary | ICD-10-CM | POA: Diagnosis not present

## 2024-02-01 DIAGNOSIS — K1379 Other lesions of oral mucosa: Secondary | ICD-10-CM | POA: Diagnosis not present

## 2024-02-01 DIAGNOSIS — K0510 Chronic gingivitis, plaque induced: Secondary | ICD-10-CM | POA: Diagnosis not present

## 2024-02-01 DIAGNOSIS — R531 Weakness: Secondary | ICD-10-CM | POA: Diagnosis not present

## 2024-02-03 DIAGNOSIS — K1379 Other lesions of oral mucosa: Secondary | ICD-10-CM | POA: Diagnosis not present

## 2024-02-03 DIAGNOSIS — R531 Weakness: Secondary | ICD-10-CM | POA: Diagnosis not present

## 2024-02-03 DIAGNOSIS — K0510 Chronic gingivitis, plaque induced: Secondary | ICD-10-CM | POA: Diagnosis not present

## 2024-02-04 DIAGNOSIS — J449 Chronic obstructive pulmonary disease, unspecified: Secondary | ICD-10-CM | POA: Diagnosis not present

## 2024-02-04 DIAGNOSIS — E1165 Type 2 diabetes mellitus with hyperglycemia: Secondary | ICD-10-CM | POA: Diagnosis not present

## 2024-02-04 DIAGNOSIS — R569 Unspecified convulsions: Secondary | ICD-10-CM | POA: Diagnosis not present

## 2024-02-04 DIAGNOSIS — E1159 Type 2 diabetes mellitus with other circulatory complications: Secondary | ICD-10-CM | POA: Diagnosis not present

## 2024-02-04 DIAGNOSIS — R531 Weakness: Secondary | ICD-10-CM | POA: Diagnosis not present

## 2024-02-06 DIAGNOSIS — J449 Chronic obstructive pulmonary disease, unspecified: Secondary | ICD-10-CM | POA: Diagnosis not present

## 2024-02-06 DIAGNOSIS — R531 Weakness: Secondary | ICD-10-CM | POA: Diagnosis not present

## 2024-02-07 DIAGNOSIS — I63232 Cerebral infarction due to unspecified occlusion or stenosis of left carotid arteries: Secondary | ICD-10-CM | POA: Diagnosis not present

## 2024-02-07 DIAGNOSIS — I69351 Hemiplegia and hemiparesis following cerebral infarction affecting right dominant side: Secondary | ICD-10-CM | POA: Diagnosis not present

## 2024-02-08 DIAGNOSIS — R059 Cough, unspecified: Secondary | ICD-10-CM | POA: Diagnosis not present

## 2024-02-08 DIAGNOSIS — Z7409 Other reduced mobility: Secondary | ICD-10-CM | POA: Diagnosis not present

## 2024-02-08 DIAGNOSIS — R52 Pain, unspecified: Secondary | ICD-10-CM | POA: Diagnosis not present

## 2024-02-08 DIAGNOSIS — J449 Chronic obstructive pulmonary disease, unspecified: Secondary | ICD-10-CM | POA: Diagnosis not present

## 2024-02-08 DIAGNOSIS — I69320 Aphasia following cerebral infarction: Secondary | ICD-10-CM | POA: Diagnosis not present

## 2024-02-08 DIAGNOSIS — R531 Weakness: Secondary | ICD-10-CM | POA: Diagnosis not present

## 2024-02-09 DIAGNOSIS — R062 Wheezing: Secondary | ICD-10-CM | POA: Diagnosis not present

## 2024-02-09 DIAGNOSIS — J441 Chronic obstructive pulmonary disease with (acute) exacerbation: Secondary | ICD-10-CM | POA: Diagnosis not present

## 2024-02-09 DIAGNOSIS — R531 Weakness: Secondary | ICD-10-CM | POA: Diagnosis not present

## 2024-02-09 DIAGNOSIS — I10 Essential (primary) hypertension: Secondary | ICD-10-CM | POA: Diagnosis not present

## 2024-02-09 DIAGNOSIS — I63232 Cerebral infarction due to unspecified occlusion or stenosis of left carotid arteries: Secondary | ICD-10-CM | POA: Diagnosis not present

## 2024-02-09 DIAGNOSIS — N813 Complete uterovaginal prolapse: Secondary | ICD-10-CM | POA: Diagnosis not present

## 2024-02-09 DIAGNOSIS — N390 Urinary tract infection, site not specified: Secondary | ICD-10-CM | POA: Diagnosis not present

## 2024-02-09 DIAGNOSIS — R059 Cough, unspecified: Secondary | ICD-10-CM | POA: Diagnosis not present

## 2024-02-09 DIAGNOSIS — Z7409 Other reduced mobility: Secondary | ICD-10-CM | POA: Diagnosis not present

## 2024-02-10 DIAGNOSIS — Z7409 Other reduced mobility: Secondary | ICD-10-CM | POA: Diagnosis not present

## 2024-02-10 DIAGNOSIS — R062 Wheezing: Secondary | ICD-10-CM | POA: Diagnosis not present

## 2024-02-10 DIAGNOSIS — R059 Cough, unspecified: Secondary | ICD-10-CM | POA: Diagnosis not present

## 2024-02-10 DIAGNOSIS — R531 Weakness: Secondary | ICD-10-CM | POA: Diagnosis not present

## 2024-02-10 DIAGNOSIS — J441 Chronic obstructive pulmonary disease with (acute) exacerbation: Secondary | ICD-10-CM | POA: Diagnosis not present

## 2024-02-11 DIAGNOSIS — R059 Cough, unspecified: Secondary | ICD-10-CM | POA: Diagnosis not present

## 2024-02-11 DIAGNOSIS — R531 Weakness: Secondary | ICD-10-CM | POA: Diagnosis not present

## 2024-02-11 DIAGNOSIS — Z7409 Other reduced mobility: Secondary | ICD-10-CM | POA: Diagnosis not present

## 2024-02-11 DIAGNOSIS — R062 Wheezing: Secondary | ICD-10-CM | POA: Diagnosis not present

## 2024-02-11 DIAGNOSIS — J441 Chronic obstructive pulmonary disease with (acute) exacerbation: Secondary | ICD-10-CM | POA: Diagnosis not present

## 2024-02-11 DIAGNOSIS — J189 Pneumonia, unspecified organism: Secondary | ICD-10-CM | POA: Diagnosis not present

## 2024-02-12 DIAGNOSIS — J189 Pneumonia, unspecified organism: Secondary | ICD-10-CM | POA: Diagnosis not present

## 2024-02-12 DIAGNOSIS — J441 Chronic obstructive pulmonary disease with (acute) exacerbation: Secondary | ICD-10-CM | POA: Diagnosis not present

## 2024-02-12 DIAGNOSIS — R531 Weakness: Secondary | ICD-10-CM | POA: Diagnosis not present

## 2024-02-12 DIAGNOSIS — R062 Wheezing: Secondary | ICD-10-CM | POA: Diagnosis not present

## 2024-02-12 DIAGNOSIS — K5900 Constipation, unspecified: Secondary | ICD-10-CM | POA: Diagnosis not present

## 2024-02-12 DIAGNOSIS — Z7409 Other reduced mobility: Secondary | ICD-10-CM | POA: Diagnosis not present

## 2024-02-12 DIAGNOSIS — R059 Cough, unspecified: Secondary | ICD-10-CM | POA: Diagnosis not present

## 2024-02-14 DIAGNOSIS — J189 Pneumonia, unspecified organism: Secondary | ICD-10-CM | POA: Diagnosis not present

## 2024-02-16 DIAGNOSIS — F411 Generalized anxiety disorder: Secondary | ICD-10-CM | POA: Diagnosis not present

## 2024-02-16 DIAGNOSIS — F331 Major depressive disorder, recurrent, moderate: Secondary | ICD-10-CM | POA: Diagnosis not present

## 2024-02-17 DIAGNOSIS — F5105 Insomnia due to other mental disorder: Secondary | ICD-10-CM | POA: Diagnosis not present

## 2024-02-17 DIAGNOSIS — F331 Major depressive disorder, recurrent, moderate: Secondary | ICD-10-CM | POA: Diagnosis not present

## 2024-02-17 DIAGNOSIS — F411 Generalized anxiety disorder: Secondary | ICD-10-CM | POA: Diagnosis not present

## 2024-02-17 DIAGNOSIS — I69320 Aphasia following cerebral infarction: Secondary | ICD-10-CM | POA: Diagnosis not present

## 2024-02-18 DIAGNOSIS — I63232 Cerebral infarction due to unspecified occlusion or stenosis of left carotid arteries: Secondary | ICD-10-CM | POA: Diagnosis not present

## 2024-02-20 DIAGNOSIS — I69351 Hemiplegia and hemiparesis following cerebral infarction affecting right dominant side: Secondary | ICD-10-CM | POA: Diagnosis not present

## 2024-02-20 DIAGNOSIS — M6281 Muscle weakness (generalized): Secondary | ICD-10-CM | POA: Diagnosis not present

## 2024-02-22 DIAGNOSIS — Z7409 Other reduced mobility: Secondary | ICD-10-CM | POA: Diagnosis not present

## 2024-02-22 DIAGNOSIS — I69351 Hemiplegia and hemiparesis following cerebral infarction affecting right dominant side: Secondary | ICD-10-CM | POA: Diagnosis not present

## 2024-02-22 DIAGNOSIS — M6281 Muscle weakness (generalized): Secondary | ICD-10-CM | POA: Diagnosis not present

## 2024-02-22 DIAGNOSIS — R531 Weakness: Secondary | ICD-10-CM | POA: Diagnosis not present

## 2024-02-22 DIAGNOSIS — E1165 Type 2 diabetes mellitus with hyperglycemia: Secondary | ICD-10-CM | POA: Diagnosis not present

## 2024-02-22 DIAGNOSIS — I69320 Aphasia following cerebral infarction: Secondary | ICD-10-CM | POA: Diagnosis not present

## 2024-02-23 DIAGNOSIS — M6281 Muscle weakness (generalized): Secondary | ICD-10-CM | POA: Diagnosis not present

## 2024-02-23 DIAGNOSIS — I69351 Hemiplegia and hemiparesis following cerebral infarction affecting right dominant side: Secondary | ICD-10-CM | POA: Diagnosis not present

## 2024-02-23 DIAGNOSIS — I63232 Cerebral infarction due to unspecified occlusion or stenosis of left carotid arteries: Secondary | ICD-10-CM | POA: Diagnosis not present

## 2024-02-24 DIAGNOSIS — Z8673 Personal history of transient ischemic attack (TIA), and cerebral infarction without residual deficits: Secondary | ICD-10-CM | POA: Diagnosis not present

## 2024-02-24 DIAGNOSIS — I1310 Hypertensive heart and chronic kidney disease without heart failure, with stage 1 through stage 4 chronic kidney disease, or unspecified chronic kidney disease: Secondary | ICD-10-CM | POA: Diagnosis not present

## 2024-02-24 DIAGNOSIS — I69951 Hemiplegia and hemiparesis following unspecified cerebrovascular disease affecting right dominant side: Secondary | ICD-10-CM | POA: Diagnosis not present

## 2024-02-24 DIAGNOSIS — I69351 Hemiplegia and hemiparesis following cerebral infarction affecting right dominant side: Secondary | ICD-10-CM | POA: Diagnosis not present

## 2024-02-24 DIAGNOSIS — R531 Weakness: Secondary | ICD-10-CM | POA: Diagnosis not present

## 2024-02-24 DIAGNOSIS — I69320 Aphasia following cerebral infarction: Secondary | ICD-10-CM | POA: Diagnosis not present

## 2024-02-24 DIAGNOSIS — Z7409 Other reduced mobility: Secondary | ICD-10-CM | POA: Diagnosis not present

## 2024-02-24 DIAGNOSIS — M6281 Muscle weakness (generalized): Secondary | ICD-10-CM | POA: Diagnosis not present

## 2024-02-24 DIAGNOSIS — Z79899 Other long term (current) drug therapy: Secondary | ICD-10-CM | POA: Diagnosis not present

## 2024-02-25 DIAGNOSIS — M6281 Muscle weakness (generalized): Secondary | ICD-10-CM | POA: Diagnosis not present

## 2024-02-25 DIAGNOSIS — I69351 Hemiplegia and hemiparesis following cerebral infarction affecting right dominant side: Secondary | ICD-10-CM | POA: Diagnosis not present

## 2024-02-27 DIAGNOSIS — M6281 Muscle weakness (generalized): Secondary | ICD-10-CM | POA: Diagnosis not present

## 2024-02-27 DIAGNOSIS — I69351 Hemiplegia and hemiparesis following cerebral infarction affecting right dominant side: Secondary | ICD-10-CM | POA: Diagnosis not present

## 2024-02-28 DIAGNOSIS — M6281 Muscle weakness (generalized): Secondary | ICD-10-CM | POA: Diagnosis not present

## 2024-02-28 DIAGNOSIS — I69351 Hemiplegia and hemiparesis following cerebral infarction affecting right dominant side: Secondary | ICD-10-CM | POA: Diagnosis not present

## 2024-03-01 DIAGNOSIS — M6281 Muscle weakness (generalized): Secondary | ICD-10-CM | POA: Diagnosis not present

## 2024-03-01 DIAGNOSIS — I69351 Hemiplegia and hemiparesis following cerebral infarction affecting right dominant side: Secondary | ICD-10-CM | POA: Diagnosis not present

## 2024-03-02 DIAGNOSIS — M6281 Muscle weakness (generalized): Secondary | ICD-10-CM | POA: Diagnosis not present

## 2024-03-02 DIAGNOSIS — I69351 Hemiplegia and hemiparesis following cerebral infarction affecting right dominant side: Secondary | ICD-10-CM | POA: Diagnosis not present

## 2024-03-03 DIAGNOSIS — M6281 Muscle weakness (generalized): Secondary | ICD-10-CM | POA: Diagnosis not present

## 2024-03-03 DIAGNOSIS — I69351 Hemiplegia and hemiparesis following cerebral infarction affecting right dominant side: Secondary | ICD-10-CM | POA: Diagnosis not present

## 2024-03-04 DIAGNOSIS — Z8673 Personal history of transient ischemic attack (TIA), and cerebral infarction without residual deficits: Secondary | ICD-10-CM | POA: Diagnosis not present

## 2024-03-04 DIAGNOSIS — Z7409 Other reduced mobility: Secondary | ICD-10-CM | POA: Diagnosis not present

## 2024-03-04 DIAGNOSIS — E1165 Type 2 diabetes mellitus with hyperglycemia: Secondary | ICD-10-CM | POA: Diagnosis not present

## 2024-03-04 DIAGNOSIS — D649 Anemia, unspecified: Secondary | ICD-10-CM | POA: Diagnosis not present

## 2024-03-04 DIAGNOSIS — I69951 Hemiplegia and hemiparesis following unspecified cerebrovascular disease affecting right dominant side: Secondary | ICD-10-CM | POA: Diagnosis not present

## 2024-03-04 DIAGNOSIS — I10 Essential (primary) hypertension: Secondary | ICD-10-CM | POA: Diagnosis not present

## 2024-03-04 DIAGNOSIS — R531 Weakness: Secondary | ICD-10-CM | POA: Diagnosis not present

## 2024-03-04 DIAGNOSIS — I69320 Aphasia following cerebral infarction: Secondary | ICD-10-CM | POA: Diagnosis not present

## 2024-03-07 DIAGNOSIS — I69351 Hemiplegia and hemiparesis following cerebral infarction affecting right dominant side: Secondary | ICD-10-CM | POA: Diagnosis not present

## 2024-03-07 DIAGNOSIS — M6281 Muscle weakness (generalized): Secondary | ICD-10-CM | POA: Diagnosis not present

## 2024-03-09 DIAGNOSIS — M6281 Muscle weakness (generalized): Secondary | ICD-10-CM | POA: Diagnosis not present

## 2024-03-09 DIAGNOSIS — I69351 Hemiplegia and hemiparesis following cerebral infarction affecting right dominant side: Secondary | ICD-10-CM | POA: Diagnosis not present

## 2024-03-10 DIAGNOSIS — M6281 Muscle weakness (generalized): Secondary | ICD-10-CM | POA: Diagnosis not present

## 2024-03-10 DIAGNOSIS — I69351 Hemiplegia and hemiparesis following cerebral infarction affecting right dominant side: Secondary | ICD-10-CM | POA: Diagnosis not present

## 2024-03-11 DIAGNOSIS — M6281 Muscle weakness (generalized): Secondary | ICD-10-CM | POA: Diagnosis not present

## 2024-03-11 DIAGNOSIS — E113393 Type 2 diabetes mellitus with moderate nonproliferative diabetic retinopathy without macular edema, bilateral: Secondary | ICD-10-CM | POA: Diagnosis not present

## 2024-03-11 DIAGNOSIS — I69351 Hemiplegia and hemiparesis following cerebral infarction affecting right dominant side: Secondary | ICD-10-CM | POA: Diagnosis not present

## 2024-03-11 DIAGNOSIS — I10 Essential (primary) hypertension: Secondary | ICD-10-CM | POA: Diagnosis not present

## 2024-03-12 DIAGNOSIS — R531 Weakness: Secondary | ICD-10-CM | POA: Diagnosis not present

## 2024-03-12 DIAGNOSIS — E1165 Type 2 diabetes mellitus with hyperglycemia: Secondary | ICD-10-CM | POA: Diagnosis not present

## 2024-03-12 DIAGNOSIS — N189 Chronic kidney disease, unspecified: Secondary | ICD-10-CM | POA: Diagnosis not present

## 2024-03-12 DIAGNOSIS — I69351 Hemiplegia and hemiparesis following cerebral infarction affecting right dominant side: Secondary | ICD-10-CM | POA: Diagnosis not present

## 2024-03-12 DIAGNOSIS — I69320 Aphasia following cerebral infarction: Secondary | ICD-10-CM | POA: Diagnosis not present

## 2024-03-12 DIAGNOSIS — I69951 Hemiplegia and hemiparesis following unspecified cerebrovascular disease affecting right dominant side: Secondary | ICD-10-CM | POA: Diagnosis not present

## 2024-03-12 DIAGNOSIS — Z8673 Personal history of transient ischemic attack (TIA), and cerebral infarction without residual deficits: Secondary | ICD-10-CM | POA: Diagnosis not present

## 2024-03-12 DIAGNOSIS — I1310 Hypertensive heart and chronic kidney disease without heart failure, with stage 1 through stage 4 chronic kidney disease, or unspecified chronic kidney disease: Secondary | ICD-10-CM | POA: Diagnosis not present

## 2024-03-12 DIAGNOSIS — M6281 Muscle weakness (generalized): Secondary | ICD-10-CM | POA: Diagnosis not present

## 2024-03-12 DIAGNOSIS — Z7409 Other reduced mobility: Secondary | ICD-10-CM | POA: Diagnosis not present

## 2024-03-13 DIAGNOSIS — I69320 Aphasia following cerebral infarction: Secondary | ICD-10-CM | POA: Diagnosis not present

## 2024-03-13 DIAGNOSIS — I69951 Hemiplegia and hemiparesis following unspecified cerebrovascular disease affecting right dominant side: Secondary | ICD-10-CM | POA: Diagnosis not present

## 2024-03-13 DIAGNOSIS — R531 Weakness: Secondary | ICD-10-CM | POA: Diagnosis not present

## 2024-03-13 DIAGNOSIS — Z794 Long term (current) use of insulin: Secondary | ICD-10-CM | POA: Diagnosis not present

## 2024-03-13 DIAGNOSIS — Z8673 Personal history of transient ischemic attack (TIA), and cerebral infarction without residual deficits: Secondary | ICD-10-CM | POA: Diagnosis not present

## 2024-03-13 DIAGNOSIS — E11649 Type 2 diabetes mellitus with hypoglycemia without coma: Secondary | ICD-10-CM | POA: Diagnosis not present

## 2024-03-14 DIAGNOSIS — Z7409 Other reduced mobility: Secondary | ICD-10-CM | POA: Diagnosis not present

## 2024-03-14 DIAGNOSIS — I69351 Hemiplegia and hemiparesis following cerebral infarction affecting right dominant side: Secondary | ICD-10-CM | POA: Diagnosis not present

## 2024-03-14 DIAGNOSIS — R531 Weakness: Secondary | ICD-10-CM | POA: Diagnosis not present

## 2024-03-14 DIAGNOSIS — I10 Essential (primary) hypertension: Secondary | ICD-10-CM | POA: Diagnosis not present

## 2024-03-14 DIAGNOSIS — M6281 Muscle weakness (generalized): Secondary | ICD-10-CM | POA: Diagnosis not present

## 2024-03-15 DIAGNOSIS — M6281 Muscle weakness (generalized): Secondary | ICD-10-CM | POA: Diagnosis not present

## 2024-03-15 DIAGNOSIS — I10 Essential (primary) hypertension: Secondary | ICD-10-CM | POA: Diagnosis not present

## 2024-03-15 DIAGNOSIS — Z7409 Other reduced mobility: Secondary | ICD-10-CM | POA: Diagnosis not present

## 2024-03-15 DIAGNOSIS — R531 Weakness: Secondary | ICD-10-CM | POA: Diagnosis not present

## 2024-03-15 DIAGNOSIS — I69351 Hemiplegia and hemiparesis following cerebral infarction affecting right dominant side: Secondary | ICD-10-CM | POA: Diagnosis not present

## 2024-03-16 DIAGNOSIS — F411 Generalized anxiety disorder: Secondary | ICD-10-CM | POA: Diagnosis not present

## 2024-03-16 DIAGNOSIS — M6281 Muscle weakness (generalized): Secondary | ICD-10-CM | POA: Diagnosis not present

## 2024-03-16 DIAGNOSIS — G8929 Other chronic pain: Secondary | ICD-10-CM | POA: Diagnosis not present

## 2024-03-16 DIAGNOSIS — I69320 Aphasia following cerebral infarction: Secondary | ICD-10-CM | POA: Diagnosis not present

## 2024-03-16 DIAGNOSIS — I69351 Hemiplegia and hemiparesis following cerebral infarction affecting right dominant side: Secondary | ICD-10-CM | POA: Diagnosis not present

## 2024-03-16 DIAGNOSIS — F5105 Insomnia due to other mental disorder: Secondary | ICD-10-CM | POA: Diagnosis not present

## 2024-03-16 DIAGNOSIS — F331 Major depressive disorder, recurrent, moderate: Secondary | ICD-10-CM | POA: Diagnosis not present

## 2024-03-17 DIAGNOSIS — R531 Weakness: Secondary | ICD-10-CM | POA: Diagnosis not present

## 2024-03-17 DIAGNOSIS — R109 Unspecified abdominal pain: Secondary | ICD-10-CM | POA: Diagnosis not present

## 2024-03-17 DIAGNOSIS — R112 Nausea with vomiting, unspecified: Secondary | ICD-10-CM | POA: Diagnosis not present

## 2024-03-17 DIAGNOSIS — Z7409 Other reduced mobility: Secondary | ICD-10-CM | POA: Diagnosis not present

## 2024-03-18 ENCOUNTER — Encounter (HOSPITAL_COMMUNITY): Payer: Self-pay | Admitting: Interventional Radiology

## 2024-03-18 DIAGNOSIS — M6281 Muscle weakness (generalized): Secondary | ICD-10-CM | POA: Diagnosis not present

## 2024-03-18 DIAGNOSIS — I69351 Hemiplegia and hemiparesis following cerebral infarction affecting right dominant side: Secondary | ICD-10-CM | POA: Diagnosis not present

## 2024-03-18 DIAGNOSIS — Z7409 Other reduced mobility: Secondary | ICD-10-CM | POA: Diagnosis not present

## 2024-03-18 DIAGNOSIS — I10 Essential (primary) hypertension: Secondary | ICD-10-CM | POA: Diagnosis not present

## 2024-03-18 DIAGNOSIS — K567 Ileus, unspecified: Secondary | ICD-10-CM | POA: Diagnosis not present

## 2024-03-18 DIAGNOSIS — R112 Nausea with vomiting, unspecified: Secondary | ICD-10-CM | POA: Diagnosis not present

## 2024-03-18 DIAGNOSIS — R531 Weakness: Secondary | ICD-10-CM | POA: Diagnosis not present

## 2024-03-18 DIAGNOSIS — R109 Unspecified abdominal pain: Secondary | ICD-10-CM | POA: Diagnosis not present

## 2024-03-19 DIAGNOSIS — R109 Unspecified abdominal pain: Secondary | ICD-10-CM | POA: Diagnosis not present

## 2024-03-19 DIAGNOSIS — R531 Weakness: Secondary | ICD-10-CM | POA: Diagnosis not present

## 2024-03-19 DIAGNOSIS — Z7409 Other reduced mobility: Secondary | ICD-10-CM | POA: Diagnosis not present

## 2024-03-19 DIAGNOSIS — K567 Ileus, unspecified: Secondary | ICD-10-CM | POA: Diagnosis not present

## 2024-03-19 DIAGNOSIS — R112 Nausea with vomiting, unspecified: Secondary | ICD-10-CM | POA: Diagnosis not present

## 2024-03-21 DIAGNOSIS — K567 Ileus, unspecified: Secondary | ICD-10-CM | POA: Diagnosis not present

## 2024-03-22 DIAGNOSIS — Z8673 Personal history of transient ischemic attack (TIA), and cerebral infarction without residual deficits: Secondary | ICD-10-CM | POA: Diagnosis not present

## 2024-03-22 DIAGNOSIS — I69320 Aphasia following cerebral infarction: Secondary | ICD-10-CM | POA: Diagnosis not present

## 2024-03-22 DIAGNOSIS — Z8719 Personal history of other diseases of the digestive system: Secondary | ICD-10-CM | POA: Diagnosis not present

## 2024-03-22 DIAGNOSIS — Z7409 Other reduced mobility: Secondary | ICD-10-CM | POA: Diagnosis not present

## 2024-03-22 DIAGNOSIS — I63232 Cerebral infarction due to unspecified occlusion or stenosis of left carotid arteries: Secondary | ICD-10-CM | POA: Diagnosis not present

## 2024-03-22 DIAGNOSIS — R531 Weakness: Secondary | ICD-10-CM | POA: Diagnosis not present

## 2024-03-24 DIAGNOSIS — R61 Generalized hyperhidrosis: Secondary | ICD-10-CM | POA: Diagnosis not present

## 2024-03-24 DIAGNOSIS — Z8673 Personal history of transient ischemic attack (TIA), and cerebral infarction without residual deficits: Secondary | ICD-10-CM | POA: Diagnosis not present

## 2024-03-24 DIAGNOSIS — R4182 Altered mental status, unspecified: Secondary | ICD-10-CM | POA: Diagnosis not present

## 2024-03-24 DIAGNOSIS — Z7409 Other reduced mobility: Secondary | ICD-10-CM | POA: Diagnosis not present

## 2024-03-24 DIAGNOSIS — R531 Weakness: Secondary | ICD-10-CM | POA: Diagnosis not present

## 2024-03-24 DIAGNOSIS — I951 Orthostatic hypotension: Secondary | ICD-10-CM | POA: Diagnosis not present

## 2024-03-25 DIAGNOSIS — Z79899 Other long term (current) drug therapy: Secondary | ICD-10-CM | POA: Diagnosis not present

## 2024-03-26 DIAGNOSIS — Z8673 Personal history of transient ischemic attack (TIA), and cerebral infarction without residual deficits: Secondary | ICD-10-CM | POA: Diagnosis not present

## 2024-03-26 DIAGNOSIS — I951 Orthostatic hypotension: Secondary | ICD-10-CM | POA: Diagnosis not present

## 2024-03-26 DIAGNOSIS — Z7409 Other reduced mobility: Secondary | ICD-10-CM | POA: Diagnosis not present

## 2024-03-26 DIAGNOSIS — I69320 Aphasia following cerebral infarction: Secondary | ICD-10-CM | POA: Diagnosis not present

## 2024-03-26 DIAGNOSIS — R531 Weakness: Secondary | ICD-10-CM | POA: Diagnosis not present

## 2024-04-03 DIAGNOSIS — I10 Essential (primary) hypertension: Secondary | ICD-10-CM | POA: Diagnosis not present

## 2024-04-03 DIAGNOSIS — E1165 Type 2 diabetes mellitus with hyperglycemia: Secondary | ICD-10-CM | POA: Diagnosis not present

## 2024-04-03 DIAGNOSIS — I69391 Dysphagia following cerebral infarction: Secondary | ICD-10-CM | POA: Diagnosis not present

## 2024-04-03 DIAGNOSIS — Z79899 Other long term (current) drug therapy: Secondary | ICD-10-CM | POA: Diagnosis not present

## 2024-04-03 DIAGNOSIS — I69320 Aphasia following cerebral infarction: Secondary | ICD-10-CM | POA: Diagnosis not present

## 2024-04-03 DIAGNOSIS — Z7409 Other reduced mobility: Secondary | ICD-10-CM | POA: Diagnosis not present

## 2024-04-03 DIAGNOSIS — R531 Weakness: Secondary | ICD-10-CM | POA: Diagnosis not present

## 2024-04-03 DIAGNOSIS — Z8673 Personal history of transient ischemic attack (TIA), and cerebral infarction without residual deficits: Secondary | ICD-10-CM | POA: Diagnosis not present

## 2024-04-05 DIAGNOSIS — I63232 Cerebral infarction due to unspecified occlusion or stenosis of left carotid arteries: Secondary | ICD-10-CM | POA: Diagnosis not present

## 2024-04-10 DIAGNOSIS — F5105 Insomnia due to other mental disorder: Secondary | ICD-10-CM | POA: Diagnosis not present

## 2024-04-10 DIAGNOSIS — F331 Major depressive disorder, recurrent, moderate: Secondary | ICD-10-CM | POA: Diagnosis not present

## 2024-04-10 DIAGNOSIS — F411 Generalized anxiety disorder: Secondary | ICD-10-CM | POA: Diagnosis not present

## 2024-04-11 DIAGNOSIS — Z7409 Other reduced mobility: Secondary | ICD-10-CM | POA: Diagnosis not present

## 2024-04-11 DIAGNOSIS — I152 Hypertension secondary to endocrine disorders: Secondary | ICD-10-CM | POA: Diagnosis not present

## 2024-04-11 DIAGNOSIS — R531 Weakness: Secondary | ICD-10-CM | POA: Diagnosis not present

## 2024-04-11 DIAGNOSIS — E1159 Type 2 diabetes mellitus with other circulatory complications: Secondary | ICD-10-CM | POA: Diagnosis not present

## 2024-04-11 DIAGNOSIS — Z8673 Personal history of transient ischemic attack (TIA), and cerebral infarction without residual deficits: Secondary | ICD-10-CM | POA: Diagnosis not present

## 2024-04-11 DIAGNOSIS — I69320 Aphasia following cerebral infarction: Secondary | ICD-10-CM | POA: Diagnosis not present

## 2024-04-13 DIAGNOSIS — I69320 Aphasia following cerebral infarction: Secondary | ICD-10-CM | POA: Diagnosis not present

## 2024-04-13 DIAGNOSIS — F5105 Insomnia due to other mental disorder: Secondary | ICD-10-CM | POA: Diagnosis not present

## 2024-04-13 DIAGNOSIS — F411 Generalized anxiety disorder: Secondary | ICD-10-CM | POA: Diagnosis not present

## 2024-04-13 DIAGNOSIS — F331 Major depressive disorder, recurrent, moderate: Secondary | ICD-10-CM | POA: Diagnosis not present

## 2024-04-13 DIAGNOSIS — G8929 Other chronic pain: Secondary | ICD-10-CM | POA: Diagnosis not present

## 2024-04-23 DIAGNOSIS — Z8719 Personal history of other diseases of the digestive system: Secondary | ICD-10-CM | POA: Diagnosis not present

## 2024-04-23 DIAGNOSIS — Z8673 Personal history of transient ischemic attack (TIA), and cerebral infarction without residual deficits: Secondary | ICD-10-CM | POA: Diagnosis not present

## 2024-04-23 DIAGNOSIS — R1111 Vomiting without nausea: Secondary | ICD-10-CM | POA: Diagnosis not present

## 2024-04-23 DIAGNOSIS — R051 Acute cough: Secondary | ICD-10-CM | POA: Diagnosis not present

## 2024-04-23 DIAGNOSIS — R109 Unspecified abdominal pain: Secondary | ICD-10-CM | POA: Diagnosis not present

## 2024-04-23 DIAGNOSIS — R059 Cough, unspecified: Secondary | ICD-10-CM | POA: Diagnosis not present

## 2024-04-23 DIAGNOSIS — I69320 Aphasia following cerebral infarction: Secondary | ICD-10-CM | POA: Diagnosis not present

## 2024-04-23 DIAGNOSIS — R000 Tachycardia, unspecified: Secondary | ICD-10-CM | POA: Diagnosis not present

## 2024-04-23 DIAGNOSIS — R1110 Vomiting, unspecified: Secondary | ICD-10-CM | POA: Diagnosis not present

## 2024-04-24 DIAGNOSIS — Z7409 Other reduced mobility: Secondary | ICD-10-CM | POA: Diagnosis not present

## 2024-04-24 DIAGNOSIS — Z8673 Personal history of transient ischemic attack (TIA), and cerebral infarction without residual deficits: Secondary | ICD-10-CM | POA: Diagnosis not present

## 2024-04-24 DIAGNOSIS — R112 Nausea with vomiting, unspecified: Secondary | ICD-10-CM | POA: Diagnosis not present

## 2024-04-24 DIAGNOSIS — I69320 Aphasia following cerebral infarction: Secondary | ICD-10-CM | POA: Diagnosis not present

## 2024-04-24 DIAGNOSIS — R051 Acute cough: Secondary | ICD-10-CM | POA: Diagnosis not present

## 2024-04-24 DIAGNOSIS — R531 Weakness: Secondary | ICD-10-CM | POA: Diagnosis not present

## 2024-04-27 DIAGNOSIS — R059 Cough, unspecified: Secondary | ICD-10-CM | POA: Diagnosis not present

## 2024-04-27 DIAGNOSIS — R1110 Vomiting, unspecified: Secondary | ICD-10-CM | POA: Diagnosis not present

## 2024-04-28 DIAGNOSIS — R112 Nausea with vomiting, unspecified: Secondary | ICD-10-CM | POA: Diagnosis not present

## 2024-04-28 DIAGNOSIS — I152 Hypertension secondary to endocrine disorders: Secondary | ICD-10-CM | POA: Diagnosis not present

## 2024-04-28 DIAGNOSIS — R531 Weakness: Secondary | ICD-10-CM | POA: Diagnosis not present

## 2024-04-28 DIAGNOSIS — E8809 Other disorders of plasma-protein metabolism, not elsewhere classified: Secondary | ICD-10-CM | POA: Diagnosis not present

## 2024-04-28 DIAGNOSIS — R051 Acute cough: Secondary | ICD-10-CM | POA: Diagnosis not present

## 2024-05-03 DIAGNOSIS — I63232 Cerebral infarction due to unspecified occlusion or stenosis of left carotid arteries: Secondary | ICD-10-CM | POA: Diagnosis not present

## 2024-05-03 DIAGNOSIS — M6281 Muscle weakness (generalized): Secondary | ICD-10-CM | POA: Diagnosis not present

## 2024-05-04 DIAGNOSIS — I63232 Cerebral infarction due to unspecified occlusion or stenosis of left carotid arteries: Secondary | ICD-10-CM | POA: Diagnosis not present

## 2024-05-04 DIAGNOSIS — M6281 Muscle weakness (generalized): Secondary | ICD-10-CM | POA: Diagnosis not present

## 2024-05-05 DIAGNOSIS — M6281 Muscle weakness (generalized): Secondary | ICD-10-CM | POA: Diagnosis not present

## 2024-05-05 DIAGNOSIS — I63232 Cerebral infarction due to unspecified occlusion or stenosis of left carotid arteries: Secondary | ICD-10-CM | POA: Diagnosis not present

## 2024-05-07 DIAGNOSIS — E785 Hyperlipidemia, unspecified: Secondary | ICD-10-CM | POA: Diagnosis not present

## 2024-05-07 DIAGNOSIS — Z7409 Other reduced mobility: Secondary | ICD-10-CM | POA: Diagnosis not present

## 2024-05-07 DIAGNOSIS — I1310 Hypertensive heart and chronic kidney disease without heart failure, with stage 1 through stage 4 chronic kidney disease, or unspecified chronic kidney disease: Secondary | ICD-10-CM | POA: Diagnosis not present

## 2024-05-07 DIAGNOSIS — I69320 Aphasia following cerebral infarction: Secondary | ICD-10-CM | POA: Diagnosis not present

## 2024-05-07 DIAGNOSIS — E1159 Type 2 diabetes mellitus with other circulatory complications: Secondary | ICD-10-CM | POA: Diagnosis not present

## 2024-05-07 DIAGNOSIS — N189 Chronic kidney disease, unspecified: Secondary | ICD-10-CM | POA: Diagnosis not present

## 2024-05-07 DIAGNOSIS — R32 Unspecified urinary incontinence: Secondary | ICD-10-CM | POA: Diagnosis not present

## 2024-05-07 DIAGNOSIS — G629 Polyneuropathy, unspecified: Secondary | ICD-10-CM | POA: Diagnosis not present

## 2024-05-09 DIAGNOSIS — I63232 Cerebral infarction due to unspecified occlusion or stenosis of left carotid arteries: Secondary | ICD-10-CM | POA: Diagnosis not present

## 2024-05-09 DIAGNOSIS — M6281 Muscle weakness (generalized): Secondary | ICD-10-CM | POA: Diagnosis not present

## 2024-05-09 DIAGNOSIS — I152 Hypertension secondary to endocrine disorders: Secondary | ICD-10-CM | POA: Diagnosis not present

## 2024-05-09 DIAGNOSIS — R531 Weakness: Secondary | ICD-10-CM | POA: Diagnosis not present

## 2024-05-10 DIAGNOSIS — I63232 Cerebral infarction due to unspecified occlusion or stenosis of left carotid arteries: Secondary | ICD-10-CM | POA: Diagnosis not present

## 2024-05-10 DIAGNOSIS — M6281 Muscle weakness (generalized): Secondary | ICD-10-CM | POA: Diagnosis not present

## 2024-05-11 DIAGNOSIS — F411 Generalized anxiety disorder: Secondary | ICD-10-CM | POA: Diagnosis not present

## 2024-05-11 DIAGNOSIS — I69320 Aphasia following cerebral infarction: Secondary | ICD-10-CM | POA: Diagnosis not present

## 2024-05-11 DIAGNOSIS — M6281 Muscle weakness (generalized): Secondary | ICD-10-CM | POA: Diagnosis not present

## 2024-05-11 DIAGNOSIS — R531 Weakness: Secondary | ICD-10-CM | POA: Diagnosis not present

## 2024-05-11 DIAGNOSIS — I69920 Aphasia following unspecified cerebrovascular disease: Secondary | ICD-10-CM | POA: Diagnosis not present

## 2024-05-11 DIAGNOSIS — I63232 Cerebral infarction due to unspecified occlusion or stenosis of left carotid arteries: Secondary | ICD-10-CM | POA: Diagnosis not present

## 2024-05-11 DIAGNOSIS — F5105 Insomnia due to other mental disorder: Secondary | ICD-10-CM | POA: Diagnosis not present

## 2024-05-11 DIAGNOSIS — F331 Major depressive disorder, recurrent, moderate: Secondary | ICD-10-CM | POA: Diagnosis not present

## 2024-05-11 DIAGNOSIS — Z7409 Other reduced mobility: Secondary | ICD-10-CM | POA: Diagnosis not present

## 2024-05-12 DIAGNOSIS — M6281 Muscle weakness (generalized): Secondary | ICD-10-CM | POA: Diagnosis not present

## 2024-05-12 DIAGNOSIS — I63232 Cerebral infarction due to unspecified occlusion or stenosis of left carotid arteries: Secondary | ICD-10-CM | POA: Diagnosis not present

## 2024-05-13 DIAGNOSIS — I63232 Cerebral infarction due to unspecified occlusion or stenosis of left carotid arteries: Secondary | ICD-10-CM | POA: Diagnosis not present

## 2024-05-13 DIAGNOSIS — M6281 Muscle weakness (generalized): Secondary | ICD-10-CM | POA: Diagnosis not present

## 2024-05-14 DIAGNOSIS — I63232 Cerebral infarction due to unspecified occlusion or stenosis of left carotid arteries: Secondary | ICD-10-CM | POA: Diagnosis not present

## 2024-05-14 DIAGNOSIS — M6281 Muscle weakness (generalized): Secondary | ICD-10-CM | POA: Diagnosis not present

## 2024-05-16 DIAGNOSIS — M6281 Muscle weakness (generalized): Secondary | ICD-10-CM | POA: Diagnosis not present

## 2024-05-16 DIAGNOSIS — I63232 Cerebral infarction due to unspecified occlusion or stenosis of left carotid arteries: Secondary | ICD-10-CM | POA: Diagnosis not present

## 2024-05-17 DIAGNOSIS — R5381 Other malaise: Secondary | ICD-10-CM | POA: Diagnosis not present

## 2024-05-17 DIAGNOSIS — R5383 Other fatigue: Secondary | ICD-10-CM | POA: Diagnosis not present

## 2024-05-17 DIAGNOSIS — I69320 Aphasia following cerebral infarction: Secondary | ICD-10-CM | POA: Diagnosis not present

## 2024-05-17 DIAGNOSIS — M6281 Muscle weakness (generalized): Secondary | ICD-10-CM | POA: Diagnosis not present

## 2024-05-17 DIAGNOSIS — Z7409 Other reduced mobility: Secondary | ICD-10-CM | POA: Diagnosis not present

## 2024-05-17 DIAGNOSIS — R531 Weakness: Secondary | ICD-10-CM | POA: Diagnosis not present

## 2024-05-17 DIAGNOSIS — I63232 Cerebral infarction due to unspecified occlusion or stenosis of left carotid arteries: Secondary | ICD-10-CM | POA: Diagnosis not present

## 2024-05-18 DIAGNOSIS — I63232 Cerebral infarction due to unspecified occlusion or stenosis of left carotid arteries: Secondary | ICD-10-CM | POA: Diagnosis not present

## 2024-05-18 DIAGNOSIS — M6281 Muscle weakness (generalized): Secondary | ICD-10-CM | POA: Diagnosis not present

## 2024-05-19 DIAGNOSIS — M6281 Muscle weakness (generalized): Secondary | ICD-10-CM | POA: Diagnosis not present

## 2024-05-19 DIAGNOSIS — I63232 Cerebral infarction due to unspecified occlusion or stenosis of left carotid arteries: Secondary | ICD-10-CM | POA: Diagnosis not present

## 2024-05-21 DIAGNOSIS — Z7409 Other reduced mobility: Secondary | ICD-10-CM | POA: Diagnosis not present

## 2024-05-21 DIAGNOSIS — I69320 Aphasia following cerebral infarction: Secondary | ICD-10-CM | POA: Diagnosis not present

## 2024-05-21 DIAGNOSIS — R531 Weakness: Secondary | ICD-10-CM | POA: Diagnosis not present

## 2024-05-22 DIAGNOSIS — M6281 Muscle weakness (generalized): Secondary | ICD-10-CM | POA: Diagnosis not present

## 2024-05-22 DIAGNOSIS — I63232 Cerebral infarction due to unspecified occlusion or stenosis of left carotid arteries: Secondary | ICD-10-CM | POA: Diagnosis not present

## 2024-05-23 DIAGNOSIS — M6281 Muscle weakness (generalized): Secondary | ICD-10-CM | POA: Diagnosis not present

## 2024-05-23 DIAGNOSIS — I63232 Cerebral infarction due to unspecified occlusion or stenosis of left carotid arteries: Secondary | ICD-10-CM | POA: Diagnosis not present

## 2024-05-24 ENCOUNTER — Emergency Department (HOSPITAL_COMMUNITY): Admission: EM | Admit: 2024-05-24 | Discharge: 2024-05-24 | Disposition: A | Discharge status: Home / Self Care

## 2024-05-24 ENCOUNTER — Emergency Department (HOSPITAL_COMMUNITY)

## 2024-05-24 ENCOUNTER — Other Ambulatory Visit: Payer: Self-pay

## 2024-05-24 DIAGNOSIS — G9389 Other specified disorders of brain: Secondary | ICD-10-CM | POA: Insufficient documentation

## 2024-05-24 DIAGNOSIS — R531 Weakness: Secondary | ICD-10-CM

## 2024-05-24 DIAGNOSIS — I10 Essential (primary) hypertension: Secondary | ICD-10-CM | POA: Diagnosis not present

## 2024-05-24 DIAGNOSIS — R4189 Other symptoms and signs involving cognitive functions and awareness: Secondary | ICD-10-CM | POA: Diagnosis not present

## 2024-05-24 DIAGNOSIS — R29810 Facial weakness: Secondary | ICD-10-CM | POA: Diagnosis not present

## 2024-05-24 DIAGNOSIS — Z7401 Bed confinement status: Secondary | ICD-10-CM | POA: Diagnosis not present

## 2024-05-24 DIAGNOSIS — I63232 Cerebral infarction due to unspecified occlusion or stenosis of left carotid arteries: Secondary | ICD-10-CM | POA: Diagnosis not present

## 2024-05-24 DIAGNOSIS — E876 Hypokalemia: Secondary | ICD-10-CM | POA: Insufficient documentation

## 2024-05-24 DIAGNOSIS — R4182 Altered mental status, unspecified: Secondary | ICD-10-CM | POA: Diagnosis not present

## 2024-05-24 DIAGNOSIS — N3000 Acute cystitis without hematuria: Secondary | ICD-10-CM

## 2024-05-24 DIAGNOSIS — F0390 Unspecified dementia without behavioral disturbance: Secondary | ICD-10-CM | POA: Insufficient documentation

## 2024-05-24 DIAGNOSIS — I69398 Other sequelae of cerebral infarction: Secondary | ICD-10-CM | POA: Insufficient documentation

## 2024-05-24 DIAGNOSIS — I700 Atherosclerosis of aorta: Secondary | ICD-10-CM | POA: Diagnosis not present

## 2024-05-24 DIAGNOSIS — I69351 Hemiplegia and hemiparesis following cerebral infarction affecting right dominant side: Secondary | ICD-10-CM | POA: Insufficient documentation

## 2024-05-24 DIAGNOSIS — R0602 Shortness of breath: Secondary | ICD-10-CM | POA: Diagnosis not present

## 2024-05-24 DIAGNOSIS — Z8673 Personal history of transient ischemic attack (TIA), and cerebral infarction without residual deficits: Secondary | ICD-10-CM | POA: Insufficient documentation

## 2024-05-24 DIAGNOSIS — R4701 Aphasia: Secondary | ICD-10-CM | POA: Diagnosis not present

## 2024-05-24 DIAGNOSIS — I63512 Cerebral infarction due to unspecified occlusion or stenosis of left middle cerebral artery: Secondary | ICD-10-CM | POA: Diagnosis not present

## 2024-05-24 DIAGNOSIS — R5383 Other fatigue: Secondary | ICD-10-CM | POA: Diagnosis present

## 2024-05-24 LAB — CBC WITH DIFFERENTIAL/PLATELET
Abs Immature Granulocytes: 0.06 K/uL (ref 0.00–0.07)
Basophils Absolute: 0.1 K/uL (ref 0.0–0.1)
Basophils Relative: 1 %
Eosinophils Absolute: 0.1 K/uL (ref 0.0–0.5)
Eosinophils Relative: 1 %
HCT: 36.3 % (ref 36.0–46.0)
Hemoglobin: 11.8 g/dL — ABNORMAL LOW (ref 12.0–15.0)
Immature Granulocytes: 1 %
Lymphocytes Relative: 21 %
Lymphs Abs: 2 K/uL (ref 0.7–4.0)
MCH: 22.4 pg — ABNORMAL LOW (ref 26.0–34.0)
MCHC: 32.5 g/dL (ref 30.0–36.0)
MCV: 69 fL — ABNORMAL LOW (ref 80.0–100.0)
Monocytes Absolute: 0.8 K/uL (ref 0.1–1.0)
Monocytes Relative: 8 %
Neutro Abs: 6.6 K/uL (ref 1.7–7.7)
Neutrophils Relative %: 68 %
Platelets: 275 K/uL (ref 150–400)
RBC: 5.26 MIL/uL — ABNORMAL HIGH (ref 3.87–5.11)
RDW: 16.6 % — ABNORMAL HIGH (ref 11.5–15.5)
Smear Review: NORMAL
WBC: 9.6 K/uL (ref 4.0–10.5)
nRBC: 0 % (ref 0.0–0.2)

## 2024-05-24 LAB — URINALYSIS, ROUTINE W REFLEX MICROSCOPIC
Bilirubin Urine: NEGATIVE
Glucose, UA: NEGATIVE mg/dL
Ketones, ur: NEGATIVE mg/dL
Nitrite: NEGATIVE
Protein, ur: 100 mg/dL — AB
Specific Gravity, Urine: 1.008 (ref 1.005–1.030)
WBC, UA: >50 WBC/hpf (ref 0–5)
pH: 5 (ref 5.0–8.0)

## 2024-05-24 LAB — TROPONIN I (HIGH SENSITIVITY)
Troponin I (High Sensitivity): 15 ng/L (ref <18)
Troponin I (High Sensitivity): 13 ng/L (ref <18)

## 2024-05-24 LAB — I-STAT CHEM 8, ED
BUN: 8 mg/dL (ref 8–23)
Calcium, Ion: 1.15 mmol/L (ref 1.15–1.40)
Chloride: 91 mmol/L — ABNORMAL LOW (ref 98–111)
Creatinine, Ser: 0.7 mg/dL (ref 0.44–1.00)
Glucose, Bld: 179 mg/dL — ABNORMAL HIGH (ref 70–99)
HCT: 39 % (ref 36.0–46.0)
Hemoglobin: 13.3 g/dL (ref 12.0–15.0)
Potassium: 2.5 mmol/L — CL (ref 3.5–5.1)
Sodium: 132 mmol/L — ABNORMAL LOW (ref 135–145)
TCO2: 26 mmol/L (ref 22–32)

## 2024-05-24 LAB — HEPATIC FUNCTION PANEL
ALT: 19 U/L (ref 0–44)
AST: 29 U/L (ref 15–41)
Albumin: 3.4 g/dL — ABNORMAL LOW (ref 3.5–5.0)
Alkaline Phosphatase: 94 U/L (ref 38–126)
Bilirubin, Direct: 0.1 mg/dL (ref 0.0–0.2)
Indirect Bilirubin: 0.3 mg/dL (ref 0.3–0.9)
Total Bilirubin: 0.4 mg/dL (ref 0.0–1.2)
Total Protein: 7.3 g/dL (ref 6.5–8.1)

## 2024-05-24 LAB — BASIC METABOLIC PANEL WITH GFR
Anion gap: 13 (ref 5–15)
BUN: 8 mg/dL (ref 8–23)
CO2: 22 mmol/L (ref 22–32)
Calcium: 7.8 mg/dL — ABNORMAL LOW (ref 8.9–10.3)
Chloride: 101 mmol/L (ref 98–111)
Creatinine, Ser: 0.68 mg/dL (ref 0.44–1.00)
GFR, Estimated: >60 mL/min (ref >60)
Glucose, Bld: 155 mg/dL — ABNORMAL HIGH (ref 70–99)
Potassium: 2.8 mmol/L — ABNORMAL LOW (ref 3.5–5.1)
Sodium: 136 mmol/L (ref 135–145)

## 2024-05-24 LAB — RESP PANEL BY RT-PCR (RSV, FLU A&B, COVID)  RVPGX2
Influenza A by PCR: NEGATIVE
Influenza B by PCR: NEGATIVE
Resp Syncytial Virus by PCR: NEGATIVE
SARS Coronavirus 2 by RT PCR: NEGATIVE

## 2024-05-24 LAB — BRAIN NATRIURETIC PEPTIDE: B Natriuretic Peptide: 4.3 pg/mL (ref 0.0–100.0)

## 2024-05-24 LAB — LIPASE, BLOOD: Lipase: 28 U/L (ref 11–51)

## 2024-05-24 MED ORDER — CEPHALEXIN 500 MG PO CAPS: 500.0000 mg | ORAL_CAPSULE | Freq: Three times a day (TID) | ORAL | 0 refills | Status: AC

## 2024-05-24 MED ORDER — SODIUM CHLORIDE 0.9 % IV SOLN
1.0000 g | Freq: Once | INTRAVENOUS | Status: AC
  Administered 2024-05-24: 1 g via INTRAVENOUS
  Filled 2024-05-24: qty 10

## 2024-05-24 MED ORDER — POTASSIUM CHLORIDE CRYS ER 20 MEQ PO TBCR: 40.0000 meq | EXTENDED_RELEASE_TABLET | Freq: Once | ORAL | 0 refills | Status: DC

## 2024-05-24 MED ORDER — POTASSIUM CHLORIDE 10 MEQ/100ML IV SOLN
10.0000 meq | INTRAVENOUS | Status: AC
  Administered 2024-05-24: 10 meq via INTRAVENOUS
  Filled 2024-05-24 (×2): qty 100

## 2024-05-24 MED ORDER — POTASSIUM CHLORIDE 20 MEQ PO PACK
40.0000 meq | PACK | Freq: Two times a day (BID) | ORAL | Status: DC
  Administered 2024-05-24: 40 meq via ORAL
  Filled 2024-05-24: qty 2

## 2024-05-24 MED ORDER — SODIUM CHLORIDE 0.9 % IV BOLUS
500.0000 mL | Freq: Once | INTRAVENOUS | Status: AC
  Administered 2024-05-24: 500 mL via INTRAVENOUS

## 2024-05-24 NOTE — ED Provider Notes (Signed)
 7:35 PM Patient signed out to me by previous ED physician. Pt is a 68 yo female with pmh of cva and encephalomalacia presenting for weakness/fatigue.  Hypokalemia 2.5. oral and iv going. Recheck pending.    Physical Exam  BP (!) 120/52 (BP Location: Right Arm)   Pulse 92   Temp 98.1 F (36.7 C) (Oral)   Resp 12   SpO2 97%   Physical Exam  Procedures  Procedures  ED Course / MDM    Medical Decision Making Amount and/or Complexity of Data Reviewed Labs: ordered. Radiology: ordered.  Risk Prescription drug management.   Repeat potassium 2.8.  Will plan for discharge with home potassium orders.  Follow-up with PCP in 1 week for repeat BMP.  Otherwise workup for weakness and fatigue stable.  No leukocytosis or signs of symptoms of sepsis.  Twelve-lead EKG and troponin stable.  Chest x-ray stable without infection.  UA demonstrates urinary tract infection.  Rocephin  given in ED and Keflex  sent to pharmacy.  Patient in no distress and overall condition improved here in the ED. Detailed discussions were had with the patient regarding current findings, and need for close f/u with PCP or on call doctor. The patient has been instructed to return immediately if the symptoms worsen in any way for re-evaluation. Patient verbalized understanding and is in agreement with current care plan. All questions answered prior to discharge.        Elnor Bernarda SQUIBB, DO 05/24/24 1935

## 2024-05-24 NOTE — ED Provider Notes (Signed)
 [place] EMERGENCY DEPARTMENT AT [facility] Provider Note   CSN: [id number] Arrival date & time: 05/24/24  1325     Patient presents with: Weakness and Fatigue   Bailey [name] is a 68 y.o. female.   67 year old female presents for evaluation of weakness and fatigue.  She appears to be at her baseline mental status.  She is a very poor historian.  Answers some questions appropriately.  Denies any pain.  Further history limited at this time.   Weakness      Prior to Admission medications   Medication Sig Start Date End Date Taking? Authorizing Provider  acetaminophen  (TYLENOL ) 325 MG tablet Take 2 tablets (650 mg total) by mouth every 6 (six) hours as needed (pain). 09/10/23   Danton Reyes DASEN, MD  alendronate  (FOSAMAX ) 70 MG tablet Take 1 tablet (70 mg total) by mouth every 7 (seven) days. Take with a full glass of water  on an empty stomach. 10/25/23   Danton Reyes DASEN, MD  amLODipine  (NORVASC ) 2.5 MG tablet Take 2.5 mg by mouth daily. 08/30/23   [provider]  atorvastatin  (LIPITOR) 40 MG tablet TAKE 1 TABLET EVERY DAY 12/12/21   Copland, Jessica C, MD  cholecalciferol  (VITAMIN D3) 25 MCG (1000 UNIT) tablet Take 1,000 Units by mouth at bedtime.    [provider]  ferrous sulfate  325 (65 FE) MG EC tablet Take 1 tablet (325 mg total) by mouth daily with breakfast. 09/10/23   Danton Reyes DASEN, MD  gabapentin  (NEURONTIN ) 100 MG capsule Take 100 mg by mouth at bedtime. 08/21/23   [provider]  LANTUS  100 UNIT/ML injection Inject 28 Units into the skin daily. 08/14/23   [provider]  levETIRAcetam  (KEPPRA ) 500 MG tablet Take 1 tablet (500 mg total) by mouth 2 (two) times daily. 09/10/23   Danton Reyes DASEN, MD  Multiple Vitamin (MULTIVITAMIN WITH MINERALS) TABS tablet Take 1 tablet by mouth daily. 05/29/22   Jerri Pfeiffer, MD  Omega 3 1000 MG CAPS Take 1 capsule by mouth at bedtime.    [provider]  oxybutynin   (DITROPAN -XL) 5 MG 24 hr tablet Take 5 mg by mouth daily. 08/31/23   [provider]  pantoprazole  (PROTONIX ) 40 MG tablet Take 1 tablet (40 mg total) by mouth 2 (two) times daily. 02/20/22   Copland, Jessica C, MD  senna-docusate (SENOKOT-S) 8.6-50 MG tablet Take 2 tablets by mouth at bedtime.    [provider]  sertraline  (ZOLOFT ) 100 MG tablet Take 100 mg by mouth daily. 08/21/23   [provider]    Allergies: Beef-derived drug products and Pork-derived products    Review of Systems  Reason unable to perform ROS: patient not responding to questions appropriately.  Neurological:  Positive for weakness.    Updated Vital Signs BP (!) 141/53   Pulse 84   Temp (!) 97.5 F (36.4 C) (Oral)   Resp 15   SpO2 96%   Physical Exam Vitals and nursing note reviewed.  Constitutional:      General: She is not in acute distress.    Appearance: She is well-developed. She is not ill-appearing.  HENT:     Head: Normocephalic and atraumatic.  Eyes:     Conjunctiva/sclera: Conjunctivae normal.  Cardiovascular:     Rate and Rhythm: Normal rate and regular rhythm.     Heart sounds: No murmur heard. Pulmonary:     Effort: Pulmonary effort is normal. No respiratory distress.     Breath sounds:  Normal breath sounds.  Abdominal:     Palpations: Abdomen is soft.     Tenderness: There is no abdominal tenderness.  Musculoskeletal:        General: No swelling.     Cervical back: Neck supple.  Skin:    General: Skin is warm and dry.     Capillary Refill: Capillary refill takes less than 2 seconds.  Neurological:     General: No focal deficit present.     Mental Status: She is alert.  Psychiatric:        Mood and Affect: Mood normal.     (all labs ordered are listed, but only abnormal results are displayed) Labs Reviewed  CBC WITH DIFFERENTIAL/PLATELET - Abnormal; Notable for the following components:      Result Value   RBC 5.26 (*)    Hemoglobin 11.8 (*)     MCV 69.0 (*)    MCH 22.4 (*)    RDW 16.6 (*)    All other components within normal limits  HEPATIC FUNCTION PANEL - Abnormal; Notable for the following components:   Albumin 3.4 (*)    All other components within normal limits  I-STAT CHEM 8, ED - Abnormal; Notable for the following components:   Sodium 132 (*)    Potassium 2.5 (*)    Chloride 91 (*)    Glucose, Bld 179 (*)    All other components within normal limits  RESP PANEL BY RT-PCR (RSV, FLU A&B, COVID)  RVPGX2  LIPASE, BLOOD  BRAIN NATRIURETIC PEPTIDE  URINALYSIS, ROUTINE W REFLEX MICROSCOPIC  BASIC METABOLIC PANEL WITH GFR  TROPONIN I (HIGH SENSITIVITY)  TROPONIN I (HIGH SENSITIVITY)    EKG: EKG Interpretation Date/Time:  Wednesday May 24 2024 14:30:54 EDT Ventricular Rate:  88 PR Interval:  178 QRS Duration:  98 QT Interval:  393 QTC Calculation: 476 R Axis:   1  Text Interpretation: Sinus rhythm Nonspecific T abnormalities, lateral leads Compared with prior EKG from 09/07/2023 Confirmed by Gennaro Bouchard ([id number]) on 05/24/2024 2:33:58 PM  Radiology: CT Head Wo Contrast Result Date: 05/24/2024 CLINICAL DATA:  Altered mental status EXAM: CT HEAD WITHOUT CONTRAST TECHNIQUE: Contiguous axial images were obtained from the base of the skull through the vertex without intravenous contrast. RADIATION DOSE REDUCTION: This exam was performed according to the departmental dose-optimization program which includes automated exposure control, adjustment of the mA and/or kV according to patient size and/or use of iterative reconstruction technique. COMPARISON:  09/04/2023 FINDINGS: Brain: Large area of infarction with encephalomalacia noted involving much of the left MCA territory and ACA territory, unchanged. No acute intracranial abnormality. Specifically, no hemorrhage, hydrocephalus, mass lesion, acute infarction, or significant intracranial injury. Vascular: No hyperdense vessel or unexpected calcification. Skull: No acute  calvarial abnormality. Sinuses/Orbits: Air-fluid level partially visualized in the left maxillary sinus. 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 No acute intracranial abnormality. Potential acute sinusitis in the left maxillary sinus. Electronically Signed   By: Franky Crease M.D.   On: 05/24/2024 14:28   DG Chest 1 View Result Date: 05/24/2024 CLINICAL DATA:  Shortness of breath. EXAM: CHEST  1 VIEW COMPARISON:  Chest radiograph dated 09/08/2023. FINDINGS: No focal consolidation, pleural effusion or pneumothorax. The cardiac silhouette is within limits. Atherosclerotic calcification of the aortic arch. No acute osseous pathology. IMPRESSION: No active disease. Electronically Signed   By: Vanetta Chou M.D.   On: 05/24/2024 14:25     Procedures   Medications Ordered in the  ED  potassium chloride  (KLOR-CON ) packet 40 mEq (has no administration in time range)  potassium chloride  10 mEq in 100 mL IVPB (has no administration in time range)  sodium chloride  0.9 % bolus 500 mL (500 mLs Intravenous New Bag/Given 05/24/24 1438)                                    Medical Decision Making Social determinants of health: Patient has had a stroke and is a poor historian/has dementia at baseline  Cardiac monitor interpretation: Sinus rhythm, no ectopy  Patient here for generalized weakness.  Found to have hypokalemia.  This was replaced IV and p.o.  Lab workup otherwise fairly unremarkable with troponins COVID and UA are pending.  CT head and chest x-ray are unremarkable for acute abnormality. Patient signed out to oncoming provider at 4:30 PM pending remainder of workup and ultimate disposition.   Problems Addressed: General weakness: acute illness or injury Hypokalemia: acute illness or injury  Amount and/or Complexity of Data Reviewed Independent Historian: EMS    Details: EMS provided history about patient's complaint of generalized  weakness and fatigue she is a poor historian External Data Reviewed: notes.    Details: Prior records reviewed and patient with history of stroke with encephalomalacia Labs: ordered. Decision-making details documented in ED Course.    Details: Ordered and reviewed by me and she does have hypokalemia, UA and COVID are pending at this time, first troponin is negative Radiology: ordered and independent interpretation performed. Decision-making details documented in ED Course.    Details: Chest x-ray ordered and interpreted by me independently of radiology  Chest x-ray shows no acute abnormality  CT head pending ECG/medicine tests: ordered and independent interpretation performed. Decision-making details documented in ED Course.    Details: Ordered and interpreted by me in the absence of cardiology and shows sinus rhythm, no STEMI no acute change when compared to prior  Risk OTC drugs. Prescription drug management. Drug therapy requiring intensive monitoring for toxicity. Diagnosis or treatment significantly limited by social determinants of health.     Final diagnoses:  Hypokalemia  General weakness    ED Discharge Orders     None          Gennaro Duwaine CROME, DO 05/24/24 1622

## 2024-05-24 NOTE — ED Notes (Signed)
 [facility] called

## 2024-05-24 NOTE — ED Notes (Signed)
 Patient arrives via EMS [name]  Coming from [employer]   Pt was sitting in her wheelchair at the nurses station and staff noticed she was unresponsive and slumped over to the right  Previous history stroke with R sided deficit  Non verbal except for Bailey [name] which is her baseline   Primarily seems to be complaints from staff of lethargy, weakness, but patient is responsive at this time to voice   BP 133/65 HR  88 RR 16 O2 99 on RA  CBG 191  20G Left forearms  No meds  EKG NSR

## 2024-05-24 NOTE — ED Notes (Signed)
 Patient transported to CT

## 2024-05-24 NOTE — Discharge Instructions (Addendum)
 Today you were diagnosed with hypokalemia which is a low potassium level.  The emergency department you received IV and oral potassium.  I have sent a prescription to your mail order service to receive potassium once daily for 5 days.  Please follow-up with your primary care physician in the next 5 to 7 days for repeat blood work to check your potassium levels.  You were also diagnosed with a urinary tract infection.  You received an antibiotic called Rocephin  prior to discharge and a prescription for Keflex  antibiotic was sent to your mail order pharmacy.

## 2024-05-24 NOTE — ED Notes (Signed)
 [facility]CMD called.

## 2024-05-25 DIAGNOSIS — N390 Urinary tract infection, site not specified: Secondary | ICD-10-CM | POA: Diagnosis not present

## 2024-05-25 DIAGNOSIS — E876 Hypokalemia: Secondary | ICD-10-CM | POA: Diagnosis not present

## 2024-05-25 DIAGNOSIS — R5383 Other fatigue: Secondary | ICD-10-CM | POA: Diagnosis not present

## 2024-05-25 DIAGNOSIS — R531 Weakness: Secondary | ICD-10-CM | POA: Diagnosis not present

## 2024-05-26 DIAGNOSIS — R531 Weakness: Secondary | ICD-10-CM | POA: Diagnosis not present

## 2024-05-26 DIAGNOSIS — Z7409 Other reduced mobility: Secondary | ICD-10-CM | POA: Diagnosis not present

## 2024-05-31 DIAGNOSIS — R531 Weakness: Secondary | ICD-10-CM | POA: Diagnosis not present

## 2024-05-31 DIAGNOSIS — E1140 Type 2 diabetes mellitus with diabetic neuropathy, unspecified: Secondary | ICD-10-CM | POA: Diagnosis not present

## 2024-05-31 DIAGNOSIS — R195 Other fecal abnormalities: Secondary | ICD-10-CM | POA: Diagnosis not present

## 2024-05-31 DIAGNOSIS — Z7409 Other reduced mobility: Secondary | ICD-10-CM | POA: Diagnosis not present

## 2024-05-31 DIAGNOSIS — N9089 Other specified noninflammatory disorders of vulva and perineum: Secondary | ICD-10-CM | POA: Diagnosis not present

## 2024-05-31 DIAGNOSIS — I69320 Aphasia following cerebral infarction: Secondary | ICD-10-CM | POA: Diagnosis not present

## 2024-06-01 DIAGNOSIS — R195 Other fecal abnormalities: Secondary | ICD-10-CM | POA: Diagnosis not present

## 2024-06-01 DIAGNOSIS — Z7409 Other reduced mobility: Secondary | ICD-10-CM | POA: Diagnosis not present

## 2024-06-01 DIAGNOSIS — R531 Weakness: Secondary | ICD-10-CM | POA: Diagnosis not present

## 2024-06-07 DIAGNOSIS — I152 Hypertension secondary to endocrine disorders: Secondary | ICD-10-CM | POA: Diagnosis not present

## 2024-06-07 DIAGNOSIS — F32A Depression, unspecified: Secondary | ICD-10-CM | POA: Diagnosis not present

## 2024-06-07 DIAGNOSIS — K5900 Constipation, unspecified: Secondary | ICD-10-CM | POA: Diagnosis not present

## 2024-06-07 DIAGNOSIS — E785 Hyperlipidemia, unspecified: Secondary | ICD-10-CM | POA: Diagnosis not present

## 2024-06-07 DIAGNOSIS — E1169 Type 2 diabetes mellitus with other specified complication: Secondary | ICD-10-CM | POA: Diagnosis not present

## 2024-06-07 DIAGNOSIS — D649 Anemia, unspecified: Secondary | ICD-10-CM | POA: Diagnosis not present

## 2024-06-07 DIAGNOSIS — I63232 Cerebral infarction due to unspecified occlusion or stenosis of left carotid arteries: Secondary | ICD-10-CM | POA: Diagnosis not present

## 2024-06-07 DIAGNOSIS — I69320 Aphasia following cerebral infarction: Secondary | ICD-10-CM | POA: Diagnosis not present

## 2024-06-07 DIAGNOSIS — R569 Unspecified convulsions: Secondary | ICD-10-CM | POA: Diagnosis not present

## 2024-06-07 DIAGNOSIS — I69391 Dysphagia following cerebral infarction: Secondary | ICD-10-CM | POA: Diagnosis not present

## 2024-06-07 DIAGNOSIS — Z8673 Personal history of transient ischemic attack (TIA), and cerebral infarction without residual deficits: Secondary | ICD-10-CM | POA: Diagnosis not present

## 2024-06-07 DIAGNOSIS — E1159 Type 2 diabetes mellitus with other circulatory complications: Secondary | ICD-10-CM | POA: Diagnosis not present

## 2024-06-08 DIAGNOSIS — G8929 Other chronic pain: Secondary | ICD-10-CM | POA: Diagnosis not present

## 2024-06-08 DIAGNOSIS — I152 Hypertension secondary to endocrine disorders: Secondary | ICD-10-CM | POA: Diagnosis not present

## 2024-06-08 DIAGNOSIS — E1159 Type 2 diabetes mellitus with other circulatory complications: Secondary | ICD-10-CM | POA: Diagnosis not present

## 2024-06-08 DIAGNOSIS — F5105 Insomnia due to other mental disorder: Secondary | ICD-10-CM | POA: Diagnosis not present

## 2024-06-08 DIAGNOSIS — F411 Generalized anxiety disorder: Secondary | ICD-10-CM | POA: Diagnosis not present

## 2024-06-08 DIAGNOSIS — I69391 Dysphagia following cerebral infarction: Secondary | ICD-10-CM | POA: Diagnosis not present

## 2024-06-08 DIAGNOSIS — I69320 Aphasia following cerebral infarction: Secondary | ICD-10-CM | POA: Diagnosis not present

## 2024-06-08 DIAGNOSIS — R531 Weakness: Secondary | ICD-10-CM | POA: Diagnosis not present

## 2024-06-08 DIAGNOSIS — Z7409 Other reduced mobility: Secondary | ICD-10-CM | POA: Diagnosis not present

## 2024-06-08 DIAGNOSIS — F331 Major depressive disorder, recurrent, moderate: Secondary | ICD-10-CM | POA: Diagnosis not present

## 2024-06-12 ENCOUNTER — Inpatient Hospital Stay (HOSPITAL_COMMUNITY)
Admission: EM | Admit: 2024-06-12 | Discharge: 2024-06-15 | DRG: 101 | Disposition: A | Discharge status: Skilled Nursing Facility | Source: Skilled Nursing Facility | Attending: Internal Medicine | Admitting: Internal Medicine

## 2024-06-12 ENCOUNTER — Other Ambulatory Visit: Payer: Self-pay

## 2024-06-12 ENCOUNTER — Emergency Department (HOSPITAL_COMMUNITY)

## 2024-06-12 DIAGNOSIS — Z8261 Family history of arthritis: Secondary | ICD-10-CM

## 2024-06-12 DIAGNOSIS — E785 Hyperlipidemia, unspecified: Secondary | ICD-10-CM | POA: Diagnosis present

## 2024-06-12 DIAGNOSIS — R569 Unspecified convulsions: Principal | ICD-10-CM

## 2024-06-12 DIAGNOSIS — R531 Weakness: Secondary | ICD-10-CM | POA: Diagnosis not present

## 2024-06-12 DIAGNOSIS — E871 Hypo-osmolality and hyponatremia: Secondary | ICD-10-CM | POA: Diagnosis present

## 2024-06-12 DIAGNOSIS — G4089 Other seizures: Secondary | ICD-10-CM | POA: Diagnosis not present

## 2024-06-12 DIAGNOSIS — Z91014 Allergy to mammalian meats: Secondary | ICD-10-CM

## 2024-06-12 DIAGNOSIS — Z800 Family history of malignant neoplasm of digestive organs: Secondary | ICD-10-CM

## 2024-06-12 DIAGNOSIS — J449 Chronic obstructive pulmonary disease, unspecified: Secondary | ICD-10-CM | POA: Diagnosis present

## 2024-06-12 DIAGNOSIS — Z87891 Personal history of nicotine dependence: Secondary | ICD-10-CM

## 2024-06-12 DIAGNOSIS — Z833 Family history of diabetes mellitus: Secondary | ICD-10-CM

## 2024-06-12 DIAGNOSIS — R519 Headache, unspecified: Secondary | ICD-10-CM | POA: Diagnosis not present

## 2024-06-12 DIAGNOSIS — Z7983 Long term (current) use of bisphosphonates: Secondary | ICD-10-CM

## 2024-06-12 DIAGNOSIS — W228XXA Striking against or struck by other objects, initial encounter: Secondary | ICD-10-CM | POA: Diagnosis present

## 2024-06-12 DIAGNOSIS — Z794 Long term (current) use of insulin: Secondary | ICD-10-CM | POA: Diagnosis not present

## 2024-06-12 DIAGNOSIS — E876 Hypokalemia: Secondary | ICD-10-CM | POA: Diagnosis not present

## 2024-06-12 DIAGNOSIS — I959 Hypotension, unspecified: Secondary | ICD-10-CM | POA: Diagnosis not present

## 2024-06-12 DIAGNOSIS — Z79899 Other long term (current) drug therapy: Secondary | ICD-10-CM

## 2024-06-12 DIAGNOSIS — G4489 Other headache syndrome: Secondary | ICD-10-CM | POA: Diagnosis not present

## 2024-06-12 DIAGNOSIS — S0990XA Unspecified injury of head, initial encounter: Secondary | ICD-10-CM | POA: Diagnosis present

## 2024-06-12 DIAGNOSIS — Z9049 Acquired absence of other specified parts of digestive tract: Secondary | ICD-10-CM

## 2024-06-12 DIAGNOSIS — E119 Type 2 diabetes mellitus without complications: Secondary | ICD-10-CM | POA: Diagnosis present

## 2024-06-12 DIAGNOSIS — I10 Essential (primary) hypertension: Secondary | ICD-10-CM | POA: Diagnosis present

## 2024-06-12 DIAGNOSIS — R4701 Aphasia: Secondary | ICD-10-CM | POA: Diagnosis not present

## 2024-06-12 DIAGNOSIS — Z8249 Family history of ischemic heart disease and other diseases of the circulatory system: Secondary | ICD-10-CM

## 2024-06-12 DIAGNOSIS — Z7401 Bed confinement status: Secondary | ICD-10-CM | POA: Diagnosis not present

## 2024-06-12 DIAGNOSIS — I69351 Hemiplegia and hemiparesis following cerebral infarction affecting right dominant side: Secondary | ICD-10-CM | POA: Diagnosis not present

## 2024-06-12 DIAGNOSIS — I69320 Aphasia following cerebral infarction: Secondary | ICD-10-CM

## 2024-06-12 DIAGNOSIS — G40909 Epilepsy, unspecified, not intractable, without status epilepticus: Principal | ICD-10-CM | POA: Diagnosis present

## 2024-06-12 DIAGNOSIS — Z823 Family history of stroke: Secondary | ICD-10-CM | POA: Diagnosis not present

## 2024-06-12 DIAGNOSIS — E860 Dehydration: Secondary | ICD-10-CM | POA: Diagnosis present

## 2024-06-12 DIAGNOSIS — Z993 Dependence on wheelchair: Secondary | ICD-10-CM

## 2024-06-12 DIAGNOSIS — Z841 Family history of disorders of kidney and ureter: Secondary | ICD-10-CM

## 2024-06-12 DIAGNOSIS — Z7409 Other reduced mobility: Secondary | ICD-10-CM | POA: Diagnosis not present

## 2024-06-12 DIAGNOSIS — N390 Urinary tract infection, site not specified: Secondary | ICD-10-CM | POA: Diagnosis present

## 2024-06-12 LAB — COMPREHENSIVE METABOLIC PANEL WITH GFR
ALT: 18 U/L (ref 0–44)
AST: 24 U/L (ref 15–41)
Albumin: 3.6 g/dL (ref 3.5–5.0)
Alkaline Phosphatase: 115 U/L (ref 38–126)
Anion gap: 16 — ABNORMAL HIGH (ref 5–15)
BUN: 7 mg/dL — ABNORMAL LOW (ref 8–23)
CO2: 22 mmol/L (ref 22–32)
Calcium: 9.2 mg/dL (ref 8.9–10.3)
Chloride: 87 mmol/L — ABNORMAL LOW (ref 98–111)
Creatinine, Ser: 0.85 mg/dL (ref 0.44–1.00)
GFR, Estimated: >60 mL/min (ref >60)
Glucose, Bld: 177 mg/dL — ABNORMAL HIGH (ref 70–99)
Potassium: 3 mmol/L — ABNORMAL LOW (ref 3.5–5.1)
Sodium: 125 mmol/L — ABNORMAL LOW (ref 135–145)
Total Bilirubin: 1 mg/dL (ref 0.0–1.2)
Total Protein: 7.7 g/dL (ref 6.5–8.1)

## 2024-06-12 LAB — BASIC METABOLIC PANEL WITH GFR
Anion gap: 16 — ABNORMAL HIGH (ref 5–15)
Anion gap: 16 — ABNORMAL HIGH (ref 5–15)
BUN: <5 mg/dL — ABNORMAL LOW (ref 8–23)
BUN: 8 mg/dL (ref 8–23)
CO2: 24 mmol/L (ref 22–32)
CO2: 22 mmol/L (ref 22–32)
Calcium: 9.4 mg/dL (ref 8.9–10.3)
Calcium: 9.2 mg/dL (ref 8.9–10.3)
Chloride: 87 mmol/L — ABNORMAL LOW (ref 98–111)
Chloride: 88 mmol/L — ABNORMAL LOW (ref 98–111)
Creatinine, Ser: 0.87 mg/dL (ref 0.44–1.00)
Creatinine, Ser: 0.86 mg/dL (ref 0.44–1.00)
GFR, Estimated: >60 mL/min (ref >60)
GFR, Estimated: >60 mL/min (ref >60)
Glucose, Bld: 171 mg/dL — ABNORMAL HIGH (ref 70–99)
Glucose, Bld: 208 mg/dL — ABNORMAL HIGH (ref 70–99)
Potassium: 2.8 mmol/L — ABNORMAL LOW (ref 3.5–5.1)
Potassium: 2.7 mmol/L — CL (ref 3.5–5.1)
Sodium: 127 mmol/L — ABNORMAL LOW (ref 135–145)
Sodium: 126 mmol/L — ABNORMAL LOW (ref 135–145)

## 2024-06-12 LAB — CBC WITH DIFFERENTIAL/PLATELET
Basophils Absolute: 0.3 K/uL — ABNORMAL HIGH (ref 0.0–0.1)
Basophils Relative: 2 %
Eosinophils Absolute: 0.1 K/uL (ref 0.0–0.5)
Eosinophils Relative: 1 %
HCT: 37.9 % (ref 36.0–46.0)
Hemoglobin: 12.3 g/dL (ref 12.0–15.0)
Lymphocytes Relative: 6 %
Lymphs Abs: 0.9 K/uL (ref 0.7–4.0)
MCH: 22.3 pg — ABNORMAL LOW (ref 26.0–34.0)
MCHC: 32.5 g/dL (ref 30.0–36.0)
MCV: 68.8 fL — ABNORMAL LOW (ref 80.0–100.0)
Monocytes Absolute: 0.7 K/uL (ref 0.1–1.0)
Monocytes Relative: 5 %
Neutro Abs: 12.3 K/uL — ABNORMAL HIGH (ref 1.7–7.7)
Neutrophils Relative %: 86 %
Platelets: 342 K/uL (ref 150–400)
RBC: 5.51 MIL/uL — ABNORMAL HIGH (ref 3.87–5.11)
RDW: 16.8 % — ABNORMAL HIGH (ref 11.5–15.5)
WBC: 14.3 K/uL — ABNORMAL HIGH (ref 4.0–10.5)
nRBC: 0 % (ref 0.0–0.2)

## 2024-06-12 LAB — URINALYSIS, W/ REFLEX TO CULTURE (INFECTION SUSPECTED)
Bilirubin Urine: NEGATIVE
Glucose, UA: NEGATIVE mg/dL
Ketones, ur: 5 mg/dL — AB
Nitrite: POSITIVE — AB
Protein, ur: 100 mg/dL — AB
RBC / HPF: >50 RBC/hpf (ref 0–5)
Specific Gravity, Urine: 1.01 (ref 1.005–1.030)
WBC, UA: >50 WBC/hpf (ref 0–5)
pH: 5 (ref 5.0–8.0)

## 2024-06-12 LAB — MAGNESIUM: Magnesium: 1.5 mg/dL — ABNORMAL LOW (ref 1.7–2.4)

## 2024-06-12 LAB — OSMOLALITY: Osmolality: 277 mosm/kg (ref 275–295)

## 2024-06-12 LAB — HIV ANTIBODY (ROUTINE TESTING W REFLEX): HIV Screen 4th Generation wRfx: NONREACTIVE

## 2024-06-12 MED ORDER — SODIUM CHLORIDE 0.9 % IV SOLN: INTRAVENOUS | Status: DC

## 2024-06-12 MED ORDER — MAGNESIUM SULFATE 2 GM/50ML IV SOLN
2.0000 g | Freq: Once | INTRAVENOUS | Status: AC
  Administered 2024-06-12: 2 g via INTRAVENOUS
  Filled 2024-06-12: qty 50

## 2024-06-12 MED ORDER — LEVETIRACETAM 500 MG PO TABS: 500.0000 mg | ORAL_TABLET | Freq: Two times a day (BID) | ORAL | Status: DC

## 2024-06-12 MED ORDER — POTASSIUM CHLORIDE 10 MEQ/100ML IV SOLN
10.0000 meq | INTRAVENOUS | Status: AC
  Administered 2024-06-12 (×3): 10 meq via INTRAVENOUS
  Filled 2024-06-12 (×3): qty 100

## 2024-06-12 MED ORDER — SODIUM CHLORIDE 0.9 % IV SOLN
1.0000 g | INTRAVENOUS | Status: DC
  Administered 2024-06-12 – 2024-06-14 (×3): 1 g via INTRAVENOUS
  Filled 2024-06-12 (×3): qty 10

## 2024-06-12 MED ORDER — AMLODIPINE BESYLATE 5 MG PO TABS
2.5000 mg | ORAL_TABLET | Freq: Every day | ORAL | Status: DC
Start: 2024-06-12 — End: 2024-06-13
  Administered 2024-06-12: 2.5 mg via ORAL
  Filled 2024-06-12: qty 1

## 2024-06-12 MED ORDER — ACETAMINOPHEN 325 MG PO TABS: 650.0000 mg | ORAL_TABLET | Freq: Four times a day (QID) | ORAL | Status: DC | PRN

## 2024-06-12 MED ORDER — GABAPENTIN 100 MG PO CAPS
100.0000 mg | ORAL_CAPSULE | Freq: Every day | ORAL | Status: DC
  Administered 2024-06-12 – 2024-06-14 (×3): 100 mg via ORAL
  Filled 2024-06-12 (×3): qty 1

## 2024-06-12 MED ORDER — ONDANSETRON HCL 4 MG PO TABS: 4.0000 mg | ORAL_TABLET | Freq: Four times a day (QID) | ORAL | Status: DC | PRN

## 2024-06-12 MED ORDER — LEVETIRACETAM (KEPPRA) 500 MG/5 ML ADULT IV PUSH
1000.0000 mg | Freq: Once | INTRAVENOUS | Status: AC
  Administered 2024-06-12: 1000 mg via INTRAVENOUS
  Filled 2024-06-12: qty 10

## 2024-06-12 MED ORDER — POTASSIUM CHLORIDE CRYS ER 20 MEQ PO TBCR
40.0000 meq | EXTENDED_RELEASE_TABLET | Freq: Once | ORAL | Status: AC
  Administered 2024-06-12: 40 meq via ORAL
  Filled 2024-06-12: qty 2

## 2024-06-12 MED ORDER — ONDANSETRON HCL 4 MG/2ML IJ SOLN
4.0000 mg | Freq: Four times a day (QID) | INTRAMUSCULAR | Status: DC | PRN
  Administered 2024-06-12: 4 mg via INTRAVENOUS
  Filled 2024-06-12: qty 2

## 2024-06-12 MED ORDER — LEVETIRACETAM 250 MG PO TABS
750.0000 mg | ORAL_TABLET | Freq: Two times a day (BID) | ORAL | Status: DC
  Administered 2024-06-12 – 2024-06-15 (×6): 750 mg via ORAL
  Filled 2024-06-12 (×6): qty 3

## 2024-06-12 MED ORDER — ACETAMINOPHEN 650 MG RE SUPP: 650.0000 mg | Freq: Four times a day (QID) | RECTAL | Status: DC | PRN

## 2024-06-12 NOTE — ED Notes (Signed)
 Pt incontinent of urine and stool at this time. Pt cleaned and fresh diaper placed.

## 2024-06-12 NOTE — ED Notes (Addendum)
 Lab at bedside to collect labs at request. I am attempting to get into this room to give medications, but was called off to CT to deal with an agitated patient from another room, and now I have a priority patient placed in another room that I must attend to first. 1807  Addendum: EMS arriving to another room at this time as well. 1818

## 2024-06-12 NOTE — ED Provider Notes (Signed)
 [place] EMERGENCY DEPARTMENT AT [facility] Provider Note   CSN: [id number] Arrival date & time: 06/12/24  1352     Patient presents with: Seizures and Head Injury   Bailey [name] is a 68 y.o. female.  {Add pertinent medical, surgical, social history, OB history to HPI:32947} HPI      68yo female with history of COPD, DM, htn, hlpd, hx of CVA with aphasia, right sided hemiparesis, history of seizure like activity on keppra , who presents with concern for seizure like activity.   Hx limited by aphasia Staff at [facility] noted witnessed focal seizure lasting 3-4 minutes and  then hit her head on the rail of the wall. Did not fall out of wheelchair.  Is on keppra .   Past Medical History:  Diagnosis Date   Arthritis    Cataract    COPD (chronic obstructive pulmonary disease) (HCC)    Diabetes mellitus without complication (HCC)    Fibrocystic breast disease July 2016   GERD (gastroesophageal reflux disease)    Hyperlipidemia    Hypertension      Prior to Admission medications   Medication Sig Start Date End Date Taking? Authorizing Provider  acetaminophen  (TYLENOL ) 325 MG tablet Take 2 tablets (650 mg total) by mouth every 6 (six) hours as needed (pain). 09/10/23   Danton Reyes DASEN, MD  alendronate  (FOSAMAX ) 70 MG tablet Take 1 tablet (70 mg total) by mouth every 7 (seven) days. Take with a full glass of water  on an empty stomach. 10/25/23   Danton Reyes DASEN, MD  amLODipine  (NORVASC ) 2.5 MG tablet Take 2.5 mg by mouth daily. 08/30/23   [provider]  atorvastatin  (LIPITOR) 40 MG tablet TAKE 1 TABLET EVERY DAY 12/12/21   Copland, Harlene BROCKS, MD  cholecalciferol  (VITAMIN D3) 25 MCG (1000 UNIT) tablet Take 1,000 Units by mouth at bedtime.    [provider]  ferrous sulfate  325 (65 FE) MG EC tablet Take 1 tablet (325 mg total) by mouth daily with breakfast. 09/10/23   Danton Reyes DASEN, MD  gabapentin  (NEURONTIN ) 100 MG capsule Take  100 mg by mouth at bedtime. 08/21/23   [provider]  LANTUS  100 UNIT/ML injection Inject 28 Units into the skin daily. 08/14/23   [provider]  levETIRAcetam  (KEPPRA ) 500 MG tablet Take 1 tablet (500 mg total) by mouth 2 (two) times daily. 09/10/23   Danton Reyes DASEN, MD  Multiple Vitamin (MULTIVITAMIN WITH MINERALS) TABS tablet Take 1 tablet by mouth daily. 05/29/22   Jerri Pfeiffer, MD  Omega 3 1000 MG CAPS Take 1 capsule by mouth at bedtime.    [provider]  oxybutynin  (DITROPAN -XL) 5 MG 24 hr tablet Take 5 mg by mouth daily. 08/31/23   [provider]  pantoprazole  (PROTONIX ) 40 MG tablet Take 1 tablet (40 mg total) by mouth 2 (two) times daily. 02/20/22   Copland, Jessica C, MD  potassium chloride  SA (KLOR-CON  M) 20 MEQ tablet Take 2 tablets (40 mEq total) by mouth once for 1 dose. 05/24/24 05/24/24  Elnor Hila P, DO  senna-docusate (SENOKOT-S) 8.6-50 MG tablet Take 2 tablets by mouth at bedtime.    [provider]  sertraline  (ZOLOFT ) 100 MG tablet Take 100 mg by mouth daily. 08/21/23   [provider]    Allergies: Beef-derived drug products and Pork-derived products    Review of Systems  Updated Vital Signs BP (!) 145/70   Pulse 95   Temp (!) 97.5 F (36.4 C) (  Axillary)   Resp 16   SpO2 100%   Physical Exam  (all labs ordered are listed, but only abnormal results are displayed) Labs Reviewed  CBC WITH DIFFERENTIAL/PLATELET - Abnormal; Notable for the following components:      Result Value   WBC 14.3 (*)    RBC 5.51 (*)    MCV 68.8 (*)    MCH 22.3 (*)    RDW 16.8 (*)    Neutro Abs 12.3 (*)    Basophils Absolute 0.3 (*)    All other components within normal limits  COMPREHENSIVE METABOLIC PANEL WITH GFR - Abnormal; Notable for the following components:   Sodium 125 (*)    Potassium 3.0 (*)    Chloride 87 (*)    Glucose, Bld 177 (*)    BUN 7 (*)    Anion gap 16 (*)    All other components within normal limits   URINALYSIS, W/ REFLEX TO CULTURE (INFECTION SUSPECTED)  LEVETIRACETAM  LEVEL    EKG: None  Radiology: CT Head Wo Contrast Result Date: 06/12/2024 CLINICAL DATA:  Head trauma. EXAM: CT HEAD WITHOUT CONTRAST TECHNIQUE: Contiguous axial images were obtained from the base of the skull through the vertex without intravenous contrast. RADIATION DOSE REDUCTION: This exam was performed according to the departmental dose-optimization program which includes automated exposure control, adjustment of the mA and/or kV according to patient size and/or use of iterative reconstruction technique. COMPARISON:  05/24/2024 FINDINGS: Brain: Ventricles and cisterns are unremarkable. Mild age related atrophic change. Old large left MCA territory infarct. Mild chronic ischemic microvascular disease. No mass, mass effect, shift of midline structures or acute hemorrhage. No evidence of acute infarction. Vascular: No hyperdense vessel or unexpected calcification. Skull: Normal. Negative for fracture or focal lesion. Sinuses/Orbits: Orbits are normal. Mild opacification over the left maxillary sinus unchanged. Other: None. IMPRESSION: 1. No acute findings. 2. Old large left MCA territory infarct. Mild chronic ischemic microvascular disease. 3. Stable inflammatory change left maxillary sinus. Electronically Signed   By: Toribio Agreste M.D.   On: 06/12/2024 16:18    {Document cardiac monitor, telemetry assessment procedure when appropriate:32947} Procedures   Medications Ordered in the ED  levETIRAcetam  (KEPPRA ) undiluted injection 1,000 mg (1,000 mg Intravenous Given 06/12/24 1559)      {Click here for ABCD2, HEART and other calculators REFRESH Note before signing:1}                               68yo female with history of COPD, DM, htn, hlpd, hx of CVA with aphasia, right sided hemiparesis, history of seizure like activity on keppra , who presents with concern for seizure like activity.   Given hx of head trauma, CT  Head completed shows no evidence of acute ICH.     Will increase to 750mg  BID keppra   {Document critical care time when appropriate  Document review of labs and clinical decision tools ie [id number], etc  Document your independent review of radiology images and any outside records  Document your discussion with family members, caretakers and with consultants  Document social determinants of health affecting pt's care  Document your decision making why or why not admission, treatments were needed:32947:::1}   Final diagnoses:  None    ED Discharge Orders     None

## 2024-06-12 NOTE — H&P (Addendum)
 History and Physical    Bailey [name] FMW:[id number] DOB: 10-30-1955 DOA: 06/12/2024  I have briefly reviewed the patient's prior medical records in [facility]  PCP: Richrd Randine DEL, MD  Patient coming from: SNF  Chief Complaint: seizure  HPI: Bailey [name] is a 68 y.o. female with medical history significant of CVA, anemia, vitamin D  deficiency, diabetes, hypertension.  Patient was brought in via EMS from [facility] health care with reports of a witnessed focal seizure of 3 to 4 minutes.  During the seizure patient hit her head on the side rail of the wall but did not fall out of the wheelchair.  Patient is on Keppra  that was started about 1 year ago:  From d/c summary: Seizure-like activity CT head negative -Spot EEG noted cortical dysfunction from the left hemisphere consistent with her prior CVA but revealed no evidence of seizure/epileptiform discharges -she did have recurrent episodes during her hospital stay, prompting initiation of long-term EEG and empiric Keppra  initiation -long-term EEG did not capture clear seizure activity - MRI brain was accomplished which revealed no acute intracranial abnormality - Neurology ultimately suggested continuing Keppra  at 500 mg twice daily empirically   At baseline patient is aphasic and weak on the right side. Patient recently was seen in the [facility], ER on 9/2 with hypokalemia and UTI.  She was treated with 40 mill equivalents of K-Dur and Keflex .  In the ER, she was found to have a low sodium of 125 and potassium of 3.  WBC count was elevated at 14.3.  ER physician was going to collect a urinalysis to rule out UTI and consult neurology for recommendations regarding seizure medicines.  CT scan of head showed old large CVA   Review of Systems: As per HPI otherwise 10 point review of systems negative.   Past Medical History:  Diagnosis Date   Arthritis    Cataract    COPD (chronic obstructive pulmonary disease) (HCC)     Diabetes mellitus without complication (HCC)    Fibrocystic breast disease July 2016   GERD (gastroesophageal reflux disease)    Hyperlipidemia    Hypertension     Past Surgical History:  Procedure Laterality Date   BREAST BIOPSY Right 04/19/2015   benign   BREAST BIOPSY Right 11/01/2020   fibroadenoma   CATARACT EXTRACTION     CHOLECYSTECTOMY     COLONOSCOPY     GALLBLADDER SURGERY     IR ANGIO INTRA EXTRACRAN SEL COM CAROTID INNOMINATE UNI R MOD SED  05/16/2022   IR ANGIO VERTEBRAL SEL VERTEBRAL UNI L MOD SED  05/16/2022   IR CT HEAD LTD  05/16/2022   IR CT HEAD LTD  05/16/2022   IR CT HEAD LTD  05/16/2022   IR INTRAVSC STENT CERV CAROTID W/O EMB-PROT MOD SED INC ANGIO  05/16/2022   IR PERCUTANEOUS ART THROMBECTOMY/INFUSION INTRACRANIAL INC DIAG ANGIO  05/16/2022   RADIOLOGY WITH ANESTHESIA N/A 05/15/2022   Procedure: IR WITH ANESTHESIA;  Surgeon: Radiologist, Medication, MD;  Location: [facility] OR;  Service: Radiology;  Laterality: N/A;   UTERINE FIBROID SURGERY       reports that she has been smoking cigarettes. She has a 34 pack-year smoking history. She has been exposed to tobacco smoke. She has never used smokeless tobacco. . She reports that she does not use drugs.  Allergies  Allergen Reactions   Beef-Derived Drug Products     Unknown reaction   Pork-Derived Products     Unknown reaction  Family History  Problem Relation Age of Onset   Diabetes Mother    Stroke Mother    Dementia Mother    Heart disease Father    Diabetes Father    Diabetes Brother    Dementia Brother    Renal Disease Brother    Diabetes Sister    Narcolepsy Sister    Arthritis Sister    Diabetes Sister    Obesity Sister    Diabetes Brother    Heart disease Brother    Cancer Brother        pancreatic   Diabetes Brother    Diabetes Brother    Stomach cancer Sister    Colon cancer Neg Hx    Esophageal cancer Neg Hx    Rectal cancer Neg Hx     Prior to Admission medications   Medication  Sig Start Date End Date Taking? Authorizing Provider  acetaminophen  (TYLENOL ) 325 MG tablet Take 2 tablets (650 mg total) by mouth every 6 (six) hours as needed (pain). 09/10/23   Danton Reyes DASEN, MD  alendronate  (FOSAMAX ) 70 MG tablet Take 1 tablet (70 mg total) by mouth every 7 (seven) days. Take with a full glass of water  on an empty stomach. 10/25/23   Danton Reyes DASEN, MD  amLODipine  (NORVASC ) 2.5 MG tablet Take 2.5 mg by mouth daily. 08/30/23   [provider]  atorvastatin  (LIPITOR) 40 MG tablet TAKE 1 TABLET EVERY DAY 12/12/21   Copland, Harlene BROCKS, MD  cholecalciferol  (VITAMIN D3) 25 MCG (1000 UNIT) tablet Take 1,000 Units by mouth at bedtime.    [provider]  ferrous sulfate  325 (65 FE) MG EC tablet Take 1 tablet (325 mg total) by mouth daily with breakfast. 09/10/23   Danton Reyes DASEN, MD  gabapentin  (NEURONTIN ) 100 MG capsule Take 100 mg by mouth at bedtime. 08/21/23   [provider]  LANTUS  100 UNIT/ML injection Inject 28 Units into the skin daily. 08/14/23   [provider]  levETIRAcetam  (KEPPRA ) 500 MG tablet Take 1 tablet (500 mg total) by mouth 2 (two) times daily. 09/10/23   Danton Reyes DASEN, MD  Multiple Vitamin (MULTIVITAMIN WITH MINERALS) TABS tablet Take 1 tablet by mouth daily. 05/29/22   Jerri Pfeiffer, MD  Omega 3 1000 MG CAPS Take 1 capsule by mouth at bedtime.    [provider]  oxybutynin  (DITROPAN -XL) 5 MG 24 hr tablet Take 5 mg by mouth daily. 08/31/23   [provider]  pantoprazole  (PROTONIX ) 40 MG tablet Take 1 tablet (40 mg total) by mouth 2 (two) times daily. 02/20/22   Copland, Kierah Goatley C, MD  potassium chloride  SA (KLOR-CON  M) 20 MEQ tablet Take 2 tablets (40 mEq total) by mouth once for 1 dose. 05/24/24 05/24/24  Elnor Hila P, DO  senna-docusate (SENOKOT-S) 8.6-50 MG tablet Take 2 tablets by mouth at bedtime.    [provider]  sertraline  (ZOLOFT ) 100 MG tablet Take 100 mg by mouth daily. 08/21/23    [provider]    Physical Exam: Vitals:   06/12/24 1406  BP: (!) 145/70  Pulse: 95  Resp: 16  Temp: (!) 97.5 F (36.4 C)  TempSrc: Axillary  SpO2: 100%      Constitutional: NAD, calm, comfortable Neck: normal, supple, no masses, no thyromegaly Respiratory: clear to auscultation bilaterally, no wheezing, no crackles. Normal respiratory effort. No accessory muscle use.  Cardiovascular: Regular rate and rhythm, no murmurs / rubs / gallops. No extremity edema. 2+ pedal pulses.  Abdomen:  no tenderness, no masses palpated. Bowel sounds positive.  Musculoskeletal: no clubbing / cyanosis. Normal muscle tone.  Skin: no rashes, lesions, ulcers. No induration Neurologic: Weakness on the right side with contractures Psychiatric: Awake but nonverbal-will appropriately laugh and smile when spoken to   Labs on Admission: I have personally reviewed following labs and imaging studies  CBC: Recent Labs  Lab 06/12/24 1511  WBC 14.3*  NEUTROABS 12.3*  HGB 12.3  HCT 37.9  MCV 68.8*  PLT 342   Basic Metabolic Panel: Recent Labs  Lab 06/12/24 1511  NA 125*  K 3.0*  CL 87*  CO2 22  GLUCOSE 177*  BUN 7*  CREATININE 0.85  CALCIUM  9.2   GFR: CrCl cannot be calculated (Unknown ideal weight.). Liver Function Tests: Recent Labs  Lab 06/12/24 1511  AST 24  ALT 18  ALKPHOS 115  BILITOT 1.0  PROT 7.7  ALBUMIN 3.6   No results for input(s): LIPASE, AMYLASE in the last 168 hours. No results for input(s): AMMONIA in the last 168 hours. Coagulation Profile: No results for input(s): INR, PROTIME in the last 168 hours. Cardiac Enzymes: No results for input(s): CKTOTAL, CKMB, CKMBINDEX, TROPONINI in the last 168 hours. BNP (last 3 results) No results for input(s): PROBNP in the last 8760 hours. HbA1C: No results for input(s): HGBA1C in the last 72 hours. CBG: No results for input(s): GLUCAP in the last 168 hours. Lipid Profile: No results for  input(s): CHOL, HDL, LDLCALC, TRIG, CHOLHDL, LDLDIRECT in the last 72 hours. Thyroid  Function Tests: No results for input(s): TSH, T4TOTAL, FREET4, T3FREE, THYROIDAB in the last 72 hours. Anemia Panel: No results for input(s): VITAMINB12, FOLATE, FERRITIN, TIBC, IRON , RETICCTPCT in the last 72 hours. Urine analysis:    Component Value Date/Time   COLORURINE YELLOW 05/24/2024 1345   APPEARANCEUR TURBID (A) 05/24/2024 1345   LABSPEC 1.008 05/24/2024 1345   PHURINE 5.0 05/24/2024 1345   GLUCOSEU NEGATIVE 05/24/2024 1345   HGBUR MODERATE (A) 05/24/2024 1345   BILIRUBINUR NEGATIVE 05/24/2024 1345   BILIRUBINUR Negative 04/23/2022 0953   KETONESUR NEGATIVE 05/24/2024 1345   PROTEINUR 100 (A) 05/24/2024 1345   UROBILINOGEN 0.2 04/23/2022 0953   UROBILINOGEN 0.2 02/05/2015 0322   NITRITE NEGATIVE 05/24/2024 1345   LEUKOCYTESUR LARGE (A) 05/24/2024 1345     Radiological Exams on Admission: CT Head Wo Contrast Result Date: 06/12/2024 CLINICAL DATA:  Head trauma. EXAM: CT HEAD WITHOUT CONTRAST TECHNIQUE: Contiguous axial images were obtained from the base of the skull through the vertex without intravenous contrast. RADIATION DOSE REDUCTION: This exam was performed according to the departmental dose-optimization program which includes automated exposure control, adjustment of the mA and/or kV according to patient size and/or use of iterative reconstruction technique. COMPARISON:  05/24/2024 FINDINGS: Brain: Ventricles and cisterns are unremarkable. Mild age related atrophic change. Old large left MCA territory infarct. Mild chronic ischemic microvascular disease. No mass, mass effect, shift of midline structures or acute hemorrhage. No evidence of acute infarction. Vascular: No hyperdense vessel or unexpected calcification. Skull: Normal. Negative for fracture or focal lesion. Sinuses/Orbits: Orbits are normal. Mild opacification over the left maxillary sinus  unchanged. Other: None. IMPRESSION: 1. No acute findings. 2. Old large left MCA territory infarct. Mild chronic ischemic microvascular disease. 3. Stable inflammatory change left maxillary sinus. Electronically Signed   By: Toribio Agreste M.D.   On: 06/12/2024 16:18      Assessment/Plan Principal Problem:   Seizures (HCC)    Seizures - On Keppra  500 twice daily  at facility- see above - Seizure precautions - Neurology consult for adjustment in meds  Hyponatremia - Not convinced this caused a seizure so we will treat with IV fluids and every 4 BMP as to not raise the sodium too quickly - Will monitor in progressive care unit for now -Check urine studies/Osmo's  Rule out UTI UA and culture pending - Recently treated with Keflex   History of CVA - PT OT eval as from long-term facility and cover healthcare  Hypokalemia Replete - Check magnesium   Diabetes type 2 - Sliding scale insulin  for now, may need to resume long-acting pending blood sugars  Hypertension - Resume home meds as needed  DVT prophylaxis: Lovenox  Code Status: Full code Family Communication: Son at bedside Disposition Plan: Back to facility Consults called: Neurology called by ER Dr.     Harlene RAYMOND Bowl [facility]   How to contact the [facility] Attending or Consulting provider 7A - 7P or covering provider during after hours 7P -7A, for this patient?  Check the care team in [facility] and look for a) attending/consulting [facility] provider listed and b) the TRH team listed Log into [email or web address] and use [facility] universal password to access. If you do not have the password, please contact the hospital operator. Locate the [facility] provider you are looking for under [facility] and page to a number that you can be directly reached. If you still have difficulty reaching the provider, please page the [facility] (Director on Call) for the Hospitalists listed on amion for assistance.  06/12/2024, 4:34 PM

## 2024-06-12 NOTE — ED Triage Notes (Signed)
 [facility] bring pt from [facility] patient had a witnessed focal seizure which lasted 3-4 mins and then her head hit the side rail of the wall, although she remained in her wheelchair during the event. Pt has hx of seizures and is on keppra . Pt endorses headache with a nod of the head but has severe expressive aphasia at baseline from previous stroke. R sided weakness at baseline as well.   EMS vitals: BP 152/66 HR 92 O2 99 on RA CBG 207  No meds/ivs/EKG by EMS

## 2024-06-13 DIAGNOSIS — R569 Unspecified convulsions: Secondary | ICD-10-CM | POA: Diagnosis not present

## 2024-06-13 LAB — GLUCOSE, CAPILLARY
Glucose-Capillary: 211 mg/dL — ABNORMAL HIGH (ref 70–99)
Glucose-Capillary: 155 mg/dL — ABNORMAL HIGH (ref 70–99)
Glucose-Capillary: 152 mg/dL — ABNORMAL HIGH (ref 70–99)

## 2024-06-13 LAB — LEVETIRACETAM LEVEL: Levetiracetam Lvl: 38.9 ug/mL (ref 10.0–40.0)

## 2024-06-13 LAB — CBC
HCT: 35.2 % — ABNORMAL LOW (ref 36.0–46.0)
Hemoglobin: 11.5 g/dL — ABNORMAL LOW (ref 12.0–15.0)
MCH: 22 pg — ABNORMAL LOW (ref 26.0–34.0)
MCHC: 32.7 g/dL (ref 30.0–36.0)
MCV: 67.4 fL — ABNORMAL LOW (ref 80.0–100.0)
Platelets: 335 K/uL (ref 150–400)
RBC: 5.22 MIL/uL — ABNORMAL HIGH (ref 3.87–5.11)
RDW: 16.4 % — ABNORMAL HIGH (ref 11.5–15.5)
WBC: 13.5 K/uL — ABNORMAL HIGH (ref 4.0–10.5)
nRBC: 0 % (ref 0.0–0.2)

## 2024-06-13 LAB — BASIC METABOLIC PANEL WITH GFR
Anion gap: 12 (ref 5–15)
Anion gap: 17 — ABNORMAL HIGH (ref 5–15)
BUN: 6 mg/dL — ABNORMAL LOW (ref 8–23)
BUN: 8 mg/dL (ref 8–23)
CO2: 22 mmol/L (ref 22–32)
CO2: 25 mmol/L (ref 22–32)
Calcium: 9.2 mg/dL (ref 8.9–10.3)
Calcium: 9.2 mg/dL (ref 8.9–10.3)
Chloride: 87 mmol/L — ABNORMAL LOW (ref 98–111)
Chloride: 90 mmol/L — ABNORMAL LOW (ref 98–111)
Creatinine, Ser: 0.78 mg/dL (ref 0.44–1.00)
Creatinine, Ser: 0.81 mg/dL (ref 0.44–1.00)
GFR, Estimated: >60 mL/min (ref >60)
GFR, Estimated: >60 mL/min (ref >60)
Glucose, Bld: 164 mg/dL — ABNORMAL HIGH (ref 70–99)
Glucose, Bld: 188 mg/dL — ABNORMAL HIGH (ref 70–99)
Potassium: 3.3 mmol/L — ABNORMAL LOW (ref 3.5–5.1)
Potassium: 3.7 mmol/L (ref 3.5–5.1)
Sodium: 126 mmol/L — ABNORMAL LOW (ref 135–145)
Sodium: 127 mmol/L — ABNORMAL LOW (ref 135–145)

## 2024-06-13 LAB — URIC ACID: Uric Acid, Serum: 7.8 mg/dL — ABNORMAL HIGH (ref 2.5–7.1)

## 2024-06-13 LAB — OSMOLALITY: Osmolality: 277 mosm/kg (ref 275–295)

## 2024-06-13 LAB — SODIUM, URINE, RANDOM: Sodium, Ur: <30 mmol/L

## 2024-06-13 LAB — OSMOLALITY, URINE: Osmolality, Ur: 210 mosm/kg — ABNORMAL LOW (ref 300–900)

## 2024-06-13 MED ORDER — INSULIN GLARGINE 100 UNIT/ML ~~LOC~~ SOLN
20.0000 [IU] | Freq: Every day | SUBCUTANEOUS | Status: DC
  Administered 2024-06-13 – 2024-06-15 (×3): 20 [IU] via SUBCUTANEOUS
  Filled 2024-06-13 (×3): qty 0.2

## 2024-06-13 MED ORDER — INSULIN ASPART 100 UNIT/ML IJ SOLN
0.0000 [IU] | Freq: Three times a day (TID) | INTRAMUSCULAR | Status: DC
  Administered 2024-06-13: 2 [IU] via SUBCUTANEOUS
  Administered 2024-06-13: 3 [IU] via SUBCUTANEOUS
  Administered 2024-06-14 – 2024-06-15 (×4): 1 [IU] via SUBCUTANEOUS

## 2024-06-13 MED ORDER — VITAMIN D 25 MCG (1000 UNIT) PO TABS
1000.0000 [IU] | ORAL_TABLET | Freq: Every day | ORAL | Status: DC
  Administered 2024-06-13 – 2024-06-15 (×3): 1000 [IU] via ORAL
  Filled 2024-06-13 (×3): qty 1

## 2024-06-13 MED ORDER — SODIUM CHLORIDE 0.9 % IV SOLN
INTRAVENOUS | Status: DC
Start: 2024-06-13 — End: 2024-06-14

## 2024-06-13 MED ORDER — HYDRALAZINE HCL 20 MG/ML IJ SOLN: 10.0000 mg | Freq: Four times a day (QID) | INTRAMUSCULAR | Status: DC | PRN

## 2024-06-13 MED ORDER — MAGNESIUM SULFATE 2 GM/50ML IV SOLN
2.0000 g | Freq: Once | INTRAVENOUS | Status: AC
  Administered 2024-06-13: 2 g via INTRAVENOUS
  Filled 2024-06-13: qty 50

## 2024-06-13 MED ORDER — SERTRALINE HCL 50 MG PO TABS
75.0000 mg | ORAL_TABLET | Freq: Every day | ORAL | Status: DC
  Administered 2024-06-13 – 2024-06-15 (×3): 75 mg via ORAL
  Filled 2024-06-13 (×3): qty 1

## 2024-06-13 MED ORDER — INSULIN ASPART 100 UNIT/ML IJ SOLN: 0.0000 [IU] | Freq: Every day | INTRAMUSCULAR | Status: DC

## 2024-06-13 NOTE — Evaluation (Signed)
 Clinical/Bedside Swallow Evaluation Patient Details  Name: Bailey [name] MRN: [id number] Date of Birth: 15-Mar-1956  Today's Date: 06/13/2024 Time: SLP Start Time (ACUTE ONLY): 0915 SLP Stop Time (ACUTE ONLY): 0930 SLP Time Calculation (min) (ACUTE ONLY): 15 min  Past Medical History:  Past Medical History:  Diagnosis Date   Arthritis    Cataract    COPD (chronic obstructive pulmonary disease) (HCC)    Diabetes mellitus without complication (HCC)    Fibrocystic breast disease July 2016   GERD (gastroesophageal reflux disease)    Hyperlipidemia    Hypertension    Past Surgical History:  Past Surgical History:  Procedure Laterality Date   BREAST BIOPSY Right 04/19/2015   benign   BREAST BIOPSY Right 11/01/2020   fibroadenoma   CATARACT EXTRACTION     CHOLECYSTECTOMY     COLONOSCOPY     GALLBLADDER SURGERY     IR ANGIO INTRA EXTRACRAN SEL COM CAROTID INNOMINATE UNI R MOD SED  05/16/2022   IR ANGIO VERTEBRAL SEL VERTEBRAL UNI L MOD SED  05/16/2022   IR CT HEAD LTD  05/16/2022   IR CT HEAD LTD  05/16/2022   IR CT HEAD LTD  05/16/2022   IR INTRAVSC STENT CERV CAROTID W/O EMB-PROT MOD SED INC ANGIO  05/16/2022   IR PERCUTANEOUS ART THROMBECTOMY/INFUSION INTRACRANIAL INC DIAG ANGIO  05/16/2022   RADIOLOGY WITH ANESTHESIA N/A 05/15/2022   Procedure: IR WITH ANESTHESIA;  Surgeon: Radiologist, Medication, MD;  Location: [facility] OR;  Service: Radiology;  Laterality: N/A;   UTERINE FIBROID SURGERY     HPI:  Bailey [name] is a 68 y.o. female with medical history significant of CVA, anemia, vitamin D  deficiency, diabetes, hypertension.  Patient was brought in via EMS from [facility] health care with reports of a witnessed focal seizure of 3 to 4 minutes.  During the seizure patient hit her head on the side rail of the wall but did not fall out of the wheelchair.  Patient is on Keppra  that was started about 1 year ago:MRI negative    Assessment / Plan / Recommendation  Clinical Impression   Pt alert, with severe expressive aphasia with some receptive language ability. She is able to self feed straw sips of water  and regular solids without dysphagia. Pt uses gestures and prosody to tell me there is nothing wrong with her, though her words are complete stereotypy su su su. Pt may continue regular diet and thin liquids. Will sign off as pt is at baseline function. SLP Visit Diagnosis: Dysphagia, unspecified (R13.10)    Aspiration Risk  Mild aspiration risk    Diet Recommendation Regular;Thin liquid    Liquid Administration via: Cup;Straw Medication Administration: Whole meds with liquid Supervision: Patient able to self feed Postural Changes: Seated upright at 90 degrees    Other  Recommendations Oral Care Recommendations: Oral care BID     Assistance Recommended at Discharge    Functional Status Assessment    Frequency and Duration            Prognosis        Swallow Study   General HPI: Bailey [name] is a 68 y.o. female with medical history significant of CVA, anemia, vitamin D  deficiency, diabetes, hypertension.  Patient was brought in via EMS from [facility] health care with reports of a witnessed focal seizure of 3 to 4 minutes.  During the seizure patient hit her head on the side rail of the wall but did not fall out of the wheelchair.  Patient is on Keppra  that was started about 1 year ago:MRI negative Type of Study: Bedside Swallow Evaluation Previous Swallow Assessment: 2023- puree, thin Diet Prior to this Study: Regular;Thin liquids (Level 0) Temperature Spikes Noted: No Respiratory Status: Room air History of Recent Intubation: No Behavior/Cognition: Alert;Cooperative;Requires cueing Oral Cavity Assessment: Within Functional Limits Oral Care Completed by SLP: No Oral Cavity - Dentition: Adequate natural dentition Vision: Functional for self-feeding Self-Feeding Abilities: Able to feed self Patient Positioning: Upright in bed Baseline Vocal  Quality: Normal Volitional Cough: Strong Volitional Swallow: Able to elicit    Oral/Motor/Sensory Function Overall Oral Motor/Sensory Function: Within functional limits   Ice Chips     Thin Liquid Thin Liquid: Within functional limits    Nectar Thick Nectar Thick Liquid: Not tested   Honey Thick Honey Thick Liquid: Not tested   Puree Puree: Not tested   Solid     Solid: Within functional limits Presentation: Self Fed      Bailey [name], Bailey [name] 06/13/2024,9:56 AM

## 2024-06-13 NOTE — Progress Notes (Signed)
 PT Cancellation Note  Patient Details Name: Bailey [name] MRN: [id number] DOB: 07/01/1956   Cancelled Treatment:    Reason Eval/Treat Not Completed: PT screened, no needs identified, will sign off (Per chart review, pt is a long term resident at [facility] and appears to be at baseline for mobility. PT will sign off at this time.)   Leiloni Smithers 06/13/2024, 10:46 AM

## 2024-06-13 NOTE — Plan of Care (Signed)
  Problem: Skin Integrity: Goal: Risk for impaired skin integrity will decrease Outcome: Progressing   Problem: Metabolic: Goal: Ability to maintain appropriate glucose levels will improve Outcome: Progressing   Problem: Nutritional: Goal: Maintenance of adequate nutrition will improve Outcome: Progressing

## 2024-06-13 NOTE — Plan of Care (Signed)
  Problem: Health Behavior/Discharge Planning: Goal: Ability to manage health-related needs will improve Outcome: Progressing   Problem: Clinical Measurements: Goal: Will remain free from infection Outcome: Progressing Goal: Diagnostic test results will improve Outcome: Progressing   Problem: Coping: Goal: Level of anxiety will decrease Outcome: Progressing   Problem: Elimination: Goal: Will not experience complications related to bowel motility Outcome: Progressing Goal: Will not experience complications related to urinary retention Outcome: Progressing   Problem: Safety: Goal: Ability to remain free from injury will improve Outcome: Progressing   Problem: Skin Integrity: Goal: Risk for impaired skin integrity will decrease Outcome: Progressing

## 2024-06-13 NOTE — Progress Notes (Signed)
 PROGRESS NOTE                                                                                                                                                                                                             Patient Demographics:    Bailey [name], is a 68 y.o. female, DOB - 1956/05/04, FMW:[id number]  Outpatient Primary MD for the patient is Goen, Tracy H, MD    LOS - 1  Admit date - 06/12/2024    Chief Complaint  Patient presents with   Seizures   Head Injury       Brief Narrative (HPI from H&P)   68 y.o. female with medical history significant of CVA, anemia, vitamin D  deficiency, diabetes, hypertension.  Patient was brought in via EMS from [facility] health care with reports of a witnessed focal seizure of 3 to 4 minutes.  During the seizure patient hit her head on the side rail of the wall but did not fall out of the wheelchair.  Patient is on Keppra  that was started about 1 year ago: In the ER she was found to have dehydration, hyponatremia, UTI, CT head showed old stroke but nothing acute.  She was admitted for further care.   Subjective:    Bailey [name] today in bed appears to be in no distress, baseline right-sided weakness and severe expressive aphasia   Assessment  & Plan :   Seizures  - On Keppra  500 twice daily at facility-discussed with neurologist Dr. Salman on 06/13/2024 early morning, this is likely breakthrough seizure, increase the dose of Keppra  to 750 twice daily, head CT nonacute, treat UTI and hyponatremia and monitor.  Neurology nothing else to offer.   Hyponatremia -due to being on diuretic, clinically appears dehydrated, continue gentle hydration and monitor.   UTI on admission.  5 days of Rocephin , follow cultures.   History of CVA with severe right-sided hemiparesis, chronic expressive aphasia - PT OT eval as from long-term facility and cover healthcare   Hypokalemia,  hypomagnesemia.  Replaced.   Hypertension -as needed hydralazine   Diabetes type 2  - Lantus  and subcu insulin  monitor and adjust   Lab Results  Component Value Date   HGBA1C 7.0 (H) 09/04/2023   CBG (last 3)  No results for input(s): GLUCAP in the last 72 hours.  Condition - Extremely Guarded  Family Communication  : Updated patient's son on 06/13/2024 over the phone [phone]   Code Status : Full code  Consults  : Discussed with neurologist Dr. Salman over the phone on 06/13/2024 early in the morning.  PUD Prophylaxis :    Procedures  :     CT head nonacute.  Old left MCA infarct      Disposition Plan  :    Status is: Inpatient   DVT Prophylaxis  :    SCDs Start: 06/12/24 1707    Lab Results  Component Value Date   PLT 335 06/13/2024    Diet :  Diet Order             Diet Carb Modified Fluid consistency: Thin; Room service appropriate? Yes  Diet effective now                    Inpatient Medications  Scheduled Meds:  cholecalciferol   1,000 Units Oral Daily   gabapentin   100 mg Oral QHS   insulin  aspart  0-5 Units Subcutaneous QHS   insulin  aspart  0-9 Units Subcutaneous TID WC   insulin  glargine  20 Units Subcutaneous Daily   levETIRAcetam   750 mg Oral BID   sertraline   75 mg Oral Daily   Continuous Infusions:  sodium chloride      cefTRIAXone  (ROCEPHIN )  IV Stopped (06/12/24 2000)   magnesium  sulfate bolus IVPB     PRN Meds:.acetaminophen  **OR** acetaminophen , hydrALAZINE , ondansetron  **OR** ondansetron  (ZOFRAN ) IV  Antibiotics  :    Anti-infectives (From admission, onward)    Start     Dose/Rate Route Frequency Ordered Stop   06/12/24 1815  cefTRIAXone  (ROCEPHIN ) 1 g in sodium chloride  0.9 % 100 mL IVPB        1 g 200 mL/hr over 30 Minutes Intravenous Every 24 hours 06/12/24 1803 06/17/24 1814         Objective:   Vitals:   06/12/24 2206 06/12/24 2310 06/13/24 0325 06/13/24 0809  BP:  (!) 121/50 (!) 105/55 (!)  103/31  Pulse:  (!) 103 100 96  Resp:  12 16 14   Temp: 97.6 F (36.4 C) 97.9 F (36.6 C) 98 F (36.7 C) 98.8 F (37.1 C)  TempSrc: Oral Oral Oral Oral  SpO2:  98% 98% 96%    Wt Readings from Last 3 Encounters:  09/04/23 79.4 kg  06/16/23 79.4 kg  12/02/22 79.4 kg     Intake/Output Summary (Last 24 hours) at 06/13/2024 0911 Last data filed at 06/13/2024 0545 Gross per 24 hour  Intake 57.97 ml  Output 650 ml  Net -592.03 ml     Physical Exam  Awake, chronic right-sided hemiparesis and expressive aphasia [place].AT,PERRAL Supple Neck, No JVD,   Symmetrical Chest wall movement, Good air movement bilaterally, CTAB RRR,No Gallops,Rubs or new Murmurs,  +ve B.Sounds, Abd Soft, No tenderness,   No Cyanosis, Clubbing or edema       Data Review:    Recent Labs  Lab 06/12/24 1511 06/13/24 0321  WBC 14.3* 13.5*  HGB 12.3 11.5*  HCT 37.9 35.2*  PLT 342 335  MCV 68.8* 67.4*  MCH 22.3* 22.0*  MCHC 32.5 32.7  RDW 16.8* 16.4*  LYMPHSABS 0.9  --   MONOABS 0.7  --   EOSABS 0.1  --   BASOSABS 0.3*  --     Recent Labs  Lab 06/12/24 1511 06/12/24 1630 06/12/24 2148 06/13/24 0010 06/13/24 0321  NA 125* 127*  126* 126* 127*  K 3.0* 2.8* 2.7* 3.3* 3.7  CL 87* 87* 88* 87* 90*  CO2 22 24 22 22 25   ANIONGAP 16* 16* 16* 17* 12  GLUCOSE 177* 171* 208* 188* 164*  BUN 7* <5* 8 8 6*  CREATININE 0.85 0.87 0.86 0.81 0.78  AST 24  --   --   --   --   ALT 18  --   --   --   --   ALKPHOS 115  --   --   --   --   BILITOT 1.0  --   --   --   --   ALBUMIN 3.6  --   --   --   --   MG  --  1.5*  --   --   --   CALCIUM  9.2 9.4 9.2 9.2 9.2      Recent Labs  Lab 06/12/24 1511 06/12/24 1630 06/12/24 2148 06/13/24 0010 06/13/24 0321  MG  --  1.5*  --   --   --   CALCIUM  9.2 9.4 9.2 9.2 9.2    --------------------------------------------------------------------------------------------------------------- Lab Results  Component Value Date   CHOL 131 05/16/2022   HDL 36 (L)  05/16/2022   LDLCALC 25 05/16/2022   TRIG 79 05/17/2022   CHOLHDL 3.6 05/16/2022    Lab Results  Component Value Date   HGBA1C 7.0 (H) 09/04/2023   No results for input(s): TSH, T4TOTAL, FREET4, T3FREE, THYROIDAB in the last 72 hours. No results for input(s): VITAMINB12, FOLATE, FERRITIN, TIBC, IRON , RETICCTPCT in the last 72 hours. ------------------------------------------------------------------------------------------------------------------ Cardiac Enzymes No results for input(s): CKMB, TROPONINI, MYOGLOBIN in the last 168 hours.  Invalid input(s): CK  Micro Results No results found for this or any previous visit (from the past 240 hours).  Radiology Report CT Head Wo Contrast Result Date: 06/12/2024 CLINICAL DATA:  Head trauma. EXAM: CT HEAD WITHOUT CONTRAST TECHNIQUE: Contiguous axial images were obtained from the base of the skull through the vertex without intravenous contrast. RADIATION DOSE REDUCTION: This exam was performed according to the departmental dose-optimization program which includes automated exposure control, adjustment of the mA and/or kV according to patient size and/or use of iterative reconstruction technique. COMPARISON:  05/24/2024 FINDINGS: Brain: Ventricles and cisterns are unremarkable. Mild age related atrophic change. Old large left MCA territory infarct. Mild chronic ischemic microvascular disease. No mass, mass effect, shift of midline structures or acute hemorrhage. No evidence of acute infarction. Vascular: No hyperdense vessel or unexpected calcification. Skull: Normal. Negative for fracture or focal lesion. Sinuses/Orbits: Orbits are normal. Mild opacification over the left maxillary sinus unchanged. Other: None. IMPRESSION: 1. No acute findings. 2. Old large left MCA territory infarct. Mild chronic ischemic microvascular disease. 3. Stable inflammatory change left maxillary sinus. Electronically Signed   By: Toribio Agreste  M.D.   On: 06/12/2024 16:18     Signature  -   Lavada Stank M.D on 06/13/2024 at 9:11 AM   -  To page go to [email or web address]

## 2024-06-13 NOTE — Evaluation (Signed)
 Occupational Therapy Evaluation Patient Details Name: Bailey [name] MRN: [id number] DOB: 08-24-1956 Today's Date: 06/13/2024   History of Present Illness   Bailey [name] is a 68 y.o. female who presented to ER from [facility] health care with reports of a witnessed focal seizure of 3 to 4 minutes.  During the seizure patient hit her head on the side rail of the wall but did not fall out of the wheelchair. Found to have UTI and   hyponatremia. PHMx: CVA, anemia, vitamin D  deficiency, diabetes, hypertension.     Clinical Impressions This 68 yo female admitted with above presents to acute OT with PLOF of needing A for B/D/toileting, but could stand short periods of time holding on with LUE so staff could help with LB ADLs. She was +1 A at facility for bed mobility, standing, and transfers. She currently is close to her baseline level of functioning but could benefit from continued OT here and back at SNF to keep her from losing any functional skills she currently has and maybe even get to a slightly more functional level. Patient will benefit from continued inpatient follow up therapy, <3 hours/day.      If plan is discharge home, recommend the following:   A lot of help with walking and/or transfers;A lot of help with bathing/dressing/bathroom;Assist for transportation     Functional Status Assessment   Patient has had a recent decline in their functional status and demonstrates the ability to make significant improvements in function in a reasonable and predictable amount of time.     Equipment Recommendations   None recommended by OT      Precautions/Restrictions   Precautions Precautions: Fall Precaution/Restrictions Comments: Decreased use of right side due to prior CVA, expressive difficulties Restrictions Weight Bearing Restrictions Per Provider Order: No     Mobility Bed Mobility Overal bed mobility: Needs Assistance Bed Mobility: Supine to Sit      Supine to sit: Max assist          Transfers Overall transfer level: Needs assistance Equipment used: 1 person hand held assist Transfers: Sit to/from Stand Sit to Stand: Mod assist, Min assist           General transfer comment: Mod A on initial stand from bed; min A from recliner. Pt needs verbal and tactile cues for LUE hand placement for transfer initiation      Balance Overall balance assessment: Needs assistance Sitting-balance support: Single extremity supported, Feet supported Sitting balance-Leahy Scale: Fair     Standing balance support: Single extremity supported Standing balance-Leahy Scale: Poor                             ADL either performed or assessed with clinical judgement   ADL Overall ADL's : Needs assistance/impaired     Grooming: Wash/dry face;Sitting Grooming Details (indicate cue type and reason): in recliner, set up/S   Upper Body Bathing Details (indicate cue type and reason): staff normally do this for her at SNF   Lower Body Bathing Details (indicate cue type and reason): staff normally do this for her at SNF   Upper Body Dressing Details (indicate cue type and reason): staff normally do this for her at SNF   Lower Body Dressing Details (indicate cue type and reason): staff normally do this for her at SNF Toilet Transfer: Moderate assistance;Stand-pivot Toilet Transfer Details (indicate cue type and reason): simulated bed to recliner next to bed  Toileting - Clothing Manipulation Details (indicate cue type and reason): Pt could maintain standing balance holding onto my arm while I assisted with adjusting pad/clothing under/behind her             Vision Baseline Vision/History: 1 Wears glasses (per picture)              Pertinent Vitals/Pain Pain Assessment Pain Assessment: No/denies pain     Extremity/Trunk Assessment Upper Extremity Assessment Upper Extremity Assessment: Right hand dominant;RUE  deficits/detail RUE Deficits / Details: Decreased AROM, contractures, non functional; now left handed RUE Coordination: decreased fine motor;decreased gross motor           Communication Communication Communication: Impaired Factors Affecting Communication: Difficulty expressing self   Cognition Arousal: Alert Behavior During Therapy: WFL for tasks assessed/performed Cognition: History of cognitive impairments             OT - Cognition Comments: due to prior CVA                 Following commands: Impaired Following commands impaired: Follows one step commands inconsistently, Follows multi-step commands inconsistently     Cueing    Cueing Techniques: Verbal cues;Gestural cues;Tactile cues;Visual cues              Home Living Family/patient expects to be discharged to:: Skilled nursing facility                                 Additional Comments: pt from [facility]      Prior Functioning/Environment Prior Level of Function : Needs assist             Mobility Comments: In speaking with hall Interior and spatial designer at [facility] it was reported that pt is a +1 A for bed mobility, sit>stand, and stand pivot transfers; as well as once she is in standing and has her balance with LUE holding onto something steady she can maintain standing for short periods of time for staff to A her with LB ADLs. She also tries to get herself around in her wheelchair ADLs Comments: Per staff at [facility] she can feed herself once food set up for her and wash her face and brush her teeth once items set up from her.    OT Problem List: Decreased strength;Decreased range of motion;Impaired balance (sitting and/or standing);Impaired UE functional use;Impaired tone;Decreased cognition   OT Treatment/Interventions: Self-care/ADL training;DME and/or AE instruction;Balance training;Patient/family education;Therapeutic activities      OT Goals(Current goals can be found in the care  plan section)   Acute Rehab OT Goals Patient Stated Goal: pt unable to state OT Goal Formulation: Patient unable to participate in goal setting Time For Goal Achievement: 06/27/24 Potential to Achieve Goals: Good   OT Frequency:  Min 1X/week       AM-PAC OT 6 Clicks Daily Activity     Outcome Measure Help from another person eating meals?: A Little Help from another person taking care of personal grooming?: A Little Help from another person toileting, which includes using toliet, bedpan, or urinal?: A Lot Help from another person bathing (including washing, rinsing, drying)?: A Lot Help from another person to put on and taking off regular upper body clothing?: Total Help from another person to put on and taking off regular lower body clothing?: Total 6 Click Score: 12   End of Session Equipment Utilized During Treatment: Gait belt Nurse Communication: Mobility status;Need for lift equipment (via  secure chat to RN and NT)  Activity Tolerance: Patient tolerated treatment well Patient left: in chair;with call bell/phone within reach;with chair alarm set  OT Visit Diagnosis: Unsteadiness on feet (R26.81);Other abnormalities of gait and mobility (R26.89);Muscle weakness (generalized) (M62.81);Other symptoms and signs involving cognitive function;Hemiplegia and hemiparesis Hemiplegia - Right/Left: Right Hemiplegia - dominant/non-dominant: Dominant Hemiplegia - caused by: Cerebral infarction                Time: 8651-8587 OT Time Calculation (min): 24 min Charges:  OT General Charges $OT Visit: 1 Visit OT Evaluation $OT Eval Moderate Complexity: 1 Mod OT Treatments $Self Care/Home Management : 8-22 mins  Donny BECKER OT Acute Rehabilitation Services Office [phone]    [place] Dorothyann Distel 06/13/2024, 5:24 PM

## 2024-06-14 DIAGNOSIS — R569 Unspecified convulsions: Secondary | ICD-10-CM | POA: Diagnosis not present

## 2024-06-14 LAB — MAGNESIUM: Magnesium: 1.9 mg/dL (ref 1.7–2.4)

## 2024-06-14 LAB — CBC WITH DIFFERENTIAL/PLATELET
Abs Immature Granulocytes: 0.02 K/uL (ref 0.00–0.07)
Basophils Absolute: 0 K/uL (ref 0.0–0.1)
Basophils Relative: 1 %
Eosinophils Absolute: 0.4 K/uL (ref 0.0–0.5)
Eosinophils Relative: 4 %
HCT: 34.9 % — ABNORMAL LOW (ref 36.0–46.0)
Hemoglobin: 11.5 g/dL — ABNORMAL LOW (ref 12.0–15.0)
Immature Granulocytes: 0 %
Lymphocytes Relative: 23 %
Lymphs Abs: 1.9 K/uL (ref 0.7–4.0)
MCH: 22.5 pg — ABNORMAL LOW (ref 26.0–34.0)
MCHC: 33 g/dL (ref 30.0–36.0)
MCV: 68.3 fL — ABNORMAL LOW (ref 80.0–100.0)
Monocytes Absolute: 1.2 K/uL — ABNORMAL HIGH (ref 0.1–1.0)
Monocytes Relative: 15 %
Neutro Abs: 4.6 K/uL (ref 1.7–7.7)
Neutrophils Relative %: 57 %
Platelets: 315 K/uL (ref 150–400)
RBC: 5.11 MIL/uL (ref 3.87–5.11)
RDW: 17 % — ABNORMAL HIGH (ref 11.5–15.5)
WBC: 8 K/uL (ref 4.0–10.5)
nRBC: 0 % (ref 0.0–0.2)

## 2024-06-14 LAB — BASIC METABOLIC PANEL WITH GFR
Anion gap: 9 (ref 5–15)
BUN: <5 mg/dL — ABNORMAL LOW (ref 8–23)
CO2: 25 mmol/L (ref 22–32)
Calcium: 8.9 mg/dL (ref 8.9–10.3)
Chloride: 97 mmol/L — ABNORMAL LOW (ref 98–111)
Creatinine, Ser: 0.66 mg/dL (ref 0.44–1.00)
GFR, Estimated: >60 mL/min (ref >60)
Glucose, Bld: 136 mg/dL — ABNORMAL HIGH (ref 70–99)
Potassium: 3.5 mmol/L (ref 3.5–5.1)
Sodium: 131 mmol/L — ABNORMAL LOW (ref 135–145)

## 2024-06-14 LAB — GLUCOSE, CAPILLARY
Glucose-Capillary: 139 mg/dL — ABNORMAL HIGH (ref 70–99)
Glucose-Capillary: 144 mg/dL — ABNORMAL HIGH (ref 70–99)
Glucose-Capillary: 129 mg/dL — ABNORMAL HIGH (ref 70–99)
Glucose-Capillary: 134 mg/dL — ABNORMAL HIGH (ref 70–99)

## 2024-06-14 LAB — HEMOGLOBIN A1C
Hgb A1c MFr Bld: 7.3 % — ABNORMAL HIGH (ref 4.8–5.6)
Mean Plasma Glucose: 163 mg/dL

## 2024-06-14 LAB — UREA NITROGEN, URINE: Urea Nitrogen, Ur: 206 mg/dL

## 2024-06-14 MED ORDER — MAGNESIUM SULFATE 2 GM/50ML IV SOLN
2.0000 g | Freq: Once | INTRAVENOUS | Status: AC
  Administered 2024-06-14: 2 g via INTRAVENOUS
  Filled 2024-06-14: qty 50

## 2024-06-14 MED ORDER — POTASSIUM CHLORIDE CRYS ER 20 MEQ PO TBCR
40.0000 meq | EXTENDED_RELEASE_TABLET | Freq: Once | ORAL | Status: AC
Start: 2024-06-14 — End: 2024-06-14
  Administered 2024-06-14: 40 meq via ORAL
  Filled 2024-06-14: qty 2

## 2024-06-14 MED ORDER — SODIUM CHLORIDE 0.9 % IV SOLN: INTRAVENOUS | Status: AC

## 2024-06-14 NOTE — TOC Initial Note (Signed)
 Transition of Care [facility]) - Initial/Assessment Note    Patient Details  Name: Bailey [name] MRN: [id number] Date of Birth: 24-May-1956  [facility] of [facility]) CM/SW Contact:    Bailey GORMAN Kindle, LCSW Phone Number: 06/14/2024, 3:56 PM  Clinical Narrative:                 Patient admitted from [facility]. CSW provided update to facility and will follow for return.   Expected Discharge Plan: Skilled Nursing Facility Barriers to Discharge: Continued Medical Work up   Patient Goals and CMS Choice Patient states their goals for this hospitalization and ongoing recovery are:: Return to snf          Expected Discharge Plan and Services In-house Referral: Clinical Social Work   Post Acute Care Choice: Skilled Nursing Facility Living arrangements for the past 2 months: Skilled Nursing Facility                                      Prior Living Arrangements/Services Living arrangements for the past 2 months: Skilled Nursing Facility Lives with:: Facility Resident Patient language and need for interpreter reviewed:: Yes Do you feel safe going back to the place where you live?: Yes      Need for Family Participation in Patient Care: Yes (Comment) Care giver support system in place?: Yes (comment)   Criminal Activity/Legal Involvement Pertinent to Current Situation/Hospitalization: No - Comment as needed  Activities of Daily Living      Permission Sought/Granted Permission sought to share information with : Facility Medical sales representative, Family Supports Permission granted to share information with : No  Share Information with NAME: Bailey [name], Threats [phone]  Permission granted to share info w AGENCY: [facility]        Emotional Assessment Appearance:: Appears stated age Attitude/Demeanor/Rapport: Unable to Assess Affect (typically observed): Unable to Assess Orientation: :  (follows commands) Alcohol / Substance Use: Not Applicable Psych  Involvement: No (comment)  Admission diagnosis:  Seizures (HCC) [R56.9] Patient Active Problem List   Diagnosis Date Noted   Seizures (HCC) 06/12/2024   Anemia 09/04/2023   Seizure-like activity (HCC) 09/04/2023   SIRS (systemic inflammatory response syndrome) (HCC) 09/04/2023   History of CVA (cerebrovascular accident) 09/04/2023   Middle cerebral artery embolism, 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   Acute cerebrovascular accident (CVA) due to occlusion of left carotid artery (HCC) 05/15/2022   Osteoporosis 10/16/2021   Type 2 diabetes mellitus with diabetic neuropathy, unspecified (HCC) 07/02/2021   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   Diabetic retinopathy (HCC) 02/20/2017   Fatty liver 11/24/2016   GERD (gastroesophageal reflux disease) 07/26/2015   Hyperlipidemia 07/26/2015   Dyspepsia 07/26/2015   Essential hypertension 07/26/2015   Fibrocystic breast disease 03/22/2015   Type 2 diabetes mellitus treated without insulin  (HCC) 02/16/2013   PCP:  Richrd Randine DEL, MD Pharmacy:   [facility] - [place], [place] - [address] [place] [address] Phone: [phone] Fax: [phone]  [facility] [phone] [place], [place] - [address] AT [facility] [address] & [facility] [address] [place] [address] Phone: [phone] Fax: [phone]  [facility] [address], [place] - [address] [place] [address] Phone: [phone] Fax: [phone]     Social Drivers of Health (SDOH) Social History: SDOH Screenings   Food Insecurity: Unknown (06/12/2024)  Housing:  Unknown (06/12/2024)  Transportation Needs: Unknown (06/12/2024)  Utilities: Patient Unable To Answer (06/12/2024)  Alcohol Screen: Low Risk  (10/23/2021)  Depression (PHQ2-9): Low Risk  (10/23/2021)  Financial Resource Strain: Low Risk  (10/23/2021)  Physical Activity: Inactive (10/23/2021)  Social Connections: Patient Unable To  Answer (06/13/2024)  Stress: Stress Concern Present (10/23/2021)  Tobacco Use: High Risk (09/04/2023)   SDOH Interventions:     Readmission Risk Interventions     No data to display

## 2024-06-14 NOTE — Plan of Care (Signed)
  Problem: Clinical Measurements: Goal: Ability to maintain clinical measurements within normal limits will improve Outcome: Progressing Goal: Diagnostic test results will improve Outcome: Progressing   Problem: Activity: Goal: Risk for activity intolerance will decrease Outcome: Progressing   Problem: Elimination: Goal: Will not experience complications related to bowel motility Outcome: Progressing   Problem: Safety: Goal: Ability to remain free from injury will improve Outcome: Progressing   Problem: Coping: Goal: Ability to adjust to condition or change in health will improve Outcome: Progressing

## 2024-06-14 NOTE — Progress Notes (Signed)
 Physical Therapy Treatment Patient Details Name: Bailey [name] MRN: [id number] DOB: [birth year] Today's Date: 06/14/2024   History of Present Illness Bailey [name] is a 68 y.o. female who presented to ER from [facility] health care with reports of a witnessed focal seizure of 3 to 4 minutes.  During the seizure patient hit her head on the side rail of the wall but did not fall out of the wheelchair. Found to have UTI and   hyponatremia. PHMx: CVA, anemia, vitamin D  deficiency, diabetes, hypertension.    PT Comments  Pt presents with admitting diagnosis above. Pt today was able to transfer into chair with +2 Mod A HHA. Per OT note, pt was a 1 assist at her SNF for bed mobility and transfers to [facility]. Patient will benefit from continued inpatient follow up therapy, <3 hours/day. PT will continue to follow.    If plan is discharge home, recommend the following: A lot of help with walking and/or transfers;A lot of help with bathing/dressing/bathroom;Assistance with cooking/housework;Direct supervision/assist for medications management;Assist for transportation;Help with stairs or ramp for entrance;Supervision due to cognitive status   Can travel by private vehicle     No  Equipment Recommendations  None recommended by PT    Recommendations for Other Services       Precautions / Restrictions Precautions Precautions: Fall Precaution/Restrictions Comments: Decreased use of right side due to prior CVA, expressive difficulties Restrictions Weight Bearing Restrictions Per Provider Order: No     Mobility  Bed Mobility Overal bed mobility: Needs Assistance Bed Mobility: Supine to Sit     Supine to sit: Max assist     General bed mobility comments: Max A to sit EOB. Assistance with BLE and trunk elevation.    Transfers Overall transfer level: Needs assistance Equipment used: 1 person hand held assist Transfers: Sit to/from Stand Sit to Stand: Mod assist, Min assist            General transfer comment: Mod A on initial stand from bed; min A from recliner. Pt needs verbal and tactile cues for LUE hand placement for transfer initiation    Ambulation/Gait               General Gait Details: Unable at baseline   Stairs             Wheelchair Mobility     Tilt Bed    Modified Rankin (Stroke Patients Only)       Balance Overall balance assessment: Needs assistance Sitting-balance support: Single extremity supported, Feet supported Sitting balance-Leahy Scale: Fair     Standing balance support: Single extremity supported Standing balance-Leahy Scale: Poor                              Communication Communication Communication: Impaired Factors Affecting Communication: Difficulty expressing self  Cognition Arousal: Alert Behavior During Therapy: WFL for tasks assessed/performed                             Following commands: Impaired Following commands impaired: Follows one step commands inconsistently, Follows multi-step commands inconsistently    Cueing Cueing Techniques: Verbal cues, Gestural cues, Tactile cues, Visual cues  Exercises      General Comments General comments (skin integrity, edema, etc.): VSS      Pertinent Vitals/Pain Pain Assessment Pain Assessment: Faces Faces Pain Scale: Hurts a little bit Pain Location: Generalized Pain Descriptors /  Indicators: Grimacing Pain Intervention(s): Monitored during session    Home Living Family/patient expects to be discharged to:: Skilled nursing facility                   Additional Comments: pt from [employer]    Prior Function            PT Goals (current goals can now be found in the care plan section) Acute Rehab PT Goals PT Goal Formulation: Patient unable to participate in goal setting (Expressive aphasia) Time For Goal Achievement: 06/29/24 Potential to Achieve Goals: Fair    Frequency    Min 1X/week       PT Plan      Co-evaluation              AM-PAC PT 6 Clicks Mobility   Outcome Measure  Help needed turning from your back to your side while in a flat bed without using bedrails?: A Lot Help needed moving from lying on your back to sitting on the side of a flat bed without using bedrails?: A Lot Help needed moving to and from a bed to a chair (including a wheelchair)?: A Lot Help needed standing up from a chair using your arms (e.g., wheelchair or bedside chair)?: A Lot Help needed to walk in hospital room?: Total Help needed climbing 3-5 steps with a railing? : Total 6 Click Score: 10    End of Session Equipment Utilized During Treatment: Gait belt Activity Tolerance: Patient tolerated treatment well Patient left: in chair;with call bell/phone within reach;with chair alarm set Nurse Communication: Mobility status PT Visit Diagnosis: Other abnormalities of gait and mobility (R26.89)     Time: 8942-8888 PT Time Calculation (min) (ACUTE ONLY): 14 min  Charges:      PT General Charges $$ ACUTE PT VISIT: 1 Visit                     Sueellen NOVAK, PT, DPT Acute Rehab Services [phone]    Bailey [name] 06/14/2024, 1:42 PM

## 2024-06-14 NOTE — Plan of Care (Signed)
   Problem: Clinical Measurements: Goal: Will remain free from infection Outcome: Progressing Goal: Diagnostic test results will improve Outcome: Progressing

## 2024-06-14 NOTE — NC FL2 (Signed)
 [place]  MEDICAID FL2 LEVEL OF CARE FORM     IDENTIFICATION  Patient Name: Bailey [name] Birthdate: June 25, 1956 Sex: female Admission Date (Current Location): 06/12/2024  [facility] and [place] Number:  Producer, television/film/video and Address:  [facility], [address] [place], [place] [address]      Provider Number: [id number]  Attending Physician Name and Address:  Dennise Lavada POUR, MD  Relative Name and Phone Number:       Current Level of Care: Hospital Recommended Level of Care: Skilled Nursing Facility Prior Approval Number:    Date Approved/Denied:   PASRR Number: [id number]  Discharge Plan: SNF    Current Diagnoses: Patient Active Problem List   Diagnosis Date Noted   Seizures (HCC) 06/12/2024   Anemia 09/04/2023   Seizure-like activity (HCC) 09/04/2023   SIRS (systemic inflammatory response syndrome) (HCC) 09/04/2023   History of CVA (cerebrovascular accident) 09/04/2023   Middle cerebral artery embolism, 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   Acute cerebrovascular accident (CVA) due to occlusion of left carotid artery (HCC) 05/15/2022   Osteoporosis 10/16/2021   Type 2 diabetes mellitus with diabetic neuropathy, unspecified (HCC) 07/02/2021   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   Diabetic retinopathy (HCC) 02/20/2017   Fatty liver 11/24/2016   GERD (gastroesophageal reflux disease) 07/26/2015   Hyperlipidemia 07/26/2015   Dyspepsia 07/26/2015   Essential hypertension 07/26/2015   Fibrocystic breast disease 03/22/2015   Type 2 diabetes mellitus treated without insulin  (HCC) 02/16/2013    Orientation RESPIRATION BLADDER Height & Weight      (nods appropriately, expressive aphasia)  Normal Incontinent Weight:   Height:     BEHAVIORAL SYMPTOMS/MOOD NEUROLOGICAL BOWEL NUTRITION STATUS      Incontinent Diet (See dc summary)  AMBULATORY STATUS COMMUNICATION OF NEEDS Skin   Extensive Assist Verbally Normal                       Personal  Care Assistance Level of Assistance  Bathing, Feeding, Dressing Bathing Assistance: Maximum assistance Feeding assistance: Limited assistance Dressing Assistance: Limited assistance     Functional Limitations Info             SPECIAL CARE FACTORS FREQUENCY  PT (By licensed PT), OT (By licensed OT)     PT Frequency: Eval and treat OT Frequency: Eval and treat            Contractures Contractures Info: Not present    Additional Factors Info  Code Status, Allergies Code Status Info: Full Allergies Info: Beef-derived Drug Products, Pork-derived Products           Current Medications (06/14/2024):  This is the current hospital active medication list Current Facility-Administered Medications  Medication Dose Route Frequency Provider Last Rate Last Admin   0.9 %  sodium chloride  infusion   Intravenous Continuous Singh, Prashant K, MD 50 mL/hr at 06/14/24 0756 New Bag at 06/14/24 0756   acetaminophen  (TYLENOL ) tablet 650 mg  650 mg Oral Q6H PRN Vann, Jessica U, DO       Or   acetaminophen  (TYLENOL ) suppository 650 mg  650 mg Rectal Q6H PRN Vann, Jessica U, DO       cefTRIAXone  (ROCEPHIN ) 1 g in sodium chloride  0.9 % 100 mL IVPB  1 g Intravenous Q24H Singh, Prashant K, MD 200 mL/hr at 06/13/24 1813 Infusion Verify at 06/13/24 1813   cholecalciferol  (VITAMIN D3) 25 MCG (1000 UNIT) tablet 1,000 Units  1,000 Units Oral Daily Singh, Prashant K, MD  1,000 Units at 06/14/24 0809   gabapentin  (NEURONTIN ) capsule 100 mg  100 mg Oral QHS Vann, Jessica U, DO   100 mg at 06/13/24 2219   hydrALAZINE  (APRESOLINE ) injection 10 mg  10 mg Intravenous Q6H PRN Singh, Prashant K, MD       insulin  aspart (novoLOG ) injection 0-5 Units  0-5 Units Subcutaneous QHS Singh, Prashant K, MD       insulin  aspart (novoLOG ) injection 0-9 Units  0-9 Units Subcutaneous TID WC Singh, Prashant K, MD   1 Units at 06/14/24 1224   insulin  glargine (LANTUS ) injection 20 Units  20 Units Subcutaneous Daily Singh,  Prashant K, MD   20 Units at 06/14/24 0932   levETIRAcetam  (KEPPRA ) tablet 750 mg  750 mg Oral BID Vann, Jessica U, DO   750 mg at 06/14/24 9190   ondansetron  (ZOFRAN ) tablet 4 mg  4 mg Oral Q6H PRN Vann, Jessica U, DO       Or   ondansetron  (ZOFRAN ) injection 4 mg  4 mg Intravenous Q6H PRN Vann, Jessica U, DO   4 mg at 06/12/24 2123   sertraline  (ZOLOFT ) tablet 75 mg  75 mg Oral Daily Singh, Prashant K, MD   75 mg at 06/14/24 0809     Discharge Medications: Please see discharge summary for a list of discharge medications.  Relevant Imaging Results:  Relevant Lab Results:   Additional Information SSN: [id number]  Deny Chevez S Khali Perella, LCSW

## 2024-06-14 NOTE — Progress Notes (Signed)
 PROGRESS NOTE                                                                                                                                                                                                             Patient Demographics:    Bailey [name], is a 68 y.o. female, DOB - [birth year], FMW:[id number]  Outpatient Primary MD for the patient is Goen, Tracy H, MD    LOS - 2  Admit date - 06/12/2024    Chief Complaint  Patient presents with   Seizures   Head Injury       Brief Narrative (HPI from H&P)   68 y.o. female with medical history significant of CVA, anemia, vitamin D  deficiency, diabetes, hypertension.  Patient was brought in via EMS from [facility] health care with reports of a witnessed focal seizure of 3 to 4 minutes.  During the seizure patient hit her head on the side rail of the wall but did not fall out of the wheelchair.  Patient is on Keppra  that was started about 1 year ago: In the ER she was found to have dehydration, hyponatremia, UTI, CT head showed old stroke but nothing acute.  She was admitted for further care.   Subjective:   Patient in bed appears to be in no distress overall looks much better than the day of admission, due to previous stroke and expressive aphasia unable to answer questions.   Assessment  & Plan :   Seizures  - On Keppra  500 twice daily at facility-discussed with neurologist Dr. Salman on 06/13/2024 early morning, this is likely breakthrough seizure, increase the dose of Keppra  to 750 twice daily, head CT nonacute, treat UTI and hyponatremia and monitor.  Neurology nothing else to offer.  Patient close to baseline at this point.   Hyponatremia caused by dehydration-due to being on diuretic, clinically appears dehydrated, improving with IV fluids, continue hydration for another 15 hours.   UTI on admission.  5 days of Rocephin , follow cultures.   History of CVA with severe  right-sided hemiparesis, chronic expressive aphasia - PT OT eval as from long-term facility and cover healthcare   Hypokalemia, hypomagnesemia.  Replaced.   Hypertension -as needed hydralazine   Diabetes type 2  - Lantus  and subcu insulin  monitor and adjust   Lab Results  Component Value Date   HGBA1C  7.3 (H) 06/13/2024   CBG (last 3)  Recent Labs    06/13/24 1614 06/13/24 2141 06/14/24 0754  GLUCAP 155* 152* 139*         Condition - Extremely Guarded  Family Communication  : Updated patient's son on 06/13/2024 over the phone [phone]   Code Status : Full code  Consults  : Discussed with neurologist Dr. Salman over the phone on 06/13/2024 early in the morning.  PUD Prophylaxis :    Procedures  :     CT head nonacute.  Old left MCA infarct      Disposition Plan  :    Status is: Inpatient   DVT Prophylaxis  :    SCDs Start: 06/12/24 1707    Lab Results  Component Value Date   PLT 315 06/14/2024    Diet :  Diet Order             Diet Carb Modified Fluid consistency: Thin; Room service appropriate? Yes  Diet effective now                    Inpatient Medications  Scheduled Meds:  cholecalciferol   1,000 Units Oral Daily   gabapentin   100 mg Oral QHS   insulin  aspart  0-5 Units Subcutaneous QHS   insulin  aspart  0-9 Units Subcutaneous TID WC   insulin  glargine  20 Units Subcutaneous Daily   levETIRAcetam   750 mg Oral BID   sertraline   75 mg Oral Daily   Continuous Infusions:  sodium chloride  50 mL/hr at 06/14/24 0756   cefTRIAXone  (ROCEPHIN )  IV 200 mL/hr at 06/13/24 1813   PRN Meds:.acetaminophen  **OR** acetaminophen , hydrALAZINE , ondansetron  **OR** ondansetron  (ZOFRAN ) IV  Antibiotics  :    Anti-infectives (From admission, onward)    Start     Dose/Rate Route Frequency Ordered Stop   06/12/24 1815  cefTRIAXone  (ROCEPHIN ) 1 g in sodium chloride  0.9 % 100 mL IVPB        1 g 200 mL/hr over 30 Minutes Intravenous Every 24 hours  06/12/24 1803 06/17/24 1814         Objective:   Vitals:   06/14/24 0000 06/14/24 0200 06/14/24 0400 06/14/24 0750  BP: (!) 119/58  126/61 (!) 135/55  Pulse: 83 98 82 88  Resp: 13 19 11 12   Temp: 98.8 F (37.1 C)  98.2 F (36.8 C) 97.7 F (36.5 C)  TempSrc: Oral  Axillary Oral  SpO2: 99% 100% 100% 100%    Wt Readings from Last 3 Encounters:  09/04/23 79.4 kg  06/16/23 79.4 kg  12/02/22 79.4 kg     Intake/Output Summary (Last 24 hours) at 06/14/2024 0824 Last data filed at 06/13/2024 1813 Gross per 24 hour  Intake 653.14 ml  Output 800 ml  Net -146.86 ml     Physical Exam  Awake, chronic right-sided hemiparesis and expressive aphasia [place].AT,PERRAL Supple Neck, No JVD,   Symmetrical Chest wall movement, Good air movement bilaterally, CTAB RRR,No Gallops,Rubs or new Murmurs,  +ve B.Sounds, Abd Soft, No tenderness,   No Cyanosis, Clubbing or edema       Data Review:    Recent Labs  Lab 06/12/24 1511 06/13/24 0321 06/14/24 0346  WBC 14.3* 13.5* 8.0  HGB 12.3 11.5* 11.5*  HCT 37.9 35.2* 34.9*  PLT 342 335 315  MCV 68.8* 67.4* 68.3*  MCH 22.3* 22.0* 22.5*  MCHC 32.5 32.7 33.0  RDW 16.8* 16.4* 17.0*  LYMPHSABS 0.9  --  1.9  MONOABS 0.7  --  1.2*  EOSABS 0.1  --  0.4  BASOSABS 0.3*  --  0.0    Recent Labs  Lab 06/12/24 1511 06/12/24 1630 06/12/24 2148 06/13/24 0010 06/13/24 0321 06/13/24 0941 06/14/24 0346  NA 125* 127* 126* 126* 127*  --  131*  K 3.0* 2.8* 2.7* 3.3* 3.7  --  3.5  CL 87* 87* 88* 87* 90*  --  97*  CO2 22 24 22 22 25   --  25  ANIONGAP 16* 16* 16* 17* 12  --  9  GLUCOSE 177* 171* 208* 188* 164*  --  136*  BUN 7* <5* 8 8 6*  --  <5*  CREATININE 0.85 0.87 0.86 0.81 0.78  --  0.66  AST 24  --   --   --   --   --   --   ALT 18  --   --   --   --   --   --   ALKPHOS 115  --   --   --   --   --   --   BILITOT 1.0  --   --   --   --   --   --   ALBUMIN 3.6  --   --   --   --   --   --   HGBA1C  --   --   --   --   --  7.3*  --    MG  --  1.5*  --   --   --   --  1.9  CALCIUM  9.2 9.4 9.2 9.2 9.2  --  8.9      Recent Labs  Lab 06/12/24 1630 06/12/24 2148 06/13/24 0010 06/13/24 0321 06/13/24 0941 06/14/24 0346  HGBA1C  --   --   --   --  7.3*  --   MG 1.5*  --   --   --   --  1.9  CALCIUM  9.4 9.2 9.2 9.2  --  8.9    --------------------------------------------------------------------------------------------------------------- Lab Results  Component Value Date   CHOL 131 05/16/2022   HDL 36 (L) 05/16/2022   LDLCALC 25 05/16/2022   TRIG 79 05/17/2022   CHOLHDL 3.6 05/16/2022    Lab Results  Component Value Date   HGBA1C 7.3 (H) 06/13/2024   No results for input(s): TSH, T4TOTAL, FREET4, T3FREE, THYROIDAB in the last 72 hours. No results for input(s): VITAMINB12, FOLATE, FERRITIN, TIBC, IRON , RETICCTPCT in the last 72 hours. ------------------------------------------------------------------------------------------------------------------ Cardiac Enzymes No results for input(s): CKMB, TROPONINI, MYOGLOBIN in the last 168 hours.  Invalid input(s): CK  Micro Results No results found for this or any previous visit (from the past 240 hours).  Radiology Report CT Head Wo Contrast Result Date: 06/12/2024 CLINICAL DATA:  Head trauma. EXAM: CT HEAD WITHOUT CONTRAST TECHNIQUE: Contiguous axial images were obtained from the base of the skull through the vertex without intravenous contrast. RADIATION DOSE REDUCTION: This exam was performed according to the departmental dose-optimization program which includes automated exposure control, adjustment of the mA and/or kV according to patient size and/or use of iterative reconstruction technique. COMPARISON:  05/24/2024 FINDINGS: Brain: Ventricles and cisterns are unremarkable. Mild age related atrophic change. Old large left MCA territory infarct. Mild chronic ischemic microvascular disease. No mass, mass effect, shift of midline  structures or acute hemorrhage. No evidence of acute infarction. Vascular: No hyperdense vessel or unexpected calcification. Skull: Normal. Negative for fracture or focal lesion. Sinuses/Orbits: Orbits are normal. Mild opacification  over the left maxillary sinus unchanged. Other: None. IMPRESSION: 1. No acute findings. 2. Old large left MCA territory infarct. Mild chronic ischemic microvascular disease. 3. Stable inflammatory change left maxillary sinus. Electronically Signed   By: Toribio Agreste M.D.   On: 06/12/2024 16:18     Signature  -   Lavada Stank M.D on 06/14/2024 at 8:24 AM   -  To page go to [email or web address]

## 2024-06-15 DIAGNOSIS — R569 Unspecified convulsions: Secondary | ICD-10-CM | POA: Diagnosis not present

## 2024-06-15 LAB — CBC WITH DIFFERENTIAL/PLATELET
Abs Immature Granulocytes: 0.03 K/uL (ref 0.00–0.07)
Basophils Absolute: 0 K/uL (ref 0.0–0.1)
Basophils Relative: 0 %
Eosinophils Absolute: 0.4 K/uL (ref 0.0–0.5)
Eosinophils Relative: 5 %
HCT: 33.1 % — ABNORMAL LOW (ref 36.0–46.0)
Hemoglobin: 10.8 g/dL — ABNORMAL LOW (ref 12.0–15.0)
Immature Granulocytes: 0 %
Lymphocytes Relative: 29 %
Lymphs Abs: 2.2 K/uL (ref 0.7–4.0)
MCH: 22.4 pg — ABNORMAL LOW (ref 26.0–34.0)
MCHC: 32.6 g/dL (ref 30.0–36.0)
MCV: 68.7 fL — ABNORMAL LOW (ref 80.0–100.0)
Monocytes Absolute: 0.9 K/uL (ref 0.1–1.0)
Monocytes Relative: 12 %
Neutro Abs: 4 K/uL (ref 1.7–7.7)
Neutrophils Relative %: 54 %
Platelets: 304 K/uL (ref 150–400)
RBC: 4.82 MIL/uL (ref 3.87–5.11)
RDW: 17.2 % — ABNORMAL HIGH (ref 11.5–15.5)
WBC: 7.5 K/uL (ref 4.0–10.5)
nRBC: 0 % (ref 0.0–0.2)

## 2024-06-15 LAB — BASIC METABOLIC PANEL WITH GFR
Anion gap: 10 (ref 5–15)
BUN: <5 mg/dL — ABNORMAL LOW (ref 8–23)
CO2: 24 mmol/L (ref 22–32)
Calcium: 8.9 mg/dL (ref 8.9–10.3)
Chloride: 98 mmol/L (ref 98–111)
Creatinine, Ser: 0.59 mg/dL (ref 0.44–1.00)
GFR, Estimated: >60 mL/min (ref >60)
Glucose, Bld: 126 mg/dL — ABNORMAL HIGH (ref 70–99)
Potassium: 3.3 mmol/L — ABNORMAL LOW (ref 3.5–5.1)
Sodium: 132 mmol/L — ABNORMAL LOW (ref 135–145)

## 2024-06-15 LAB — GLUCOSE, CAPILLARY: Glucose-Capillary: 131 mg/dL — ABNORMAL HIGH (ref 70–99)

## 2024-06-15 LAB — MAGNESIUM: Magnesium: 1.9 mg/dL (ref 1.7–2.4)

## 2024-06-15 MED ORDER — LEVETIRACETAM 750 MG PO TABS: 750.0000 mg | ORAL_TABLET | Freq: Two times a day (BID) | ORAL | Status: DC

## 2024-06-15 MED ORDER — CEPHALEXIN 500 MG PO CAPS: 500.0000 mg | ORAL_CAPSULE | Freq: Three times a day (TID) | ORAL | Status: DC

## 2024-06-15 MED ORDER — POTASSIUM CHLORIDE CRYS ER 20 MEQ PO TBCR
40.0000 meq | EXTENDED_RELEASE_TABLET | Freq: Once | ORAL | Status: AC
Start: 2024-06-15 — End: 2024-06-15
  Administered 2024-06-15: 40 meq via ORAL
  Filled 2024-06-15: qty 2

## 2024-06-15 MED ORDER — LANTUS 100 UNIT/ML ~~LOC~~ SOLN: 20.0000 [IU] | Freq: Every day | SUBCUTANEOUS | Status: DC

## 2024-06-15 NOTE — Discharge Summary (Signed)
 Bailey [name] FMW:[id number] DOB: 06/03/1956 DOA: 06/12/2024  PCP: Bailey Randine DEL, MD  Admit date: 06/12/2024  Discharge date: 06/15/2024  Admitted From: SNF   Disposition:  SNF   Recommendations for Outpatient Follow-up:   Follow up with PCP in 1-2 weeks  PCP Please obtain BMP/CBC, 2 view CXR in 1week,  (see Discharge instructions)   PCP Please follow up on the following pending results:     Home Health: None Equipment/Devices: None Consultations: Neurologist over the phone Discharge Condition: Stable    CODE STATUS: Full    Diet Recommendation: Heart Healthy low carbohydrate    Chief Complaint  Patient presents with   Seizures   Head Injury     Brief history of present illness from the day of admission and additional interim summary    68 y.o. female with medical history significant of CVA, anemia, vitamin D  deficiency, diabetes, hypertension.  Patient was brought in via EMS from [facility] health care with reports of a witnessed focal seizure of 3 to 4 minutes.  During the seizure patient hit her head on the side rail of the wall but did not fall out of the wheelchair.  Patient is on Keppra  that was started about 1 year ago: In the ER she was found to have dehydration, hyponatremia, UTI, CT head showed old stroke but nothing acute.  She was admitted for further care.                                                                  Hospital Course   Seizures  - On Keppra  500 twice daily at facility-discussed with neurologist Dr. Salman on 06/13/2024 early morning, this is likely breakthrough seizure, increase the dose of Keppra  to 750 twice daily, head CT nonacute, treat UTI and hyponatremia and monitor.  Neurology nothing else to offer.  Patient close to baseline at this point.   Hyponatremia caused by  dehydration-due to being on diuretic, clinically appears dehydrated, improving with IV fluids, continue hydration for another 15 hours.  Home diuretic discontinued.  Monitor hydration status closely at SNF.   UTI on admission.  Cultures negative treated with Rocephin  x 4 days will give 3 more days of oral Keflex  upon discharge.   History of CVA with severe right-sided hemiparesis, chronic expressive aphasia - PT OT eval as from long-term facility and cover healthcare   Hypokalemia, hypomagnesemia.  Replaced.   Hypertension -blood pressure medications adjusted for better control.   Diabetes type 2  - Lantus  and subcu insulin  to ACHS, check CBGs q. ACHS at SNF and adjust.   Discharge diagnosis     Principal Problem:   Seizures Bailey [name])    Discharge instructions    Discharge Instructions     Discharge instructions   Complete by: As directed  Follow with Primary MD Bailey Randine DEL, MD in 7 days   Get CBC, CMP, Magnesium , 2 view Chest X ray -  checked next visit with your primary MD or SNF MD   Activity: As tolerated with Full fall precautions use walker/cane & assistance as needed  Disposition SNF  Diet: Heart Healthy Low Carb - check CBGs QAC-HS  Special Instructions: If you have smoked or chewed Tobacco  in the last 2 yrs please stop smoking, stop any regular Alcohol  and or any Recreational drug use.  On your next visit with your primary care physician please Get Medicines reviewed and adjusted.  Please request your Prim.MD to go over all Hospital Tests and Procedure/Radiological results at the follow up, please get all Hospital records sent to your Prim MD by signing hospital release before you go home.  If you experience worsening of your admission symptoms, develop shortness of breath, life threatening emergency, suicidal or homicidal thoughts you must seek medical attention immediately by calling 911 or calling your MD immediately  if symptoms less severe.  You Must read  complete instructions/literature along with all the possible adverse reactions/side effects for all the Medicines you take and that have been prescribed to you. Take any new Medicines after you have completely understood and accpet all the possible adverse reactions/side effects.   Do not drive when taking Pain medications.  Do not take more than prescribed Pain, Sleep and Anxiety Medications  Wear Seat belts while driving.   Increase activity slowly   Complete by: As directed        Discharge Medications   Allergies as of 06/15/2024       Reactions   Beef-derived Drug Products Other (See Comments)   Unknown reaction   Pork-derived Products Other (See Comments)   Unknown reaction        Medication List     STOP taking these medications    chlorthalidone 25 MG tablet Commonly known as: HYGROTON   lisinopril  10 MG tablet Commonly known as: ZESTRIL    potassium chloride  SA 20 MEQ tablet Commonly known as: KLOR-CON  M       TAKE these medications    acetaminophen  325 MG tablet Commonly known as: TYLENOL  Take 2 tablets (650 mg total) by mouth every 6 (six) hours as needed (pain).   alendronate  70 MG tablet Commonly known as: FOSAMAX  Take 1 tablet (70 mg total) by mouth every 7 (seven) days. Take with a full glass of water  on an empty stomach.   amLODipine -atorvastatin  5-40 MG tablet Commonly known as: CADUET Take 1 tablet by mouth daily.   cephALEXin  500 MG capsule Commonly known as: KEFLEX  Take 1 capsule (500 mg total) by mouth 3 (three) times daily. 7 day therapy   cholecalciferol  25 MCG (1000 UNIT) tablet Commonly known as: VITAMIN D3 Take 1,000 Units by mouth daily.   feeding supplement (GLUCERNA SHAKE) Liqd Take 120 mLs by mouth as needed (hypoglycemia).   ferrous sulfate  325 (65 FE) MG EC tablet Take 1 tablet (325 mg total) by mouth daily with breakfast. What changed: when to take this   insulin  lispro 100 UNIT/ML injection Commonly known as:  HUMALOG Inject 0-10 Units into the skin 3 (three) times daily before meals. If BG is 71-150 = 0 units, 151-200 = 2 units, 201-250 = 4 units, 251-300 = 6 units, 301-350 = 8 units, 351-400 = 10 units. Notify MD if < 70 or > 400.   Lantus  100 UNIT/ML injection Generic drug: insulin   glargine Inject 0.2 mLs (20 Units total) into the skin daily. What changed: how much to take   levETIRAcetam  750 MG tablet Commonly known as: KEPPRA  Take 1 tablet (750 mg total) by mouth 2 (two) times daily. What changed:  medication strength how much to take   senna 8.6 MG Tabs tablet Commonly known as: SENOKOT Take 1 tablet by mouth every other day.   sertraline  25 MG tablet Commonly known as: ZOLOFT  Take 75 mg by mouth daily.         Follow-up Information     Bailey Randine DEL, MD. Schedule an appointment as soon as possible for a visit in 1 week(s).   Specialty: Family Medicine Contact information: [address] [place] [address] [phone]                 Major procedures and Radiology Reports - PLEASE review detailed and final reports thoroughly  -      CT Head Wo Contrast Result Date: 06/12/2024 CLINICAL DATA:  Head trauma. EXAM: CT HEAD WITHOUT CONTRAST TECHNIQUE: Contiguous axial images were obtained from the base of the skull through the vertex without intravenous contrast. RADIATION DOSE REDUCTION: This exam was performed according to the departmental dose-optimization program which includes automated exposure control, adjustment of the mA and/or kV according to patient size and/or use of iterative reconstruction technique. COMPARISON:  05/24/2024 FINDINGS: Brain: Ventricles and cisterns are unremarkable. Mild age related atrophic change. Old large left MCA territory infarct. Mild chronic ischemic microvascular disease. No mass, mass effect, shift of midline structures or acute hemorrhage. No evidence of acute infarction. Vascular: No hyperdense vessel or unexpected  calcification. Skull: Normal. Negative for fracture or focal lesion. Sinuses/Orbits: Orbits are normal. Mild opacification over the left maxillary sinus unchanged. Other: None. IMPRESSION: 1. No acute findings. 2. Old large left MCA territory infarct. Mild chronic ischemic microvascular disease. 3. Stable inflammatory change left maxillary sinus. Electronically Signed   By: Toribio Agreste M.D.   On: 06/12/2024 16:18   CT Head Wo Contrast Result Date: 05/24/2024 CLINICAL DATA:  Altered mental status EXAM: CT HEAD WITHOUT CONTRAST TECHNIQUE: Contiguous axial images were obtained from the base of the skull through the vertex without intravenous contrast. RADIATION DOSE REDUCTION: This exam was performed according to the departmental dose-optimization program which includes automated exposure control, adjustment of the mA and/or kV according to patient size and/or use of iterative reconstruction technique. COMPARISON:  09/04/2023 FINDINGS: Brain: Large area of infarction with encephalomalacia noted involving much of the left MCA territory and ACA territory, unchanged. No acute intracranial abnormality. Specifically, no hemorrhage, hydrocephalus, mass lesion, acute infarction, or significant intracranial injury. Vascular: No hyperdense vessel or unexpected calcification. Skull: No acute calvarial abnormality. Sinuses/Orbits: Air-fluid level partially visualized in the left maxillary sinus. 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 No acute intracranial abnormality. Potential acute sinusitis in the left maxillary sinus. Electronically Signed   By: Franky Crease M.D.   On: 05/24/2024 14:28   DG Chest 1 View Result Date: 05/24/2024 CLINICAL DATA:  Shortness of breath. EXAM: CHEST  1 VIEW COMPARISON:  Chest radiograph dated 09/08/2023. FINDINGS: No focal consolidation, pleural effusion or pneumothorax. The cardiac silhouette is within limits. Atherosclerotic  calcification of the aortic arch. No acute osseous pathology. IMPRESSION: No active disease. Electronically Signed   By: Vanetta Chou M.D.   On: 05/24/2024 14:25    Micro Results    No results found for this or  any previous visit (from the past 240 hours).  Today   Subjective    Bailey [name] today in bed appears to be in no distress, continues to have baseline expressive aphasia   Objective   Blood pressure (!) 111/48, pulse 85, temperature 97.7 F (36.5 C), temperature source Oral, resp. rate 12, SpO2 95%.   Intake/Output Summary (Last 24 hours) at 06/15/2024 0835 Last data filed at 06/15/2024 0300 Gross per 24 hour  Intake 120.3 ml  Output 700 ml  Net -579.7 ml    Exam  Awake Alert, No new F.N deficits, severe baseline expressive aphasia and right-sided weakness chronic [place].AT,PERRAL Supple Neck,   Symmetrical Chest wall movement, Good air movement bilaterally, CTAB RRR,No Gallops,   +ve B.Sounds, Abd Soft, Non tender,  No Cyanosis, Clubbing or edema    Data Review   Recent Labs  Lab 06/12/24 1511 06/13/24 0321 06/14/24 0346 06/15/24 0440  WBC 14.3* 13.5* 8.0 7.5  HGB 12.3 11.5* 11.5* 10.8*  HCT 37.9 35.2* 34.9* 33.1*  PLT 342 335 315 304  MCV 68.8* 67.4* 68.3* 68.7*  MCH 22.3* 22.0* 22.5* 22.4*  MCHC 32.5 32.7 33.0 32.6  RDW 16.8* 16.4* 17.0* 17.2*  LYMPHSABS 0.9  --  1.9 2.2  MONOABS 0.7  --  1.2* 0.9  EOSABS 0.1  --  0.4 0.4  BASOSABS 0.3*  --  0.0 0.0    Recent Labs  Lab 06/12/24 1511 06/12/24 1630 06/12/24 2148 06/13/24 0010 06/13/24 0321 06/13/24 0941 06/14/24 0346 06/15/24 0440  NA 125* 127* 126* 126* 127*  --  131* 132*  K 3.0* 2.8* 2.7* 3.3* 3.7  --  3.5 3.3*  CL 87* 87* 88* 87* 90*  --  97* 98  CO2 22 24 22 22 25   --  25 24  ANIONGAP 16* 16* 16* 17* 12  --  9 10  GLUCOSE 177* 171* 208* 188* 164*  --  136* 126*  BUN 7* <5* 8 8 6*  --  <5* <5*  CREATININE 0.85 0.87 0.86 0.81 0.78  --  0.66 0.59  AST 24  --   --   --   --    --   --   --   ALT 18  --   --   --   --   --   --   --   ALKPHOS 115  --   --   --   --   --   --   --   BILITOT 1.0  --   --   --   --   --   --   --   ALBUMIN 3.6  --   --   --   --   --   --   --   HGBA1C  --   --   --   --   --  7.3*  --   --   MG  --  1.5*  --   --   --   --  1.9 1.9  CALCIUM  9.2 9.4 9.2 9.2 9.2  --  8.9 8.9    Total Time in preparing paper work, data evaluation and todays exam - 35 minutes  Signature  -    Lavada Stank M.D on 06/15/2024 at 8:35 AM   -  To page go to [email or web address]

## 2024-06-15 NOTE — Plan of Care (Signed)

## 2024-06-15 NOTE — TOC Transition Note (Signed)
 Transition of Care [facility]) - Discharge Note   Patient Details  Name: Bailey [name] MRN: [id number] Date of Birth: [birth year]  [facility]) CM/SW Contact:  Inocente GORMAN Kindle, LCSW Phone Number: 06/15/2024, 10:54 AM   Clinical Narrative:    Patient will DC to: [facility]  Anticipated DC date: 06/15/24 Family notified: Son, D[place]ro Transport by: [place]   Per MD patient ready for DC to [facility]. RN to call report prior to discharge [phone] room 102a). RN, patient, patient's family, and facility notified of DC. Discharge Summary and FL2 sent to facility. DC packet on chart. Ambulance transport requested for patient.   CSW will sign off for now as social work intervention is no longer needed. Please consult us  again if new needs arise.     Final next level of care: Skilled Nursing Facility Barriers to Discharge: Barriers Resolved   Patient Goals and CMS Choice Patient states their goals for this hospitalization and ongoing recovery are:: Return to snf          Discharge Placement   Existing PASRR number confirmed : 06/15/24          Patient chooses bed at: [facility] Patient to be transferred to facility by: [facility] Name of family member notified: Son Patient and family notified of of transfer: 06/15/24  Discharge Plan and Services Additional resources added to the After Visit Summary for   In-house Referral: Clinical Social Work   Post Acute Care Choice: Skilled Nursing Facility                               Social Drivers of Health (SDOH) Interventions SDOH Screenings   Food Insecurity: Unknown (06/12/2024)  Housing: Unknown (06/12/2024)  Transportation Needs: Unknown (06/12/2024)  Utilities: Patient Unable To Answer (06/12/2024)  Alcohol Screen: Low Risk  (10/23/2021)  Depression (PHQ2-9): Low Risk  (10/23/2021)  Financial Resource Strain: Low Risk  (10/23/2021)  Physical Activity: Inactive (10/23/2021)  Social Connections: Patient  Unable To Answer (06/13/2024)  Stress: Stress Concern Present (10/23/2021)  Tobacco Use: High Risk (09/04/2023)     Readmission Risk Interventions     No data to display

## 2024-06-15 NOTE — TOC Progression Note (Signed)
 Transition of Care [facility]) - Progression Note    Patient Details  Name: Bailey [name] MRN: [id number] Date of Birth: [birth year]  Transition of Care [facility]) CM/SW Contact  Inocente GORMAN Kindle, LCSW Phone Number: 06/15/2024, 9:35 AM  Clinical Narrative:    [facility] is ready for patient. CSW updated patient's son who confirmed need for [facility] for transport.    Expected Discharge Plan: Skilled Nursing Facility Barriers to Discharge: Barriers Resolved               Expected Discharge Plan and Services In-house Referral: Clinical Social Work   Post Acute Care Choice: Skilled Nursing Facility Living arrangements for the past 2 months: Skilled Nursing Facility Expected Discharge Date: 06/15/24                                     Social Drivers of Health (SDOH) Interventions SDOH Screenings   Food Insecurity: Unknown (06/12/2024)  Housing: Unknown (06/12/2024)  Transportation Needs: Unknown (06/12/2024)  Utilities: Patient Unable To Answer (06/12/2024)  Alcohol Screen: Low Risk  (10/23/2021)  Depression (PHQ2-9): Low Risk  (10/23/2021)  Financial Resource Strain: Low Risk  (10/23/2021)  Physical Activity: Inactive (10/23/2021)  Social Connections: Patient Unable To Answer (06/13/2024)  Stress: Stress Concern Present (10/23/2021)  Tobacco Use: High Risk (09/04/2023)    Readmission Risk Interventions     No data to display

## 2024-06-15 NOTE — Discharge Instructions (Signed)
 Follow with Primary MD Richrd Randine DEL, MD in 7 days   Get CBC, CMP, Magnesium , 2 view Chest X ray -  checked next visit with your primary MD or SNF MD   Activity: As tolerated with Full fall precautions use walker/cane & assistance as needed  Disposition SNF  Diet: Heart Healthy Low Carb - check CBGs QAC-HS  Special Instructions: If you have smoked or chewed Tobacco  in the last 2 yrs please stop smoking, stop any regular Alcohol  and or any Recreational drug use.  On your next visit with your primary care physician please Get Medicines reviewed and adjusted.  Please request your Prim.MD to go over all Hospital Tests and Procedure/Radiological results at the follow up, please get all Hospital records sent to your Prim MD by signing hospital release before you go home.  If you experience worsening of your admission symptoms, develop shortness of breath, life threatening emergency, suicidal or homicidal thoughts you must seek medical attention immediately by calling 911 or calling your MD immediately  if symptoms less severe.  You Must read complete instructions/literature along with all the possible adverse reactions/side effects for all the Medicines you take and that have been prescribed to you. Take any new Medicines after you have completely understood and accpet all the possible adverse reactions/side effects.   Do not drive when taking Pain medications.  Do not take more than prescribed Pain, Sleep and Anxiety Medications  Wear Seat belts while driving.

## 2024-06-16 DIAGNOSIS — N181 Chronic kidney disease, stage 1: Secondary | ICD-10-CM | POA: Diagnosis not present

## 2024-06-16 DIAGNOSIS — E1122 Type 2 diabetes mellitus with diabetic chronic kidney disease: Secondary | ICD-10-CM | POA: Diagnosis not present

## 2024-06-16 DIAGNOSIS — F411 Generalized anxiety disorder: Secondary | ICD-10-CM | POA: Diagnosis not present

## 2024-06-16 DIAGNOSIS — K219 Gastro-esophageal reflux disease without esophagitis: Secondary | ICD-10-CM | POA: Diagnosis not present

## 2024-06-16 DIAGNOSIS — I69951 Hemiplegia and hemiparesis following unspecified cerebrovascular disease affecting right dominant side: Secondary | ICD-10-CM | POA: Diagnosis not present

## 2024-06-16 DIAGNOSIS — E1165 Type 2 diabetes mellitus with hyperglycemia: Secondary | ICD-10-CM | POA: Diagnosis not present

## 2024-06-16 DIAGNOSIS — I2510 Atherosclerotic heart disease of native coronary artery without angina pectoris: Secondary | ICD-10-CM | POA: Diagnosis not present

## 2024-06-16 DIAGNOSIS — E1140 Type 2 diabetes mellitus with diabetic neuropathy, unspecified: Secondary | ICD-10-CM | POA: Diagnosis not present

## 2024-06-16 DIAGNOSIS — Z794 Long term (current) use of insulin: Secondary | ICD-10-CM | POA: Diagnosis not present

## 2024-06-16 LAB — URINE CULTURE: Culture: >=100000 — AB

## 2024-06-19 DIAGNOSIS — I69320 Aphasia following cerebral infarction: Secondary | ICD-10-CM | POA: Diagnosis not present

## 2024-06-19 DIAGNOSIS — R531 Weakness: Secondary | ICD-10-CM | POA: Diagnosis not present

## 2024-06-19 DIAGNOSIS — N390 Urinary tract infection, site not specified: Secondary | ICD-10-CM | POA: Diagnosis not present

## 2024-06-19 DIAGNOSIS — M6281 Muscle weakness (generalized): Secondary | ICD-10-CM | POA: Diagnosis not present

## 2024-06-19 DIAGNOSIS — Z7409 Other reduced mobility: Secondary | ICD-10-CM | POA: Diagnosis not present

## 2024-06-20 DIAGNOSIS — S31000A Unspecified open wound of lower back and pelvis without penetration into retroperitoneum, initial encounter: Secondary | ICD-10-CM | POA: Diagnosis not present

## 2024-06-20 DIAGNOSIS — E785 Hyperlipidemia, unspecified: Secondary | ICD-10-CM | POA: Diagnosis not present

## 2024-06-20 DIAGNOSIS — N390 Urinary tract infection, site not specified: Secondary | ICD-10-CM | POA: Diagnosis not present

## 2024-06-20 DIAGNOSIS — L89153 Pressure ulcer of sacral region, stage 3: Secondary | ICD-10-CM | POA: Diagnosis not present

## 2024-06-20 DIAGNOSIS — R569 Unspecified convulsions: Secondary | ICD-10-CM | POA: Diagnosis not present

## 2024-06-20 DIAGNOSIS — R531 Weakness: Secondary | ICD-10-CM | POA: Diagnosis not present

## 2024-06-20 DIAGNOSIS — D649 Anemia, unspecified: Secondary | ICD-10-CM | POA: Diagnosis not present

## 2024-06-20 DIAGNOSIS — Z7409 Other reduced mobility: Secondary | ICD-10-CM | POA: Diagnosis not present

## 2024-06-20 DIAGNOSIS — E118 Type 2 diabetes mellitus with unspecified complications: Secondary | ICD-10-CM | POA: Diagnosis not present

## 2024-06-21 DIAGNOSIS — R100 Acute abdomen: Secondary | ICD-10-CM | POA: Diagnosis not present

## 2024-06-21 DIAGNOSIS — I63232 Cerebral infarction due to unspecified occlusion or stenosis of left carotid arteries: Secondary | ICD-10-CM | POA: Diagnosis not present

## 2024-06-21 DIAGNOSIS — N184 Chronic kidney disease, stage 4 (severe): Secondary | ICD-10-CM | POA: Diagnosis not present

## 2024-06-21 DIAGNOSIS — D649 Anemia, unspecified: Secondary | ICD-10-CM | POA: Diagnosis not present

## 2024-06-21 DIAGNOSIS — R569 Unspecified convulsions: Secondary | ICD-10-CM | POA: Diagnosis not present

## 2024-06-21 DIAGNOSIS — E1122 Type 2 diabetes mellitus with diabetic chronic kidney disease: Secondary | ICD-10-CM | POA: Diagnosis not present

## 2024-06-21 DIAGNOSIS — N390 Urinary tract infection, site not specified: Secondary | ICD-10-CM | POA: Diagnosis not present

## 2024-06-21 DIAGNOSIS — R531 Weakness: Secondary | ICD-10-CM | POA: Diagnosis not present

## 2024-06-21 DIAGNOSIS — G4089 Other seizures: Secondary | ICD-10-CM | POA: Diagnosis not present

## 2024-06-21 DIAGNOSIS — Z8673 Personal history of transient ischemic attack (TIA), and cerebral infarction without residual deficits: Secondary | ICD-10-CM | POA: Diagnosis not present

## 2024-06-22 DIAGNOSIS — M6281 Muscle weakness (generalized): Secondary | ICD-10-CM | POA: Diagnosis not present

## 2024-06-22 DIAGNOSIS — I69320 Aphasia following cerebral infarction: Secondary | ICD-10-CM | POA: Diagnosis not present

## 2024-06-22 DIAGNOSIS — R569 Unspecified convulsions: Secondary | ICD-10-CM | POA: Diagnosis not present

## 2024-06-22 DIAGNOSIS — R109 Unspecified abdominal pain: Secondary | ICD-10-CM | POA: Diagnosis not present

## 2024-06-22 DIAGNOSIS — G4089 Other seizures: Secondary | ICD-10-CM | POA: Diagnosis not present

## 2024-06-22 DIAGNOSIS — D509 Iron deficiency anemia, unspecified: Secondary | ICD-10-CM | POA: Diagnosis not present

## 2024-06-23 DIAGNOSIS — M6281 Muscle weakness (generalized): Secondary | ICD-10-CM | POA: Diagnosis not present

## 2024-06-26 DIAGNOSIS — M6281 Muscle weakness (generalized): Secondary | ICD-10-CM | POA: Diagnosis not present

## 2024-06-26 DIAGNOSIS — L89153 Pressure ulcer of sacral region, stage 3: Secondary | ICD-10-CM | POA: Diagnosis not present

## 2024-06-26 DIAGNOSIS — G4089 Other seizures: Secondary | ICD-10-CM | POA: Diagnosis not present

## 2024-06-27 DIAGNOSIS — L89153 Pressure ulcer of sacral region, stage 3: Secondary | ICD-10-CM | POA: Diagnosis not present

## 2024-06-27 DIAGNOSIS — E785 Hyperlipidemia, unspecified: Secondary | ICD-10-CM | POA: Diagnosis not present

## 2024-06-27 DIAGNOSIS — E118 Type 2 diabetes mellitus with unspecified complications: Secondary | ICD-10-CM | POA: Diagnosis not present

## 2024-06-27 DIAGNOSIS — D649 Anemia, unspecified: Secondary | ICD-10-CM | POA: Diagnosis not present

## 2024-06-28 DIAGNOSIS — M6281 Muscle weakness (generalized): Secondary | ICD-10-CM | POA: Diagnosis not present

## 2024-06-28 DIAGNOSIS — G4089 Other seizures: Secondary | ICD-10-CM | POA: Diagnosis not present

## 2024-06-29 DIAGNOSIS — G4089 Other seizures: Secondary | ICD-10-CM | POA: Diagnosis not present

## 2024-06-29 DIAGNOSIS — R531 Weakness: Secondary | ICD-10-CM | POA: Diagnosis not present

## 2024-06-29 DIAGNOSIS — M6281 Muscle weakness (generalized): Secondary | ICD-10-CM | POA: Diagnosis not present

## 2024-06-29 DIAGNOSIS — Z7409 Other reduced mobility: Secondary | ICD-10-CM | POA: Diagnosis not present

## 2024-06-29 DIAGNOSIS — R569 Unspecified convulsions: Secondary | ICD-10-CM | POA: Diagnosis not present

## 2024-07-01 DIAGNOSIS — M6281 Muscle weakness (generalized): Secondary | ICD-10-CM | POA: Diagnosis not present

## 2024-07-04 DIAGNOSIS — G4089 Other seizures: Secondary | ICD-10-CM | POA: Diagnosis not present

## 2024-07-04 DIAGNOSIS — M6281 Muscle weakness (generalized): Secondary | ICD-10-CM | POA: Diagnosis not present

## 2024-07-05 DIAGNOSIS — M6281 Muscle weakness (generalized): Secondary | ICD-10-CM | POA: Diagnosis not present

## 2024-07-05 DIAGNOSIS — G4089 Other seizures: Secondary | ICD-10-CM | POA: Diagnosis not present

## 2024-07-05 DIAGNOSIS — I63232 Cerebral infarction due to unspecified occlusion or stenosis of left carotid arteries: Secondary | ICD-10-CM | POA: Diagnosis not present

## 2024-07-06 DIAGNOSIS — G4089 Other seizures: Secondary | ICD-10-CM | POA: Diagnosis not present

## 2024-07-06 DIAGNOSIS — M6281 Muscle weakness (generalized): Secondary | ICD-10-CM | POA: Diagnosis not present

## 2024-07-07 DIAGNOSIS — M6281 Muscle weakness (generalized): Secondary | ICD-10-CM | POA: Diagnosis not present

## 2024-07-08 DIAGNOSIS — M6281 Muscle weakness (generalized): Secondary | ICD-10-CM | POA: Diagnosis not present

## 2024-07-08 DIAGNOSIS — G4089 Other seizures: Secondary | ICD-10-CM | POA: Diagnosis not present

## 2024-07-10 DIAGNOSIS — M6281 Muscle weakness (generalized): Secondary | ICD-10-CM | POA: Diagnosis not present

## 2024-07-10 DIAGNOSIS — G4089 Other seizures: Secondary | ICD-10-CM | POA: Diagnosis not present

## 2024-07-11 DIAGNOSIS — M6281 Muscle weakness (generalized): Secondary | ICD-10-CM | POA: Diagnosis not present

## 2024-07-11 DIAGNOSIS — G4089 Other seizures: Secondary | ICD-10-CM | POA: Diagnosis not present

## 2024-07-12 DIAGNOSIS — I63232 Cerebral infarction due to unspecified occlusion or stenosis of left carotid arteries: Secondary | ICD-10-CM | POA: Diagnosis not present

## 2024-07-12 DIAGNOSIS — M6281 Muscle weakness (generalized): Secondary | ICD-10-CM | POA: Diagnosis not present

## 2024-07-13 DIAGNOSIS — G3184 Mild cognitive impairment, so stated: Secondary | ICD-10-CM | POA: Diagnosis not present

## 2024-07-13 DIAGNOSIS — G8929 Other chronic pain: Secondary | ICD-10-CM | POA: Diagnosis not present

## 2024-07-13 DIAGNOSIS — M6281 Muscle weakness (generalized): Secondary | ICD-10-CM | POA: Diagnosis not present

## 2024-07-13 DIAGNOSIS — R531 Weakness: Secondary | ICD-10-CM | POA: Diagnosis not present

## 2024-07-13 DIAGNOSIS — F5105 Insomnia due to other mental disorder: Secondary | ICD-10-CM | POA: Diagnosis not present

## 2024-07-13 DIAGNOSIS — I69320 Aphasia following cerebral infarction: Secondary | ICD-10-CM | POA: Diagnosis not present

## 2024-07-13 DIAGNOSIS — F411 Generalized anxiety disorder: Secondary | ICD-10-CM | POA: Diagnosis not present

## 2024-07-13 DIAGNOSIS — G4089 Other seizures: Secondary | ICD-10-CM | POA: Diagnosis not present

## 2024-07-13 DIAGNOSIS — Z7409 Other reduced mobility: Secondary | ICD-10-CM | POA: Diagnosis not present

## 2024-07-13 DIAGNOSIS — F331 Major depressive disorder, recurrent, moderate: Secondary | ICD-10-CM | POA: Diagnosis not present

## 2024-07-14 DIAGNOSIS — M6281 Muscle weakness (generalized): Secondary | ICD-10-CM | POA: Diagnosis not present

## 2024-07-17 DIAGNOSIS — M6281 Muscle weakness (generalized): Secondary | ICD-10-CM | POA: Diagnosis not present

## 2024-07-17 DIAGNOSIS — G4089 Other seizures: Secondary | ICD-10-CM | POA: Diagnosis not present

## 2024-07-18 DIAGNOSIS — G4089 Other seizures: Secondary | ICD-10-CM | POA: Diagnosis not present

## 2024-07-18 DIAGNOSIS — M6281 Muscle weakness (generalized): Secondary | ICD-10-CM | POA: Diagnosis not present

## 2024-07-18 DIAGNOSIS — Z7409 Other reduced mobility: Secondary | ICD-10-CM | POA: Diagnosis not present

## 2024-07-18 DIAGNOSIS — R569 Unspecified convulsions: Secondary | ICD-10-CM | POA: Diagnosis not present

## 2024-07-18 DIAGNOSIS — R531 Weakness: Secondary | ICD-10-CM | POA: Diagnosis not present

## 2024-07-18 DIAGNOSIS — I69320 Aphasia following cerebral infarction: Secondary | ICD-10-CM | POA: Diagnosis not present

## 2024-07-19 DIAGNOSIS — G4089 Other seizures: Secondary | ICD-10-CM | POA: Diagnosis not present

## 2024-07-19 DIAGNOSIS — M6281 Muscle weakness (generalized): Secondary | ICD-10-CM | POA: Diagnosis not present

## 2024-08-02 DIAGNOSIS — R5381 Other malaise: Secondary | ICD-10-CM | POA: Diagnosis not present

## 2024-08-02 DIAGNOSIS — I63232 Cerebral infarction due to unspecified occlusion or stenosis of left carotid arteries: Secondary | ICD-10-CM | POA: Diagnosis not present

## 2024-08-02 DIAGNOSIS — R41841 Cognitive communication deficit: Secondary | ICD-10-CM | POA: Diagnosis not present

## 2024-08-02 DIAGNOSIS — R569 Unspecified convulsions: Secondary | ICD-10-CM | POA: Diagnosis not present

## 2024-08-02 DIAGNOSIS — R531 Weakness: Secondary | ICD-10-CM | POA: Diagnosis not present

## 2024-08-03 DIAGNOSIS — F331 Major depressive disorder, recurrent, moderate: Secondary | ICD-10-CM | POA: Diagnosis not present

## 2024-08-03 DIAGNOSIS — F411 Generalized anxiety disorder: Secondary | ICD-10-CM | POA: Diagnosis not present

## 2024-08-03 DIAGNOSIS — R109 Unspecified abdominal pain: Secondary | ICD-10-CM | POA: Diagnosis not present

## 2024-08-03 DIAGNOSIS — I69320 Aphasia following cerebral infarction: Secondary | ICD-10-CM | POA: Diagnosis not present

## 2024-08-03 DIAGNOSIS — R100 Acute abdomen: Secondary | ICD-10-CM | POA: Diagnosis not present

## 2024-08-03 DIAGNOSIS — N390 Urinary tract infection, site not specified: Secondary | ICD-10-CM | POA: Diagnosis not present

## 2024-08-03 DIAGNOSIS — R5381 Other malaise: Secondary | ICD-10-CM | POA: Diagnosis not present

## 2024-08-03 DIAGNOSIS — G3184 Mild cognitive impairment, so stated: Secondary | ICD-10-CM | POA: Diagnosis not present

## 2024-08-03 DIAGNOSIS — I152 Hypertension secondary to endocrine disorders: Secondary | ICD-10-CM | POA: Diagnosis not present

## 2024-08-03 DIAGNOSIS — R531 Weakness: Secondary | ICD-10-CM | POA: Diagnosis not present

## 2024-08-03 DIAGNOSIS — F5105 Insomnia due to other mental disorder: Secondary | ICD-10-CM | POA: Diagnosis not present

## 2024-08-03 DIAGNOSIS — R569 Unspecified convulsions: Secondary | ICD-10-CM | POA: Diagnosis not present

## 2024-08-04 DIAGNOSIS — R5381 Other malaise: Secondary | ICD-10-CM | POA: Diagnosis not present

## 2024-08-04 DIAGNOSIS — Z7409 Other reduced mobility: Secondary | ICD-10-CM | POA: Diagnosis not present

## 2024-08-04 DIAGNOSIS — R109 Unspecified abdominal pain: Secondary | ICD-10-CM | POA: Diagnosis not present

## 2024-08-04 DIAGNOSIS — R531 Weakness: Secondary | ICD-10-CM | POA: Diagnosis not present

## 2024-08-04 DIAGNOSIS — N390 Urinary tract infection, site not specified: Secondary | ICD-10-CM | POA: Diagnosis not present

## 2024-08-05 DIAGNOSIS — N390 Urinary tract infection, site not specified: Secondary | ICD-10-CM | POA: Diagnosis not present

## 2024-08-05 DIAGNOSIS — R110 Nausea: Secondary | ICD-10-CM | POA: Diagnosis not present

## 2024-08-05 DIAGNOSIS — R109 Unspecified abdominal pain: Secondary | ICD-10-CM | POA: Diagnosis not present

## 2024-08-05 DIAGNOSIS — R5381 Other malaise: Secondary | ICD-10-CM | POA: Diagnosis not present

## 2024-08-05 DIAGNOSIS — Z7409 Other reduced mobility: Secondary | ICD-10-CM | POA: Diagnosis not present

## 2024-08-06 DIAGNOSIS — A499 Bacterial infection, unspecified: Secondary | ICD-10-CM | POA: Diagnosis not present

## 2024-08-06 DIAGNOSIS — Z1612 Extended spectrum beta lactamase (ESBL) resistance: Secondary | ICD-10-CM | POA: Diagnosis not present

## 2024-08-06 DIAGNOSIS — N390 Urinary tract infection, site not specified: Secondary | ICD-10-CM | POA: Diagnosis not present

## 2024-08-06 DIAGNOSIS — R109 Unspecified abdominal pain: Secondary | ICD-10-CM | POA: Diagnosis not present

## 2024-08-06 DIAGNOSIS — Z7409 Other reduced mobility: Secondary | ICD-10-CM | POA: Diagnosis not present

## 2024-08-06 DIAGNOSIS — R531 Weakness: Secondary | ICD-10-CM | POA: Diagnosis not present

## 2024-08-07 DIAGNOSIS — N390 Urinary tract infection, site not specified: Secondary | ICD-10-CM | POA: Diagnosis not present

## 2024-08-07 DIAGNOSIS — R531 Weakness: Secondary | ICD-10-CM | POA: Diagnosis not present

## 2024-08-07 DIAGNOSIS — Z1612 Extended spectrum beta lactamase (ESBL) resistance: Secondary | ICD-10-CM | POA: Diagnosis not present

## 2024-08-07 DIAGNOSIS — Z7409 Other reduced mobility: Secondary | ICD-10-CM | POA: Diagnosis not present

## 2024-08-08 DIAGNOSIS — I152 Hypertension secondary to endocrine disorders: Secondary | ICD-10-CM | POA: Diagnosis not present

## 2024-08-08 DIAGNOSIS — R531 Weakness: Secondary | ICD-10-CM | POA: Diagnosis not present

## 2024-08-08 DIAGNOSIS — R569 Unspecified convulsions: Secondary | ICD-10-CM | POA: Diagnosis not present

## 2024-08-08 DIAGNOSIS — Z7409 Other reduced mobility: Secondary | ICD-10-CM | POA: Diagnosis not present

## 2024-08-08 DIAGNOSIS — E1122 Type 2 diabetes mellitus with diabetic chronic kidney disease: Secondary | ICD-10-CM | POA: Diagnosis not present

## 2024-08-08 DIAGNOSIS — N390 Urinary tract infection, site not specified: Secondary | ICD-10-CM | POA: Diagnosis not present

## 2024-08-09 DIAGNOSIS — I63232 Cerebral infarction due to unspecified occlusion or stenosis of left carotid arteries: Secondary | ICD-10-CM | POA: Diagnosis not present

## 2024-08-10 DIAGNOSIS — N390 Urinary tract infection, site not specified: Secondary | ICD-10-CM | POA: Diagnosis not present

## 2024-08-10 DIAGNOSIS — Z7409 Other reduced mobility: Secondary | ICD-10-CM | POA: Diagnosis not present

## 2024-08-10 DIAGNOSIS — R531 Weakness: Secondary | ICD-10-CM | POA: Diagnosis not present

## 2024-08-12 DIAGNOSIS — Z1612 Extended spectrum beta lactamase (ESBL) resistance: Secondary | ICD-10-CM | POA: Diagnosis not present

## 2024-08-12 DIAGNOSIS — N390 Urinary tract infection, site not specified: Secondary | ICD-10-CM | POA: Diagnosis not present

## 2024-08-12 DIAGNOSIS — R109 Unspecified abdominal pain: Secondary | ICD-10-CM | POA: Diagnosis not present

## 2024-08-12 DIAGNOSIS — R112 Nausea with vomiting, unspecified: Secondary | ICD-10-CM | POA: Diagnosis not present

## 2024-08-12 DIAGNOSIS — Z7409 Other reduced mobility: Secondary | ICD-10-CM | POA: Diagnosis not present

## 2024-08-12 DIAGNOSIS — R531 Weakness: Secondary | ICD-10-CM | POA: Diagnosis not present

## 2024-08-13 DIAGNOSIS — N390 Urinary tract infection, site not specified: Secondary | ICD-10-CM | POA: Diagnosis not present

## 2024-08-13 DIAGNOSIS — Z7409 Other reduced mobility: Secondary | ICD-10-CM | POA: Diagnosis not present

## 2024-08-13 DIAGNOSIS — R112 Nausea with vomiting, unspecified: Secondary | ICD-10-CM | POA: Diagnosis not present

## 2024-08-13 DIAGNOSIS — R109 Unspecified abdominal pain: Secondary | ICD-10-CM | POA: Diagnosis not present

## 2024-08-13 DIAGNOSIS — R5381 Other malaise: Secondary | ICD-10-CM | POA: Diagnosis not present

## 2024-08-13 DIAGNOSIS — Z1612 Extended spectrum beta lactamase (ESBL) resistance: Secondary | ICD-10-CM | POA: Diagnosis not present

## 2024-08-14 DIAGNOSIS — R112 Nausea with vomiting, unspecified: Secondary | ICD-10-CM | POA: Diagnosis not present

## 2024-08-14 DIAGNOSIS — R100 Acute abdomen: Secondary | ICD-10-CM | POA: Diagnosis not present

## 2024-08-14 DIAGNOSIS — I69351 Hemiplegia and hemiparesis following cerebral infarction affecting right dominant side: Secondary | ICD-10-CM | POA: Diagnosis not present

## 2024-08-15 DIAGNOSIS — N390 Urinary tract infection, site not specified: Secondary | ICD-10-CM | POA: Diagnosis not present

## 2024-08-15 DIAGNOSIS — R5381 Other malaise: Secondary | ICD-10-CM | POA: Diagnosis not present

## 2024-08-15 DIAGNOSIS — R531 Weakness: Secondary | ICD-10-CM | POA: Diagnosis not present

## 2024-08-15 DIAGNOSIS — Z1612 Extended spectrum beta lactamase (ESBL) resistance: Secondary | ICD-10-CM | POA: Diagnosis not present

## 2024-08-15 DIAGNOSIS — R112 Nausea with vomiting, unspecified: Secondary | ICD-10-CM | POA: Diagnosis not present

## 2024-08-17 DIAGNOSIS — R5381 Other malaise: Secondary | ICD-10-CM | POA: Diagnosis not present

## 2024-08-17 DIAGNOSIS — R531 Weakness: Secondary | ICD-10-CM | POA: Diagnosis not present

## 2024-08-17 DIAGNOSIS — N390 Urinary tract infection, site not specified: Secondary | ICD-10-CM | POA: Diagnosis not present

## 2024-08-23 DIAGNOSIS — B372 Candidiasis of skin and nail: Secondary | ICD-10-CM | POA: Diagnosis not present

## 2024-08-23 DIAGNOSIS — K047 Periapical abscess without sinus: Secondary | ICD-10-CM | POA: Diagnosis not present

## 2024-08-23 DIAGNOSIS — K1379 Other lesions of oral mucosa: Secondary | ICD-10-CM | POA: Diagnosis not present

## 2024-08-25 DIAGNOSIS — R5381 Other malaise: Secondary | ICD-10-CM | POA: Diagnosis not present

## 2024-08-25 DIAGNOSIS — K1379 Other lesions of oral mucosa: Secondary | ICD-10-CM | POA: Diagnosis not present

## 2024-08-25 DIAGNOSIS — K047 Periapical abscess without sinus: Secondary | ICD-10-CM | POA: Diagnosis not present

## 2024-08-29 DIAGNOSIS — R531 Weakness: Secondary | ICD-10-CM | POA: Diagnosis not present

## 2024-08-29 DIAGNOSIS — R5381 Other malaise: Secondary | ICD-10-CM | POA: Diagnosis not present

## 2024-08-29 DIAGNOSIS — K047 Periapical abscess without sinus: Secondary | ICD-10-CM | POA: Diagnosis not present

## 2024-08-29 DIAGNOSIS — B372 Candidiasis of skin and nail: Secondary | ICD-10-CM | POA: Diagnosis not present

## 2024-09-17 ENCOUNTER — Emergency Department (HOSPITAL_COMMUNITY)

## 2024-09-17 ENCOUNTER — Other Ambulatory Visit: Payer: Self-pay

## 2024-09-17 ENCOUNTER — Observation Stay (HOSPITAL_COMMUNITY)
Admission: EM | Admit: 2024-09-17 | Discharge: 2024-09-19 | Disposition: A | Discharge status: Home / Self Care | Source: Skilled Nursing Facility | Attending: Family Medicine | Admitting: Family Medicine

## 2024-09-17 DIAGNOSIS — I10 Essential (primary) hypertension: Secondary | ICD-10-CM | POA: Diagnosis not present

## 2024-09-17 DIAGNOSIS — I69351 Hemiplegia and hemiparesis following cerebral infarction affecting right dominant side: Secondary | ICD-10-CM | POA: Insufficient documentation

## 2024-09-17 DIAGNOSIS — N3000 Acute cystitis without hematuria: Secondary | ICD-10-CM | POA: Diagnosis not present

## 2024-09-17 DIAGNOSIS — G4089 Other seizures: Secondary | ICD-10-CM | POA: Diagnosis present

## 2024-09-17 DIAGNOSIS — F17210 Nicotine dependence, cigarettes, uncomplicated: Secondary | ICD-10-CM | POA: Diagnosis not present

## 2024-09-17 DIAGNOSIS — F32A Depression, unspecified: Secondary | ICD-10-CM | POA: Insufficient documentation

## 2024-09-17 DIAGNOSIS — Z79899 Other long term (current) drug therapy: Secondary | ICD-10-CM | POA: Diagnosis not present

## 2024-09-17 DIAGNOSIS — M6281 Muscle weakness (generalized): Secondary | ICD-10-CM | POA: Diagnosis not present

## 2024-09-17 DIAGNOSIS — G40919 Epilepsy, unspecified, intractable, without status epilepticus: Secondary | ICD-10-CM

## 2024-09-17 DIAGNOSIS — Z794 Long term (current) use of insulin: Secondary | ICD-10-CM | POA: Diagnosis not present

## 2024-09-17 DIAGNOSIS — R404 Transient alteration of awareness: Principal | ICD-10-CM | POA: Diagnosis present

## 2024-09-17 DIAGNOSIS — F419 Anxiety disorder, unspecified: Secondary | ICD-10-CM | POA: Diagnosis not present

## 2024-09-17 DIAGNOSIS — E119 Type 2 diabetes mellitus without complications: Secondary | ICD-10-CM | POA: Diagnosis not present

## 2024-09-17 DIAGNOSIS — Z8673 Personal history of transient ischemic attack (TIA), and cerebral infarction without residual deficits: Secondary | ICD-10-CM | POA: Insufficient documentation

## 2024-09-17 LAB — URINALYSIS, ROUTINE W REFLEX MICROSCOPIC
Bilirubin Urine: NEGATIVE
Glucose, UA: NEGATIVE mg/dL
Ketones, ur: NEGATIVE mg/dL
Nitrite: NEGATIVE
Protein, ur: 100 mg/dL — AB
Specific Gravity, Urine: 1.01 (ref 1.005–1.030)
WBC, UA: >50 WBC/hpf (ref 0–5)
pH: 8 (ref 5.0–8.0)

## 2024-09-17 LAB — CBC
HCT: 37.9 % (ref 36.0–46.0)
Hemoglobin: 12 g/dL (ref 12.0–15.0)
MCH: 23 pg — ABNORMAL LOW (ref 26.0–34.0)
MCHC: 31.7 g/dL (ref 30.0–36.0)
MCV: 72.6 fL — ABNORMAL LOW (ref 80.0–100.0)
Platelets: 344 K/uL (ref 150–400)
RBC: 5.22 MIL/uL — ABNORMAL HIGH (ref 3.87–5.11)
RDW: 16.7 % — ABNORMAL HIGH (ref 11.5–15.5)
WBC: 9.9 K/uL (ref 4.0–10.5)
nRBC: 0 % (ref 0.0–0.2)

## 2024-09-17 LAB — COMPREHENSIVE METABOLIC PANEL WITH GFR
ALT: 31 U/L (ref 0–44)
AST: 26 U/L (ref 15–41)
Albumin: 3.9 g/dL (ref 3.5–5.0)
Alkaline Phosphatase: 135 U/L — ABNORMAL HIGH (ref 38–126)
Anion gap: 12 (ref 5–15)
BUN: 11 mg/dL (ref 8–23)
CO2: 22 mmol/L (ref 22–32)
Calcium: 9.6 mg/dL (ref 8.9–10.3)
Chloride: 105 mmol/L (ref 98–111)
Creatinine, Ser: 0.55 mg/dL (ref 0.44–1.00)
GFR, Estimated: >60 mL/min
Glucose, Bld: 129 mg/dL — ABNORMAL HIGH (ref 70–99)
Potassium: 3.7 mmol/L (ref 3.5–5.1)
Sodium: 138 mmol/L (ref 135–145)
Total Bilirubin: 0.2 mg/dL (ref 0.0–1.2)
Total Protein: 7.7 g/dL (ref 6.5–8.1)

## 2024-09-17 MED ORDER — SODIUM CHLORIDE 0.9 % IV SOLN
1.0000 g | Freq: Once | INTRAVENOUS | Status: AC
  Administered 2024-09-17: 1 g via INTRAVENOUS
  Filled 2024-09-17: qty 20

## 2024-09-17 MED ORDER — LEVETIRACETAM (KEPPRA) 500 MG/5 ML ADULT IV PUSH
750.0000 mg | Freq: Once | INTRAVENOUS | Status: AC
  Administered 2024-09-17: 750 mg via INTRAVENOUS
  Filled 2024-09-17: qty 10

## 2024-09-17 MED ORDER — SENNA 8.6 MG PO TABS
1.0000 | ORAL_TABLET | ORAL | Status: DC
  Administered 2024-09-18: 8.6 mg via ORAL

## 2024-09-17 MED ORDER — LEVETIRACETAM 500 MG PO TABS
1000.0000 mg | ORAL_TABLET | Freq: Two times a day (BID) | ORAL | Status: DC
  Administered 2024-09-18 – 2024-09-19 (×3): 1000 mg via ORAL

## 2024-09-17 MED ORDER — ACETAMINOPHEN 325 MG PO TABS: 650.0000 mg | ORAL_TABLET | Freq: Four times a day (QID) | ORAL | Status: DC | PRN

## 2024-09-17 MED ORDER — ACETAMINOPHEN 650 MG RE SUPP: 650.0000 mg | Freq: Four times a day (QID) | RECTAL | Status: DC | PRN

## 2024-09-17 MED ORDER — AMLODIPINE-ATORVASTATIN 5-40 MG PO TABS: 1.0000 | ORAL_TABLET | Freq: Every day | ORAL | Status: DC

## 2024-09-17 MED ORDER — AMLODIPINE BESYLATE 5 MG PO TABS
5.0000 mg | ORAL_TABLET | Freq: Every day | ORAL | Status: DC
  Administered 2024-09-18 – 2024-09-19 (×2): 5 mg via ORAL

## 2024-09-17 MED ORDER — RIVAROXABAN 10 MG PO TABS
10.0000 mg | ORAL_TABLET | Freq: Every day | ORAL | Status: DC
  Administered 2024-09-18 – 2024-09-19 (×2): 10 mg via ORAL

## 2024-09-17 MED ORDER — INSULIN GLARGINE 100 UNIT/ML ~~LOC~~ SOLN
20.0000 [IU] | Freq: Every day | SUBCUTANEOUS | Status: DC
  Administered 2024-09-18 – 2024-09-19 (×2): 20 [IU] via SUBCUTANEOUS
  Filled 2024-09-17: qty 0.2

## 2024-09-17 MED ORDER — INSULIN ASPART 100 UNIT/ML IJ SOLN
0.0000 [IU] | Freq: Three times a day (TID) | INTRAMUSCULAR | Status: DC
  Administered 2024-09-18 (×2): 2 [IU] via SUBCUTANEOUS
  Administered 2024-09-18: 1 [IU] via SUBCUTANEOUS
  Administered 2024-09-19 (×2): 2 [IU] via SUBCUTANEOUS

## 2024-09-17 MED ORDER — ATORVASTATIN CALCIUM 40 MG PO TABS
40.0000 mg | ORAL_TABLET | Freq: Every day | ORAL | Status: DC
  Administered 2024-09-18 – 2024-09-19 (×2): 40 mg via ORAL

## 2024-09-17 NOTE — H&P (Signed)
 " History and Physical    Bailey [name] FMW:[id number] DOB: 27-Jan-1956 DOA: 09/17/2024  Patient coming from: Skilled nursing facility.  Chief Complaint: Unresponsive episode.    History obtained from patient's son.  HPI: Bailey [name] is a 68 y.o. female with history of stroke with right-sided hemiplegia and nonverbal at baseline, seizures, hypertension, diabetes mellitus type 2 was brought to the ER after patient was found to be unresponsive for at least 10 minutes at the living facility.  Patient was appearing confused and was not at her baseline and slowly started improving on the way.  Most of the history was obtained from patient's son Bailey [name].    ED Course: In the ER patient's mental status was back to baseline.  CT head is unremarkable.  Patient is afebrile.  UA is concerning for UTI and since patient had ESBL UTI on cultures in September 2025 was started on meropenem .  Discussed with neurologist Dr. Khaliqdina and patient's Keppra  dose was increased after loading dose was given.  Labs show sodium 138 creatinine 0.5 blood glucose 129 WBC 9.9 hemoglobin 12.  Review of Systems: As per HPI, rest all negative.   Past Medical History:  Diagnosis Date   Arthritis    Cataract    COPD (chronic obstructive pulmonary disease) (HCC)    Diabetes mellitus without complication (HCC)    Fibrocystic breast disease July 2016   GERD (gastroesophageal reflux disease)    Hyperlipidemia    Hypertension     Past Surgical History:  Procedure Laterality Date   BREAST BIOPSY Right 04/19/2015   benign   BREAST BIOPSY Right 11/01/2020   fibroadenoma   CATARACT EXTRACTION     CHOLECYSTECTOMY     COLONOSCOPY     GALLBLADDER SURGERY     IR ANGIO INTRA EXTRACRAN SEL COM CAROTID INNOMINATE UNI R MOD SED  05/16/2022   IR ANGIO VERTEBRAL SEL VERTEBRAL UNI L MOD SED  05/16/2022   IR CT HEAD LTD  05/16/2022   IR CT HEAD LTD  05/16/2022   IR CT HEAD LTD  05/16/2022   IR INTRAVSC  STENT CERV CAROTID W/O EMB-PROT MOD SED INC ANGIO  05/16/2022   IR PERCUTANEOUS ART THROMBECTOMY/INFUSION INTRACRANIAL INC DIAG ANGIO  05/16/2022   RADIOLOGY WITH ANESTHESIA N/A 05/15/2022   Procedure: IR WITH ANESTHESIA;  Surgeon: Radiologist, Medication, MD;  Location: [facility] OR;  Service: Radiology;  Laterality: N/A;   UTERINE FIBROID SURGERY       reports that she has been smoking cigarettes. She has a 34 pack-year smoking history. She has been exposed to tobacco smoke. She has never used smokeless tobacco. She reports current alcohol use. She reports that she does not use drugs.  Allergies[1]  Family History  Problem Relation Age of Onset   Diabetes Mother    Stroke Mother    Dementia Mother    Heart disease Father    Diabetes Father    Diabetes Brother    Dementia Brother    Renal Disease Brother    Diabetes Sister    Narcolepsy Sister    Arthritis Sister    Diabetes Sister    Obesity Sister    Diabetes Brother    Heart disease Brother    Cancer Brother        pancreatic   Diabetes Brother    Diabetes Brother    Stomach cancer Sister    Colon cancer Neg Hx    Esophageal cancer Neg Hx    Rectal cancer  Neg Hx     Prior to Admission medications  Medication Sig Start Date End Date Taking? Authorizing Provider  acetaminophen  (TYLENOL ) 325 MG tablet Take 2 tablets (650 mg total) by mouth every 6 (six) hours as needed (pain). Patient not taking: Reported on 06/12/2024 09/10/23   Danton Reyes DASEN, MD  alendronate  (FOSAMAX ) 70 MG tablet Take 1 tablet (70 mg total) by mouth every 7 (seven) days. Take with a full glass of water  on an empty stomach. 10/25/23   Danton Reyes DASEN, MD  amLODipine -atorvastatin  (CADUET ) 5-40 MG tablet Take 1 tablet by mouth daily.    [provider]  cephALEXin  (KEFLEX ) 500 MG capsule Take 1 capsule (500 mg total) by mouth 3 (three) times daily. 7 day therapy 06/15/24   Singh, Prashant K, MD  cholecalciferol  (VITAMIN D3) 25 MCG (1000 UNIT) tablet  Take 1,000 Units by mouth daily.    [provider]  feeding supplement, GLUCERNA SHAKE, (GLUCERNA SHAKE) LIQD Take 120 mLs by mouth as needed (hypoglycemia).    [provider]  ferrous sulfate  325 (65 FE) MG EC tablet Take 1 tablet (325 mg total) by mouth daily with breakfast. Patient taking differently: Take 325 mg by mouth 3 (three) times daily with meals. 09/10/23   Danton Reyes DASEN, MD  insulin  lispro (HUMALOG) 100 UNIT/ML injection Inject 0-10 Units into the skin 3 (three) times daily before meals. If BG is 71-150 = 0 units, 151-200 = 2 units, 201-250 = 4 units, 251-300 = 6 units, 301-350 = 8 units, 351-400 = 10 units. Notify MD if < 70 or > 400.    [provider]  LANTUS  100 UNIT/ML injection Inject 0.2 mLs (20 Units total) into the skin daily. 06/15/24   Singh, Prashant K, MD  levETIRAcetam  (KEPPRA ) 750 MG tablet Take 1 tablet (750 mg total) by mouth 2 (two) times daily. 06/15/24   Singh, Prashant K, MD  senna (SENOKOT) 8.6 MG TABS tablet Take 1 tablet by mouth every other day.    [provider]  sertraline  (ZOLOFT ) 25 MG tablet Take 75 mg by mouth daily. 08/21/23   [provider]    Physical Exam: Constitutional: Moderately built and nourished. Vitals:   09/17/24 1612 09/17/24 1613 09/17/24 1819 09/17/24 2006  BP: (!) 164/76   (!) 130/58  Pulse: (!) 112  93 90  Resp: 14  13 16   Temp: 98.6 F (37 C)   98.1 F (36.7 C)  TempSrc: Oral   Oral  SpO2: 99%  100% 100%  Weight:  79 kg    Height:  5' 5 (1.651 m)     Eyes: Anicteric no pallor. ENMT: No discharge from the ears eyes nose or mouth. Neck: No mass felt.  No neck rigidity. Respiratory: No rhonchi or crepitations. Cardiovascular: S1-S2 heard. Abdomen: Soft nontender bowel sound present. Musculoskeletal: No edema. Skin: No rash. Neurologic: Alert awake but nonverbal.  Has right-sided hemiplegia from previous stroke. Psychiatric: Nonverbal.   Labs on Admission: I have  personally reviewed following labs and imaging studies  CBC: Recent Labs  Lab 09/17/24 1639  WBC 9.9  HGB 12.0  HCT 37.9  MCV 72.6*  PLT 344   Basic Metabolic Panel: Recent Labs  Lab 09/17/24 1639  NA 138  K 3.7  CL 105  CO2 22  GLUCOSE 129*  BUN 11  CREATININE 0.55  CALCIUM  9.6   GFR: Estimated Creatinine Clearance: 68.9 mL/min (by C-G formula based on SCr of 0.55 mg/dL). Liver Function Tests:  Recent Labs  Lab 09/17/24 1639  AST 26  ALT 31  ALKPHOS 135*  BILITOT 0.2  PROT 7.7  ALBUMIN 3.9   No results for input(s): LIPASE, AMYLASE in the last 168 hours. No results for input(s): AMMONIA in the last 168 hours. Coagulation Profile: No results for input(s): INR, PROTIME in the last 168 hours. Cardiac Enzymes: No results for input(s): CKTOTAL, CKMB, CKMBINDEX, TROPONINI in the last 168 hours. BNP (last 3 results) No results for input(s): PROBNP in the last 8760 hours. HbA1C: No results for input(s): HGBA1C in the last 72 hours. CBG: No results for input(s): GLUCAP in the last 168 hours. Lipid Profile: No results for input(s): CHOL, HDL, LDLCALC, TRIG, CHOLHDL, LDLDIRECT in the last 72 hours. Thyroid  Function Tests: No results for input(s): TSH, T4TOTAL, FREET4, T3FREE, THYROIDAB in the last 72 hours. Anemia Panel: No results for input(s): VITAMINB12, FOLATE, FERRITIN, TIBC, IRON , RETICCTPCT in the last 72 hours. Urine analysis:    Component Value Date/Time   COLORURINE YELLOW 09/17/2024 2000   APPEARANCEUR TURBID (A) 09/17/2024 2000   LABSPEC 1.010 09/17/2024 2000   PHURINE 8.0 09/17/2024 2000   GLUCOSEU NEGATIVE 09/17/2024 2000   HGBUR SMALL (A) 09/17/2024 2000   BILIRUBINUR NEGATIVE 09/17/2024 2000   BILIRUBINUR Negative 04/23/2022 0953   KETONESUR NEGATIVE 09/17/2024 2000   PROTEINUR 100 (A) 09/17/2024 2000   UROBILINOGEN 0.2 04/23/2022 0953   UROBILINOGEN 0.2 02/05/2015 0322   NITRITE  NEGATIVE 09/17/2024 2000   LEUKOCYTESUR LARGE (A) 09/17/2024 2000   Sepsis Labs: @LABRCNTIP (procalcitonin:4,lacticidven:4) )No results found for this or any previous visit (from the past 240 hours).   Radiological Exams on Admission: CT Head Wo Contrast Result Date: 09/17/2024 EXAM: CT HEAD WITHOUT CONTRAST 09/17/2024 06:09:25 PM TECHNIQUE: CT of the head was performed without the administration of intravenous contrast. Automated exposure control, iterative reconstruction, and/or weight based adjustment of the mA/kV was utilized to reduce the radiation dose to as low as reasonably achievable. COMPARISON: 06/12/2024 CLINICAL HISTORY: Mental status change, unknown cause FINDINGS: BRAIN AND VENTRICLES: No acute hemorrhage. No evidence of acute infarct. Remote left MCA territory infarct. Stable generalized atrophy. Ex vacuo dilation of left lateral ventricle. No hydrocephalus. No extra-axial collection. No mass effect or midline shift. Atherosclerotic calcifications are present within the cavernous internal carotid arteries. ORBITS: Right lens replacement. No acute abnormality. SINUSES: No acute abnormality. SOFT TISSUES AND SKULL: No acute soft tissue abnormality. No skull fracture. IMPRESSION: 1. No acute intracranial abnormality. Electronically signed by: Morgane Naveau MD 09/17/2024 06:43 PM EST RP Workstation: [id number]    EKG: Independently reviewed.  Sinus tachycardia.  Assessment/Plan Principal Problem:   Unresponsive episode    Breakthrough seizures -     patient has known history of seizures and patient symptoms are concerning for breakthrough seizures.  Discussed with Dr. Vanessa on-call neurologist who recommended giving a total of 1500 mg IV Keppra  loading dose and increasing her regular Keppra  dose from 750 mg to 1000 mg twice daily. UTI with recent cultures in September 2025 growing ESBL will keep patient on meropenem . History of stroke with right-sided hemiplegia and nonverbal  at baseline on statins. Diabetes mellitus type 2 takes Lantus  20 units in the morning along with sliding scale coverage.  Last hemoglobin A1c was 7.3 about 3 months ago. Hypertension on amlodipine .  DVT prophylaxis: Xarelto . Code Status: Full code. Family Communication: Patient's son. Disposition Plan: Monitor bed. Consults called: Discussed with neurologist. Admission status: Observation.         [1]  Allergies Allergen Reactions   Bovine (Beef) Protein-Containing Drug Products Other (See Comments)    Unknown reaction   Porcine (Pork) Protein-Containing Drug Products Other (See Comments)    Unknown reaction   "

## 2024-09-17 NOTE — ED Provider Triage Note (Signed)
 Emergency Medicine Provider Triage Evaluation Note  Bailey [name] , a 68 y.o. female  was evaluated in triage.  Pt complains of altered mental status. Pt reported to have altered mental status today at facility. No report of trauma/fall. No report of fever. Pt is a very limited historian - level 5 caveat.  Review of Systems  Positive: Altered ms Negative: fever  Physical Exam  BP (!) 164/76 (BP Location: Right Arm)   Pulse (!) 112   Temp 98.6 F (37 C) (Oral)   Resp 14   Ht 1.651 m (5' 5)   Wt 79 kg   SpO2 99%   BMI 28.98 kg/m  Gen:   Awake, no distress   Resp:  Normal effort cta bil. Neuro:  Right hemiparesis (prior cva)   Medical Decision Making  Medically screening exam initiated at 4:38 PM.  Appropriate orders placed.  Bailey [name] was informed that the remainder of the evaluation will be completed by another provider, this initial triage assessment does not replace that evaluation, and the importance of remaining in the ED until their evaluation is complete.  Labs and imaging ordered.    Bailey Drivers, MD 09/17/24 1640

## 2024-09-17 NOTE — ED Triage Notes (Signed)
 Patient arrives via [facility] from [facility] for AMS. Patient found laying in bed unresponsive, eyes to right. Patient at baseline with EMS. Right side flaccid from previous stroke. Patient follows commands. Hx of seizures. Keppra  and is compliant. No seizure activity witnessed. No falls or trauma. Unknown LKW. 18 LFA.   EMS vitals 172/100 HR 90 98 on room air CBG 136  12 lead clean.

## 2024-09-17 NOTE — ED Provider Notes (Signed)
 " [facility] Provider Note   HPI/ROS    History obtained from EMR.  Bailey [name] is a 68 y.o. female who presents for Altered Mental Status and who  has a past medical history of Arthritis, Cataract, COPD (chronic obstructive pulmonary disease) (HCC), Diabetes mellitus without complication (HCC), Fibrocystic breast disease (July 2016), GERD (gastroesophageal reflux disease), Hyperlipidemia, and Hypertension.  Patient presents today via EMS for concerns for altered mental status from her facility.  She was found laying in bed unresponsive with her eyes deviated to the right.  Has right sided flaccidity from prior stroke.  Also has a history of seizures and is compliant with her Keppra  per EMS.  When patient asked questions, only answers  so-so.  Unable to obtain further history from patient given baseline mental status.  MDM   I have reviewed the nursing documentation, vital signs, as well as the past medical history, surgical history, family history, and social history.  Initial Assessment:  Very difficult to ascertain anything for the patient at this time as she only responds so-so to questioning.  Still has persistent right-sided hemibody paresis.  Initially sounds like she had some right-sided gaze deviation, but her eyes now cross midline.  Does not wince in pain to palpation over the abdomen or chest.  Unclear with this episode of decreased responsiveness while this.  Will obtain basic labs as well as UA and CT head.  Will obtain EKG.  CBC here with no significant leukocytosis or anemia.  No significant thrombocytopenia.  CMP with no significant electrolyte abnormality, AKI, or transaminitis.  Mild elevated alk phos.  EKG here sinus tachycardia.  Otherwise no obvious ischemia, dysrhythmia, or high-grade AV block.  No pathologic T wave inversions.  Nonspecific T wave changes.  CT head here with no acute intracranial abnormality.  Agree with  radiology.  UA here with evidence of UTI.  Has had ESBL in the past, so patient will need admission for this.  Patient given a dose of meropenem  here in the ED.  Will touch base with neurology given patient had possible seizure today, although likely etiology is infection lowering the seizure threshold as she is compliant with her Keppra  per the facility.  Spoke with medicine  regarding admission and they agree.  Given history of ESBL and concern that she had a seizure today likely secondary to infection they plan for admission for further workup and management.  Patient admitted to Dr.Kakrakandy.  Please see inpatient provider note for further details of care.  Disposition:  I discussed the case with [facility] who graciously agreed to admit the patient to their service for continued care.   This patient was staffed with Dr. Francesca who supervised the visit and agreed with the plan of care.  Due to the patients current presenting symptoms, physical exam findings, and the workup stated above, it is thought that the etiology of the patients current presentation is:  1. Transient alteration of awareness   2. Acute cystitis without hematuria    Clinical Complexity A medically appropriate history, review of systems, and physical exam was performed.  Factors that affect the complexity of this encounter: assessment of correct protocol, laboratory work from this visit, notes from other physicians (Neurology), and review of echocardiogram/EKG results  My independent interpretations of diagnostic studies are documented in the ED course above.   If decision rules were used in this patient's evaluation, they are listed below.   Click here for ABCD2,  HEART and other calculators  Patient's presentation is most consistent with acute presentation with potential threat to life or bodily function.  MDM generated using voice dictation software and may contain dictation errors. Please contact me for any  clarification or with any questions.    Physical Exam, PMH, PSH, Family History, and Social Hsitory   Vitals:   09/17/24 1612 09/17/24 1613 09/17/24 1819 09/17/24 2006  BP: (!) 164/76   (!) 130/58  Pulse: (!) 112  93 90  Resp: 14  13 16   Temp: 98.6 F (37 C)   98.1 F (36.7 C)  TempSrc: Oral   Oral  SpO2: 99%  100% 100%  Weight:  79 kg    Height:  5' 5 (1.651 m)      Physical Exam Constitutional:      Appearance: She is not toxic-appearing or diaphoretic.  HENT:     Head: Normocephalic and atraumatic.  Eyes:     Conjunctiva/sclera: Conjunctivae normal.     Pupils: Pupils are equal, round, and reactive to light.     Comments: Difficult to assess extraocular motion, but both eyes cross midline bidirectionally  Cardiovascular:     Rate and Rhythm: Normal rate and regular rhythm.  Pulmonary:     Effort: Pulmonary effort is normal.     Breath sounds: Normal breath sounds. No wheezing or rales.  Abdominal:     General: There is no distension.     Palpations: Abdomen is soft.     Hernia: No hernia is present.  Skin:    General: Skin is warm and dry.     Capillary Refill: Capillary refill takes less than 2 seconds.  Neurological:     Mental Status: She is alert.     GCS: GCS eye subscore is 4. GCS verbal subscore is 3. GCS motor subscore is 5.     Comments: Right-sided hemibody weakness.  Contracted right upper extremity.  Only responsive so to questioning, patient baseline.     Past Medical History:  Diagnosis Date   Arthritis    Cataract    COPD (chronic obstructive pulmonary disease) (HCC)    Diabetes mellitus without complication (HCC)    Fibrocystic breast disease July 2016   GERD (gastroesophageal reflux disease)    Hyperlipidemia    Hypertension      Past Surgical History:  Procedure Laterality Date   BREAST BIOPSY Right 04/19/2015   benign   BREAST BIOPSY Right 11/01/2020   fibroadenoma   CATARACT EXTRACTION     CHOLECYSTECTOMY     COLONOSCOPY      GALLBLADDER SURGERY     IR ANGIO INTRA EXTRACRAN SEL COM CAROTID INNOMINATE UNI R MOD SED  05/16/2022   IR ANGIO VERTEBRAL SEL VERTEBRAL UNI L MOD SED  05/16/2022   IR CT HEAD LTD  05/16/2022   IR CT HEAD LTD  05/16/2022   IR CT HEAD LTD  05/16/2022   IR INTRAVSC STENT CERV CAROTID W/O EMB-PROT MOD SED INC ANGIO  05/16/2022   IR PERCUTANEOUS ART THROMBECTOMY/INFUSION INTRACRANIAL INC DIAG ANGIO  05/16/2022   RADIOLOGY WITH ANESTHESIA N/A 05/15/2022   Procedure: IR WITH ANESTHESIA;  Surgeon: Radiologist, Medication, MD;  Location: [facility] OR;  Service: Radiology;  Laterality: N/A;   UTERINE FIBROID SURGERY       Family History  Problem Relation Age of Onset   Diabetes Mother    Stroke Mother    Dementia Mother    Heart disease Father    Diabetes Father  Diabetes Brother    Dementia Brother    Renal Disease Brother    Diabetes Sister    Narcolepsy Sister    Arthritis Sister    Diabetes Sister    Obesity Sister    Diabetes Brother    Heart disease Brother    Cancer Brother        pancreatic   Diabetes Brother    Diabetes Brother    Stomach cancer Sister    Colon cancer Neg Hx    Esophageal cancer Neg Hx    Rectal cancer Neg Hx     Social History   Tobacco Use   Smoking status: Every Day    Current packs/day: 1.00    Average packs/day: 1 pack/day for 34.0 years (34.0 ttl pk-yrs)    Types: Cigarettes    Passive exposure: Current   Smokeless tobacco: Never   Tobacco comments:    Tobacco info given 10/11/15  Substance Use Topics   Alcohol use: Yes    Comment: socially     Procedures   If procedures were preformed on this patient, they are listed below:  Procedures   Electronically signed by:   Glendia Carlin Ancona, M.D. PGY-2, Emergency Medicine   Please note that this documentation was produced with the assistance of voice-to-text technology and may contain errors.    Ancona Glendia, MD 09/17/24 [place]    Francesca Elsie CROME, MD 09/17/24 [phone]  "

## 2024-09-18 ENCOUNTER — Observation Stay (HOSPITAL_COMMUNITY)

## 2024-09-18 ENCOUNTER — Other Ambulatory Visit: Payer: Self-pay

## 2024-09-18 ENCOUNTER — Telehealth (HOSPITAL_COMMUNITY): Payer: Self-pay

## 2024-09-18 ENCOUNTER — Other Ambulatory Visit (HOSPITAL_COMMUNITY): Payer: Self-pay

## 2024-09-18 DIAGNOSIS — R4182 Altered mental status, unspecified: Secondary | ICD-10-CM | POA: Diagnosis not present

## 2024-09-18 DIAGNOSIS — R569 Unspecified convulsions: Secondary | ICD-10-CM

## 2024-09-18 DIAGNOSIS — G40919 Epilepsy, unspecified, intractable, without status epilepticus: Secondary | ICD-10-CM | POA: Diagnosis not present

## 2024-09-18 LAB — BASIC METABOLIC PANEL WITH GFR
Anion gap: 9 (ref 5–15)
BUN: 9 mg/dL (ref 8–23)
CO2: 26 mmol/L (ref 22–32)
Calcium: 9.4 mg/dL (ref 8.9–10.3)
Chloride: 107 mmol/L (ref 98–111)
Creatinine, Ser: 0.53 mg/dL (ref 0.44–1.00)
GFR, Estimated: >60 mL/min
Glucose, Bld: 173 mg/dL — ABNORMAL HIGH (ref 70–99)
Potassium: 3.8 mmol/L (ref 3.5–5.1)
Sodium: 141 mmol/L (ref 135–145)

## 2024-09-18 LAB — CBC
HCT: 34.8 % — ABNORMAL LOW (ref 36.0–46.0)
Hemoglobin: 11 g/dL — ABNORMAL LOW (ref 12.0–15.0)
MCH: 22.8 pg — ABNORMAL LOW (ref 26.0–34.0)
MCHC: 31.6 g/dL (ref 30.0–36.0)
MCV: 72.2 fL — ABNORMAL LOW (ref 80.0–100.0)
Platelets: 294 K/uL (ref 150–400)
RBC: 4.82 MIL/uL (ref 3.87–5.11)
RDW: 16.5 % — ABNORMAL HIGH (ref 11.5–15.5)
WBC: 10.8 K/uL — ABNORMAL HIGH (ref 4.0–10.5)
nRBC: 0 % (ref 0.0–0.2)

## 2024-09-18 LAB — CBG MONITORING, ED
Glucose-Capillary: 172 mg/dL — ABNORMAL HIGH (ref 70–99)
Glucose-Capillary: 149 mg/dL — ABNORMAL HIGH (ref 70–99)
Glucose-Capillary: 189 mg/dL — ABNORMAL HIGH (ref 70–99)

## 2024-09-18 LAB — MAGNESIUM: Magnesium: 1.7 mg/dL (ref 1.7–2.4)

## 2024-09-18 MED ORDER — SERTRALINE HCL 50 MG PO TABS
75.0000 mg | ORAL_TABLET | Freq: Every day | ORAL | Status: DC
  Administered 2024-09-18 – 2024-09-19 (×2): 75 mg via ORAL
  Filled 2024-09-18 (×2): qty 2

## 2024-09-18 MED ORDER — SODIUM CHLORIDE 0.9 % IV SOLN
1.0000 g | Freq: Three times a day (TID) | INTRAVENOUS | Status: DC
  Administered 2024-09-18: 1 g via INTRAVENOUS
  Filled 2024-09-18: qty 20

## 2024-09-18 NOTE — Progress Notes (Signed)
 Routine EEG complete. Results pending.

## 2024-09-18 NOTE — Care Management Obs Status (Signed)
 MEDICARE OBSERVATION STATUS NOTIFICATION   Patient Details  Name: Bailey [name] MRN: [id number] Date of Birth: 08/11/1956   Medicare Observation Status Notification Given:  Yes    Debarah Saunas, RN 09/18/2024, 2:33 PM

## 2024-09-18 NOTE — Progress Notes (Signed)
 Transition of Care [facility]) - Inpatient Brief Assessment   Patient Details  Name: Bailey [name] MRN: [id number] Date of Birth: Oct 20, 1955  Transition of Care [facility]) CM/SW Contact:    Debarah Saunas, RN Phone Number: 09/18/2024, 2:40 PM   Clinical Narrative: MOON letter signed and given.  RNCM attempted assessment, pt only answers yes/no and everything else is su-su. ICM will continue to follow for disposition needs and assist with pt returning to [facility].    Transition of Care Asessment: Insurance and Status: Insurance coverage has been reviewed Patient has primary care physician: Yes Home environment has been reviewed: from [facility] Prior level of function:: (P) limited assistance feeding, dressing/ max assistance with bathing and bowel (incontinent) Prior/Current Home Services: (P) No current home services Social Drivers of Health Review: (P) SDOH reviewed no interventions necessary Readmission risk has been reviewed: (P) Yes Transition of care needs: (P) transition of care needs identified, TOC will continue to follow

## 2024-09-18 NOTE — ED Notes (Signed)
 [facility] contacted to confirm if the lunch tray had left the kitchen. Per [facility], the tray is out for delivery.

## 2024-09-18 NOTE — Procedures (Signed)
 Patient Name: Bailey [name]  MRN: [id number]  Epilepsy Attending: Arlin MALVA Krebs  Referring Physician/Provider: Perri DELENA Meliton Mickey., MD  Date: 09/18/2024 Duration: 25.22 mins  Patient history: 68yo F with ams. EEG to evaluate for seizure  Level of alertness: Awake  AEDs during EEG study: LEV  Technical aspects: This EEG study was done with scalp electrodes positioned according to the 10-20 International system of electrode placement. Electrical activity was reviewed with band pass filter of 1-70Hz , sensitivity of 7 uV/mm, display speed of 1mm/sec with a 60Hz  notched filter applied as appropriate. EEG data were recorded continuously and digitally stored.  Video monitoring was available and reviewed as appropriate.  Description:  The posterior dominant rhythm consists of 9 Hz activity of moderate voltage (25-35 uV) seen predominantly in posterior head regions, asymmetric ( left<right) and reactive to eye opening and eye closing. Sleep was characterized by vertex waves, sleep spindles (12 to 14 Hz), maximal frontocentral region. EEG showed continuous 3 to 5 Hz theta-delta slowing in left hemisphere. Hyperventilation and photic stimulation were not performed.      ABNORMALITY - Continuous slow,  left hemisphere   IMPRESSION: This study is suggestive of cortical dysfunction arising from left hemisphere likely secondary to underlying stroke. No seizures or epileptiform discharges were seen throughout the recording.   Bailey [name]

## 2024-09-18 NOTE — Progress Notes (Signed)
 " PROGRESS NOTE    Bailey [name]  FMW:[id number] DOB: Apr 30, 1956 DOA: 09/17/2024 PCP: Richrd Randine DEL, MD  Chief Complaint  Patient presents with   Altered Mental Status    Brief Narrative:   Bailey [name] is Bailey [name] 68 y.o. female with history of stroke with right-sided hemiplegia and nonverbal at baseline, seizures, hypertension, diabetes mellitus type 2 was brought to the ER after patient was found to be unresponsive for at least 10 minutes at the living facility.    She was admitted with concern for breakthrough seizures.    Assessment & Plan:   Principal Problem:   Breakthrough seizure (HCC) Active Problems:   Type 2 diabetes mellitus treated without insulin  (HCC)   Essential hypertension   History of CVA (cerebrovascular accident)   Unresponsive episode  Breakthrough seizures  Case was discussed with Dr. Vanessa at presentation who recommended 1500 mg IV keppra  loading dose and increasing maintenance dose to 1000 mg BID EEG Head CT without acute intracranial abnormality  Pyuria   UA is contaminated with squams, will d/c abx Afebrile and mild leukocytosis on presentation, will monitor for now Will need repeat UA/culture if symptoms concerning for UTI  History of stroke with right-sided hemiplegia and nonverbal at baseline on statins.  Diabetes mellitus type 2 takes Lantus  20 units in the morning along with sliding scale coverage.  Last hemoglobin A1c was 7.3 about 3 months ago.  Hypertension on amlodipine   Depression  Anxiety Zoloft     DVT prophylaxis: xarel;to Code Status: full Family Communication: none Disposition:   Status is: Observation The patient remains OBS appropriate and will d/c before 2 midnights.   Consultants:  none  Procedures:  none  Antimicrobials:  Anti-infectives (From admission, onward)    Start     Dose/Rate Route Frequency Ordered Stop   09/18/24 0600  meropenem  (MERREM ) 1 g in sodium chloride  0.9 % 100 mL IVPB         1 g 200 mL/hr over 30 Minutes Intravenous Every 8 hours 09/18/24 0412     09/17/24 2030  meropenem  (MERREM ) 1 g in sodium chloride  0.9 % 100 mL IVPB        1 g 200 mL/hr over 30 Minutes Intravenous  Once 09/17/24 2025 09/17/24 2107       Subjective: Expressive aphasia  Objective: Vitals:   09/18/24 0600 09/18/24 0633 09/18/24 0700 09/18/24 0830  BP: (!) 122/55  (!) 127/52 (!) 136/49  Pulse: 80  84 88  Resp: 11  12 17   Temp:  98.1 F (36.7 C)    TempSrc:  Oral    SpO2: 100%  99% 100%  Weight:      Height:        Intake/Output Summary (Last 24 hours) at 09/18/2024 0849 Last data filed at 09/18/2024 9384 Gross per 24 hour  Intake 196.62 ml  Output --  Net 196.62 ml   Filed Weights   09/17/24 1613  Weight: 79 kg    Examination:  General exam: Appears calm and comfortable - eating breakfast Respiratory system: unlabored Cardiovascular system: RRR Central nervous system: expressive aphasia, able to follow instructions - R sided weakness Extremities: no LEE   Data Reviewed: I have personally reviewed following labs and imaging studies  CBC: Recent Labs  Lab 09/17/24 1639 09/18/24 0556  WBC 9.9 10.8*  HGB 12.0 11.0*  HCT 37.9 34.8*  MCV 72.6* 72.2*  PLT 344 294    Basic Metabolic Panel: Recent Labs  Lab 09/17/24 1639  09/18/24 0556  NA 138 141  K 3.7 3.8  CL 105 107  CO2 22 26  GLUCOSE 129* 173*  BUN 11 9  CREATININE 0.55 0.53  CALCIUM  9.6 9.4  MG  --  1.7    GFR: Estimated Creatinine Clearance: 68.9 mL/min (by C-G formula based on SCr of 0.53 mg/dL).  Liver Function Tests: Recent Labs  Lab 09/17/24 1639  AST 26  ALT 31  ALKPHOS 135*  BILITOT 0.2  PROT 7.7  ALBUMIN 3.9    CBG: Recent Labs  Lab 09/18/24 0814  GLUCAP 172*     No results found for this or any previous visit (from the past 240 hours).       Radiology Studies: CT Head Wo Contrast Result Date: 09/17/2024 EXAM: CT HEAD WITHOUT CONTRAST 09/17/2024 06:09:25  PM TECHNIQUE: CT of the head was performed without the administration of intravenous contrast. Automated exposure control, iterative reconstruction, and/or weight based adjustment of the mA/kV was utilized to reduce the radiation dose to as low as reasonably achievable. COMPARISON: 06/12/2024 CLINICAL HISTORY: Mental status change, unknown cause FINDINGS: BRAIN AND VENTRICLES: No acute hemorrhage. No evidence of acute infarct. Remote left MCA territory infarct. Stable generalized atrophy. Ex vacuo dilation of left lateral ventricle. No hydrocephalus. No extra-axial collection. No mass effect or midline shift. Atherosclerotic calcifications are present within the cavernous internal carotid arteries. ORBITS: Right lens replacement. No acute abnormality. SINUSES: No acute abnormality. SOFT TISSUES AND SKULL: No acute soft tissue abnormality. No skull fracture. IMPRESSION: 1. No acute intracranial abnormality. Electronically signed by: Morgane Naveau MD 09/17/2024 06:43 PM EST RP Workstation: [id number]        Scheduled Meds:  amLODipine   5 mg Oral Daily   And   atorvastatin   40 mg Oral Daily   insulin  aspart  0-9 Units Subcutaneous TID WC   insulin  glargine  20 Units Subcutaneous Daily   levETIRAcetam   1,000 mg Oral BID   rivaroxaban   10 mg Oral Daily   senna  1 tablet Oral QODAY   Continuous Infusions:  meropenem  (MERREM ) IV Stopped (09/18/24 0615)     LOS: 0 days    Time spent: over 30 min    Meliton Monte, MD [facility]   To contact the attending provider between 7A-7P or the covering provider during after hours 7P-7A, please log into the web site [email or web address] and access using universal [place] password for that web site. If you do not have the password, please call the hospital operator.  09/18/2024, 8:49 AM    "

## 2024-09-18 NOTE — ED Notes (Signed)
 CCMD notified this RN of pt having Vtach cardiac rhythm lasting 7 beats. EDT at bedside at time changing pt's brief. Attending provider made aware via Epic chat.

## 2024-09-18 NOTE — ED Notes (Signed)
 Urine sample was not collected, accidentally clicked off.

## 2024-09-18 NOTE — Telephone Encounter (Signed)
 Pharmacy Patient Advocate Encounter  Insurance verification completed.    The patient is insured through [employer]. Patient has Medicare and is not eligible for a copay card, but may be able to apply for patient assistance or Medicare RX Payment Plan (Patient Must reach out to their plan, if eligible for payment plan), if available.    Ran test claim for Xarelto  20mg  tablet and the current 30 day co-pay is $0.   This test claim was processed through [employer]- copay amounts may vary at other pharmacies due to [employer], or as the patient moves through the different stages of their insurance plan.

## 2024-09-19 ENCOUNTER — Encounter (HOSPITAL_COMMUNITY): Payer: Self-pay | Admitting: Internal Medicine

## 2024-09-19 ENCOUNTER — Other Ambulatory Visit: Payer: Self-pay

## 2024-09-19 DIAGNOSIS — G40919 Epilepsy, unspecified, intractable, without status epilepticus: Secondary | ICD-10-CM | POA: Diagnosis not present

## 2024-09-19 LAB — URINALYSIS, W/ REFLEX TO CULTURE (INFECTION SUSPECTED)
Bilirubin Urine: NEGATIVE
Glucose, UA: NEGATIVE mg/dL
Ketones, ur: NEGATIVE mg/dL
Nitrite: NEGATIVE
Protein, ur: NEGATIVE mg/dL
Specific Gravity, Urine: 1.01 (ref 1.005–1.030)
WBC, UA: >50 WBC/hpf (ref 0–5)
pH: 5 (ref 5.0–8.0)

## 2024-09-19 LAB — CBC WITH DIFFERENTIAL/PLATELET
Abs Immature Granulocytes: 0.04 K/uL (ref 0.00–0.07)
Basophils Absolute: 0 K/uL (ref 0.0–0.1)
Basophils Relative: 0 %
Eosinophils Absolute: 0.3 K/uL (ref 0.0–0.5)
Eosinophils Relative: 3 %
HCT: 35.4 % — ABNORMAL LOW (ref 36.0–46.0)
Hemoglobin: 11.6 g/dL — ABNORMAL LOW (ref 12.0–15.0)
Immature Granulocytes: 0 %
Lymphocytes Relative: 32 %
Lymphs Abs: 3.2 K/uL (ref 0.7–4.0)
MCH: 23 pg — ABNORMAL LOW (ref 26.0–34.0)
MCHC: 32.8 g/dL (ref 30.0–36.0)
MCV: 70.2 fL — ABNORMAL LOW (ref 80.0–100.0)
Monocytes Absolute: 0.8 K/uL (ref 0.1–1.0)
Monocytes Relative: 8 %
Neutro Abs: 5.7 K/uL (ref 1.7–7.7)
Neutrophils Relative %: 57 %
Platelets: 320 K/uL (ref 150–400)
RBC: 5.04 MIL/uL (ref 3.87–5.11)
RDW: 16.7 % — ABNORMAL HIGH (ref 11.5–15.5)
WBC: 10 K/uL (ref 4.0–10.5)
nRBC: 0 % (ref 0.0–0.2)

## 2024-09-19 LAB — GLUCOSE, CAPILLARY
Glucose-Capillary: 170 mg/dL — ABNORMAL HIGH (ref 70–99)
Glucose-Capillary: 181 mg/dL — ABNORMAL HIGH (ref 70–99)
Glucose-Capillary: 194 mg/dL — ABNORMAL HIGH (ref 70–99)
Glucose-Capillary: 227 mg/dL — ABNORMAL HIGH (ref 70–99)

## 2024-09-19 MED ORDER — LEVETIRACETAM 1000 MG PO TABS: 1000.0000 mg | ORAL_TABLET | Freq: Two times a day (BID) | ORAL | Status: AC

## 2024-09-19 NOTE — Progress Notes (Signed)
 Report given to staff/nurse at [facility]. Awaiting for transport to disposition.

## 2024-09-19 NOTE — Progress Notes (Signed)
 Received pt from ED, nonverbal at baseline, with right sided Hemiplegia, alert, only answers su-su on questions but will follows commands, hooked to tele monitoring, kept call bell within reach.

## 2024-09-19 NOTE — Plan of Care (Signed)
  Problem: Education: Goal: Ability to describe self-care measures that may prevent or decrease complications (Diabetes Survival Skills Education) will improve Outcome: Adequate for Discharge Goal: Individualized Educational Video(s) Outcome: Adequate for Discharge   Problem: Coping: Goal: Ability to adjust to condition or change in health will improve Outcome: Adequate for Discharge   

## 2024-09-19 NOTE — Discharge Summary (Signed)
 Physician Discharge Summary  Bailey [name] FMW:[id number] DOB: 1956-06-18 DOA: 09/17/2024  PCP: Richrd Randine DEL, MD  Admit date: 09/17/2024 Discharge date: 09/19/2024  Time spent: 40 minutes  Recommendations for Outpatient Follow-up:  Follow outpatient CBC/CMP Follow with neurology outpatient Seizure precautions UA did not support UTI, abx discontinued here - consider repeat UA/culture if signs or symptoms of UTI outpatient   Discharge Diagnoses:  Principal Problem:   Breakthrough seizure (HCC) Active Problems:   Type 2 diabetes mellitus treated without insulin  (HCC)   Essential hypertension   History of CVA (cerebrovascular accident)   Unresponsive episode   Discharge Condition: stable  Diet recommendation: heart healthy, diabetic  Filed Weights   09/17/24 1613  Weight: 79 kg    History of present illness:   Bailey [name] is Bailey [name] 68 y.o. female with history of stroke with right-sided hemiplegia and nonverbal at baseline, seizures, hypertension, diabetes mellitus type 2 was brought to the ER after patient was found to be unresponsive for at least 10 minutes at the living facility.     She was admitted with concern for breakthrough seizures.    Her keppra  dose has been increased.  She's stable for discharge on 12/30.  See below for additional details.    Hospital Course:  Assessment and Plan:  Breakthrough seizures  Case was discussed with Dr. Vanessa at presentation who recommended 1500 mg IV keppra  loading dose and increasing maintenance dose to 1000 mg BID EEG - no seizures or epileptiform discharges, cortical dysfunction arising from L hemisphere (similar to 2024 EEG) Head CT without acute intracranial abnormality Discharge on 1000 mg keppra  BID  Per [employer]  DMV statutes, patients with seizures are not allowed to drive until  they have been seizure-free for six months. Use caution when using heavy equipment or power tools. Avoid working on ladders  or at heights. Take showers instead of baths. Ensure the water  temperature is not too high on the home water  heater. Do not go swimming alone. When caring for infants or small children, sit down when holding, feeding, or changing them to minimize risk of injury to the child in the event you have Arjun Hard seizure. Also, Maintain good sleep hygiene. Avoid alcohol.   Pyuria              UA is contaminated with squams, will d/c abx Afebrile and mild leukocytosis on presentation, will monitor for now Repeat UA with squams again Low suspicion for true UTI   History of stroke with right-sided hemiplegia and nonverbal at baseline on statins.   Diabetes mellitus type 2 takes Lantus  20 units in the morning along with sliding scale coverage.  Last hemoglobin A1c was 7.3 about 3 months ago.   Hypertension  amlodipine    Depression  Anxiety Zoloft      Procedures: none  Consultations: Neurology   Discharge Exam: Vitals:   09/19/24 0843 09/19/24 1218  BP: (!) 124/57 (!) 152/62  Pulse: 87 84  Resp: 16 16  Temp: 98.6 F (37 C) 97.7 F (36.5 C)  SpO2: 97% 100%   Discussed discharge with son Expressive aphasia - difficult   General: No acute distress. Cardiovascular: RRR Lungs: unlabored Abdomen: Soft, nontender, nondistended with normal active bowel sounds. No masses. No hepatosplenomegaly. Neurological: R sided weakness - expressive aphasia, occasionally  Extremities: No clubbing or cyanosis. No edema.   Discharge Instructions   Discharge Instructions     Ambulatory referral to Neurology   Complete by: As directed    An  appointment is requested in approximately: 4 weeks   Call MD for:  difficulty breathing, headache or visual disturbances   Complete by: As directed    Call MD for:  extreme fatigue   Complete by: As directed    Call MD for:  hives   Complete by: As directed    Call MD for:  persistant dizziness or light-headedness   Complete by: As directed    Call MD for:   persistant nausea and vomiting   Complete by: As directed    Call MD for:  redness, tenderness, or signs of infection (pain, swelling, redness, odor or green/yellow discharge around incision site)   Complete by: As directed    Call MD for:  severe uncontrolled pain   Complete by: As directed    Call MD for:  temperature >100.4   Complete by: As directed    Discharge instructions   Complete by: As directed    You were seen with concern for Keana Dueitt breakthrough seizure.  It was thought you may have Stachia Slutsky UTI (urine infection), but the urine was not Koby Hartfield good collection (there is no evidence of Legend Pecore UTI).  Neurology recommended increasing your keppra  to 1000 mg twice Joretta Eads daily.  Follow with neurology outpatient.  Per [place]  DMV statutes, patients with seizures are not allowed to drive until  they have been seizure-free for six months. Use caution when using heavy equipment or power tools. Avoid working on ladders or at heights. Take showers instead of baths. Ensure the water  temperature is not too high on the home water  heater. Do not go swimming alone. When caring for infants or small children, sit down when holding, feeding, or changing them to minimize risk of injury to the child in the event you have Omir Cooprider seizure. Also, Maintain good sleep hygiene. Avoid alcohol.  Return for new, recurrent, or worsening symptoms.  Please ask your PCP to request records from this hospitalization so they know what was done and what the next steps will be.   Increase activity slowly   Complete by: As directed       Allergies as of 09/19/2024       Reactions   Bovine (beef) Protein-containing Drug Products Other (See Comments)   Unknown reaction   Porcine (pork) Protein-containing Drug Products Other (See Comments)   Unknown reaction        Medication List     TAKE these medications    alendronate  70 MG tablet Commonly known as: FOSAMAX  Take 1 tablet (70 mg total) by mouth every 7 (seven) days. Take with  Juandavid Dallman full glass of water  on an empty stomach. What changed: when to take this   amLODipine -atorvastatin  5-40 MG tablet Commonly known as: CADUET  Take 1 tablet by mouth daily.   Basaglar  KwikPen 100 UNIT/ML Inject 20 Units into the skin in the morning.   cholecalciferol  25 MCG (1000 UNIT) tablet Commonly known as: VITAMIN D3 Take 1,000 Units by mouth daily.   ferrous sulfate  325 (65 FE) MG EC tablet Take 1 tablet (325 mg total) by mouth daily with breakfast.   insulin  lispro 100 UNIT/ML injection Commonly known as: HUMALOG Inject 0-10 Units into the skin 3 (three) times daily before meals. If BG is  71-150 = 0 units,  151-200 = 2 units,  201-250 = 4 units,  251-300 = 6 units,  301-350 = 8 units,  351-400 = 10 units. Notify MD if < 70 or > 400.   levETIRAcetam  1000 MG tablet  Commonly known as: KEPPRA  Take 1 tablet (1,000 mg total) by mouth 2 (two) times daily. What changed:  medication strength how much to take   NUTRITIONAL SHAKE PLUS PROTEIN PO Take 90 mLs by mouth in the morning. MED PLUS 2.0:  Give 90ml by mouth one time Shalea Tomczak day in the morning.   senna 8.6 MG Tabs tablet Commonly known as: SENOKOT Take 1 tablet by mouth every other day.   sertraline  25 MG tablet Commonly known as: ZOLOFT  Take 75 mg by mouth daily.       Allergies[1]  Contact information for after-discharge care     Destination     [facility] .   Service: Skilled Nursing Contact information: [address] [place]  [address] [phone]                      The results of significant diagnostics from this hospitalization (including imaging, microbiology, ancillary and laboratory) are listed below for reference.    Significant Diagnostic Studies: EEG adult Result Date: 09/18/2024 Shelton Arlin KIDD, MD     09/18/2024 12:28 PM Patient Name: Ramie Palladino MRN: [id number] Epilepsy Attending: Arlin KIDD Shelton Referring Physician/Provider: Perri DELENA Meliton Mickey., MD Date: 09/18/2024 Duration: 25.22 mins Patient history: 68yo F with ams. EEG to evaluate for seizure Level of alertness: Awake AEDs during EEG study: LEV Technical aspects: This EEG study was done with scalp electrodes positioned according to the 10-20 International system of electrode placement. Electrical activity was reviewed with band pass filter of 1-70Hz , sensitivity of 7 uV/mm, display speed of 28mm/sec with Taleia Sadowski 60Hz  notched filter applied as appropriate. EEG data were recorded continuously and digitally stored.  Video monitoring was available and reviewed as appropriate. Description:  The posterior dominant rhythm consists of 9 Hz activity of moderate voltage (25-35 uV) seen predominantly in posterior head regions, asymmetric ( left<right) and reactive to eye opening and eye closing. Sleep was characterized by vertex waves, sleep spindles (12 to 14 Hz), maximal frontocentral region. EEG showed continuous 3 to 5 Hz theta-delta slowing in left hemisphere. Hyperventilation and photic stimulation were not performed.    ABNORMALITY - Continuous slow,  left hemisphere  IMPRESSION: This study is suggestive of cortical dysfunction arising from left hemisphere likely secondary to underlying stroke. No seizures or epileptiform discharges were seen throughout the recording.  Priyanka O Yadav    CT Head Wo Contrast Result Date: 09/17/2024 EXAM: CT HEAD WITHOUT CONTRAST 09/17/2024 06:09:25 PM TECHNIQUE: CT of the head was performed without the administration of intravenous contrast. Automated exposure control, iterative reconstruction, and/or weight based adjustment of the mA/kV was utilized to reduce the radiation dose to as low as reasonably achievable. COMPARISON: 06/12/2024 CLINICAL HISTORY: Mental status change, unknown cause FINDINGS: BRAIN AND VENTRICLES: No acute hemorrhage. No evidence of acute infarct. Remote left MCA territory infarct. Stable generalized atrophy. Ex vacuo dilation of left  lateral ventricle. No hydrocephalus. No extra-axial collection. No mass effect or midline shift. Atherosclerotic calcifications are present within the cavernous internal carotid arteries. ORBITS: Right lens replacement. No acute abnormality. SINUSES: No acute abnormality. SOFT TISSUES AND SKULL: No acute soft tissue abnormality. No skull fracture. IMPRESSION: 1. No acute intracranial abnormality. Electronically signed by: Morgane Naveau MD 09/17/2024 06:43 PM EST RP Workstation: [id number]    Microbiology: No results found for this or any previous visit (from the past 240 hours).   Labs: Basic Metabolic Panel: Recent Labs  Lab 09/17/24 1639 09/18/24 0556  NA 138  141  K 3.7 3.8  CL 105 107  CO2 22 26  GLUCOSE 129* 173*  BUN 11 9  CREATININE 0.55 0.53  CALCIUM  9.6 9.4  MG  --  1.7   Liver Function Tests: Recent Labs  Lab 09/17/24 1639  AST 26  ALT 31  ALKPHOS 135*  BILITOT 0.2  PROT 7.7  ALBUMIN 3.9   No results for input(s): LIPASE, AMYLASE in the last 168 hours. No results for input(s): AMMONIA in the last 168 hours. CBC: Recent Labs  Lab 09/17/24 1639 09/18/24 0556 09/19/24 1113  WBC 9.9 10.8* 10.0  NEUTROABS  --   --  5.7  HGB 12.0 11.0* 11.6*  HCT 37.9 34.8* 35.4*  MCV 72.6* 72.2* 70.2*  PLT 344 294 320   Cardiac Enzymes: No results for input(s): CKTOTAL, CKMB, CKMBINDEX, TROPONINI in the last 168 hours. BNP: BNP (last 3 results) Recent Labs    05/24/24 1438  BNP 4.3    ProBNP (last 3 results) No results for input(s): PROBNP in the last 8760 hours.  CBG: Recent Labs  Lab 09/18/24 1211 09/18/24 1607 09/19/24 0045 09/19/24 0627 09/19/24 1105  GLUCAP 149* 189* 227* 170* 194*       Signed:  Meliton Monte MD.  [facility] 09/19/2024, 1:09 PM       [1]  Allergies Allergen Reactions   Bovine (Beef) Protein-Containing Drug Products Other (See Comments)    Unknown reaction   Porcine (Pork) Protein-Containing  Drug Products Other (See Comments)    Unknown reaction

## 2024-09-19 NOTE — NC FL2 (Signed)
 " [place]  MEDICAID FL2 LEVEL OF CARE FORM     IDENTIFICATION  Patient Name: Bailey [name] Birthdate: 16-Sep-1956 Sex: female Admission Date (Current Location): 09/17/2024  [facility] and [place] Number:  Producer, Television/film/video and Address:  [facility], [address] [place], [place] [address]      Provider Number: [id number]  Attending Physician Name and Address:  Perri DELENA Meliton Mickey., *  Relative Name and Phone Number:       Current Level of Care: Hospital Recommended Level of Care: Skilled Nursing Facility Prior Approval Number:    Date Approved/Denied:   PASRR Number:    Discharge Plan: SNF    Current Diagnoses: Patient Active Problem List   Diagnosis Date Noted   Breakthrough seizure (HCC) 09/18/2024   Unresponsive episode 09/17/2024   Seizures (HCC) 06/12/2024   Anemia 09/04/2023   Seizure-like activity (HCC) 09/04/2023   SIRS (systemic inflammatory response syndrome) (HCC) 09/04/2023   History of CVA (cerebrovascular accident) 09/04/2023   Middle cerebral artery embolism, 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   Acute cerebrovascular accident (CVA) due to occlusion of left carotid artery (HCC) 05/15/2022   Osteoporosis 10/16/2021   Type 2 diabetes mellitus with diabetic neuropathy, unspecified (HCC) 07/02/2021   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   Diabetic retinopathy (HCC) 02/20/2017   Fatty liver 11/24/2016   GERD (gastroesophageal reflux disease) 07/26/2015   Hyperlipidemia 07/26/2015   Dyspepsia 07/26/2015   Essential hypertension 07/26/2015   Fibrocystic breast disease 03/22/2015   Type 2 diabetes mellitus treated without insulin  (HCC) 02/16/2013    Orientation RESPIRATION BLADDER Height & Weight     Self  Normal Incontinent Weight: 174 lb 2.6 oz (79 kg) Height:  5' 5 (165.1 cm)  BEHAVIORAL SYMPTOMS/MOOD NEUROLOGICAL BOWEL NUTRITION STATUS    Convulsions/Seizures Incontinent Diet (heart healthy/carb modified)  AMBULATORY STATUS  COMMUNICATION OF NEEDS Skin   Extensive Assist Non-Verbally Normal                       Personal Care Assistance Level of Assistance  Bathing, Feeding, Dressing Bathing Assistance: Maximum assistance Feeding assistance: Limited assistance Dressing Assistance: Maximum assistance     Functional Limitations Info  Speech     Speech Info: Impaired    SPECIAL CARE FACTORS FREQUENCY                       Contractures Contractures Info: Not present    Additional Factors Info  Code Status, Allergies, Psychotropic Code Status Info: Full Allergies Info: Bovine (Beef) Protein-containing Drug Products, Porcine (Pork) Protein-containing Drug Products Psychotropic Info: Zoloft  75mg  daily         Current Medications (09/19/2024):  This is the current hospital active medication list Current Facility-Administered Medications  Medication Dose Route Frequency Provider Last Rate Last Admin   acetaminophen  (TYLENOL ) tablet 650 mg  650 mg Oral Q6H PRN Franky Redia SAILOR, MD       Or   acetaminophen  (TYLENOL ) suppository 650 mg  650 mg Rectal Q6H PRN Franky Redia SAILOR, MD       amLODipine  (NORVASC ) tablet 5 mg  5 mg Oral Daily Franky Redia SAILOR, MD   5 mg at 09/19/24 9070   And   atorvastatin  (LIPITOR) tablet 40 mg  40 mg Oral Daily Kakrakandy, Arshad N, MD   40 mg at 09/19/24 0930   insulin  aspart (novoLOG ) injection 0-9 Units  0-9 Units Subcutaneous TID WC Franky Redia SAILOR, MD  2 Units at 09/19/24 9364   insulin  glargine (LANTUS ) injection 20 Units  20 Units Subcutaneous Daily Kakrakandy, Arshad N, MD   20 Units at 09/19/24 9071   levETIRAcetam  (KEPPRA ) tablet 1,000 mg  1,000 mg Oral BID Franky Redia SAILOR, MD   1,000 mg at 09/19/24 9070   rivaroxaban  (XARELTO ) tablet 10 mg  10 mg Oral Daily Franky Redia SAILOR, MD   10 mg at 09/19/24 0930   senna (SENOKOT) tablet 8.6 mg  1 tablet Oral QODAY Kakrakandy, Arshad N, MD   8.6 mg at 09/18/24 9071   sertraline  (ZOLOFT )  tablet 75 mg  75 mg Oral Daily Perri DELENA Meliton Mickey., MD   75 mg at 09/19/24 9070     Discharge Medications: Please see discharge summary for a list of discharge medications.  Relevant Imaging Results:  Relevant Lab Results:   Additional Information SS#: [id number]  Almarie CHRISTELLA Goodie, LCSW     "

## 2024-09-19 NOTE — Evaluation (Signed)
 Occupational Therapy Evaluation Patient Details Name: Bailey [name] MRN: [id number] DOB: [birth year] Today's Date: 09/19/2024   History of Present Illness   Pt is a 68 y.o. female presenting 12/28 from [facility] with AMS. Admitted with concern for breakthrough seizure. UA with UTI. CTH negative. PMH: CVA with residual R hemiplegia, nonverbal, seizures, HTN, DM II.     Clinical Impressions PTA, pt from [facility] where staff provided set up for feeding and grooming, assist for all ADL, and transfers. Upon eval, suspect pt close to bsaeline. Pt needing up to mod A for UB ADL and total A for LB ADL. Pt able to SPT with min A +2 this session. Pt to continue to benefit from acute OT services. Patient will benefit from continued inpatient follow up therapy, <3 hours/day       If plan is discharge home, recommend the following:   A lot of help with walking and/or transfers;A lot of help with bathing/dressing/bathroom;Assistance with cooking/housework;Two people to help with walking and/or transfers;Assist for transportation;Help with stairs or ramp for entrance;Direct supervision/assist for medications management;Direct supervision/assist for financial management;Assistance with feeding     Functional Status Assessment   Patient has had a recent decline in their functional status and demonstrates the ability to make significant improvements in function in a reasonable and predictable amount of time.     Equipment Recommendations   None recommended by OT     Recommendations for Other Services         Precautions/Restrictions   Precautions Precautions: Fall;Other (comment) (chronic R hemi) Restrictions Weight Bearing Restrictions Per Provider Order: No     Mobility Bed Mobility Overal bed mobility: Needs Assistance Bed Mobility: Supine to Sit     Supine to sit: Mod assist     General bed mobility comments: mod A for RLE progression, scooting  hips out, and trunk    Transfers Overall transfer level: Needs assistance Equipment used: 2 person hand held assist Transfers: Sit to/from Stand, Bed to chair/wheelchair/BSC Sit to Stand: Min assist, +2 safety/equipment     Step pivot transfers: Min assist, +2 physical assistance, +2 safety/equipment            Balance Overall balance assessment: Needs assistance Sitting-balance support: No upper extremity supported Sitting balance-Leahy Scale: Fair     Standing balance support: Single extremity supported, During functional activity Standing balance-Leahy Scale: Poor                             ADL either performed or assessed with clinical judgement   ADL Overall ADL's : Needs assistance/impaired Eating/Feeding: Set up;Bed level   Grooming: Minimal assistance;Bed level   Upper Body Bathing: Moderate assistance;Sitting   Lower Body Bathing: Total assistance;Sitting/lateral leans   Upper Body Dressing : Moderate assistance;Sitting   Lower Body Dressing: Total assistance;Sitting/lateral leans   Toilet Transfer: Minimal assistance;+2 for physical assistance;+2 for safety/equipment;Stand-pivot   Toileting- Clothing Manipulation and Hygiene: Total assistance;+2 for safety/equipment;Sit to/from stand       Functional mobility during ADLs: Minimal assistance;+2 for physical assistance;+2 for safety/equipment       Vision Patient Visual Report: No change from baseline Additional Comments: unsure visual baseline; able to locate coffee on tray with increased time. scans L and R, tracks L and R     Perception         Praxis         Pertinent Vitals/Pain Pain Assessment Pain Assessment: No/denies  pain     Extremity/Trunk Assessment Upper Extremity Assessment Upper Extremity Assessment: RUE deficits/detail RUE Deficits / Details: chronic R hemi   Lower Extremity Assessment Lower Extremity Assessment: Defer to PT evaluation   Cervical / Trunk  Assessment Cervical / Trunk Assessment: Kyphotic   Communication Communication Communication: Impaired Factors Affecting Communication: Difficulty expressing self   Cognition Arousal: Alert Behavior During Therapy: Flat affect Cognition: No family/caregiver present to determine baseline             OT - Cognition Comments: follows one step commands. cognitive assessment limited otherwise by baseline communication deficits                 Following commands: Impaired Following commands impaired: Only follows one step commands consistently     Cueing  General Comments   Cueing Techniques: Verbal cues      Exercises     Shoulder Instructions      Home Living Family/patient expects to be discharged to:: Skilled nursing facility ([facility])                                        Prior Functioning/Environment Prior Level of Function : Needs assist             Mobility Comments: pt reports that staff assists with SPT transfers; unclear whether 1-2 person assist. ADLs Comments: per prior chart review and pt report she can feed herself once food set up for her and wash her face and brush her teeth once items set up from her. assist with all other BADL; pt reports predominantly using bed pan    OT Problem List: Decreased strength;Decreased activity tolerance;Impaired balance (sitting and/or standing);Decreased cognition;Decreased coordination;Decreased safety awareness;Decreased knowledge of use of DME or AE;Impaired tone;Impaired UE functional use   OT Treatment/Interventions: Self-care/ADL training;Therapeutic exercise;DME and/or AE instruction;Therapeutic activities;Patient/family education;Balance training;Cognitive remediation/compensation      OT Goals(Current goals can be found in the care plan section)   Acute Rehab OT Goals Patient Stated Goal: get home OT Goal Formulation: With patient Time For Goal Achievement:  10/03/24 Potential to Achieve Goals: Good   OT Frequency:  Min 1X/week    Co-evaluation              AM-PAC OT 6 Clicks Daily Activity     Outcome Measure Help from another person eating meals?: A Little Help from another person taking care of personal grooming?: A Little Help from another person toileting, which includes using toliet, bedpan, or urinal?: Total Help from another person bathing (including washing, rinsing, drying)?: A Lot Help from another person to put on and taking off regular upper body clothing?: A Lot Help from another person to put on and taking off regular lower body clothing?: Total 6 Click Score: 12   End of Session Equipment Utilized During Treatment: Gait belt Nurse Communication: Mobility status  Activity Tolerance: Patient tolerated treatment well Patient left: in chair;with call bell/phone within reach;with chair alarm set  OT Visit Diagnosis: Unsteadiness on feet (R26.81);Muscle weakness (generalized) (M62.81);Other symptoms and signs involving cognitive function;Hemiplegia and hemiparesis Hemiplegia - Right/Left: Right Hemiplegia - caused by: Cerebral infarction                Time: 0930-1000 OT Time Calculation (min): 30 min Charges:  OT General Charges $OT Visit: 1 Visit OT Evaluation $OT Eval Moderate Complexity: 1 Mod  Elma JONETTA Penner,  OTD, OTR/L [facility] Acute Rehabilitation Office: [phone]   Elma JONETTA Penner 09/19/2024, 11:00 AM

## 2024-09-19 NOTE — Plan of Care (Signed)
" °  Problem: Health Behavior/Discharge Planning: Goal: Ability to manage health-related needs will improve Outcome: Progressing   Problem: Nutritional: Goal: Maintenance of adequate nutrition will improve Outcome: Progressing   Problem: Education: Goal: Knowledge of General Education information will improve Description: Including pain rating scale, medication(s)/side effects and non-pharmacologic comfort measures Outcome: Progressing   Problem: Clinical Measurements: Goal: Will remain free from infection Outcome: Progressing   Problem: Activity: Goal: Risk for activity intolerance will decrease Outcome: Progressing   Problem: Safety: Goal: Ability to remain free from injury will improve Outcome: Progressing   "

## 2024-09-19 NOTE — TOC Transition Note (Signed)
 Transition of Care [facility]) - Discharge Note   Patient Details  Name: Bailey [name] MRN: [id number] Date of Birth: 1956-06-01  Transition [facility]) CM/SW Contact:  Almarie CHRISTELLA Goodie, LCSW Phone Number: 09/19/2024, 1:40 PM   Clinical Narrative:   Patient is LTC resident at [facility], stable for discharge back today. CSW spoke with son, Darerro, confirmed plan and he is in agreement. CSW sent discharge information to [facility], confirmed receipt. Transport arranged with [facility] for next available.  Nurse to call report to [phone], Room 102A.    Final next level of care: Skilled Nursing Facility Barriers to Discharge: Barriers Resolved   Patient Goals and CMS Choice            Discharge Placement              Patient chooses bed at: [facility] Patient to be transferred to facility by: [facility] Name of family member notified: Darerro Patient and family notified of of transfer: 09/19/24  Discharge Plan and Services Additional resources added to the After Visit Summary for                                       Social Drivers of Health (SDOH) Interventions SDOH Screenings   Food Insecurity: No Food Insecurity (09/18/2024)  Housing: Low Risk (09/18/2024)  Transportation Needs: No Transportation Needs (09/18/2024)  Utilities: Not At Risk (09/18/2024)  Alcohol Screen: Low Risk (10/23/2021)  Depression (PHQ2-9): Low Risk (10/23/2021)  Financial Resource Strain: Low Risk (10/23/2021)  Physical Activity: Inactive (10/23/2021)  Social Connections: Unknown (09/18/2024)  Stress: Stress Concern Present (10/23/2021)  Tobacco Use: High Risk (09/19/2024)     Readmission Risk Interventions     No data to display

## 2024-09-19 NOTE — Evaluation (Signed)
 Physical Therapy Evaluation Patient Details Name: Bailey [name] MRN: [id number] DOB: April 22, 1956 Today's Date: 09/19/2024  History of Present Illness  Pt is a 68 y.o. female presenting 12/28 from [facility] with AMS. Admitted with concern for breakthrough seizure. UA with UTI. CTH negative. PMH: CVA with residual R hemiplegia, nonverbal, seizures, HTN, DM II.  Clinical Impression  Pt presents with admitting diagnosis above. Co-treat with OT. Pt today was able to step pivot into recliner with +2 Mod A via HHA. Pt noted to be incontinent of bowels and majority of session focused on pericare. Pt able to stand up to 3 minutes for pericare. PTA pt resides at [facility] where she is WC bound requiring staff assistance to transfer. Pt appears to be fairly close to baseline and recommend pt return to SNF upon DC. PT will continue to follow.         If plan is discharge home, recommend the following: Assistance with cooking/housework;Direct supervision/assist for medications management;Assist for transportation;Help with stairs or ramp for entrance;A lot of help with walking and/or transfers;A lot of help with bathing/dressing/bathroom   Can travel by private vehicle   No    Equipment Recommendations None recommended by PT  Recommendations for Other Services       Functional Status Assessment Patient has had a recent decline in their functional status and demonstrates the ability to make significant improvements in function in a reasonable and predictable amount of time.     Precautions / Restrictions Precautions Precautions: Fall;Other (comment) (chronic R hemi) Restrictions Weight Bearing Restrictions Per Provider Order: No      Mobility  Bed Mobility Overal bed mobility: Needs Assistance Bed Mobility: Supine to Sit     Supine to sit: Mod assist     General bed mobility comments: mod A for RLE progression, scooting hips out, and trunk    Transfers Overall transfer level:  Needs assistance Equipment used: 2 person hand held assist Transfers: Sit to/from Stand, Bed to chair/wheelchair/BSC Sit to Stand: Min assist, +2 safety/equipment   Step pivot transfers: Min assist, +2 physical assistance, +2 safety/equipment            Ambulation/Gait               General Gait Details: WC bound at baseline  Stairs            Wheelchair Mobility     Tilt Bed    Modified Rankin (Stroke Patients Only) Modified Rankin (Stroke Patients Only) Pre-Morbid Rankin Score: Severe disability Modified Rankin: Severe disability     Balance Overall balance assessment: Needs assistance Sitting-balance support: No upper extremity supported Sitting balance-Leahy Scale: Fair     Standing balance support: Single extremity supported, During functional activity Standing balance-Leahy Scale: Poor                               Pertinent Vitals/Pain Pain Assessment Pain Assessment: No/denies pain    Home Living Family/patient expects to be discharged to:: Skilled nursing facility ([facility])                        Prior Function Prior Level of Function : Needs assist             Mobility Comments: pt reports that staff assists with SPT transfers; unclear whether 1-2 person assist. ADLs Comments: per prior chart review and pt report she can feed herself once  food set up for her and wash her face and brush her teeth once items set up from her. assist with all other BADL; pt reports predominantly using bed pan     Extremity/Trunk Assessment   Upper Extremity Assessment Upper Extremity Assessment: RUE deficits/detail RUE Deficits / Details: chronic R hemi    Lower Extremity Assessment Lower Extremity Assessment: RLE deficits/detail RLE Deficits / Details: Chronic R hemi    Cervical / Trunk Assessment Cervical / Trunk Assessment: Kyphotic  Communication   Communication Communication: Impaired Factors Affecting  Communication: Difficulty expressing self    Cognition Arousal: Alert Behavior During Therapy: Flat affect                             Following commands: Impaired Following commands impaired: Only follows one step commands consistently     Cueing Cueing Techniques: Verbal cues     General Comments General comments (skin integrity, edema, etc.): VSS    Exercises     Assessment/Plan    PT Assessment Patient needs continued PT services  PT Problem List Decreased strength;Decreased range of motion;Decreased activity tolerance;Decreased balance;Decreased mobility;Decreased coordination;Decreased cognition;Decreased knowledge of use of DME;Decreased safety awareness;Cardiopulmonary status limiting activity;Decreased knowledge of precautions       PT Treatment Interventions DME instruction;Gait training;Stair training;Therapeutic activities;Functional mobility training;Therapeutic exercise;Balance training;Neuromuscular re-education;Cognitive remediation;Patient/family education    PT Goals (Current goals can be found in the Care Plan section)  Acute Rehab PT Goals PT Goal Formulation: Patient unable to participate in goal setting Time For Goal Achievement: 10/03/24 Potential to Achieve Goals: Good    Frequency Min 2X/week     Co-evaluation               AM-PAC PT 6 Clicks Mobility  Outcome Measure Help needed turning from your back to your side while in a flat bed without using bedrails?: A Lot Help needed moving from lying on your back to sitting on the side of a flat bed without using bedrails?: A Lot Help needed moving to and from a bed to a chair (including a wheelchair)?: A Lot Help needed standing up from a chair using your arms (e.g., wheelchair or bedside chair)?: A Lot Help needed to walk in hospital room?: Total Help needed climbing 3-5 steps with a railing? : Total 6 Click Score: 10    End of Session Equipment Utilized During Treatment:  Gait belt Activity Tolerance: Patient tolerated treatment well Patient left: with call bell/phone within reach;in chair;with chair alarm set Nurse Communication: Mobility status PT Visit Diagnosis: Other abnormalities of gait and mobility (R26.89)    Time: 9064-8997 PT Time Calculation (min) (ACUTE ONLY): 27 min   Charges:   PT Evaluation $PT Eval Moderate Complexity: 1 Mod   PT General Charges $$ ACUTE PT VISIT: 1 Visit         Sueellen NOVAK, PT, DPT Acute Rehab Services [phone]   Ansleigh Safer 09/19/2024, 12:04 PM
